# Patient Record
Sex: Female | Born: 1935 | Race: White | Hispanic: No | Marital: Married | Smoking: Former smoker
Health system: Southern US, Community
[De-identification: ages and names within clinical notes are randomized; demographics above are authoritative.]

## PROBLEM LIST (undated history)

## (undated) DIAGNOSIS — R079 Chest pain, unspecified: Secondary | ICD-10-CM

## (undated) DIAGNOSIS — K9419 Other complications of enterostomy: Secondary | ICD-10-CM

## (undated) DIAGNOSIS — M1990 Unspecified osteoarthritis, unspecified site: Secondary | ICD-10-CM

## (undated) DIAGNOSIS — M069 Rheumatoid arthritis, unspecified: Secondary | ICD-10-CM

## (undated) DIAGNOSIS — E7800 Pure hypercholesterolemia, unspecified: Secondary | ICD-10-CM

## (undated) DIAGNOSIS — I2510 Atherosclerotic heart disease of native coronary artery without angina pectoris: Secondary | ICD-10-CM

## (undated) DIAGNOSIS — M519 Unspecified thoracic, thoracolumbar and lumbosacral intervertebral disc disorder: Secondary | ICD-10-CM

## (undated) DIAGNOSIS — J189 Pneumonia, unspecified organism: Secondary | ICD-10-CM

## (undated) DIAGNOSIS — F32A Depression, unspecified: Secondary | ICD-10-CM

## (undated) DIAGNOSIS — IMO0001 Reserved for inherently not codable concepts without codable children: Secondary | ICD-10-CM

## (undated) DIAGNOSIS — K219 Gastro-esophageal reflux disease without esophagitis: Secondary | ICD-10-CM

## (undated) DIAGNOSIS — I10 Essential (primary) hypertension: Secondary | ICD-10-CM

## (undated) DIAGNOSIS — N390 Urinary tract infection, site not specified: Secondary | ICD-10-CM

## (undated) DIAGNOSIS — F329 Major depressive disorder, single episode, unspecified: Secondary | ICD-10-CM

## (undated) DIAGNOSIS — Z5189 Encounter for other specified aftercare: Secondary | ICD-10-CM

## (undated) DIAGNOSIS — T8092XA Unspecified transfusion reaction, initial encounter: Secondary | ICD-10-CM

## (undated) DIAGNOSIS — R0602 Shortness of breath: Secondary | ICD-10-CM

## (undated) DIAGNOSIS — D649 Anemia, unspecified: Secondary | ICD-10-CM

## (undated) DIAGNOSIS — I739 Peripheral vascular disease, unspecified: Secondary | ICD-10-CM

## (undated) DIAGNOSIS — Z8719 Personal history of other diseases of the digestive system: Secondary | ICD-10-CM

## (undated) DIAGNOSIS — S82402A Unspecified fracture of shaft of left fibula, initial encounter for closed fracture: Secondary | ICD-10-CM

## (undated) DIAGNOSIS — T4145XA Adverse effect of unspecified anesthetic, initial encounter: Secondary | ICD-10-CM

## (undated) DIAGNOSIS — E1340 Other specified diabetes mellitus with diabetic neuropathy, unspecified: Secondary | ICD-10-CM

## (undated) DIAGNOSIS — F419 Anxiety disorder, unspecified: Secondary | ICD-10-CM

## (undated) HISTORY — DX: Chest pain, unspecified: R07.9

## (undated) HISTORY — PX: COLONOSCOPY: SHX174

## (undated) HISTORY — PX: CARDIAC CATHETERIZATION: SHX172

## (undated) HISTORY — PX: DILATION AND CURETTAGE OF UTERUS: SHX78

## (undated) HISTORY — PX: ABDOMINAL HYSTERECTOMY: SHX81

## (undated) HISTORY — PX: CATARACT EXTRACTION W/ INTRAOCULAR LENS  IMPLANT, BILATERAL: SHX1307

## (undated) HISTORY — PX: TONSILLECTOMY: SUR1361

## (undated) HISTORY — PX: BACK SURGERY: SHX140

## (undated) NOTE — *Deleted (*Deleted)
DEATH SUMMARY   Patient Details  Name: Patricia Villegas MRN: [id number] DOB: 02-Feb-1935  Admission/Discharge Information   Admit Date:  08/09/2020  Date of Death: Date of Death: 26-Aug-2020  Time of Death: Time of Death: 1600  Length of Stay: February 22, 2023  Referring Physician: Gaspar Garbe, MD   Reason(s) for Hospitalization  {Trauma:[id number]::"***"}  Diagnoses  Preliminary cause of death:  Secondary Diagnoses (including complications and co-morbidities):  Principal Problem:   Rhabdomyolysis Active Problems:   Essential hypertension   Mixed hyperlipidemia   Peripheral vascular disease (HCC)   CAD (coronary artery disease) of artery bypass graft   Rheumatoid arthritis (HCC)   Chronic diastolic CHF (congestive heart failure) (HCC)   Type 2 diabetes mellitus with diabetic polyneuropathy, with long-term current use of insulin (HCC)   Myositis   Constipation   LFT elevation   Malnutrition of moderate degree   Diffuse pain   Severe muscle deconditioning   Goals of care, counseling/discussion   End of life care   DNR (do not resuscitate)   Palliative care by specialist   Dyspnea   Brief Hospital Course (including significant findings, care, treatment, and services provided and events leading to death)  Patricia Villegas is a 86 y.o. year old female who ***    Pertinent Labs and Studies  Significant Diagnostic Studies CT ABDOMEN PELVIS W CONTRAST  Result Date: 07/16/2020 CLINICAL DATA:  Anemia EXAM: CT ABDOMEN AND PELVIS WITH CONTRAST TECHNIQUE: Multidetector CT imaging of the abdomen and pelvis was performed using the standard protocol following bolus administration of intravenous contrast. CONTRAST:  OMNIPAQUE IOHEXOL 300 MG/ML  SOLN COMPARISON:  07/16/2020, 07/12/2020 FINDINGS: Lower chest: There are small bilateral pleural effusions. New right lower lobe airspace disease which could reflect aspiration or pneumonia. Minimal dependent lower lobe atelectasis. Dense calcification  of the mitral valve unchanged. Hepatobiliary: Calcified gallstones are again identified without cholecystitis. Liver is unremarkable. Pancreas: Unremarkable. No pancreatic ductal dilatation or surrounding inflammatory changes. Spleen: Normal in size without focal abnormality. Adrenals/Urinary Tract: Stable right renal cyst. Otherwise the kidneys enhance normally. The adrenals and bladder are unremarkable. Stomach/Bowel: Dense oral contrast is seen throughout the colon. No bowel obstruction or ileus. No wall thickening or inflammatory change. Vascular/Lymphatic: Aortic atherosclerosis. No enlarged abdominal or pelvic lymph nodes. Reproductive: Status post hysterectomy. No adnexal masses. Other: No free fluid or free gas.  No abdominal wall hernia. Musculoskeletal: Chronic compression deformities are seen at T11 and L1. Stable postsurgical changes at the L4/L5 level. Bilateral hip arthroplasties are stable. There are no acute bony abnormalities. Reconstructed images demonstrate no additional findings. IMPRESSION: 1. Right lower lobe airspace disease which could reflect infection or aspiration. 2. Stable small bilateral pleural effusions. 3. Cholelithiasis without cholecystitis. 4.  Aortic Atherosclerosis (ICD10-I70.0). Electronically Signed   By: Sharlet Salina M.D.   On: 07/16/2020 23:42   CT ABDOMEN PELVIS W CONTRAST  Result Date: 07/12/2020 CLINICAL DATA:  Abdominal distention EXAM: CT ABDOMEN AND PELVIS WITH CONTRAST TECHNIQUE: Multidetector CT imaging of the abdomen and pelvis was performed using the standard protocol following bolus administration of intravenous contrast. CONTRAST:  OMNIPAQUE IOHEXOL 300 MG/ML  SOLN COMPARISON:  None. FINDINGS: Lower chest: Trace bilateral pleural effusions. Dependent atelectasis. Densely calcified mitral valve. Hepatobiliary: Small gallstones layering within the gallbladder. No focal hepatic abnormality. Pancreas: No focal abnormality or ductal dilatation. Spleen: No  focal abnormality.  Normal size. Adrenals/Urinary Tract: Small cyst off the upper pole of the right kidney. No hydronephrosis. No renal or ureteral  stones. Urinary bladder and adrenal glands unremarkable. Stomach/Bowel: Few scattered left colonic diverticula. No active diverticulitis. Stomach and small bowel decompressed. No bowel obstruction. Vascular/Lymphatic: Aortic atherosclerosis. No evidence of aneurysm or adenopathy. Reproductive: Prior hysterectomy.  No adnexal masses. Other: No free fluid or free air. Musculoskeletal: Bilateral hip replacements. Postoperative changes in the lower lumbar spine. Mild compression fracture through the inferior endplate of L1 and superior endplate of T11. IMPRESSION: Trace bilateral pleural effusions.  Bibasilar atelectasis. Cholelithiasis. Aortic atherosclerosis. No acute findings in the abdomen or pelvis. Electronically Signed   By: Charlett Nose M.D.   On: 07/12/2020 02:14   VAS [place] IVC/ILIAC (VENOUS ONLY)  Result Date: 07/03/2020 IVC/ILIAC STUDY Limitations: Air/bowel gas.  Comparison Study: 06/09/2020- negative right lower extremity venous duplex Performing Technologist: Gertie Fey RDMS, RVT, RDCS  Examination Guidelines: A complete evaluation includes B-mode imaging, spectral Doppler, color Doppler, and power Doppler as needed of all accessible portions of each vessel. Bilateral testing is considered an integral part of a complete examination. Limited examinations for reoccurring indications may be performed as noted.  +----------------+---------+-----------+---------+-----------+--------+       CIV       RT-PatentRT-ThrombusLT-PatentLT-ThrombusComments +----------------+---------+-----------+---------+-----------+--------+ Common Iliac Mid patent              patent                      +----------------+---------+-----------+---------+-----------+--------+  +-----------------------+---------+-----------+---------+-----------+--------+            EIV          RT-PatentRT-ThrombusLT-PatentLT-ThrombusComments +-----------------------+---------+-----------+---------+-----------+--------+ External Iliac Vein Mid patent              patent                      +-----------------------+---------+-----------+---------+-----------+--------+   Summary: IVC/Iliac: No evidence of thrombus in common and external iliac veins. Unable to visualize IVC.  *See table(s) above for measurements and observations.  Electronically signed by Fabienne Bruns MD on 07/03/2020 at 6:20:18 PM.   Final    DG CHEST PORT 1 VIEW  Result Date: 07/19/2020 CLINICAL DATA:  Shortness of breath. EXAM: PORTABLE CHEST 1 VIEW COMPARISON:  November 9 21 FINDINGS: Similar cardiomediastinal silhouette. Postsurgical change with median sternotomy. Aortic atherosclerosis. Similar positioning of the right PICC. Enteric tube courses below the diaphragm and outside the field of view. Similar right basilar airspace opacities. No acute osseous abnormality. IMPRESSION: Similar right basilar airspace opacities, compatible with pneumonia and/or aspiration. Electronically Signed   By: Feliberto Harts MD   On: 07/19/2020 10:11   DG CHEST PORT 1 VIEW  Result Date: 07/18/2020 CLINICAL DATA:  Shortness of breath EXAM: PORTABLE CHEST 1 VIEW COMPARISON:  07/15/2020 FINDINGS: Cardiac shadow is stable. Postsurgical changes are noted. Aortic calcifications are seen. Feeding catheter extends at least into the distal stomach. Significant increased airspace opacity is noted in the right base consistent with acute pneumonia. No sizable effusion is seen. No bony abnormality is noted. IMPRESSION: Findings consistent with acute right basilar pneumonia. Electronically Signed   By: Alcide Clever M.D.   On: 07/18/2020 13:57   DG CHEST PORT 1 VIEW  Result Date: 07/15/2020 CLINICAL DATA:  Short of breath. EXAM: PORTABLE CHEST 1 VIEW COMPARISON:  07/13/2020 FINDINGS: Previous median sternotomy and CABG  procedure. Aortic atherosclerotic calcifications. Mitral annular calcifications. Bilateral pleural effusions are noted, right greater than left. Pulmonary vascular congestion and trace edema. No airspace densities. IMPRESSION: 1. Mild congestive heart failure. 2.  Aortic Atherosclerosis (ICD10-I70.0).  Electronically Signed   By: Signa Kell M.D.   On: 07/15/2020 11:19   DG CHEST PORT 1 VIEW  Result Date: 07/13/2020 CLINICAL DATA:  Shortness of breath.  Weakness. EXAM: PORTABLE CHEST 1 VIEW COMPARISON:  07/01/2020 FINDINGS: Midline trachea. Mild cardiomegaly. Atherosclerosis in the transverse aorta. Mitral annular calcifications. Median sternotomy for CABG. No pleural fluid. Biapical pleuroparenchymal scarring. No congestive failure. Mild scarring or atelectasis at the lateral left lung base. IMPRESSION: Cardiomegaly, without congestive failure. Aortic Atherosclerosis (ICD10-I70.0). Electronically Signed   By: Jeronimo Greaves M.D.   On: 07/13/2020 10:41   DG Abd Portable 1V  Result Date: 07/18/2020 CLINICAL DATA:  Constipation, check feeding catheter placement EXAM: PORTABLE ABDOMEN - 1 VIEW COMPARISON:  07/17/2019 FINDINGS: Feeding catheter is noted within the distal stomach/proximal duodenum. Contrast material is noted throughout the colon similar to that seen on the prior exam. Postsurgical changes are noted. No significant findings of constipation are seen. No free air is noted. IMPRESSION: Feeding catheter as described. No other focal abnormality is seen. Electronically Signed   By: Alcide Clever M.D.   On: 07/18/2020 13:58   DG Abd Portable 1V  Result Date: 07/16/2020 CLINICAL DATA:  Constipation EXAM: PORTABLE ABDOMEN - 1 VIEW COMPARISON:  CT dated July 12, 2020 FINDINGS: Oral contrast is noted in the colon. The bowel gas pattern is nonobstructive. There are degenerative changes of the spine. The patient is status post prior total hip arthroplasty. IMPRESSION: Oral contrast is noted in the colon.  Nonobstructive bowel gas pattern. Electronically Signed   By: Katherine Mantle M.D.   On: 07/16/2020 18:57   DG Swallowing Func-Speech Pathology  Result Date: 07/13/2020 Objective Swallowing Evaluation: Type of Study: MBS-Modified Barium Swallow Study  Patient Details Name: ELLAR HAKALA MRN: [id number] Date of Birth: 05-06-1935 Today's Date: 07/13/2020 Time: SLP Start Time (ACUTE ONLY): 1232 -SLP Stop Time (ACUTE ONLY): 1253 SLP Time Calculation (min) (ACUTE ONLY): 21 min Past Medical History: Past Medical History: Diagnosis Date . Anemia  . Anemia 06/14/2020 . Anxiety  . Arthritis  . Blood transfusion  . Chest pain   a. Lexiscan Myoview 12/12:  EF 75%, no ischemia or scar;  b.  Echo 12/12:  EF 60-65%, no sig valvular abnormalities  . Closed left fibular fracture   Casted  . Complication of anesthesia 1998  Unable to arouse fully, respiratory depression, pt. reports that the family was told that she would need a trach., but then she stabilized & didn't need it  . Coronary artery disease   sees Dr Shirlee Latch every 6 months . Depression  . Diabetes mellitus   Greater than 20 years type 2  . GERD (gastroesophageal reflux disease)  . H/O hiatal hernia  . Hypercholesteremia  . Hypertension   Greater than 20 years . Lumbar disc disease   S/p surgery x4;  Sciatica  . Neuropathy due to secondary diabetes (HCC)   Hx: of . Parastomal hernia of ileal conduit 2009 . Pneumonia   s/p CABG . [name] (peripheral vascular disease) (HCC)   Stable claudication. Arteriogram 07/2009 no significant aortoiliac disease, has bilateral SFA occlusions with distal reconstitution  . Rheumatoid arthritis (HCC)   Dr Orlin Hilding @ [facility] . Transfusion reaction 1960  Convulsions (? seizure) . UTI (lower urinary tract infection) 01/03/15 Past Surgical History: Past Surgical History: Procedure Laterality Date . ABDOMINAL HYSTERECTOMY   . BACK SURGERY    4 back operations most recently 1995, 1998 . BIOPSY  06/17/2020  Procedure: BIOPSY;  Surgeon: Benancio Deeds, MD;  Location: [facility] ENDOSCOPY;  Service: Gastroenterology;; . CARDIAC CATHETERIZATION    Hx: of 05/14/13 . CATARACT EXTRACTION W/ INTRAOCULAR LENS  IMPLANT, BILATERAL    Hx: of . COLONOSCOPY    Hx; of . COLONOSCOPY WITH PROPOFOL N/A 06/17/2020  Procedure: COLONOSCOPY WITH PROPOFOL;  Surgeon: Benancio Deeds, MD;  Location: [facility] ENDOSCOPY;  Service: Gastroenterology;  Laterality: N/A; . CORONARY ARTERY BYPASS GRAFT N/A 05/24/2013  Procedure: CORONARY ARTERY BYPASS GRAFTING (CABG);  Surgeon: Loreli Slot, MD;  Location: [facility] OR;  Service: Open Heart Surgery;  Laterality: N/A; . DILATION AND CURETTAGE OF UTERUS   . ESOPHAGOGASTRODUODENOSCOPY (EGD) WITH PROPOFOL N/A 06/17/2020  Procedure: ESOPHAGOGASTRODUODENOSCOPY (EGD) WITH PROPOFOL;  Surgeon: Benancio Deeds, MD;  Location: [facility] ENDOSCOPY;  Service: Gastroenterology;  Laterality: N/A; . HEMOSTASIS CLIP PLACEMENT  06/17/2020  Procedure: HEMOSTASIS CLIP PLACEMENT;  Surgeon: Benancio Deeds, MD;  Location: [facility] ENDOSCOPY;  Service: Gastroenterology;; . HERNIA REPAIR  2009 . LEFT HEART CATHETERIZATION WITH CORONARY ANGIOGRAM N/A 05/14/2013  Procedure: LEFT HEART CATHETERIZATION WITH CORONARY ANGIOGRAM;  Surgeon: Laurey Morale, MD;  Location: [facility] CATH LAB;  Service: Cardiovascular;  Laterality: N/A; . POLYPECTOMY  06/17/2020  Procedure: POLYPECTOMY;  Surgeon: Benancio Deeds, MD;  Location: [facility] ENDOSCOPY;  Service: Gastroenterology;; . TONSILLECTOMY   . TOTAL HIP ARTHROPLASTY Left 01/10/2015  Procedure: TOTAL HIP ARTHROPLASTY ANTERIOR APPROACH;  Surgeon: Marcene Corning, MD;  Location: [facility] OR;  Service: Orthopedics;  Laterality: Left; . TOTAL HIP ARTHROPLASTY Right 05/30/2020  Procedure: RIGHT TOTAL HIP ARTHROPLASTY ANTERIOR APPROACH;  Surgeon: Marcene Corning, MD;  Location: [facility] ORS;  Service: Orthopedics;  Laterality: Right; HPI: Pt is an 74 y.o. female with medical history significant of RA, [name], DM, HTN, [name], and CAD s/p CABG who  presented with leg pain.  She had physical therapy sessions and became very weak came back to the hospital was found to have a bleeding ulcer and a DVT. Per neurology's note, pt has had an insidious slow progressive onset of weakness for 3-4 weeks concerning for myopathy. SLP was consulted since dysphagia may be related to myopathy.  No data recorded Assessment / Plan / Recommendation CHL IP CLINICAL IMPRESSIONS 07/13/2020 Clinical Impression Pt's positioning was suboptimal due to her inability to hold her head in an upright position d/t reported weakness. Without physical assistance from SLP and radiology tech, pt maintained a chin tuck posture. Pt exhibited difficulty with bolus propulsion, increased pharyngeal residue, and absent epiglottic inversion in this position, all increasing aspiration risk. Pt presented with oropharyngeal dysphagia characterized by reduced lingual retraction, reduced anterior laryngeal movement, reduced pharyngeal constriction, and aspiration (PAS 7) of nectar thick liquids secondary to spillover of pyriform sinus residue and delayed swallow initiation. Laryngeal sensation was adequate but coughing was ineffective. She demonstrated moderate vallecular residue, mild posterior pharyngeal wall residue, and mild-moderate pyriform sinus residue. A barium tablet was attempted, but pt refused stating that she needs her pills to be crushed since she is fearful of "choking" on them. Pt stated that she needs her meats to be cut "smaller than a fingernail" for her to be able to masticate them. Pt's diet will be downgraded to dysphagia 2 solids and thin liquids. 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 With support to keep her head in an upright position, her swallow function was improved and risk reduced. The results of the study  and the potential use a soft cervical collar  to help support pt's neck, and therefore facilitate use of an upright head position, were discussed with Dr. Allena Katz and he indicated that he will order it. SLP will follow pt. SLP Visit Diagnosis Dysphagia, unspecified (R13.10) Attention and concentration deficit following -- Frontal lobe and executive function deficit following -- Impact on safety and function Mild aspiration risk   CHL IP TREATMENT RECOMMENDATION 07/13/2020 Treatment Recommendations Therapy as outlined in treatment plan below   Prognosis 07/13/2020 Prognosis for Safe Diet Advancement Good Barriers to Reach Goals Severity of deficits Barriers/Prognosis Comment -- CHL IP DIET RECOMMENDATION 07/13/2020 SLP Diet Recommendations Dysphagia 2 (Fine chop) solids;Thin liquid Liquid Administration via Cup;Straw Medication Administration Crushed with puree Compensations Slow rate;Small sips/bites Postural Changes Seated upright at 90 degrees   CHL IP OTHER RECOMMENDATIONS 07/13/2020 Recommended Consults -- Oral Care Recommendations Oral care BID;Staff/trained caregiver to provide oral care Other Recommendations --   CHL IP FOLLOW UP RECOMMENDATIONS 07/13/2020 Follow up Recommendations (No Data)   CHL IP FREQUENCY AND DURATION 07/13/2020 Speech Therapy Frequency (ACUTE ONLY) min 2x/week Treatment Duration 2 weeks      CHL IP ORAL PHASE 07/13/2020 Oral Phase Impaired Oral - Pudding Teaspoon -- Oral - Pudding Cup -- Oral - Honey Teaspoon -- Oral - Honey Cup Reduced posterior propulsion Oral - Nectar Teaspoon -- Oral - Nectar Cup Reduced posterior propulsion Oral - Nectar Straw Reduced posterior propulsion Oral - Thin Teaspoon -- Oral - Thin Cup -- Oral - Thin Straw Reduced posterior propulsion Oral - Puree Reduced posterior propulsion Oral - Mech Soft Reduced posterior propulsion Oral - Regular Reduced posterior propulsion Oral - Multi-Consistency -- Oral - Pill -- Oral Phase - Comment --  CHL IP PHARYNGEAL PHASE 07/13/2020 Pharyngeal Phase Impaired Pharyngeal- Pudding  Teaspoon -- Pharyngeal -- Pharyngeal- Pudding Cup -- Pharyngeal -- Pharyngeal- Honey Teaspoon -- Pharyngeal -- Pharyngeal- Honey Cup Pharyngeal residue - valleculae;Pharyngeal residue - pyriform;Pharyngeal residue - posterior pharnyx;Reduced anterior laryngeal mobility;Reduced epiglottic inversion Pharyngeal -- Pharyngeal- Nectar Teaspoon -- Pharyngeal -- Pharyngeal- Nectar Cup -- Pharyngeal -- Pharyngeal- Nectar Straw Pharyngeal residue - valleculae;Pharyngeal residue - pyriform;Pharyngeal residue - posterior pharnyx;Reduced anterior laryngeal mobility;Reduced epiglottic inversion;Moderate aspiration;Penetration/Aspiration during swallow;Penetration/Apiration after swallow Pharyngeal Material enters airway, passes BELOW cords and not ejected out despite cough attempt by patient Pharyngeal- Thin Teaspoon -- Pharyngeal -- Pharyngeal- Thin Cup -- Pharyngeal -- Pharyngeal- Thin Straw Pharyngeal residue - valleculae;Pharyngeal residue - pyriform;Pharyngeal residue - posterior pharnyx;Reduced anterior laryngeal mobility;Reduced epiglottic inversion Pharyngeal -- Pharyngeal- Puree Pharyngeal residue - valleculae;Pharyngeal residue - pyriform;Pharyngeal residue - posterior pharnyx;Reduced anterior laryngeal mobility;Reduced epiglottic inversion Pharyngeal -- Pharyngeal- Mechanical Soft Pharyngeal residue - valleculae;Pharyngeal residue - pyriform;Pharyngeal residue - posterior pharnyx;Reduced anterior laryngeal mobility;Reduced epiglottic inversion Pharyngeal -- Pharyngeal- Regular Pharyngeal residue - valleculae;Pharyngeal residue - pyriform;Pharyngeal residue - posterior pharnyx;Reduced anterior laryngeal mobility;Reduced epiglottic inversion Pharyngeal -- Pharyngeal- Multi-consistency -- Pharyngeal -- Pharyngeal- Pill -- Pharyngeal -- Pharyngeal Comment --  Shanika I. Vear Clock, MS, CCC-SLP Acute Rehabilitation Services Office number [phone] Pager [phone] No flowsheet data found. Scheryl Marten 07/13/2020,  3:11 PM              US LIVER DOPPLER  Result Date: 07/13/2020 CLINICAL DATA:  Elevated LFTs EXAM: DUPLEX ULTRASOUND OF LIVER TECHNIQUE: Color and duplex Doppler ultrasound was performed to evaluate the hepatic in-flow and out-flow vessels. COMPARISON:  07/12/2020 FINDINGS: Liver: Normal parenchymal echogenicity. Normal hepatic contour without nodularity. No focal lesion, mass or intrahepatic biliary ductal dilatation. Main Portal  Vein size: 1.0 cm Portal Vein Velocities Main Prox:  54 cm/sec Main Mid: 51 cm/sec Main Dist:  45 cm/sec Right: 34 cm/sec Left: 22 cm/sec Hepatic Vein Velocities Right:  22 cm/sec Middle:  32 cm/sec Left:  40 cm/sec IVC: Present and patent with normal respiratory phasicity. Hepatic Artery Velocity:  57 cm/sec Splenic Vein Velocity:  19 cm/sec Spleen: 2.6 cm x 6.8 cm x 7.4 cm with a total volume of 70 cm^3 (411 cm^3 is upper limit normal) Portal Vein Occlusion/Thrombus: No Splenic Vein Occlusion/Thrombus: No Ascites: None Varices: None Patent portal, hepatic and splenic veins with directional flow. Negative portal vein occlusion or thrombus. No ascites. Small left effusion noted. IMPRESSION: Normal hepatic venous Doppler. Electronically Signed   By: Judie Petit.  Shick M.D.   On: 07/13/2020 08:24   VAS [place] LOWER EXTREMITY VENOUS (DVT)  Result Date: 07/12/2020  Lower Venous DVT Study Indications: Follow up dvt.  Comparison Study: 07/04/20 previous Performing Technologist: Blanch Media RVS  Examination Guidelines: A complete evaluation includes B-mode imaging, spectral Doppler, color Doppler, and power Doppler as needed of all accessible portions of each vessel. Bilateral testing is considered an integral part of a complete examination. Limited examinations for reoccurring indications may be performed as noted. The reflux portion of the exam is performed with the patient in reverse Trendelenburg.  +---------+---------------+---------+-----------+----------+--------------+ RIGHT     CompressibilityPhasicitySpontaneityPropertiesThrombus Aging +---------+---------------+---------+-----------+----------+--------------+ CFV      Full           Yes      Yes                                 +---------+---------------+---------+-----------+----------+--------------+ SFJ      Full                                                        +---------+---------------+---------+-----------+----------+--------------+ FV Prox  Full                                                        +---------+---------------+---------+-----------+----------+--------------+ FV Mid   Full                                                        +---------+---------------+---------+-----------+----------+--------------+ FV DistalFull                                                        +---------+---------------+---------+-----------+----------+--------------+ PFV      Full                                                        +---------+---------------+---------+-----------+----------+--------------+ POP  Full           Yes      Yes                                 +---------+---------------+---------+-----------+----------+--------------+ PTV      Full                                                        +---------+---------------+---------+-----------+----------+--------------+ PERO     Full                                                        +---------+---------------+---------+-----------+----------+--------------+   +---------+---------------+---------+-----------+----------+-----------------+ LEFT     CompressibilityPhasicitySpontaneityPropertiesThrombus Aging    +---------+---------------+---------+-----------+----------+-----------------+ CFV      Full           Yes      Yes                                    +---------+---------------+---------+-----------+----------+-----------------+ SFJ      Full                                                            +---------+---------------+---------+-----------+----------+-----------------+ FV Prox  Full                                                           +---------+---------------+---------+-----------+----------+-----------------+ FV Mid   Full                                                           +---------+---------------+---------+-----------+----------+-----------------+ FV DistalFull                                                           +---------+---------------+---------+-----------+----------+-----------------+ PFV      Full                                                           +---------+---------------+---------+-----------+----------+-----------------+ POP      Full           Yes      Yes                                    +---------+---------------+---------+-----------+----------+-----------------+  PTV      None                                         Age Indeterminate +---------+---------------+---------+-----------+----------+-----------------+ PERO     None                                         Age Indeterminate +---------+---------------+---------+-----------+----------+-----------------+ Gastroc  None                                         Age Indeterminate +---------+---------------+---------+-----------+----------+-----------------+     Summary: RIGHT: - There is no evidence of deep vein thrombosis in the lower extremity.  - No cystic structure found in the popliteal fossa.  LEFT: - Findings consistent with age indeterminate deep vein thrombosis involving the left posterior tibial veins, left peroneal veins, and left gastrocnemius veins. - No cystic structure found in the popliteal fossa.  *See table(s) above for measurements and observations. Electronically signed by Gretta Began MD on 07/12/2020 at 3:05:07 PM.    Final    VAS [place] LOWER EXTREMITY VENOUS (DVT)  Result Date: 07/04/2020  Lower  Venous DVTStudy Indications: Pain, and Swelling.  Limitations: Poor ultrasound/tissue interface. Comparison Study: 06/09/2020- negative lower extremity venosu duplex Performing Technologist: Gertie Fey MHA, RDMS, RVT, RDCS  Examination Guidelines: A complete evaluation includes B-mode imaging, spectral Doppler, color Doppler, and power Doppler as needed of all accessible portions of each vessel. Bilateral testing is considered an integral part of a complete examination. Limited examinations for reoccurring indications may be performed as noted. The reflux portion of the exam is performed with the patient in reverse Trendelenburg.  +---------+---------------+---------+-----------+----------+--------------+ RIGHT    CompressibilityPhasicitySpontaneityPropertiesThrombus Aging +---------+---------------+---------+-----------+----------+--------------+ CFV      Full           Yes      Yes                                 +---------+---------------+---------+-----------+----------+--------------+ SFJ      Full                                                        +---------+---------------+---------+-----------+----------+--------------+ FV Prox  Full                                                        +---------+---------------+---------+-----------+----------+--------------+ FV Mid   Full                                                        +---------+---------------+---------+-----------+----------+--------------+ FV DistalFull                                                        +---------+---------------+---------+-----------+----------+--------------+  PFV      Full                                                        +---------+---------------+---------+-----------+----------+--------------+ POP      Full           Yes      Yes                                 +---------+---------------+---------+-----------+----------+--------------+ PTV       Full                                                        +---------+---------------+---------+-----------+----------+--------------+   Right Technical Findings: Not visualized segments include peroneal veins.  +---------+---------------+---------+-----------+---------------+--------------+ LEFT     CompressibilityPhasicitySpontaneityProperties     Thrombus Aging +---------+---------------+---------+-----------+---------------+--------------+ CFV      Full           Yes      Yes                                      +---------+---------------+---------+-----------+---------------+--------------+ SFJ      Full                                                             +---------+---------------+---------+-----------+---------------+--------------+ FV Prox  Full                                                             +---------+---------------+---------+-----------+---------------+--------------+ FV Mid   Full                                                             +---------+---------------+---------+-----------+---------------+--------------+ FV Distal                        Yes        Patent by color                                                           Doppler                       +---------+---------------+---------+-----------+---------------+--------------+ PFV      Full                                                             +---------+---------------+---------+-----------+---------------+--------------+  POP      Full           Yes      Yes                                      +---------+---------------+---------+-----------+---------------+--------------+ PTV      None                    No                        Acute          +---------+---------------+---------+-----------+---------------+--------------+ PERO     None                    No                        Acute           +---------+---------------+---------+-----------+---------------+--------------+ Gastroc  None                    No                        Acute          +---------+---------------+---------+-----------+---------------+--------------+     Summary: RIGHT: - There is no evidence of deep vein thrombosis in the lower extremity. However, portions of this examination were limited- see technologist comments above.  - No cystic structure found in the popliteal fossa.  LEFT: - Findings consistent with acute deep vein thrombosis involving the left posterior tibial veins, left peroneal veins, and left gastrocnemius veins. - No cystic structure found in the popliteal fossa.  *See table(s) above for measurements and observations. Electronically signed by Waverly Ferrari MD on 07/04/2020 at 5:41:20 PM.    Final    [place] EKG SITE RITE  Result Date: 07/15/2020 If Site Rite image not attached, placement could not be confirmed due to current cardiac rhythm.   Microbiology No results found for this or any previous visit (from the past 240 hour(s)).  Lab Basic Metabolic Panel: No results for input(s): NA, K, CL, CO2, GLUCOSE, BUN, CREATININE, CALCIUM, MG, PHOS in the last 168 hours. Liver Function Tests: No results for input(s): AST, ALT, ALKPHOS, BILITOT, PROT, ALBUMIN in the last 168 hours. No results for input(s): LIPASE, AMYLASE in the last 168 hours. No results for input(s): AMMONIA in the last 168 hours. CBC: No results for input(s): WBC, NEUTROABS, HGB, HCT, MCV, PLT in the last 168 hours. Cardiac Enzymes: No results for input(s): CKTOTAL, CKMB, CKMBINDEX, TROPONINI in the last 168 hours. Sepsis Labs: No results for input(s): PROCALCITON, WBC, LATICACIDVEN in the last 168 hours.  Procedures/Operations  ***   Jehanzeb Memon 08/01/2020, 3:51 PM

---

## 1958-09-09 DIAGNOSIS — T8092XA Unspecified transfusion reaction, initial encounter: Secondary | ICD-10-CM

## 1958-09-09 HISTORY — DX: Unspecified transfusion reaction, initial encounter: T80.92XA

## 1996-09-09 DIAGNOSIS — T8859XA Other complications of anesthesia, initial encounter: Secondary | ICD-10-CM

## 1996-09-09 HISTORY — DX: Other complications of anesthesia, initial encounter: T88.59XA

## 1998-03-08 ENCOUNTER — Encounter: Admission: RE | Admit: 1998-03-08 | Discharge: 1998-06-06 | Payer: Self-pay | Admitting: Neurological Surgery

## 2007-05-07 ENCOUNTER — Encounter: Admission: RE | Admit: 2007-05-07 | Discharge: 2007-05-07 | Payer: Self-pay | Admitting: Internal Medicine

## 2007-05-08 ENCOUNTER — Encounter: Admission: RE | Admit: 2007-05-08 | Discharge: 2007-05-08 | Payer: Self-pay | Admitting: Internal Medicine

## 2007-09-10 DIAGNOSIS — K435 Parastomal hernia without obstruction or  gangrene: Secondary | ICD-10-CM

## 2007-09-10 HISTORY — DX: Parastomal hernia without obstruction or gangrene: K43.5

## 2007-09-10 HISTORY — PX: HERNIA REPAIR: SHX51

## 2008-05-31 ENCOUNTER — Ambulatory Visit: Payer: Self-pay | Admitting: Vascular Surgery

## 2008-10-27 ENCOUNTER — Emergency Department (HOSPITAL_COMMUNITY): Admission: EM | Admit: 2008-10-27 | Discharge: 2008-10-27 | Payer: Self-pay | Admitting: Emergency Medicine

## 2009-01-10 ENCOUNTER — Ambulatory Visit (HOSPITAL_COMMUNITY): Admission: RE | Admit: 2009-01-10 | Discharge: 2009-01-10 | Payer: Self-pay | Admitting: Surgery

## 2009-06-29 ENCOUNTER — Ambulatory Visit: Payer: Self-pay | Admitting: Vascular Surgery

## 2009-07-17 ENCOUNTER — Ambulatory Visit: Payer: Self-pay | Admitting: Vascular Surgery

## 2009-07-17 ENCOUNTER — Ambulatory Visit (HOSPITAL_COMMUNITY): Admission: RE | Admit: 2009-07-17 | Discharge: 2009-07-18 | Payer: Self-pay | Admitting: Vascular Surgery

## 2009-08-10 ENCOUNTER — Ambulatory Visit: Payer: Self-pay | Admitting: Vascular Surgery

## 2009-12-04 ENCOUNTER — Emergency Department (HOSPITAL_COMMUNITY): Admission: EM | Admit: 2009-12-04 | Discharge: 2009-12-04 | Payer: Self-pay | Admitting: Family Medicine

## 2010-03-01 ENCOUNTER — Ambulatory Visit: Payer: Self-pay | Admitting: Vascular Surgery

## 2010-09-06 ENCOUNTER — Ambulatory Visit: Payer: Self-pay | Admitting: Vascular Surgery

## 2010-09-30 ENCOUNTER — Encounter: Payer: Self-pay | Admitting: Internal Medicine

## 2010-12-12 LAB — GLUCOSE, CAPILLARY
Glucose-Capillary: 120 mg/dL — ABNORMAL HIGH (ref 70–99)
Glucose-Capillary: 134 mg/dL — ABNORMAL HIGH (ref 70–99)
Glucose-Capillary: 277 mg/dL — ABNORMAL HIGH (ref 70–99)

## 2010-12-12 LAB — POCT I-STAT, CHEM 8
Calcium, Ion: 1.22 mmol/L (ref 1.12–1.32)
Creatinine, Ser: 0.8 mg/dL (ref 0.4–1.2)
HCT: 38 % (ref 36.0–46.0)
Hemoglobin: 12.9 g/dL (ref 12.0–15.0)

## 2010-12-19 LAB — COMPREHENSIVE METABOLIC PANEL
Alkaline Phosphatase: 52 U/L (ref 39–117)
BUN: 12 mg/dL (ref 6–23)
Glucose, Bld: 103 mg/dL — ABNORMAL HIGH (ref 70–99)
Potassium: 4.2 mEq/L (ref 3.5–5.1)

## 2010-12-19 LAB — DIFFERENTIAL
Basophils Absolute: 0 10*3/uL (ref 0.0–0.1)
Eosinophils Relative: 1 % (ref 0–5)
Lymphs Abs: 2.1 10*3/uL (ref 0.7–4.0)
Neutro Abs: 4 10*3/uL (ref 1.7–7.7)
Neutrophils Relative %: 60 % (ref 43–77)

## 2010-12-19 LAB — CBC
HCT: 36.7 % (ref 36.0–46.0)
MCV: 93.2 fL (ref 78.0–100.0)
Platelets: 211 10*3/uL (ref 150–400)
RBC: 3.93 MIL/uL (ref 3.87–5.11)
RDW: 12.9 % (ref 11.5–15.5)

## 2010-12-19 LAB — PROTIME-INR: Prothrombin Time: 13 seconds (ref 11.6–15.2)

## 2010-12-25 LAB — URINALYSIS, ROUTINE W REFLEX MICROSCOPIC
Bilirubin Urine: NEGATIVE
Glucose, UA: NEGATIVE mg/dL
Ketones, ur: NEGATIVE mg/dL
Nitrite: NEGATIVE
Specific Gravity, Urine: 1.025 (ref 1.005–1.030)
Urobilinogen, UA: 1 mg/dL (ref 0.0–1.0)
pH: 7 (ref 5.0–8.0)

## 2010-12-25 LAB — COMPREHENSIVE METABOLIC PANEL
AST: 23 U/L (ref 0–37)
Albumin: 4.2 g/dL (ref 3.5–5.2)
Alkaline Phosphatase: 53 U/L (ref 39–117)
BUN: 15 mg/dL (ref 6–23)
CO2: 26 mEq/L (ref 19–32)
Calcium: 9.2 mg/dL (ref 8.4–10.5)
Creatinine, Ser: 0.85 mg/dL (ref 0.4–1.2)
GFR calc Af Amer: >60 mL/min (ref >60)
Total Bilirubin: 0.7 mg/dL (ref 0.3–1.2)

## 2010-12-25 LAB — CBC
HCT: 38.2 % (ref 36.0–46.0)
Hemoglobin: 13.3 g/dL (ref 12.0–15.0)
MCHC: 34.9 g/dL (ref 30.0–36.0)
RBC: 4.07 MIL/uL (ref 3.87–5.11)
WBC: 9.2 10*3/uL (ref 4.0–10.5)

## 2010-12-25 LAB — URINE MICROSCOPIC-ADD ON

## 2010-12-25 LAB — DIFFERENTIAL: Basophils Relative: 0 % (ref 0–1)

## 2011-01-22 NOTE — Assessment & Plan Note (Signed)
OFFICE VISIT   Patricia Villegas, Patricia Villegas  DOB:  1935-07-28                                       08/10/2009  ZOXWR#:[id number]   I saw the patient in the office today for followup after her recent  arteriogram.  This is a pleasant 76 year old woman who I had most  recently seen in October of 2010 with pain in both legs.  She had  evidence of bilateral infrainguinal arterial occlusive disease.  I felt  that her symptoms were related both to her peripheral vascular disease  and also to her lumbar disk disease.  However, she wished to pursue  arteriography as she felt that her symptoms had progressed.  She  underwent an arteriogram on 07/17/2009 which showed no significant  aortoiliac occlusive disease.  She had bilateral superficial femoral  artery occlusions with reconstitution of the below knee popliteal artery  and two vessel runoff bilaterally.  She came in today to discuss these  results.  She continues to have pain in both legs.  She gets calf  claudication bilaterally which is relieved with rest.  However, she also  experiences a significant burning pain in both legs oftentimes simply  with sitting.  She has a history of significant lumbar disk disease and  has had 4 previous back operations, most recently in 1995 and 1998.   On examination blood pressure is 199/73, heart rate is 76.  She has  palpable femoral pulses.  I cannot palpate popliteal or pedal pulses.  Both feet are warm and adequately perfused.  She has no hematoma in the  right groin where she had her cath.   I explained to her that we do not see any options amenable to  angioplasty on arteriogram.  Revascularization would require fem-pop  bypass grafting.  However, again I explained to her that I did not think  that all her symptoms could be attributed to her peripheral vascular  disease and a significant part of her pain may be neurogenic pain  related to her lumbar disk disease.  I would not  favor fem-pop bypass  grafting at this point unless she developed rest pain or nonhealing  ulcer and she also is not in favor of surgery.   I will plan on seeing her back in 6 months.  She knows to call sooner if  she has problems.  I have encouraged her to stay as active as possible.  We also discussed the potential use of cilostazol and she will consider  this when she returns.   Di Kindle. Edilia Bo, M.D.  Electronically Signed   CSD/MEDQ  D:  08/10/2009  T:  08/11/2009  Job:  [id number]   cc:   Gaspar Garbe, M.D.

## 2011-01-22 NOTE — Consult Note (Signed)
VASCULAR SURGERY CONSULTATION   Patricia Villegas, Patricia Villegas  DOB:  1935/06/19                                       05/31/2008  GNFAO#:[id number]   I saw the patient in the office today in consultation for bilateral  lower extremity pain.  She was referred by Dr. Wylene Simmer.  This is a  pleasant 76 year old woman who has had paresthesias in her legs and feet  for over 2 years.  She has had four previous back operations and has  attributed her symptoms to nerve damage.  Her symptoms have gradually  worsened over the last 6 months.  This prompted an arterial Doppler  study done at [facility] and this showed ABIs of 30% bilaterally.  She was referred for a vascular consultation by Dr. Wylene Simmer.   She does admit to bilateral calf claudication which is slightly more  significant on the left side.  This occurs at a fairly short distance of  less than a block.  At the same time she notes, however, that her legs  hurt all the time.  She also describes symptoms consistent with rest  pain in both lower extremities.  She has had no history of nonhealing  ulcers.   PAST MEDICAL HISTORY:  Significant for adult onset diabetes.  She does  not require insulin.  In addition, she has hypertension and  hypercholesterolemia.  She denies any history of previous myocardial  infarction, history of congestive heart failure or history of COPD.   FAMILY HISTORY:  Her mother had diabetes as did her brother.  She is  unaware of any history of premature cardiovascular disease.  There is no  history of aneurysmal disease in her family that she is aware of.   SOCIAL HISTORY:  She is married.  She has two children.  She quit  tobacco in 1998.   REVIEW OF SYSTEMS AND MEDICATIONS:  Are documented on the medical  history form in her chart.   PHYSICAL EXAMINATION:  General:  This is a pleasant 76 year old woman  who appears her stated age.  Vital signs:  Blood pressure 182/76, heart  rate is 69.  I do  not detect any carotid bruits.  Neck:  Supple.  No  cervical lymphadenopathy.  Lungs:  Are clear bilaterally to  auscultation.  Cardiac:  She has a regular rate and rhythm.  Abdomen:  Soft and nontender.  She has normal pitched bowel sounds.  I cannot  palpate an aneurysm.  She has normal femoral pulses.  I cannot palpate  popliteal or pedal pulses on either side.  She has monophasic Doppler  signals in both feet.  She has no ischemic ulcers.  She has no  significant lower extremity swelling.  Neurological:  Exam shows good  strength in upper extremities and lower extremities bilaterally.   Doppler study done at [facility] shows ABIs of 30% bilaterally.   I have explained to the patient that I think her symptoms are at least  partly related to her peripheral vascular disease.  Based on her Doppler  study and physical examination she has evidence of infrainguinal  arterial occlusive disease bilaterally.  She likely has combined SFA and  tibial occlusive disease.  At the same time I think some of her symptoms  which occur with simply standing could be related to her previous back  problems.  We have discussed the option of proceeding with arteriography  to see what options she might have for revascularization.  Most likely  this would require an infrainguinal bypass and would not be something  amenable to angioplasty as she appears to have fairly diffuse  infrainguinal arterial occlusive disease.  However, the patient feels  that her symptoms at this point are tolerable and does not wish to  proceed with arteriography.  I do not think this is unreasonable.  Fortunately, she is not a smoker.  We have discussed the importance of  getting on a structured walking program to help maintain collateral  flow.  At this point I did not stress the use of cilostazol as she is on  quite a few medications as is and she just recently started Plavix.  I  will see her back at any time if her symptoms  progress and she would  like to proceed with arteriography or if any new vascular issues arise.   Di Kindle. Edilia Bo, M.D.  Electronically Signed  CSD/MEDQ  D:  05/31/2008  T:  06/01/2008  Job:  [id number]   cc:   Gaspar Garbe, M.D.

## 2011-01-22 NOTE — Assessment & Plan Note (Signed)
OFFICE VISIT   LASHENA, SIGNER  DOB:  05/30/1935                                       03/01/2010  ZOXWR#:[id number]   I saw the patient in the office today for continued follow-up of her  peripheral vascular disease.  I had seen her in October 2010 with  bilateral infrainguinal arterial occlusive disease.  She underwent  arteriogram done in November 2010 which showed no significant aortoiliac  occlusive disease.  She had bilateral superficial femoral artery  occlusions with reconstitution of the popliteal arteries bilaterally and  two vessel runoff bilaterally.  Her symptoms were quite tolerable at  that time and she did not wish to pursue revascularization so I have  been following her with stable claudication.  Of note since I saw her  last in December 2010 she did fracture her left fibula and has been in a  cast for 2 months and then was in a boot for a month.  She is continuing  to have some swelling in her left leg related to her fracture.  Because  of this her activities have been much more limited.  Prior to this she  had been ambulating daily and her symptoms had been gradually improving.  She denies any history of rest pain.  She has had no history of  nonhealing ulcers.  Her pain for the most part now is related to her  fracture and is fairly constant and relieved somewhat with elevation and  it worsens with activity and dependency.   PAST MEDICAL HISTORY:  Significant for diabetes which has been stable on  her current medications.  She also has hypertension and  hypercholesterolemia which are stable on her current medications and are  followed by Dr. Wylene Simmer.   SOCIAL HISTORY:  She is married.  She has two children.  She quit  tobacco in 1998.   PHYSICAL EXAMINATION:  This is a pleasant 76 year old woman who appears  her stated age.  Blood pressure is 150/69, heart rate is 59, saturation  98%.  Lungs are clear bilaterally to auscultation  without rales, rhonchi  or wheezing.  Cardiovascular examination:  I do not detect any carotid  bruits.  She has a regular rate and rhythm.  She has palpable femoral  pulses.  I cannot palpate popliteal or pedal pulses on either side.  She  has a moderate left ankle swelling.  Abdomen:  Soft and nontender with  normal pitched bowel sounds.  No masses appreciated.  Neurologic  examination:  She has no focal weakness or paresthesias.   I did independently interpret her arterial duplex Doppler study today  which shows an ABI of 93% on the right and 77% on the left.  She has a  biphasic dorsalis pedis signal on the right.  She has a monophasic  posterior tibial signal on the right.  She has monophasic posterior  tibial and dorsalis pedis signal on the left.   I think her peripheral vascular disease remains stable.  Hopefully when  she is recovered from her fracture she can get back on her structured  walking program.  Fortunately she is not a smoker.  We would only  consider revascularization if she developed disabling claudication, rest  pain, or nonhealing ulcer.  I plan on seeing her back in 6 months.  I  have ordered follow-up ABIs  at that time.  She knows to call sooner if  she has problems.  We have also discussed the importance of intermittent  leg elevation for her leg swelling related to her fracture.     Di Kindle. Edilia Bo, M.D.  Electronically Signed   CSD/MEDQ  D:  03/01/2010  T:  03/02/2010  Job:  [id number]   cc:   Gaspar Garbe, M.D.

## 2011-01-22 NOTE — Op Note (Signed)
NAMEBRIEANNE, MIGNONE                ACCOUNT NO.:  [id number]   MEDICAL RECORD NO.:  [id number]          PATIENT TYPE:  AMB   LOCATION:  SDS                          FACILITY:  [facility]   PHYSICIAN:  Thomas A. Cornett, M.D.DATE OF BIRTH:  April 18, 1935   DATE OF PROCEDURE:  DATE OF DISCHARGE:  01/10/2009                               OPERATIVE REPORT   PREOPERATIVE DIAGNOSIS:  Right inguinal hernia.   POSTOPERATIVE DIAGNOSIS:  Right inguinal hernia.   PROCEDURE:  Repair of right inguinal hernia with mesh.   SURGEON:  Maisie Fus A. Cornett, MD   ANESTHESIA:  General endotracheal anesthesia with 0.25% Sensorcaine  local.   ESTIMATED BLOOD LOSS:  10 mL.   SPECIMENS:  None.   INDICATIONS FOR PROCEDURE:  The patient is a 76 year old female who has  developed bulge in her right groin, which is painful.  On examination,  she had a reducible inguinal hernia that was repaired due to pain.  She  presents today for that.   DESCRIPTION OF PROCEDURE:  The patient was brought to the operating  room.  After induction of general anesthesia, she is placed supine on  table.  Right inguinal region was prepped and draped in a sterile  fashion.  Sensorcaine 0.25% with epinephrine was infiltrated in the  right inguinal crease.  An oblique incision was made.  Dissection was  carried down through Scarpa fascia until the aponeurosis of the external  oblique was identified.  I infiltrated this with 0.25% Sensorcaine as  well.  The fibers were opened in the direction of the fibers to expose  the inguinal canal.  I used my fingers to dissect around indirect hernia  that involved a round ligament.  Once the entire hernia was dissected  out, I divided the round ligament and reduced the hernia sac back into  the preperitoneal space.  I used a small piece of Ultrapro mesh.  I cut  a small plug of Ultrapro mesh and secured this to the hernia defects  circumferentially.  I then placed an onlay piece of Ultrapro mesh  to  cover the floor of the inguinal canal and secured it to the shelving  edge of inguinal ligament and the pubic tubercle with 0 Novofil suture.  We then secured it to the conjoint tendon medially into the internal  oblique medially using the same suture.  The ilioinguinal nerve was  divided, but iliohypogastric nerve was preserved.  This was pushed away  from the mesh to preserve it.  Hemostasis was excellent.  We then closed  the aponeurosis of the external oblique with 2-0 Vicryl.  A 3-0 Vicryl  was used to  reapproximate Scarpa fascia and 4-0 Monocryl was used to close the skin  in a subcuticular fashion.  All final counts of sponge, instruments, and  needles were correct.  The patient was awoken, taken to the recovery  room in satisfactory condition.      Thomas A. Cornett, M.D.  Electronically Signed     TAC/MEDQ  D:  01/10/2009  T:  01/10/2009  Job:  [id number]   cc:  Gaspar Garbe, M.D.

## 2011-01-22 NOTE — Assessment & Plan Note (Signed)
OFFICE VISIT   Patricia Villegas, GAMBINO  DOB:  [birth year]                                       06/29/2009  UEAVW#:[id number]   I saw the patient in the office today with the chief complaint of  increasing pain in both legs.  This is a pleasant 76 year old woman who  I had originally seen in consultation in September of 2009 with evidence  of bilateral infrainguinal arterial occlusive disease.  At that point I  felt that her symptoms were related both to her peripheral vascular  disease but also to her lumbar disk disease.  We had discussed  potentially proceeding with arteriography, however, she elected to  continue with conservative treatment.   Since I saw her last a year ago she states that her symptoms have  gradually progressed significantly over the last year.  She experiences  significant pain in both legs from the knee down associated with  ambulation and relieved with rest.  She has no thigh or hip  claudication.  Her symptoms are aggravated by walking and there are  really no alleviating factors.  She has some associated burning pain in  her feet and also notes that her feet are quite cold.  She also has some  chronic back pain.  She has had four previous operations on her back.  She denies any history of rest pain or nonhealing ulcers.   PAST MEDICAL HISTORY:  Is significant for adult onset diabetes which is  not insulin dependent, hypertension and hypercholesterolemia.  She  denies any history of previous myocardial infarction, history of  congestive heart failure or history of COPD.   FAMILY HISTORY:  She is unaware of any history of premature  cardiovascular disease or history of aneurysmal disease.   SOCIAL HISTORY:  She is married.  She has two children.  She is retired.  She quit tobacco 15 years ago.   REVIEW OF SYSTEMS:  CARDIAC:  She has had no chest pain, chest pressure,  palpitations or arrhythmias.  GI:  She has had some chronic  constipation.  VASCULAR:  She has had claudication in both legs.  She has had no  history of stroke, TIAs, expressive or receptive aphasia or amaurosis  fugax.  She has had no history of DVT or phlebitis.  MUSCULOSKELETAL:  She does have a history of arthritis.  General, pulmonary, GU, neuro, psychiatric, ENT, hematologic and  endocrine review of systems are unremarkable.   MEDICATIONS:  Documented on the medical history form in her chart.   ALLERGIES:  She has no known drug allergies.   PHYSICAL EXAMINATION:  General:  This is a pleasant 76 year old woman  who appears her stated age.  Vital signs:  Her blood pressure is 173/83,  heart rate is 64, saturation 99%.  HEENT:  Pupils are equal, round and  reactive to light and accommodation.  Extraocular motions are intact.  Conjunctivae are normal.  Neck:  Is supple.  There is no cervical  lymphadenopathy.  She has no significant JVD.  I do not detect any  carotid bruits.  Lungs:  Are clear bilaterally to auscultation without  rales, rhonchi or wheezing.  Cardiovascular:  Reveals a regular rate and  rhythm without murmur or gallop appreciated.  She has no significant  lower extremity peripheral edema.  She has palpable radial pulses  bilaterally and  palpable femoral pulses bilaterally.  I cannot palpate  popliteal, dorsalis pedis or posterior tibial pulses on either side.  Musculoskeletal:  She has no major deformities or cyanosis.  Neuro:  She  has no focal weakness or paresthesias.  Skin:  She has no ischemic  ulcers or rashes.   I have reviewed her arterial Doppler study which was done at [facility] on 05/25/2009.  This shows an ankle brachial index of 70% on the  right and 60% on the left.  She has also had a stress test done by  [facility] on 11/14/2008 which showed a normal stress nuclear  study without evidence of ischemia.  I have also reviewed her laboratory  evaluation which shows a creatinine of 0.9.  In  addition, her hemoglobin  A1c was 6.7 on 05/25/2009.   IMPRESSION:  This patient presents with significant progression of her  symptoms of bilateral lower extremity pain which I think is attributable  to her bilateral infrainguinal arterial occlusive disease and also to  some extent to her lumbar disk disease.  Certainly the burning pain  could be neurogenic.  However, given the progression of her symptoms I  have recommended we proceed with arteriography to evaluate for possible  revascularization.  I think she again has most likely infrainguinal  arterial occlusive disease and it is unlikely that she will have disease  amenable to angioplasty; however, I have explained that if we did find  disease amenable to angioplasty this could potentially be addressed at  the same time.  We have discussed the indications for arteriography and  the potential complications including but not limited to bleeding,  arterial injury and renal insufficiency.  All of her questions were  answered.  She is agreeable to proceed.  We will make further  recommendations pending the results of her arteriogram.   Di Kindle. Edilia Bo, M.D.  Electronically Signed   CSD/MEDQ  D:  06/29/2009  T:  06/30/2009  Job:  [id number]   cc:   Gaspar Garbe, M.D.

## 2011-08-23 ENCOUNTER — Encounter: Payer: Self-pay | Admitting: Emergency Medicine

## 2011-08-23 ENCOUNTER — Emergency Department (HOSPITAL_COMMUNITY): Payer: Medicare Other

## 2011-08-23 ENCOUNTER — Other Ambulatory Visit: Payer: Self-pay

## 2011-08-23 ENCOUNTER — Inpatient Hospital Stay (HOSPITAL_COMMUNITY)
Admission: EM | Admit: 2011-08-23 | Discharge: 2011-08-25 | DRG: 195 | Disposition: A | Discharge status: Home / Self Care | Payer: Medicare Other | Attending: Cardiology | Admitting: Cardiology

## 2011-08-23 DIAGNOSIS — Z7982 Long term (current) use of aspirin: Secondary | ICD-10-CM

## 2011-08-23 DIAGNOSIS — R0789 Other chest pain: Secondary | ICD-10-CM | POA: Diagnosis present

## 2011-08-23 DIAGNOSIS — I200 Unstable angina: Secondary | ICD-10-CM | POA: Diagnosis present

## 2011-08-23 DIAGNOSIS — J09X2 Influenza due to identified novel influenza A virus with other respiratory manifestations: Principal | ICD-10-CM | POA: Diagnosis present

## 2011-08-23 DIAGNOSIS — E782 Mixed hyperlipidemia: Secondary | ICD-10-CM | POA: Diagnosis present

## 2011-08-23 DIAGNOSIS — E119 Type 2 diabetes mellitus without complications: Secondary | ICD-10-CM | POA: Diagnosis present

## 2011-08-23 DIAGNOSIS — I10 Essential (primary) hypertension: Secondary | ICD-10-CM | POA: Diagnosis present

## 2011-08-23 DIAGNOSIS — R079 Chest pain, unspecified: Secondary | ICD-10-CM

## 2011-08-23 DIAGNOSIS — E785 Hyperlipidemia, unspecified: Secondary | ICD-10-CM | POA: Diagnosis present

## 2011-08-23 DIAGNOSIS — I739 Peripheral vascular disease, unspecified: Secondary | ICD-10-CM | POA: Diagnosis present

## 2011-08-23 HISTORY — DX: Encounter for other specified aftercare: Z51.89

## 2011-08-23 HISTORY — DX: Unspecified fracture of shaft of left fibula, initial encounter for closed fracture: S82.402A

## 2011-08-23 HISTORY — DX: Pure hypercholesterolemia, unspecified: E78.00

## 2011-08-23 HISTORY — DX: Unspecified osteoarthritis, unspecified site: M19.90

## 2011-08-23 HISTORY — DX: Personal history of other diseases of the digestive system: Z87.19

## 2011-08-23 HISTORY — DX: Depression, unspecified: F32.A

## 2011-08-23 HISTORY — DX: Essential (primary) hypertension: I10

## 2011-08-23 HISTORY — DX: Unspecified thoracic, thoracolumbar and lumbosacral intervertebral disc disorder: M51.9

## 2011-08-23 HISTORY — DX: Major depressive disorder, single episode, unspecified: F32.9

## 2011-08-23 HISTORY — DX: Other complications of enterostomy: K94.19

## 2011-08-23 HISTORY — DX: Shortness of breath: R06.02

## 2011-08-23 HISTORY — DX: Anxiety disorder, unspecified: F41.9

## 2011-08-23 HISTORY — DX: Anemia, unspecified: D64.9

## 2011-08-23 HISTORY — DX: Unspecified transfusion reaction, initial encounter: T80.92XA

## 2011-08-23 HISTORY — DX: Reserved for inherently not codable concepts without codable children: IMO0001

## 2011-08-23 HISTORY — DX: Peripheral vascular disease, unspecified: I73.9

## 2011-08-23 HISTORY — DX: Adverse effect of unspecified anesthetic, initial encounter: T41.45XA

## 2011-08-23 HISTORY — DX: Pneumonia, unspecified organism: J18.9

## 2011-08-23 LAB — POCT I-STAT TROPONIN I: Troponin i, poc: 0.12 ng/mL (ref 0.00–0.08)

## 2011-08-23 LAB — CBC
HCT: 36 % (ref 36.0–46.0)
Hemoglobin: 12.2 g/dL (ref 12.0–15.0)
MCH: 31.4 pg (ref 26.0–34.0)
MCHC: 33.9 g/dL (ref 30.0–36.0)
MCV: 92.8 fL (ref 78.0–100.0)
Platelets: 161 10*3/uL (ref 150–400)
RBC: 3.88 MIL/uL (ref 3.87–5.11)
RDW: 13.1 % (ref 11.5–15.5)
WBC: 6.7 10*3/uL (ref 4.0–10.5)

## 2011-08-23 LAB — BASIC METABOLIC PANEL
BUN: 15 mg/dL (ref 6–23)
CO2: 25 mEq/L (ref 19–32)
Calcium: 8.8 mg/dL (ref 8.4–10.5)
Chloride: 102 mEq/L (ref 96–112)
Creatinine, Ser: 0.91 mg/dL (ref 0.50–1.10)
GFR calc Af Amer: 70 mL/min — ABNORMAL LOW (ref >90)
GFR calc non Af Amer: 61 mL/min — ABNORMAL LOW (ref >90)
Glucose, Bld: 141 mg/dL — ABNORMAL HIGH (ref 70–99)
Potassium: 4 mEq/L (ref 3.5–5.1)
Sodium: 138 mEq/L (ref 135–145)

## 2011-08-23 MED ORDER — ASPIRIN 81 MG PO CHEW
324.0000 mg | CHEWABLE_TABLET | Freq: Once | ORAL | Status: AC
  Administered 2011-08-23: 324 mg via ORAL
  Filled 2011-08-23: qty 4

## 2011-08-23 MED ORDER — SODIUM CHLORIDE 0.9 % IV BOLUS (SEPSIS)
500.0000 mL | Freq: Once | INTRAVENOUS | Status: AC
  Administered 2011-08-23: 500 mL via INTRAVENOUS

## 2011-08-23 MED ORDER — ACETAMINOPHEN 325 MG PO TABS
650.0000 mg | ORAL_TABLET | Freq: Once | ORAL | Status: AC
  Administered 2011-08-23: 650 mg via ORAL
  Filled 2011-08-23: qty 2

## 2011-08-23 MED ORDER — IOHEXOL 350 MG/ML SOLN
100.0000 mL | Freq: Once | INTRAVENOUS | Status: AC | PRN
  Administered 2011-08-23: 100 mL via INTRAVENOUS

## 2011-08-23 NOTE — ED Provider Notes (Signed)
History    76yF with CP. Gradual onset last night. Radiates to mid back and L shoulder. Cough which preceded symptoms by a couple days. Productive. No unusual swelling or leg pain. Fever. Nausea. No vomiting. No sick contacts. Hx of htn and lhd.  CSN: [id number] Arrival date & time: 08/23/2011  8:14 PM   First MD Initiated Contact with Patient 08/23/11 2109      Chief Complaint  Patient presents with  . Chest Pain    (Consider location/radiation/quality/duration/timing/severity/associated sxs/prior treatment) HPI  Past Medical History  Diagnosis Date  . Diabetes mellitus   . Hypertension   . Pneumonia   . Hypercholesteremia   . DVT (deep vein thrombosis) in pregnancy     Past Surgical History  Procedure Date  . Hernia repair   . Back surgery     No family history on file.  History  Substance Use Topics  . Smoking status: Former Games developer  . Smokeless tobacco: Not on file  . Alcohol Use: No    OB History    Grav Para Term Preterm Abortions TAB SAB Ect Mult Living                  Review of Systems   Review of symptoms negative unless otherwise noted in HPI.   Allergies  Review of patient's allergies indicates no known allergies.  Home Medications   Current Outpatient Rx  Name Route Sig Dispense Refill  . BENAZEPRIL-HYDROCHLOROTHIAZIDE 20-12.5 MG PO TABS Oral Take 1 tablet by mouth daily.      Marland Kitchen CLONIDINE HCL 0.2 MG PO TABS Oral Take 0.2 mg by mouth 2 (two) times daily.      . FUROSEMIDE 20 MG PO TABS Oral Take 20 mg by mouth 2 (two) times daily.      Marland Kitchen GLIMEPIRIDE 2 MG PO TABS Oral Take 2 mg by mouth daily before breakfast.      . SIMVASTATIN 80 MG PO TABS Oral Take 80 mg by mouth at bedtime.        BP 154/60  Pulse 77  Temp(Src) 100.5 F (38.1 C) (Oral)  Resp 13  SpO2 96%  Physical Exam  Nursing note and vitals reviewed. Constitutional: She is oriented to person, place, and time. She appears well-developed and well-nourished. No distress.    HENT:  Head: Normocephalic and atraumatic.  Right Ear: External ear normal.  Left Ear: External ear normal.  Eyes: Conjunctivae are normal. Pupils are equal, round, and reactive to light. Right eye exhibits no discharge. Left eye exhibits no discharge.  Neck: Normal range of motion. Neck supple.  Cardiovascular: Normal rate, regular rhythm and normal heart sounds.  Exam reveals no gallop and no friction rub.   No murmur heard. Pulmonary/Chest: Effort normal and breath sounds normal. No respiratory distress. She has no wheezes. She has no rales.  Abdominal: Soft. She exhibits no distension. There is no tenderness.  Musculoskeletal: She exhibits no edema and no tenderness.  Lymphadenopathy:    She has no cervical adenopathy.  Neurological: She is alert and oriented to person, place, and time.  Skin: Skin is warm and dry.  Psychiatric: She has a normal mood and affect. Her behavior is normal. Thought content normal.    ED Course  Procedures (including critical care time)  Labs Reviewed  BASIC METABOLIC PANEL - Abnormal; Notable for the following:    Glucose, Bld 141 (*)    GFR calc non Af Amer 61 (*)    GFR calc Af  Amer 70 (*)    All other components within normal limits  POCT I-STAT TROPONIN I - Abnormal; Notable for the following:    Troponin i, poc 0.12 (*)    All other components within normal limits  URINALYSIS, ROUTINE W REFLEX MICROSCOPIC - Abnormal; Notable for the following:    APPearance HAZY (*)    Hgb urine dipstick SMALL (*)    All other components within normal limits  INFLUENZA PANEL BY PCR - Abnormal; Notable for the following:    Influenza A By PCR POSITIVE (*)    All other components within normal limits  CBC - Abnormal; Notable for the following:    Hemoglobin 11.8 (*)    HCT 35.9 (*)    Platelets 148 (*)    All other components within normal limits  BASIC METABOLIC PANEL - Abnormal; Notable for the following:    Potassium 3.4 (*)    Glucose, Bld 100 (*)     GFR calc non Af Amer 68 (*)    GFR calc Af Amer 79 (*)    All other components within normal limits  CBC - Abnormal; Notable for the following:    RBC 3.86 (*)    Hemoglobin 11.8 (*)    HCT 35.7 (*)    Platelets 148 (*)    All other components within normal limits  GLUCOSE, CAPILLARY - Abnormal; Notable for the following:    Glucose-Capillary 103 (*)    All other components within normal limits  GLUCOSE, CAPILLARY - Abnormal; Notable for the following:    Glucose-Capillary 137 (*)    All other components within normal limits  CBC - Abnormal; Notable for the following:    HCT 35.6 (*)    Platelets 148 (*)    All other components within normal limits  BASIC METABOLIC PANEL - Abnormal; Notable for the following:    Glucose, Bld 127 (*)    GFR calc non Af Amer 61 (*)    GFR calc Af Amer 71 (*)    All other components within normal limits  GLUCOSE, CAPILLARY - Abnormal; Notable for the following:    Glucose-Capillary 220 (*)    All other components within normal limits  GLUCOSE, CAPILLARY - Abnormal; Notable for the following:    Glucose-Capillary 165 (*)    All other components within normal limits  GLUCOSE, CAPILLARY - Abnormal; Notable for the following:    Glucose-Capillary 135 (*)    All other components within normal limits  GLUCOSE, CAPILLARY - Abnormal; Notable for the following:    Glucose-Capillary 150 (*)    All other components within normal limits  CBC  URINE MICROSCOPIC-ADD ON  CARDIAC PANEL(CRET KIN+CKTOT+MB+TROPI)  DIFFERENTIAL  HEPARIN LEVEL (UNFRACTIONATED)  CARDIAC PANEL(CRET KIN+CKTOT+MB+TROPI)  CARDIAC PANEL(CRET KIN+CKTOT+MB+TROPI)  HEPARIN LEVEL (UNFRACTIONATED)  LAB REPORT - SCANNED   EKG:  Rhythm: normal sinus Rate: 83  Axis:  Left Intervals: LVH with strain ST segments: strain pattern.    No results found.  9:43 PM Elevated trop noted. Pt with some pain in mid back. Will defer from asa until see cxr.  10:11 PM  Doesn't describe  pleuritic pain but given cough, small pleural effusion and fever will CT to eval for PE.  10:18 PM Discussed with Hochrein, cardiology. Will see in consultation.  1. Unstable angina   2. fever    MDM  76yf with CP and fever. Possible viral illness but has concerning story, risk factors and abnormal troponin. Admit for further tx and eval.;  Raeford Razor, MD 08/31/11 [id number]

## 2011-08-23 NOTE — ED Notes (Signed)
Patient states yesterday started having chest pressure substernal radiating to the left shoulder also states pain in low back. Reports fever of 103 but drank a lot of ice water and ended up feeling nausea. Reports a productive cough with white/yellow sputum.

## 2011-08-23 NOTE — ED Notes (Signed)
Critical i-STAT cTnl was shown to Dr. Juleen China.

## 2011-08-23 NOTE — ED Notes (Signed)
PT. REPORTS LEFT CHEST  PAIN RADIATING TO LEFT SHOULDER AND MID BACK , CHILLS ,  SOB  - PRODUCTIVE COUGH FOR SEVERAL DAYS .

## 2011-08-23 NOTE — H&P (Signed)
CARDIOLOGY CONSULT NOTE  Patient ID: Patricia Villegas MRN: [id number] DOB/AGE: 03-03-1935 76 y.o.  Admit date: 08/23/2011 Primary Physician [facility] Primary Cardiologist None Chief Complaint  Chest Pain  HPI:  The patient presents with chest pain.  She has no prior coronary disease history though she has a history of extensive peripheral vascular disease medically managed. She describes lower extremity arteriogram by Dr. Edilia Bo.  She does report having some stress testing years ago but no diagnosis of coronary disease.  She presented to the emergency room with chest discomfort. This has been for the last couple of days. She's never had discomfort like this. He's been somewhat heavy and intermittent chest into her shoulder blade. It has been fairly constant over the day today. She has had some discomfort with deep breathing. She had a fever to 103 at home. She's had a cough productive of some mild sputum. She's had some mild shortness of breath but not any new PND or orthopnea. She's had no palpitations, presyncope or syncope. In the emergency room she was not found to have any acute EKG changes. However, her troponin is mildly elevated troponin.  She does say she's had a slightly decreased exercise tolerance over the past 3 months or so. However, she remains active doing yard work and has not been bringing on any chest pressure, neck or arm discomfort. She's not had any palpitations, presyncope or syncope. Of note she has had long-standing diabetes and dyslipidemia as well as hypertension.  Past Medical History  Diagnosis Date  . Diabetes mellitus     Greater than 20 years  . Hypertension     Greater than 20 years  . Pneumonia   . Hypercholesteremia   . [name] (peripheral vascular disease)     Past Surgical History  Procedure Date  . Hernia repair   . Back surgery     No Known Allergies  Prior to Admission medications   Medication Sig      benazepril-hydrochlorthiazide (LOTENSIN  HCT) 20-12.5 MG per tablet Take 1 tablet by mouth daily.        cloNIDine (CATAPRES) 0.2 MG tablet Take 0.2 mg by mouth once daily      furosemide (LASIX) 20 MG tablet Take 20 mg by mouth once daily      glimepiride (AMARYL) 2 MG tablet Take 2 mg by mouth daily before breakfast.        simvastatin (ZOCOR) 80 MG tablet Take 80 mg by mouth at bedtime.          (Not in a hospital admission) Family History  Problem Relation Age of Onset  . Coronary artery disease Mother 80  . Diabetes Mother   . Diabetes Brother     History   Social History  . Marital Status: Married    Spouse Name: N/A    Number of Children: 2  . Years of Education: N/A   Occupational History  . Retired    Social History Main Topics  . Smoking status: Former Smoker -- 0.5 packs/day for 10 years    Types: Cigarettes  . Smokeless tobacco: Former Neurosurgeon    Quit date: 09/10/1995  . Alcohol Use: No  . Drug Use: No  . Sexually Active: Not on file   Other Topics Concern  . Not on file   Social History Narrative  . No narrative on file     ROS:  As stated in the HPI and negative for all other systems.  Physical Exam: Blood pressure  154/60, pulse 77, temperature 100.5 F (38.1 C), temperature source Oral, resp. rate 13, SpO2 96.00%.  GENERAL:  Well appearing HEENT:  Pupils equal round and reactive, fundi not visualized, oral mucosa unremarkable, poor dentition NECK:  No jugular venous distention, waveform within normal limits, carotid upstroke brisk and symmetric, soft left bruit, no thyromegaly LYMPHATICS:  No cervical, inguinal adenopathy LUNGS:  Clear to auscultation bilaterally BACK:  No CVA tenderness CHEST:  Unremarkable HEART:  PMI not displaced or sustained,S1 and S2 within normal limits, no S3, no S4, no clicks, no rubs, no murmurs ABD:  Flat, positive bowel sounds normal in frequency in pitch, no bruits, no rebound, no guarding, no midline pulsatile mass, no hepatomegaly, no splenomegaly EXT:  2  plus pulses upper and DP, decreased right PT, no edema, no cyanosis no clubbing SKIN:  No rashes no nodules NEURO:  Cranial nerves II through XII grossly intact, motor grossly intact throughout PSYCH:  Cognitively intact, oriented to person place and time  Labs: Lab Results  Component Value Date   BUN 15 08/23/2011   Lab Results  Component Value Date   CREATININE 0.91 08/23/2011   Lab Results  Component Value Date   NA 138 08/23/2011   K 4.0 08/23/2011   CL 102 08/23/2011   CO2 25 08/23/2011   No results found for this basename: CKTOTAL,  CKMB,  CKMBINDEX,  TROPONINI   Lab Results  Component Value Date   WBC 6.7 08/23/2011   HGB 12.2 08/23/2011   HCT 36.0 08/23/2011   MCV 92.8 08/23/2011   PLT 161 08/23/2011   No results found for this basename: CHOL,  HDL,  LDLCALC,  LDLDIRECT,  TRIG,  CHOLHDL   Lab Results  Component Value Date   ALT 18 01/05/2009   AST 21 01/05/2009   ALKPHOS 52 01/05/2009   BILITOT 0.5 01/05/2009    Radiology:  Small left pleural effusion. Negative for heart failure or pneumonia.  EKG: Sinus rhythm, left axis deviation, left anterior fascicular block, lethargy hypertrophy, probable old anteroseptal infarct, mild QT prolongation.  ASSESSMENT AND PLAN:   1)  Chest Pain:  Although the pain is atypical she certainly has significant cardiovascular risk factors and a slightly elevated troponin. The pretest probability of obstructive coronary disease is high. Cardiac catheterization will be indicated. She's not yet posted for this. I will admit her with heparin and aspirin. I will use nitroglycerin paste. She'll be on a low dose of beta blocker. Enzymes will be cycled and an EKG repeated in the morning.  I will check an echocardiogram.  2)  Diabetes:  She'll have a hemoglobin A1c. Continue her oral hypoglycemics.  3)  Hypertension: I will for now use the beta blocker and NTG.  Of note she's only been taking her clonidine once daily at home. He won't start  this for now.  4)  Hyperlipidemia:  I will check a lipid profile and continue Zocor for now.  SignedRollene Rotunda 08/23/2011, 11:07 PM

## 2011-08-23 NOTE — ED Notes (Signed)
Patient transported to X-ray 

## 2011-08-24 ENCOUNTER — Encounter (HOSPITAL_COMMUNITY): Payer: Self-pay | Admitting: *Deleted

## 2011-08-24 LAB — URINALYSIS, ROUTINE W REFLEX MICROSCOPIC
Bilirubin Urine: NEGATIVE
Glucose, UA: NEGATIVE mg/dL
Ketones, ur: NEGATIVE mg/dL
Nitrite: NEGATIVE
Specific Gravity, Urine: 1.02 (ref 1.005–1.030)
pH: 6.5 (ref 5.0–8.0)

## 2011-08-24 LAB — CBC
MCH: 30.5 pg (ref 26.0–34.0)
MCH: 30.6 pg (ref 26.0–34.0)
MCHC: 32.9 g/dL (ref 30.0–36.0)
MCV: 92.8 fL (ref 78.0–100.0)
MCV: 92.5 fL (ref 78.0–100.0)
Platelets: 148 10*3/uL — ABNORMAL LOW (ref 150–400)
Platelets: 148 10*3/uL — ABNORMAL LOW (ref 150–400)
RBC: 3.87 MIL/uL (ref 3.87–5.11)
RBC: 3.86 MIL/uL — ABNORMAL LOW (ref 3.87–5.11)
RDW: 13.2 % (ref 11.5–15.5)
RDW: 13.2 % (ref 11.5–15.5)
WBC: 4.7 10*3/uL (ref 4.0–10.5)

## 2011-08-24 LAB — BASIC METABOLIC PANEL
Calcium: 8.9 mg/dL (ref 8.4–10.5)
GFR calc Af Amer: 79 mL/min — ABNORMAL LOW (ref >90)
GFR calc non Af Amer: 68 mL/min — ABNORMAL LOW (ref >90)
Glucose, Bld: 100 mg/dL — ABNORMAL HIGH (ref 70–99)
Potassium: 3.4 mEq/L — ABNORMAL LOW (ref 3.5–5.1)
Sodium: 143 mEq/L (ref 135–145)

## 2011-08-24 LAB — GLUCOSE, CAPILLARY
Glucose-Capillary: 103 mg/dL — ABNORMAL HIGH (ref 70–99)
Glucose-Capillary: 220 mg/dL — ABNORMAL HIGH (ref 70–99)
Glucose-Capillary: 165 mg/dL — ABNORMAL HIGH (ref 70–99)

## 2011-08-24 LAB — DIFFERENTIAL
Basophils Relative: 0 % (ref 0–1)
Eosinophils Absolute: 0 10*3/uL (ref 0.0–0.7)
Eosinophils Relative: 0 % (ref 0–5)
Lymphs Abs: 1.5 10*3/uL (ref 0.7–4.0)
Neutrophils Relative %: 62 % (ref 43–77)

## 2011-08-24 LAB — CARDIAC PANEL(CRET KIN+CKTOT+MB+TROPI)
CK, MB: 2.9 ng/mL (ref 0.3–4.0)
CK, MB: 2.8 ng/mL (ref 0.3–4.0)
CK, MB: 3 ng/mL (ref 0.3–4.0)
Relative Index: 1.8 (ref 0.0–2.5)
Total CK: 154 U/L (ref 7–177)
Total CK: 163 U/L (ref 7–177)
Troponin I: <0.3 ng/mL (ref <0.30)

## 2011-08-24 LAB — URINE MICROSCOPIC-ADD ON

## 2011-08-24 LAB — INFLUENZA PANEL BY PCR (TYPE A & B)
H1N1 flu by pcr: NOT DETECTED
Influenza B By PCR: NEGATIVE

## 2011-08-24 MED ORDER — OSELTAMIVIR PHOSPHATE 75 MG PO CAPS
75.0000 mg | ORAL_CAPSULE | Freq: Two times a day (BID) | ORAL | Status: DC
  Administered 2011-08-24 – 2011-08-25 (×3): 75 mg via ORAL
  Filled 2011-08-24 (×4): qty 1

## 2011-08-24 MED ORDER — ASPIRIN 325 MG PO TABS
325.0000 mg | ORAL_TABLET | Freq: Every day | ORAL | Status: DC
  Administered 2011-08-24 – 2011-08-25 (×2): 325 mg via ORAL
  Filled 2011-08-24 (×2): qty 1

## 2011-08-24 MED ORDER — HEPARIN BOLUS VIA INFUSION
4000.0000 [IU] | Freq: Once | INTRAVENOUS | Status: AC
  Administered 2011-08-24: 4000 [IU] via INTRAVENOUS
  Filled 2011-08-24: qty 4000

## 2011-08-24 MED ORDER — NITROGLYCERIN 2 % TD OINT
1.0000 [in_us] | TOPICAL_OINTMENT | Freq: Four times a day (QID) | TRANSDERMAL | Status: DC
  Administered 2011-08-24 (×2): 1 [in_us] via TOPICAL
  Filled 2011-08-24 (×2): qty 30

## 2011-08-24 MED ORDER — CARVEDILOL 6.25 MG PO TABS
6.2500 mg | ORAL_TABLET | Freq: Two times a day (BID) | ORAL | Status: DC
  Administered 2011-08-24 – 2011-08-25 (×2): 6.25 mg via ORAL
  Filled 2011-08-24 (×4): qty 1

## 2011-08-24 MED ORDER — NITROGLYCERIN 2 % TD OINT
TOPICAL_OINTMENT | TRANSDERMAL | Status: AC
  Administered 2011-08-24: 1 [in_us] via TOPICAL
  Filled 2011-08-24: qty 1

## 2011-08-24 MED ORDER — HEPARIN SOD (PORCINE) IN D5W 100 UNIT/ML IV SOLN
900.0000 [IU]/h | INTRAVENOUS | Status: DC
  Administered 2011-08-24 (×2): 900 [IU]/h via INTRAVENOUS
  Filled 2011-08-24 (×3): qty 250

## 2011-08-24 MED ORDER — ROSUVASTATIN CALCIUM 20 MG PO TABS
20.0000 mg | ORAL_TABLET | Freq: Every day | ORAL | Status: DC
  Administered 2011-08-24: 20 mg via ORAL
  Filled 2011-08-24 (×2): qty 1

## 2011-08-24 MED ORDER — FUROSEMIDE 20 MG PO TABS
20.0000 mg | ORAL_TABLET | Freq: Two times a day (BID) | ORAL | Status: DC
  Administered 2011-08-24 – 2011-08-25 (×3): 20 mg via ORAL
  Filled 2011-08-24 (×5): qty 1

## 2011-08-24 MED ORDER — OSELTAMIVIR PHOSPHATE 75 MG PO CAPS
75.0000 mg | ORAL_CAPSULE | Freq: Every day | ORAL | Status: DC
  Filled 2011-08-24: qty 1

## 2011-08-24 MED ORDER — GLIMEPIRIDE 2 MG PO TABS
2.0000 mg | ORAL_TABLET | Freq: Every day | ORAL | Status: DC
  Administered 2011-08-24 – 2011-08-25 (×2): 2 mg via ORAL
  Filled 2011-08-24 (×3): qty 1

## 2011-08-24 MED ORDER — BENAZEPRIL HCL 20 MG PO TABS
20.0000 mg | ORAL_TABLET | Freq: Every day | ORAL | Status: DC
  Administered 2011-08-24 – 2011-08-25 (×2): 20 mg via ORAL
  Filled 2011-08-24 (×2): qty 1

## 2011-08-24 MED ORDER — HYDROCHLOROTHIAZIDE 12.5 MG PO CAPS
12.5000 mg | ORAL_CAPSULE | Freq: Every day | ORAL | Status: DC
  Administered 2011-08-24 – 2011-08-25 (×2): 12.5 mg via ORAL
  Filled 2011-08-24 (×2): qty 1

## 2011-08-24 NOTE — Progress Notes (Signed)
ANTICOAGULATION CONSULT NOTE - Initial Consult  Pharmacy Consult for heparin Indication: chest pain/ACS  No Known Allergies  Patient Measurements: Height: 5\' 6"  (167.6 cm) Weight: 160 lb 15 oz (73 kg) IBW/kg (Calculated) : 59.3   Vital Signs: Temp: 100.5 F (38.1 C) (12/14 2113) Temp src: Oral (12/14 2113) BP: 110/67 mmHg (12/14 2300) Pulse Rate: 76  (12/14 2300)  Labs:  [facility] 08/23/11 2043  HGB 12.2  HCT 36.0  PLT 161  APTT --  LABPROT --  INR --  HEPARINUNFRC --  CREATININE 0.91  CKTOTAL --  CKMB --  TROPONINI --   Estimated Creatinine Clearance: 55.5 ml/min (by C-G formula based on Cr of 0.91).  Medical History: Past Medical History  Diagnosis Date  . Diabetes mellitus     Greater than 20 years  . Hypertension     Greater than 20 years  . Pneumonia   . Hypercholesteremia   . [name] (peripheral vascular disease)     Stable claudication. Arteriogram 07/2009 no significant aortoiliac disease, has bilateral SFA occlusions with distal reconstitution   . Closed left fibular fracture     Casted   . Lumbar disc disease     S/p surgery x4     Medications:  Prescriptions prior to admission  Medication Sig Dispense Refill  . benazepril-hydrochlorthiazide (LOTENSIN HCT) 20-12.5 MG per tablet Take 1 tablet by mouth daily.        . cloNIDine (CATAPRES) 0.2 MG tablet Take 0.2 mg by mouth 2 (two) times daily.        . furosemide (LASIX) 20 MG tablet Take 20 mg by mouth 2 (two) times daily.        Marland Kitchen glimepiride (AMARYL) 2 MG tablet Take 2 mg by mouth daily before breakfast.        . simvastatin (ZOCOR) 80 MG tablet Take 80 mg by mouth at bedtime.          Assessment: 18 yof presented to the ED with atypical CP to start IV heparin. Will need cardiac cath. Baseline CBC ok.   Goal of Therapy:  Heparin level 0.3-0.7 units/ml   Plan:  Heparin bolus 4000units IV x 1 Heparin gtt 900units/hr Check 8 hour heparin level Daily heparin level and CBC  Cordera Stineman, Drake Leach 08/24/2011,1:31 AM

## 2011-08-24 NOTE — Progress Notes (Signed)
ANTICOAGULATION CONSULT NOTE - Initial Consult  Pharmacy Consult for heparin Indication: chest pain/ACS  No Known Allergies  Patient Measurements: Height: 5\' 6"  (167.6 cm) Weight: 160 lb 15 oz (73 kg) IBW/kg (Calculated) : 59.3   Vital Signs: Temp: 98.1 F (36.7 C) (12/15 0616) BP: 169/70 mmHg (12/15 0616) Pulse Rate: 61  (12/15 0616)  Labs:  Basename 08/24/11 0953 08/24/11 0600 08/24/11 0500 08/23/11 2043  HGB 11.8* 11.8* -- --  HCT 35.7* 35.9* -- 36.0  PLT 148* 148* -- 161  APTT -- -- -- --  LABPROT -- -- -- --  INR -- -- -- --  HEPARINUNFRC 0.50 -- -- --  CREATININE -- 0.83 -- 0.91  CKTOTAL -- -- 154 --  CKMB -- -- 2.9 --  TROPONINI -- -- <0.30 --   Estimated Creatinine Clearance: 60.8 ml/min (by C-G formula based on Cr of 0.83).  Medical History: Past Medical History  Diagnosis Date  . Diabetes mellitus     Greater than 20 years  . Hypertension     Greater than 20 years  . Pneumonia   . Hypercholesteremia   . [name] (peripheral vascular disease)     Stable claudication. Arteriogram 07/2009 no significant aortoiliac disease, has bilateral SFA occlusions with distal reconstitution   . Closed left fibular fracture     Casted   . Lumbar disc disease     S/p surgery x4   . Complication of anesthesia 1998    Unable to arouse fully, respiratory depression  . Angina   . Shortness of breath   . Anemia   . Blood transfusion   . Transfusion reaction 1960    Convulsions (? seizure)  . H/O hiatal hernia   . Parastomal hernia of ileal conduit 2009  . Neuromuscular disorder     Bilateral sciatica  . Arthritis   . Anxiety   . Depression     Medications:  Prescriptions prior to admission  Medication Sig Dispense Refill  . benazepril-hydrochlorthiazide (LOTENSIN HCT) 20-12.5 MG per tablet Take 1 tablet by mouth daily.        . cloNIDine (CATAPRES) 0.2 MG tablet Take 0.2 mg by mouth 2 (two) times daily.       . furosemide (LASIX) 20 MG tablet Take 20 mg by mouth  2 (two) times daily.        Marland Kitchen glimepiride (AMARYL) 2 MG tablet Take 2 mg by mouth daily before breakfast.        . simvastatin (ZOCOR) 80 MG tablet Take 80 mg by mouth at bedtime.          Assessment: 37 yof presented to the ED with atypical CP. HL at goal. Will continue current rate  Goal of Therapy:  Heparin level 0.3-0.7 units/ml   Plan:  - Continue Heparin gtt 900units/hr - F/u Daily heparin level and CBC  Kiylah Loyer 08/24/2011,11:11 AM

## 2011-08-24 NOTE — Progress Notes (Signed)
Patient ID: Patricia Villegas, female   DOB: 06-07-1935, 76 y.o.   MRN: [id number] [facility]  SUBJECTIVE: She continues to have on and off chest heaviness.  2nd set cardiac enzymes was negative.  She is positive for influenza A.  She has had temperature up to 102 in the hospital.      . acetaminophen  650 mg Oral Once  . aspirin  324 mg Oral Once  . aspirin  325 mg Oral Daily  . benazepril  20 mg Oral Daily  . carvedilol  6.25 mg Oral BID WC  . furosemide  20 mg Oral BID  . glimepiride  2 mg Oral QAC breakfast  . heparin  4,000 Units Intravenous Once  . hydrochlorothiazide  12.5 mg Oral Daily  . nitroGLYCERIN  1 inch Topical Q6H  . oseltamivir  75 mg Oral Daily  . rosuvastatin  20 mg Oral q1800  . sodium chloride  500 mL Intravenous Once      Filed Vitals:   08/23/11 2300 08/24/11 0100 08/24/11 0120 08/24/11 0616  BP: 110/67  169/76 169/70  Pulse: 76  63 61  Temp:   98.4 F (36.9 C) 98.1 F (36.7 C)  TempSrc:      Resp: 23  18 16   Height:  5\' 6"  (1.676 m)    Weight:  73 kg (160 lb 15 oz)    SpO2: 99%  97% 96%   No intake or output data in the 24 hours ending 08/24/11 1013  LABS: Basic Metabolic Panel:  Basename 08/24/11 0600 08/23/11 2043  NA 143 138  K 3.4* 4.0  CL 107 102  CO2 27 25  GLUCOSE 100* 141*  BUN 12 15  CREATININE 0.83 0.91  CALCIUM 8.9 8.8  MG -- --  PHOS -- --   Liver Function Tests: No results found for this basename: AST:2,ALT:2,ALKPHOS:2,BILITOT:2,PROT:2,ALBUMIN:2 in the last 72 hours No results found for this basename: LIPASE:2,AMYLASE:2 in the last 72 hours CBC:  Basename 08/24/11 0953 08/24/11 0600  WBC 4.7 5.2  NEUTROABS -- 3.2  HGB 11.8* 11.8*  HCT 35.7* 35.9*  MCV 92.5 92.8  PLT 148* 148*   Cardiac Enzymes:  Basename 08/24/11 0500  CKTOTAL 154  CKMB 2.9  CKMBINDEX --  TROPONINI <0.30   BNP: No components found with this basename: POCBNP:3 D-Dimer: No results found for this basename: DDIMER:2 in the last 72  hours Hemoglobin A1C: No results found for this basename: HGBA1C in the last 72 hours Fasting Lipid Panel: No results found for this basename: CHOL,HDL,LDLCALC,TRIG,CHOLHDL,LDLDIRECT in the last 72 hours Thyroid Function Tests: No results found for this basename: TSH,T4TOTAL,FREET3,T3FREE,THYROIDAB in the last 72 hours Anemia Panel: No results found for this basename: VITAMINB12,FOLATE,FERRITIN,TIBC,IRON,RETICCTPCT in the last 72 hours  RADIOLOGY: Dg Chest 2 View  08/23/2011  *RADIOLOGY REPORT*  Clinical Data: Chest pain and short of breath  CHEST - 2 VIEW  Comparison: 01/05/2009  Findings: Heart size and vascularity are normal.  Negative for pneumonia.  Negative for mass. Small left pleural effusion.  Mild scarring in the apices.  IMPRESSION: Small left pleural effusion.  Negative for heart failure or pneumonia.  Original Report Authenticated By: Camelia Phenes, M.D.   Ct Angio Chest W/cm &/or Wo Cm  08/23/2011  *RADIOLOGY REPORT*  Clinical Data:  Chest pain, radiates to left shoulder.  Fever.  CT ANGIOGRAPHY CHEST WITH CONTRAST  Technique:  Multidetector CT imaging of the chest was performed using the standard protocol during bolus administration of intravenous contrast.  Multiplanar CT image reconstructions including MIPs were obtained to evaluate the vascular anatomy.  Contrast: OMNIPAQUE IOHEXOL 350 MG/ML IV SOLN  Comparison:  08/23/2011 radiograph  Findings:  Patent main, lobar, and segmental branches.  The subsegmental branches are suboptimally evaluated due to respiratory motion.  Ectasia of the ascending aorta.  Scattered atherosclerotic calcification.  No dissection flap. Coronary artery calcification. Aortic valve calcification.  Mitral annular calcification.  Heart size upper normal limits with trace pericardial fluid.  Dependent bibasilar opacities likely represent atelectasis or scarring.  Mild biapical scarring.  Respiratory motion degrades detailed parenchymal evaluation. 9 mm  subpleural opacity within the right upper lung on image 31 and right middle lobe on image 47 have a configuration favoring scarring.  No pneumothorax.  No intrathoracic lymphadenopathy.  Limited images through the upper abdomen show no acute abnormality. Small hiatal hernia.  Multilevel degenerative changes of the imaged spine. No acute or aggressive appearing osseous lesion.  Review of the MIP images confirms the above findings.  IMPRESSION: No main, lobar, or segmental branch filling defect to suggest pulmonary embolism.  The subsegmental branches are suboptimally evaluated due to respiratory motion.  Ascending aorta ectasia without dissection flap.  Heart size upper normal limits with trace pericardial fluid.  Coronary artery, aortic valve, and mitral annular calcifications.  Original Report Authenticated By: Waneta Martins, M.D.    PHYSICAL EXAM General: NAD Neck: No JVD, no thyromegaly or thyroid nodule.  Lungs: Slight crackles right base.  CV: Nondisplaced PMI.  Heart regular S1/S2, no S3/S4, no murmur.  No peripheral edema.  No carotid bruit.  Normal pedal pulses.  Abdomen: Soft, nontender, no hepatosplenomegaly, no distention.  Neurologic: Alert and oriented x 3.  Psych: Normal affect. Extremities: No clubbing or cyanosis.   ASSESSMENT AND PLAN: 76 yo with history of PAD, DM, and HTN has felt "bad" since Tuesday.  She has had a fever at home and has had temperature to 102 here. She has had on and off central chest heaviness for several days now.  Initial point of care TnI was 0.12, 2nd set of markers normal.  1. CAD: Pre-test probability is high given her risk factors.  However, she has confirmed influenza A.  It is possible that her symptoms are due to the flu.  Initial poc troponin was slightly elevated but ? Lab error as repeat enzymes were normal.  - Continue ASA, heparin. - Continue to cycle cardiac enzymes today - if remain negative, would consider myoview tomorrow in setting of  her current medical illness rather than cath. Will order myoview, will cancel if enzymes come back positive.  2. ID: Influenza A confirmed by nasal swab.  Will start oseltamivir.  3. HTN: Restart home meds but will use Coreg rather than clonidine.  [name] 08/24/2011 10:20 AM

## 2011-08-25 ENCOUNTER — Inpatient Hospital Stay (HOSPITAL_COMMUNITY): Payer: Medicare Other

## 2011-08-25 DIAGNOSIS — I517 Cardiomegaly: Secondary | ICD-10-CM

## 2011-08-25 DIAGNOSIS — R079 Chest pain, unspecified: Secondary | ICD-10-CM

## 2011-08-25 LAB — CBC
Platelets: 148 10*3/uL — ABNORMAL LOW (ref 150–400)
RBC: 3.88 MIL/uL (ref 3.87–5.11)
RDW: 13.2 % (ref 11.5–15.5)
WBC: 5.4 10*3/uL (ref 4.0–10.5)

## 2011-08-25 LAB — GLUCOSE, CAPILLARY: Glucose-Capillary: 135 mg/dL — ABNORMAL HIGH (ref 70–99)

## 2011-08-25 LAB — BASIC METABOLIC PANEL
Chloride: 100 mEq/L (ref 96–112)
GFR calc Af Amer: 71 mL/min — ABNORMAL LOW (ref >90)
GFR calc non Af Amer: 61 mL/min — ABNORMAL LOW (ref >90)
Potassium: 4.2 mEq/L (ref 3.5–5.1)
Sodium: 137 mEq/L (ref 135–145)

## 2011-08-25 MED ORDER — REGADENOSON 0.4 MG/5ML IV SOLN
0.4000 mg | Freq: Once | INTRAVENOUS | Status: AC
  Administered 2011-08-25: 0.4 mg via INTRAVENOUS

## 2011-08-25 MED ORDER — CARVEDILOL 6.25 MG PO TABS: 12.5000 mg | ORAL_TABLET | Freq: Two times a day (BID) | ORAL | Status: DC

## 2011-08-25 MED ORDER — TECHNETIUM TC 99M TETROFOSMIN IV KIT
10.0000 | PACK | Freq: Once | INTRAVENOUS | Status: AC | PRN
  Administered 2011-08-25: 10 via INTRAVENOUS

## 2011-08-25 MED ORDER — TECHNETIUM TC 99M TETROFOSMIN IV KIT
30.0000 | PACK | Freq: Once | INTRAVENOUS | Status: AC | PRN
  Administered 2011-08-25: 30 via INTRAVENOUS

## 2011-08-25 MED ORDER — OSELTAMIVIR PHOSPHATE 75 MG PO CAPS: 75.0000 mg | ORAL_CAPSULE | Freq: Two times a day (BID) | ORAL | Status: AC

## 2011-08-25 NOTE — Progress Notes (Signed)
ANTICOAGULATION CONSULT NOTE - Initial Consult  Pharmacy Consult for heparin Indication: chest pain/ACS  No Known Allergies  Patient Measurements: Height: 5\' 6"  (167.6 cm) Weight: 160 lb 15 oz (73 kg) IBW/kg (Calculated) : 59.3   Vital Signs: Temp: 99.2 F (37.3 C) (12/16 0600) BP: 115/67 mmHg (12/16 0600) Pulse Rate: 75  (12/16 0600)  Labs:  Basename 08/25/11 0647 08/24/11 1613 08/24/11 1030 08/24/11 0953 08/24/11 0600 08/24/11 0500 08/23/11 2043  HGB 12.0 -- -- 11.8* -- -- --  HCT 35.6* -- -- 35.7* 35.9* -- --  PLT 148* -- -- 148* 148* -- --  APTT -- -- -- -- -- -- --  LABPROT -- -- -- -- -- -- --  INR -- -- -- -- -- -- --  HEPARINUNFRC 0.66 -- -- 0.50 -- -- --  CREATININE -- -- -- -- 0.83 -- 0.91  CKTOTAL -- 163 153 -- -- 154 --  CKMB -- 3.0 2.8 -- -- 2.9 --  TROPONINI -- <0.30 <0.30 -- -- <0.30 --   Estimated Creatinine Clearance: 60.8 ml/min (by C-G formula based on Cr of 0.83).  Medical History: Past Medical History  Diagnosis Date  . Diabetes mellitus     Greater than 20 years  . Hypertension     Greater than 20 years  . Pneumonia   . Hypercholesteremia   . [name] (peripheral vascular disease)     Stable claudication. Arteriogram 07/2009 no significant aortoiliac disease, has bilateral SFA occlusions with distal reconstitution   . Closed left fibular fracture     Casted   . Lumbar disc disease     S/p surgery x4   . Complication of anesthesia 1998    Unable to arouse fully, respiratory depression  . Angina   . Shortness of breath   . Anemia   . Blood transfusion   . Transfusion reaction 1960    Convulsions (? seizure)  . H/O hiatal hernia   . Parastomal hernia of ileal conduit 2009  . Neuromuscular disorder     Bilateral sciatica  . Arthritis   . Anxiety   . Depression     Medications:  Prescriptions prior to admission  Medication Sig Dispense Refill  . benazepril-hydrochlorthiazide (LOTENSIN HCT) 20-12.5 MG per tablet Take 1 tablet by mouth  daily.        . cloNIDine (CATAPRES) 0.2 MG tablet Take 0.2 mg by mouth 2 (two) times daily.       . furosemide (LASIX) 20 MG tablet Take 20 mg by mouth 2 (two) times daily.        Marland Kitchen glimepiride (AMARYL) 2 MG tablet Take 2 mg by mouth daily before breakfast.        . simvastatin (ZOCOR) 80 MG tablet Take 80 mg by mouth at bedtime.          Assessment: 65 yof presented to the ED with atypical CP. HL at goal: 0.66. Will continue current rate. CBC stable. Cardiac panel negative  Goal of Therapy:  Heparin level 0.3-0.7 units/ml   Plan:  - Continue Heparin gtt 900units/hr - F/u Daily heparin level and CBC - f/u plan for anticoagulation  Janace Litten, PharmD [phone] 08/25/2011,8:08 AM

## 2011-08-25 NOTE — Progress Notes (Signed)
Patient ID: Patricia Villegas, female   DOB: Oct 19, 1934, 76 y.o.   MRN: [id number] [facility]  SUBJECTIVE: Feeling better.  All sets of cardiac enzymes negative except for point of care.  Fever curve coming down.  Stress test today negative for ischemia.      Marland Kitchen aspirin  325 mg Oral Daily  . benazepril  20 mg Oral Daily  . carvedilol  6.25 mg Oral BID WC  . furosemide  20 mg Oral BID  . glimepiride  2 mg Oral QAC breakfast  . hydrochlorothiazide  12.5 mg Oral Daily  . nitroGLYCERIN  1 inch Topical Q6H  . oseltamivir  75 mg Oral BID  . regadenoson  0.4 mg Intravenous Once  . rosuvastatin  20 mg Oral q1800      Filed Vitals:   08/25/11 0900 08/25/11 0959 08/25/11 1000 08/25/11 1002  BP: 142/67 162/47 132/58 145/55  Pulse: 68 75 78 74  Temp:      TempSrc:      Resp:      Height:      Weight:      SpO2:        Intake/Output Summary (Last 24 hours) at 08/25/11 1224 Last data filed at 08/25/11 0600  Gross per 24 hour  Intake    699 ml  Output      0 ml  Net    699 ml    LABS: Basic Metabolic Panel:  Basename 08/25/11 0647 08/24/11 0600  NA 137 143  K 4.2 3.4*  CL 100 107  CO2 27 27  GLUCOSE 127* 100*  BUN 14 12  CREATININE 0.90 0.83  CALCIUM 9.2 8.9  MG -- --  PHOS -- --   Liver Function Tests: No results found for this basename: AST:2,ALT:2,ALKPHOS:2,BILITOT:2,PROT:2,ALBUMIN:2 in the last 72 hours No results found for this basename: LIPASE:2,AMYLASE:2 in the last 72 hours CBC:  Basename 08/25/11 0647 08/24/11 0953 08/24/11 0600  WBC 5.4 4.7 --  NEUTROABS -- -- 3.2  HGB 12.0 11.8* --  HCT 35.6* 35.7* --  MCV 91.8 92.5 --  PLT 148* 148* --   Cardiac Enzymes:  Basename 08/24/11 1613 08/24/11 1030 08/24/11 0500  CKTOTAL 163 153 154  CKMB 3.0 2.8 2.9  CKMBINDEX -- -- --  TROPONINI <0.30 <0.30 <0.30   BNP: No components found with this basename: POCBNP:3 D-Dimer: No results found for this basename: DDIMER:2 in the last 72 hours Hemoglobin  A1C: No results found for this basename: HGBA1C in the last 72 hours Fasting Lipid Panel: No results found for this basename: CHOL,HDL,LDLCALC,TRIG,CHOLHDL,LDLDIRECT in the last 72 hours Thyroid Function Tests: No results found for this basename: TSH,T4TOTAL,FREET3,T3FREE,THYROIDAB in the last 72 hours Anemia Panel: No results found for this basename: VITAMINB12,FOLATE,FERRITIN,TIBC,IRON,RETICCTPCT in the last 72 hours  RADIOLOGY: Dg Chest 2 View  08/23/2011  *RADIOLOGY REPORT*  Clinical Data: Chest pain and short of breath  CHEST - 2 VIEW  Comparison: 01/05/2009  Findings: Heart size and vascularity are normal.  Negative for pneumonia.  Negative for mass. Small left pleural effusion.  Mild scarring in the apices.  IMPRESSION: Small left pleural effusion.  Negative for heart failure or pneumonia.  Original Report Authenticated By: Camelia Phenes, M.D.   Ct Angio Chest W/cm &/or Wo Cm  08/23/2011  *RADIOLOGY REPORT*  Clinical Data:  Chest pain, radiates to left shoulder.  Fever.  CT ANGIOGRAPHY CHEST WITH CONTRAST  Technique:  Multidetector CT imaging of the chest was performed using the standard protocol  during bolus administration of intravenous contrast.  Multiplanar CT image reconstructions including MIPs were obtained to evaluate the vascular anatomy.  Contrast: OMNIPAQUE IOHEXOL 350 MG/ML IV SOLN  Comparison:  08/23/2011 radiograph  Findings:  Patent main, lobar, and segmental branches.  The subsegmental branches are suboptimally evaluated due to respiratory motion.  Ectasia of the ascending aorta.  Scattered atherosclerotic calcification.  No dissection flap. Coronary artery calcification. Aortic valve calcification.  Mitral annular calcification.  Heart size upper normal limits with trace pericardial fluid.  Dependent bibasilar opacities likely represent atelectasis or scarring.  Mild biapical scarring.  Respiratory motion degrades detailed parenchymal evaluation. 9 mm subpleural opacity  within the right upper lung on image 31 and right middle lobe on image 47 have a configuration favoring scarring.  No pneumothorax.  No intrathoracic lymphadenopathy.  Limited images through the upper abdomen show no acute abnormality. Small hiatal hernia.  Multilevel degenerative changes of the imaged spine. No acute or aggressive appearing osseous lesion.  Review of the MIP images confirms the above findings.  IMPRESSION: No main, lobar, or segmental branch filling defect to suggest pulmonary embolism.  The subsegmental branches are suboptimally evaluated due to respiratory motion.  Ascending aorta ectasia without dissection flap.  Heart size upper normal limits with trace pericardial fluid.  Coronary artery, aortic valve, and mitral annular calcifications.  Original Report Authenticated By: Waneta Martins, M.D.    PHYSICAL EXAM General: NAD Neck: No JVD, no thyromegaly or thyroid nodule.  Lungs: Slight crackles right base.  CV: Nondisplaced PMI.  Heart regular S1/S2, no S3/S4, no murmur.  No peripheral edema.  No carotid bruit.  Normal pedal pulses.  Abdomen: Soft, nontender, no hepatosplenomegaly, no distention.  Neurologic: Alert and oriented x 3.  Psych: Normal affect. Extremities: No clubbing or cyanosis.   ASSESSMENT AND PLAN: 76 yo with history of PAD, DM, and HTN has felt "bad" since Tuesday.  She has had a fever at home and has had temperature to 102 here. She had on and off central chest heaviness for several days prior to admission.  Initial point of care TnI was 0.12, but all three cardiac enzymes following were negative.   1. CAD: She has multiple risk factors for CAD but I think that the symptoms that brought her to the hospital were due to influenza.  Suspect point of care troponin was spurious as all other CEs negative.  Myoview today showed no ischemia.   2. ID: Influenza A confirmed by nasal swab.  Continue oseltamivir. 3. HTN: Increase Coreg to 12.5 mg bid.  4.  Disposition: Home today with followup with cardiology in 2 weeks or so (can see me).  Continue home meds except stop clonidine and use Coreg 12.5 mg bid instead.  She should complete a 5 day course of oseltamivir for flu.    [name] 08/25/2011 12:24 PM

## 2011-08-25 NOTE — Progress Notes (Signed)
Echocardiogram 2D Echocardiogram has been performed.  Katheren Puller 08/25/2011, 12:47 PM

## 2011-08-25 NOTE — Discharge Summary (Signed)
Physician Discharge Summary  Patient ID: DARIANN HUCKABA MRN: [id number] DOB/AGE: 74-Jun-1936 74 y.o.  Admit date: 08/23/2011 Discharge date: 08/25/2011  Primary Discharge Diagnosis: Influenza A Secondary Discharge Diagnosis 1. Noncardiac Chest Pain 2. Hypertension 3. Diabetes 4. PAD 5. Dyslipidemia  Significant Diagnostic Studies:Nuclear stress test 08/25/2011 IMPRESSION: 1. No evidence for pharmacologically induced myocardial ischemia. 2. Normal left ventricular systolic function. 3. Left ventricular ejection fraction equals 75%.  Consults: None  Hospital Course: Mrs. Scotti is a 76 year old patient of Dr. Guerry Bruin and now Dr. [name] nearly established here in the hospital. The patient has a history of extensive peripheral vascular disease which is medically managed by Dr. Edilia Bo. She presented to the emergency room on 12/22/2010 with intermittent chest pain radiating to the left shoulder blade. It was found to be constant over the 2 days prior to admission. She did have some mild shortness of breath but no new PND or orthopnea. She was subsequently admitted to rule out myocardial infarction and cardiac etiology of her chest pain. Cardiac enzymes were found to be negative and she was ruled out for MI. However nasal swab was positive for influenza A. She was started on Tamiflu 500 mg daily. She did have a low-grade fever of 100.5. during hospitalization that was treated with Tylenol. On day of discharge she was seen and evaluated by Dr. [name] apparently stable. She is given prescription for Tamiflu as an outpatient. Clonidine was discontinued and Coreg was increased from 6.25 mg twice a day to 12.5 mg twice a day. She does have multiple cardiovascular risk factors to include hypertension diabetes hypercholesterolemia. He will follow her in the office for further evaluation and testing secondary to this. Lipids were not completed during this hospitalization and recommended  this be done as an outpatient and most recently reported.   Discharge Exam: Blood pressure 145/55, pulse 74, temperature 99.2 F (37.3 C), temperature source Oral, resp. rate 16, height 5\' 6"  (1.676 m), weight 160 lb 15 oz (73 kg), SpO2 93.00%. Labs:   Lab Results  Component Value Date   WBC 5.4 08/25/2011   HGB 12.0 08/25/2011   HCT 35.6* 08/25/2011   MCV 91.8 08/25/2011   PLT 148* 08/25/2011     Lab 08/25/11 0647  NA 137  K 4.2  CL 100  CO2 27  BUN 14  CREATININE 0.90  CALCIUM 9.2  PROT --  BILITOT --  ALKPHOS --  ALT --  AST --  GLUCOSE 127*   Lab Results  Component Value Date   CKTOTAL 163 08/24/2011   CKMB 3.0 08/24/2011   TROPONINI <0.30 08/24/2011        Radiology: Dg Chest 2 View  08/23/2011  *RADIOLOGY REPORT*  Clinical Data: Chest pain and short of breath  CHEST - 2 VIEW  Comparison: 01/05/2009  Findings: Heart size and vascularity are normal.  Negative for pneumonia.  Negative for mass. Small left pleural effusion.  Mild scarring in the apices.  IMPRESSION: Small left pleural effusion.  Negative for heart failure or pneumonia.  Original Report Authenticated By: Camelia Phenes, M.D.   Ct Angio Chest W/cm &/or Wo Cm  08/23/2011  *RADIOLOGY REPORT*  Clinical Data:  Chest pain, radiates to left shoulder.  Fever.  CT ANGIOGRAPHY CHEST WITH CONTRAST    IMPRESSION: No main, lobar, or segmental branch filling defect to suggest pulmonary embolism.  The subsegmental branches are suboptimally evaluated due to respiratory motion.  Ascending aorta ectasia without dissection flap.  Heart size  upper normal limits with trace pericardial fluid.  Coronary artery, aortic valve, and mitral annular calcifications.  Original Report Authenticated By: Waneta Martins, M.D.   Nm Myocar Multi W/spect W/wall Motion / Ef  08/25/2011  *RADIOLOGY REPORT* MYOCARDIAL IMAGING WITH SPECT (REST AND PHARMACOLOGIC-STRESS)  Findings:  Between the rest and stress images there is no abnormal  areas of myocardial reversibility.  Apical thinning is noted on both the rest and stress images.  GATED LEFT VENTRICULAR WALL MOTION STUDY  Findings:  Review of the gated images demonstrates normal left ventricular wall motion and thickening.  LEFT VENTRICULAR EJECTION FRACTION  Findings:  QGS ejection fraction measures 75% , with an end- diastolic volume of 63 ml and an end-systolic volume of 16 ml.  IMPRESSION:  1.  No evidence for pharmacologically induced myocardial ischemia. 2.  Normal left ventricular systolic function. 3.  Left ventricular ejection fraction equals 75%.  Original Report Authenticated By: Rosealee Albee, M.D.      FOLLOW UP PLANS AND APPOINTMENTS Discharge Orders    Future Orders Please Complete By Expires   Diet - low sodium heart healthy      Increase activity slowly        Current Discharge Medication List    START taking these medications   Details  carvedilol (COREG) 6.25 MG tablet Take 2 tablets (12.5 mg total) by mouth 2 (two) times daily with a meal. Qty: 60 tablet, Refills: 10    oseltamivir (TAMIFLU) 75 MG capsule Take 1 capsule (75 mg total) by mouth 2 (two) times daily. Qty: 10 capsule, Refills: 0      CONTINUE these medications which have NOT CHANGED   Details  benazepril-hydrochlorthiazide (LOTENSIN HCT) 20-12.5 MG per tablet Take 1 tablet by mouth daily.      furosemide (LASIX) 20 MG tablet Take 20 mg by mouth 2 (two) times daily.      glimepiride (AMARYL) 2 MG tablet Take 2 mg by mouth daily before breakfast.      simvastatin (ZOCOR) 80 MG tablet Take 80 mg by mouth at bedtime.        STOP taking these medications     cloNIDine (CATAPRES) 0.2 MG tablet        Follow-up Information    Follow up with [name], MD. (Our office will call you for appointment in 2 weeks.)    Contact information:   [address] [place] [address] [phone]            Time spent with  patient to include physician time:35 minutes Signed: Joni Reining 08/25/2011, 12:46 PM Co-Sign MD

## 2011-08-26 MED FILL — Regadenoson IV Inj 0.4 MG/5ML (0.08 MG/ML): INTRAVENOUS | Qty: 5 | Status: AC

## 2011-09-18 ENCOUNTER — Encounter: Payer: Self-pay | Admitting: Physician Assistant

## 2011-09-18 ENCOUNTER — Ambulatory Visit (INDEPENDENT_AMBULATORY_CARE_PROVIDER_SITE_OTHER): Payer: Medicare Other | Admitting: Physician Assistant

## 2011-09-18 DIAGNOSIS — R079 Chest pain, unspecified: Secondary | ICD-10-CM | POA: Insufficient documentation

## 2011-09-18 DIAGNOSIS — I200 Unstable angina: Secondary | ICD-10-CM

## 2011-09-18 DIAGNOSIS — I739 Peripheral vascular disease, unspecified: Secondary | ICD-10-CM | POA: Insufficient documentation

## 2011-09-18 DIAGNOSIS — I10 Essential (primary) hypertension: Secondary | ICD-10-CM

## 2011-09-18 MED ORDER — CARVEDILOL 25 MG PO TABS: ORAL_TABLET | ORAL | Status: DC

## 2011-09-18 NOTE — Progress Notes (Signed)
HPI:  This is a 77 year old white female patient who was recently admitted to the hospital with intermittent chest pain and was found to be positive for influenza. He does have multiple cardiac risk factors. Cardiac enzymes were negative. She underwent CT angiogram chest that showed no pulmonary embolus and calcifications of the coronary aortic valve and mitral annular calcifications. Stress Myoview showed no evidence of ischemia with normal LV function ejection fraction 75%. 2-D echo showed grade 1 diastolic dysfunction.  The patient's clonidine was discontinued and Coreg was started. She has a long history of hypertension that's been difficult to control. Her best blood pressures at home are 148/78. They often run 200 systolic 100 diastolic. She had been on clonidine for 15 years.  She continues to have some chest heaviness if she lays down. She has had 2 pillow orthopnea since she quit smoking 15 years ago. She denies any chest pain with exertion.she also has sharp shooting pains under her left breast on occasion. She says she's done and normal amount of coughing from the flu and feels like it's all related. She does have claudication symptoms and bilateral lower extremities and is not able to walk more than half a mile.   No Known Allergies  Current Outpatient Prescriptions on File Prior to Visit  Medication Sig Dispense Refill  . benazepril-hydrochlorthiazide (LOTENSIN HCT) 20-12.5 MG per tablet Take 1 tablet by mouth daily.        . carvedilol (COREG) 6.25 MG tablet Take 2 tablets (12.5 mg total) by mouth 2 (two) times daily with a meal.  60 tablet  10  . furosemide (LASIX) 20 MG tablet Take 20 mg by mouth 2 (two) times daily.        Marland Kitchen glimepiride (AMARYL) 2 MG tablet Take 2 mg by mouth daily before breakfast.        . simvastatin (ZOCOR) 80 MG tablet Take 80 mg by mouth at bedtime.          Past Medical History  Diagnosis Date  . Diabetes mellitus     Greater than 20 years  .  Hypertension     Greater than 20 years  . Pneumonia   . Hypercholesteremia   . [name] (peripheral vascular disease)     Stable claudication. Arteriogram 07/2009 no significant aortoiliac disease, has bilateral SFA occlusions with distal reconstitution   . Closed left fibular fracture     Casted   . Lumbar disc disease     S/p surgery x4   . Complication of anesthesia 1998    Unable to arouse fully, respiratory depression  . Angina   . Shortness of breath   . Anemia   . Blood transfusion   . Transfusion reaction 1960    Convulsions (? seizure)  . H/O hiatal hernia   . Parastomal hernia of ileal conduit 2009  . Neuromuscular disorder     Bilateral sciatica  . Arthritis   . Anxiety   . Depression     Past Surgical History  Procedure Date  . Hernia repair 2009  . Back surgery     4 back operations most recently 1995, 1998    Family History  Problem Relation Age of Onset  . Coronary artery disease Mother 78  . Diabetes Mother   . Stroke Mother   . Heart attack Mother   . Cancer Brother   . Cancer Sister   . Diabetes Brother     History   Social History  . Marital Status:  Married    Spouse Name: N/A    Number of Children: 2  . Years of Education: N/A   Occupational History  . Retired    Social History Main Topics  . Smoking status: Former Smoker -- 0.5 packs/day for 10 years    Types: Cigarettes    Quit date: 09/09/1996  . Smokeless tobacco: Not on file  . Alcohol Use: No  . Drug Use: No  . Sexually Active: Not Currently   Other Topics Concern  . Not on file   Social History Narrative   Married and has two children     ROS: See HPI Eyes: Negative Ears:Negative for hearing loss, tinnitus Cardiovascular: Negative for , palpitations,irregular heartbeat, dyspnea, dyspnea on exertion, near-syncope, orthopnea, paroxysmal nocturnal dyspnia and syncope,edema,  cyanosis,. patient has tingling and her feet when she walks and it's worse when she lays  down. Respiratory:   Negative for hemoptysis, sleep disturbances due to breathing, sputum production and wheezing.   Endocrine: Negative for cold intolerance and heat intolerance.  Hematologic/Lymphatic: Negative for adenopathy and bleeding problem. Does not bruise/bleed easily.  Musculoskeletal: Negative.   Gastrointestinal: Negative for nausea, vomiting, reflux, abdominal pain, diarrhea, constipation.   Neurological: Negative.  Allergic/Immunologic: Negative for environmental allergies.   PHYSICAL EXAM: Well-nournished, in no acute distress. Neck: No JVD, HJR, Bruit, or thyroid enlargement Lungs: Decreased breath sounds throughout,No tachypnea, clear without wheezing, rales, or rhonchi Cardiovascular: RRR, PMI not displaced, heart sounds normal, no murmurs, gallops, bruit, thrill, or heave. Abdomen: BS normal. Soft without organomegaly, masses, lesions or tenderness. Extremities: without cyanosis, clubbing or edema. Good distal pulses bilateral SKin: Warm, no lesions or rashes  Musculoskeletal: No deformities Neuro: no focal signs  BP 184/80  Pulse 54  Ht 5' 6.5" (1.689 m)  Wt 161 lb (73.029 kg)  BMI 25.60 kg/m2  IEP:PIRJJ bradycardia with incomplete right bundle branch block poor R wave progression

## 2011-09-18 NOTE — Patient Instructions (Signed)
Your physician has recommended you make the following change in your medication: Please take Coreg 25 mg, take one tablet in the morning and 1/2 tablet in the evening.    Please monitor your blood pressures and call us with these values in one week.     Your physician recommends that you schedule a follow-up appointment in: 2 months with Dr. Shirlee Latch

## 2011-09-18 NOTE — Assessment & Plan Note (Signed)
Patient was admitted with chest pain and was positive for influenza A. Stress Myoview was negative for ischemia and showed normal LV function. CT Scan was negative for PE. She does have multiple cardiac risk factors for coronary artery disease. These need to be under tight control.

## 2011-09-18 NOTE — Assessment & Plan Note (Signed)
Patient's blood pressure is elevated today. I will increase her Coreg to 25 mg in morning 12-1/2 in the evening. She is bradycardic at 54 or I. Would increase it further. The patient has been on clonidine for 15 years and this was stopped in the hospital. I suspect her bradycardia will limit is using this with the Coreg. She takes her blood pressure twice daily and will call us with her readings.

## 2011-12-03 ENCOUNTER — Ambulatory Visit (INDEPENDENT_AMBULATORY_CARE_PROVIDER_SITE_OTHER): Payer: Medicare Other | Admitting: Cardiology

## 2011-12-03 ENCOUNTER — Encounter: Payer: Self-pay | Admitting: Cardiology

## 2011-12-03 DIAGNOSIS — I739 Peripheral vascular disease, unspecified: Secondary | ICD-10-CM

## 2011-12-03 DIAGNOSIS — R079 Chest pain, unspecified: Secondary | ICD-10-CM

## 2011-12-03 DIAGNOSIS — I10 Essential (primary) hypertension: Secondary | ICD-10-CM

## 2011-12-03 DIAGNOSIS — E785 Hyperlipidemia, unspecified: Secondary | ICD-10-CM

## 2011-12-03 NOTE — Assessment & Plan Note (Signed)
Good control on current regimen.  Continue.   

## 2011-12-03 NOTE — Patient Instructions (Signed)
Your physician wants you to follow-up in:  12 months.  You will receive a reminder letter in the mail two months in advance. If you don't receive a letter, please call our office to schedule the follow-up appointment.   

## 2011-12-03 NOTE — Progress Notes (Signed)
PCP: Dr. Wylene Simmer  77 yo with history of HTN, DM, hyperlipidemia, and PAD presents for cardiology followup.  I initially saw her in 12/12.  She was admitted to the hospital at that time with generalized ill feeling, vague chest pain, and fever.  Initial point of care troponin was elevated but three subsequent sets of cardiac enzymes were normal.  She tested positive for Influenza A.  Myoview done that admission showed no evidence for ischemia or infarction.    Since that admission, she has had no further chest pain.  She is not very active due to leg pain.  She gets bilateral leg and foot pain with most activity.  This has been attributed to a combination of PAD, sciatica, and diabetic peripheral neuropathy.  She is seen regularly by Dr. Edilia Bo for peripheral vascular assessment.  BP is now under much better control.    Labs (12/12): K 4.2, creatinine 0.9  PMH: 1. HTN 2. PAD: Followed by Dr. Edilia Bo.  Arteriogram in 11/10 showed bilateral SFA occlusions with distal reconstitution.  3. Diabetes mellitus 4. Hyperlipidemia 5. L-spine surgery x 4.  Sciatica.  6. Hiatal hernia.  7. Chest pain: Lexiscan myoview (12/12) with EF 75%, no ischemia or infarction.  Echo (12/12): EF 60-65%, no significant valvular abnormalities.   FH: Brother with CABG.  Mother with MI at 86.   SH: Married, 2 children.  Quit smoking in 1998.  Lives in [place].   Current Outpatient Prescriptions  Medication Sig Dispense Refill  . aspirin 81 MG tablet Take 81 mg by mouth daily.      . benazepril-hydrochlorthiazide (LOTENSIN HCT) 20-12.5 MG per tablet Take 1 tablet by mouth daily.        . carvedilol (COREG) 25 MG tablet Take one tablet every morning and 1/2 tablet every evening.  60 tablet  3  . furosemide (LASIX) 20 MG tablet Take 20 mg by mouth 2 (two) times daily.        Marland Kitchen glimepiride (AMARYL) 2 MG tablet Take 2 mg by mouth daily before breakfast.        . simvastatin (ZOCOR) 80 MG tablet Take 80  mg by mouth at bedtime.          BP 120/68  Pulse 60  Ht 5\' 6"  (1.676 m)  Wt 165 lb (74.844 kg)  BMI 26.63 kg/m2 General: NAD Neck: No JVD, no thyromegaly or thyroid nodule.  Lungs: Clear to auscultation bilaterally with normal respiratory effort. CV: Nondisplaced PMI.  Heart regular S1/S2, no S3/S4, 1/6 SEM RUSB.  No peripheral edema.  No carotid bruit.  Feet warm but unable to palpate pedal pulses.   Abdomen: Soft, nontender, no hepatosplenomegaly, no distention.  Neurologic: Alert and oriented x 3.  Psych: Normal affect. Extremities: No clubbing or cyanosis.

## 2011-12-03 NOTE — Assessment & Plan Note (Signed)
No recent chest pain.  She is at high risk for significant coronary disease given her multiple risk factors including PAD, DM, HTN, and hyperlipidemia.  However, myoview in 12/12 showed no ischemia or infarction.  Continue ASA 81, statin, ACEI, Coreg.

## 2011-12-03 NOTE — Assessment & Plan Note (Signed)
Goal LDL < 70.  She is on simvastatin 80 mg daily but has tolerated it for > 2 years with no problems so no definite need to change.  She will get labs at Dr. Deneen Harts office next month; I asked her to send a copy here.

## 2011-12-03 NOTE — Assessment & Plan Note (Signed)
Bilateral SFA occlusions followed by Dr. Edilia Bo.  Leg pain likely a combination of claudication, sciatica, and diabetic peripheral neuropathy.

## 2012-03-03 ENCOUNTER — Other Ambulatory Visit: Payer: Self-pay | Admitting: Physician Assistant

## 2012-11-05 ENCOUNTER — Other Ambulatory Visit: Payer: Self-pay | Admitting: Cardiology

## 2013-05-01 ENCOUNTER — Other Ambulatory Visit: Payer: Self-pay | Admitting: Cardiology

## 2013-05-06 ENCOUNTER — Encounter: Payer: Self-pay | Admitting: Physician Assistant

## 2013-05-07 ENCOUNTER — Encounter (HOSPITAL_COMMUNITY): Payer: Self-pay

## 2013-05-07 ENCOUNTER — Encounter (HOSPITAL_COMMUNITY): Payer: Self-pay | Admitting: Pharmacy Technician

## 2013-05-07 ENCOUNTER — Ambulatory Visit (INDEPENDENT_AMBULATORY_CARE_PROVIDER_SITE_OTHER): Payer: Medicare Other | Admitting: Physician Assistant

## 2013-05-07 ENCOUNTER — Encounter: Payer: Self-pay | Admitting: Physician Assistant

## 2013-05-07 VITALS — BP 170/80 | HR 60 | Ht 66.0 in | Wt 166.0 lb

## 2013-05-07 DIAGNOSIS — I10 Essential (primary) hypertension: Secondary | ICD-10-CM

## 2013-05-07 DIAGNOSIS — I208 Other forms of angina pectoris: Secondary | ICD-10-CM

## 2013-05-07 DIAGNOSIS — I739 Peripheral vascular disease, unspecified: Secondary | ICD-10-CM

## 2013-05-07 DIAGNOSIS — E785 Hyperlipidemia, unspecified: Secondary | ICD-10-CM

## 2013-05-07 DIAGNOSIS — I209 Angina pectoris, unspecified: Secondary | ICD-10-CM

## 2013-05-07 SURGERY — JV LEFT HEART CATHETERIZATION WITH CORONARY ANGIOGRAM
Anesthesia: LOCAL

## 2013-05-07 MED ORDER — ISOSORBIDE MONONITRATE ER 30 MG PO TB24: 30.0000 mg | ORAL_TABLET | Freq: Every day | ORAL | Status: DC

## 2013-05-07 MED ORDER — NITROGLYCERIN 0.4 MG SL SUBL: 0.4000 mg | SUBLINGUAL_TABLET | SUBLINGUAL | Status: DC | PRN

## 2013-05-07 NOTE — Patient Instructions (Signed)
AN RX FOR IMDUR 30MG  HAS BEEN SENT TO YOUR PHARMACY. TAKE 1 TABLET DAILY  AN RX FOR NITROSTAT HAS BEEN SENT TO YOUR PHARMACY AND INSTRUCTIONS GIVEN.  LABS TODAY: BMET, CBC, PT/INR  Your physician has requested that you have a cardiac catheterization. Cardiac catheterization is used to diagnose and/or treat various heart conditions. Doctors may recommend this procedure for a number of different reasons. The most common reason is to evaluate chest pain. Chest pain can be a symptom of coronary artery disease (CAD), and cardiac catheterization can show whether plaque is narrowing or blocking your heart's arteries. This procedure is also used to evaluate the valves, as well as measure the blood flow and oxygen levels in different parts of your heart. For further information please visit [email or web address] Please follow instruction sheet, as given.

## 2013-05-07 NOTE — Progress Notes (Signed)
[address] [place], [place]  [address] Phone: [phone] Fax:  [phone]  Date:  05/07/2013   ID:  Patricia Villegas, DOB 28-Mar-1935, MRN [id number]  PCP:  Gaspar Garbe, MD  Cardiologist:  Dr. [name]    History of Present Illness: Patricia Villegas is a 78 y.o. female who returns for evaluation of chest pain.   She has a history of HTN, DM2, HL and PAD. She was seen in 08/2011 in the hospital with vague chest discomfort. She tested positive for influenza A. Myoview was done and demonstrated no evidence of ischemia. Last seen by Dr. Shirlee Latch in 11/2011.  Over the last 2-3 weeks, she notes onset of chest pressure with exertion.  She notes it with going up hills or steps (CCS II-III).  She also notes it sometimes after meals.  She initially thought it was indigestion.  Her symptoms are getting worse.  She notes radiation into her jaw and teeth, associated dyspnea and diaphoresis.  No syncope.  No orthopnea, PND.  She has mild stable pedal edema.    Labs (12/12):   K 4.2, Cr 0.9, Hgb 12   Wt Readings from Last 3 Encounters:  12/03/11 165 lb (74.844 kg)  09/18/11 161 lb (73.029 kg)  08/24/11 160 lb 15 oz (73 kg)     Past Medical History  Diagnosis Date  . Diabetes mellitus     Greater than 20 years  . Hypertension     Greater than 20 years  . Hypercholesteremia   . [name] (peripheral vascular disease)     Stable claudication. Arteriogram 07/2009 no significant aortoiliac disease, has bilateral SFA occlusions with distal reconstitution   . Closed left fibular fracture     Casted   . Lumbar disc disease     S/p surgery x4;  Sciatica   . Complication of anesthesia 1998    Unable to arouse fully, respiratory depression  . Anemia   . Blood transfusion   . Transfusion reaction 1960    Convulsions (? seizure)  . H/O hiatal hernia   . Parastomal hernia of ileal conduit 2009  . Arthritis   . Anxiety   . Depression   . Chest pain     a. Lexiscan Myoview  12/12:  EF 75%, no ischemia or scar;  b.  Echo 12/12:  EF 60-65%, no sig valvular abnormalities     Current Outpatient Prescriptions  Medication Sig Dispense Refill  . aspirin 81 MG tablet Take 81 mg by mouth daily.      . benazepril-hydrochlorthiazide (LOTENSIN HCT) 20-12.5 MG per tablet Take 1 tablet by mouth daily.        . carvedilol (COREG) 25 MG tablet TAKE 1 TABLET BY MOUTH EVERY MORNING AND 1/2 TABLET EVERY EVENING. (NEED OFFICE VISIT FOR FURTHER REFILLS)  45 tablet  0  . furosemide (LASIX) 20 MG tablet Take 20 mg by mouth 2 (two) times daily.        Marland Kitchen glimepiride (AMARYL) 2 MG tablet Take 2 mg by mouth daily before breakfast.        . simvastatin (ZOCOR) 80 MG tablet Take 80 mg by mouth at bedtime.         No current facility-administered medications for this visit.    Allergies:   No Known Allergies  Social History:  The patient  reports that she quit smoking about 16 years ago. Her smoking use included Cigarettes. She has a 5 pack-year smoking history. She does  not have any smokeless tobacco history on file. She reports that she does not drink alcohol or use illicit drugs.   Family History:  The patient's family history includes Cancer in her brother and sister; Coronary artery disease (age of onset: 83) in her mother; Diabetes in her brother and mother; Heart attack in her mother; Stroke in her mother.   ROS:  Please see the history of present illness.      All other systems reviewed and negative.   PHYSICAL EXAM: VS:  BP 170/80  Pulse 60  Ht 5\' 6"  (1.676 m)  Wt 166 lb (75.297 kg)  BMI 26.81 kg/m2 Well nourished, well developed, in no acute distress HEENT: normal Neck: no JVD Cardiac:  normal S1, S2; RRR; 1-2/6 systolic murmur along LLSB Lungs:  clear to auscultation bilaterally, no wheezing, rhonchi or rales Abd: soft, nontender, no hepatomegaly Ext: no edema Skin: warm and dry Neuro:  CNs 2-12 intact, no focal abnormalities noted  EKG:  NSR, HR 64, LAD, ~ 1mm or  less ST depression in 1, aVL     ASSESSMENT AND PLAN:  1. Exertional Angina:  Her symptoms are concerning for ischemia.  She describes CCS Class III symptoms.  She has multiple risk factors for CAD.  I have recommended cardiac catheterization to her.  I discussed with Dr. Lewayne Bunting (DOD) as well who agreed.  Risks and benefits of cardiac catheterization have been discussed with the patient.  These include bleeding, infection, kidney damage, stroke, heart attack, death.  The patient understands these risks and is willing to proceed.  We will schedule her with Dr. [name] next week.  Start Imdur 30 mg QD.  Will give Rx for SL NTG.  Continue ASA, beta blocker, statin.  Would not start Plavix as she likely has multivessel disease.  She knows to go to the ED if she has worsening symptoms.   2. Hypertension:  Add Imdur as noted.  Continue other meds as listed. 3. Hyperlipidemia:  Continue statin.  She has been on Simvastatin 80 for a long time.  4. PAD:  Stable.  Continue ASA, statin. 5. Rheumatoid Arthritis:  Continue follow up with Rheumatology.  6. Disposition:  Plan cardiac cath next week.   Signed, Tereso Newcomer, PA-C  05/07/2013 8:59 AM

## 2013-05-07 NOTE — H&P (Signed)
History and Physical  Date:  05/07/2013   ID:  Patricia Villegas, DOB October 20, 1934, MRN [id number]  PCP:  Gaspar Garbe, MD  Cardiologist:  Dr. [name]    History of Present Illness: Patricia Villegas is a 78 y.o. female who returns for evaluation of chest pain.   She has a history of HTN, DM2, HL and PAD. She was seen in 08/2011 in the hospital with vague chest discomfort. She tested positive for influenza A. Myoview was done and demonstrated no evidence of ischemia. Last seen by Dr. Shirlee Latch in 11/2011.  Over the last 2-3 weeks, she notes onset of chest pressure with exertion.  She notes it with going up hills or steps (CCS II-III).  She also notes it sometimes after meals.  She initially thought it was indigestion.  Her symptoms are getting worse.  She notes radiation into her jaw and teeth, associated dyspnea and diaphoresis.  No syncope.  No orthopnea, PND.  She has mild stable pedal edema.    Labs (12/12):   K 4.2, Cr 0.9, Hgb 12   Wt Readings from Last 3 Encounters:  12/03/11 165 lb (74.844 kg)  09/18/11 161 lb (73.029 kg)  08/24/11 160 lb 15 oz (73 kg)     Past Medical History  Diagnosis Date  . Diabetes mellitus     Greater than 20 years  . Hypertension     Greater than 20 years  . Hypercholesteremia   . [name] (peripheral vascular disease)     Stable claudication. Arteriogram 07/2009 no significant aortoiliac disease, has bilateral SFA occlusions with distal reconstitution   . Closed left fibular fracture     Casted   . Lumbar disc disease     S/p surgery x4;  Sciatica   . Complication of anesthesia 1998    Unable to arouse fully, respiratory depression  . Anemia   . Blood transfusion   . Transfusion reaction 1960    Convulsions (? seizure)  . H/O hiatal hernia   . Parastomal hernia of ileal conduit 2009  . Arthritis   . Anxiety   . Depression   . Chest pain     a. Lexiscan Myoview 12/12:  EF 75%, no ischemia or scar;  b.  Echo 12/12:  EF 60-65%, no sig valvular  abnormalities     Current Outpatient Prescriptions  Medication Sig Dispense Refill  . aspirin 81 MG tablet Take 81 mg by mouth daily.      . benazepril-hydrochlorthiazide (LOTENSIN HCT) 20-12.5 MG per tablet Take 1 tablet by mouth daily.        . carvedilol (COREG) 25 MG tablet TAKE 1 TABLET BY MOUTH EVERY MORNING AND 1/2 TABLET EVERY EVENING. (NEED OFFICE VISIT FOR FURTHER REFILLS)  45 tablet  0  . furosemide (LASIX) 20 MG tablet Take 20 mg by mouth 2 (two) times daily.        Marland Kitchen glimepiride (AMARYL) 2 MG tablet Take 2 mg by mouth daily before breakfast.        . simvastatin (ZOCOR) 80 MG tablet Take 80 mg by mouth at bedtime.         No current facility-administered medications for this visit.    Allergies:   No Known Allergies  Social History:  The patient  reports that she quit smoking about 16 years ago. Her smoking use included Cigarettes. She has a 5 pack-year smoking history. She does not have any smokeless tobacco history on file. She reports that she does not  drink alcohol or use illicit drugs.   Family History:  The patient's family history includes Cancer in her brother and sister; Coronary artery disease (age of onset: 80) in her mother; Diabetes in her brother and mother; Heart attack in her mother; Stroke in her mother.   ROS:  Please see the history of present illness.      All other systems reviewed and negative.   PHYSICAL EXAM: VS:  BP 170/80  Pulse 60  Ht 5\' 6"  (1.676 m)  Wt 166 lb (75.297 kg)  BMI 26.81 kg/m2 Well nourished, well developed, in no acute distress HEENT: normal Neck: no JVD Cardiac:  normal S1, S2; RRR; 1-2/6 systolic murmur along LLSB Lungs:  clear to auscultation bilaterally, no wheezing, rhonchi or rales Abd: soft, nontender, no hepatomegaly Ext: no edema Skin: warm and dry Neuro:  CNs 2-12 intact, no focal abnormalities noted  EKG:  NSR, HR 64, LAD, ~ 1mm or less ST depression in 1, aVL     ASSESSMENT AND PLAN:  1. Exertional Angina:   Her symptoms are concerning for ischemia.  She describes CCS Class III symptoms.  She has multiple risk factors for CAD.  I have recommended cardiac catheterization to her.  I discussed with Dr. Lewayne Bunting (DOD) as well who agreed.  Risks and benefits of cardiac catheterization have been discussed with the patient.  These include bleeding, infection, kidney damage, stroke, heart attack, death.  The patient understands these risks and is willing to proceed.  We will schedule her with Dr. [name] next week.  Start Imdur 30 mg QD.  Will give Rx for SL NTG.  Continue ASA, beta blocker, statin.  Would not start Plavix as she likely has multivessel disease.  She knows to go to the ED if she has worsening symptoms.   2. Hypertension:  Add Imdur as noted.  Continue other meds as listed. 3. Hyperlipidemia:  Continue statin.  She has been on Simvastatin 80 for a long time.  4. PAD:  Stable.  Continue ASA, statin. 5. Rheumatoid Arthritis:  Continue follow up with Rheumatology.  6. Disposition:  Plan cardiac cath next week.   Signed, Tereso Newcomer, PA-C  05/07/2013 8:59 AM

## 2013-05-09 NOTE — Progress Notes (Signed)
Patricia Villegas, please make sure this patient gets set up for me for cath next week one day I am in hospital.  Can be in [facility] lab, radial access.

## 2013-05-11 ENCOUNTER — Telehealth: Payer: Self-pay | Admitting: *Deleted

## 2013-05-11 ENCOUNTER — Ambulatory Visit (HOSPITAL_COMMUNITY): Admit: 2013-05-11 | Payer: Self-pay | Admitting: Cardiology

## 2013-05-11 NOTE — Telephone Encounter (Signed)
pt notified about her lab results ok for cath 05/14/13, pt verbalized understanding

## 2013-05-14 ENCOUNTER — Ambulatory Visit (HOSPITAL_COMMUNITY)
Admission: RE | Admit: 2013-05-14 | Discharge: 2013-05-14 | Disposition: A | Discharge status: Home / Self Care | Payer: Medicare Other | Source: Ambulatory Visit | Attending: Cardiology | Admitting: Cardiology

## 2013-05-14 ENCOUNTER — Encounter (HOSPITAL_COMMUNITY)
Admission: RE | Disposition: A | Discharge status: Home / Self Care | Payer: Self-pay | Source: Ambulatory Visit | Attending: Cardiology

## 2013-05-14 DIAGNOSIS — Z823 Family history of stroke: Secondary | ICD-10-CM | POA: Insufficient documentation

## 2013-05-14 DIAGNOSIS — I208 Other forms of angina pectoris: Secondary | ICD-10-CM

## 2013-05-14 DIAGNOSIS — E119 Type 2 diabetes mellitus without complications: Secondary | ICD-10-CM | POA: Insufficient documentation

## 2013-05-14 DIAGNOSIS — F329 Major depressive disorder, single episode, unspecified: Secondary | ICD-10-CM | POA: Insufficient documentation

## 2013-05-14 DIAGNOSIS — E785 Hyperlipidemia, unspecified: Secondary | ICD-10-CM | POA: Insufficient documentation

## 2013-05-14 DIAGNOSIS — D649 Anemia, unspecified: Secondary | ICD-10-CM | POA: Insufficient documentation

## 2013-05-14 DIAGNOSIS — I2510 Atherosclerotic heart disease of native coronary artery without angina pectoris: Secondary | ICD-10-CM

## 2013-05-14 DIAGNOSIS — Z8249 Family history of ischemic heart disease and other diseases of the circulatory system: Secondary | ICD-10-CM | POA: Insufficient documentation

## 2013-05-14 DIAGNOSIS — I10 Essential (primary) hypertension: Secondary | ICD-10-CM | POA: Insufficient documentation

## 2013-05-14 DIAGNOSIS — I739 Peripheral vascular disease, unspecified: Secondary | ICD-10-CM | POA: Insufficient documentation

## 2013-05-14 DIAGNOSIS — Z7982 Long term (current) use of aspirin: Secondary | ICD-10-CM | POA: Insufficient documentation

## 2013-05-14 DIAGNOSIS — Z87891 Personal history of nicotine dependence: Secondary | ICD-10-CM | POA: Insufficient documentation

## 2013-05-14 DIAGNOSIS — F3289 Other specified depressive episodes: Secondary | ICD-10-CM | POA: Insufficient documentation

## 2013-05-14 DIAGNOSIS — F411 Generalized anxiety disorder: Secondary | ICD-10-CM | POA: Insufficient documentation

## 2013-05-14 DIAGNOSIS — Z79899 Other long term (current) drug therapy: Secondary | ICD-10-CM | POA: Insufficient documentation

## 2013-05-14 DIAGNOSIS — M129 Arthropathy, unspecified: Secondary | ICD-10-CM | POA: Insufficient documentation

## 2013-05-14 DIAGNOSIS — R011 Cardiac murmur, unspecified: Secondary | ICD-10-CM | POA: Insufficient documentation

## 2013-05-14 DIAGNOSIS — M5430 Sciatica, unspecified side: Secondary | ICD-10-CM | POA: Insufficient documentation

## 2013-05-14 DIAGNOSIS — I209 Angina pectoris, unspecified: Secondary | ICD-10-CM | POA: Insufficient documentation

## 2013-05-14 HISTORY — PX: LEFT HEART CATHETERIZATION WITH CORONARY ANGIOGRAM: SHX5451

## 2013-05-14 LAB — GLUCOSE, CAPILLARY: Glucose-Capillary: 69 mg/dL — ABNORMAL LOW (ref 70–99)

## 2013-05-14 SURGERY — LEFT HEART CATHETERIZATION WITH CORONARY ANGIOGRAM
Anesthesia: LOCAL

## 2013-05-14 MED ORDER — NITROGLYCERIN 0.2 MG/ML ON CALL CATH LAB
INTRAVENOUS | Status: AC
  Filled 2013-05-14: qty 1

## 2013-05-14 MED ORDER — ONDANSETRON HCL 4 MG/2ML IJ SOLN: 4.0000 mg | Freq: Four times a day (QID) | INTRAMUSCULAR | Status: DC | PRN

## 2013-05-14 MED ORDER — HEPARIN SODIUM (PORCINE) 1000 UNIT/ML IJ SOLN
INTRAMUSCULAR | Status: AC
  Filled 2013-05-14: qty 1

## 2013-05-14 MED ORDER — SODIUM CHLORIDE 0.9 % IJ SOLN: 3.0000 mL | Freq: Two times a day (BID) | INTRAMUSCULAR | Status: DC

## 2013-05-14 MED ORDER — SODIUM CHLORIDE 0.9 % IJ SOLN: 3.0000 mL | INTRAMUSCULAR | Status: DC | PRN

## 2013-05-14 MED ORDER — HEPARIN (PORCINE) IN NACL 2-0.9 UNIT/ML-% IJ SOLN
INTRAMUSCULAR | Status: AC
  Filled 2013-05-14: qty 1000

## 2013-05-14 MED ORDER — FENTANYL CITRATE 0.05 MG/ML IJ SOLN
INTRAMUSCULAR | Status: AC
  Filled 2013-05-14: qty 2

## 2013-05-14 MED ORDER — MIDAZOLAM HCL 2 MG/2ML IJ SOLN
INTRAMUSCULAR | Status: AC
  Filled 2013-05-14: qty 2

## 2013-05-14 MED ORDER — ACETAMINOPHEN 325 MG PO TABS: 650.0000 mg | ORAL_TABLET | ORAL | Status: DC | PRN

## 2013-05-14 MED ORDER — LIDOCAINE HCL (PF) 1 % IJ SOLN
INTRAMUSCULAR | Status: AC
  Filled 2013-05-14: qty 30

## 2013-05-14 MED ORDER — ASPIRIN 81 MG PO CHEW
324.0000 mg | CHEWABLE_TABLET | ORAL | Status: AC
  Administered 2013-05-14: 324 mg via ORAL
  Filled 2013-05-14: qty 4

## 2013-05-14 MED ORDER — SODIUM CHLORIDE 0.9 % IV SOLN
INTRAVENOUS | Status: DC
  Administered 2013-05-14: 07:00:00 via INTRAVENOUS

## 2013-05-14 MED ORDER — SODIUM CHLORIDE 0.9 % IV SOLN: 250.0000 mL | INTRAVENOUS | Status: DC | PRN

## 2013-05-14 NOTE — CV Procedure (Addendum)
   Cardiac Catheterization Procedure Note  Name: Patricia Villegas MRN: [id number] DOB: [birth year]  Procedure: Left Heart Cath, Selective Coronary Angiography, LV angiography  Indication: Angina, CCS 3.  Has been on Coreg and Imdur.  History of diabetes and rheumatoid arthritis.    Procedural Details: The right wrist was prepped, draped, and anesthetized with 1% lidocaine. Using the modified Seldinger technique, a 5 French sheath was introduced into the right radial artery. 3 mg of verapamil was administered through the sheath, weight-based unfractionated heparin was administered intravenously. Standard Judkins catheters were used for selective coronary angiography and left ventriculography. Catheter exchanges were performed over an exchange length guidewire. There were no immediate procedural complications. A TR band was used for radial hemostasis at the completion of the procedure.  The patient was transferred to the post catheterization recovery area for further monitoring.  Procedural Findings: Hemodynamics: AO 131/52 LV 141/28  Coronary angiography: Coronary dominance: Left  Left mainstem: Distal left main tapering, 30% stenosis.   Left anterior descending (LAD): 80% ostial stenosis, 30% proximal stenosis, 30% distal stenosis.   Left circumflex (LCx): 95% ostial LCx stenosis.  The LCx is a large, dominant vessel.   Right coronary artery (RCA): Small, non-dominant, 95% long proximal to mid vessel stenosis.   Left ventriculography: Hand LV-gram done with elevated LVEDP.  EF 55-60%.   Final Conclusions:  Severe 3 vessel disease with preserved EF.  There is a very severe ostial LCx stenosis, the LCx is a large, dominant vessel.  Discussed with Dr. Clifton James, not a good candidate for PCI.  She has CCS 3 anginal symptoms on Coreg and Imdur.  I will arrange for CVTS evaluation for CABG.  She has not had chest pain at rest or with minor exertion since starting Imdur so will let her go home  today.  Will get echo to assess valves.  She will need close followup, will try to arrange CVTS evaluation today or Monday.  Family understands to call EMS with any prolonged chest pain.   Patricia Villegas 05/14/2013 9:01 AM

## 2013-05-14 NOTE — Progress Notes (Signed)
Echo lab called regarding 2D echo ordered post procedure. Per echo lab- 2D echo cannot be done without insurance preauthorization. Dr. Shirlee Latch notified. 2D echo scheduled as outpt for Mon 9/8.

## 2013-05-14 NOTE — Interval H&P Note (Signed)
History and Physical Interval Note:  05/14/2013 7:46 AM  Patricia Villegas  has presented today for surgery, with the diagnosis of Chest pain  The various methods of treatment have been discussed with the patient and family. After consideration of risks, benefits and other options for treatment, the patient has consented to  Procedure(s): LEFT HEART CATHETERIZATION WITH CORONARY ANGIOGRAM (N/A) as a surgical intervention .  The patient's history has been reviewed, patient examined, no change in status, stable for surgery.  I have reviewed the patient's chart and labs.  Questions were answered to the patient's satisfaction.     Arrietty Dercole [employer]

## 2013-05-17 ENCOUNTER — Ambulatory Visit (HOSPITAL_COMMUNITY): Payer: Medicare Other | Attending: Cardiology | Admitting: Radiology

## 2013-05-17 ENCOUNTER — Other Ambulatory Visit (HOSPITAL_COMMUNITY): Payer: Self-pay | Admitting: Cardiology

## 2013-05-17 DIAGNOSIS — I10 Essential (primary) hypertension: Secondary | ICD-10-CM | POA: Insufficient documentation

## 2013-05-17 DIAGNOSIS — E119 Type 2 diabetes mellitus without complications: Secondary | ICD-10-CM | POA: Insufficient documentation

## 2013-05-17 DIAGNOSIS — E785 Hyperlipidemia, unspecified: Secondary | ICD-10-CM | POA: Insufficient documentation

## 2013-05-17 DIAGNOSIS — I079 Rheumatic tricuspid valve disease, unspecified: Secondary | ICD-10-CM | POA: Insufficient documentation

## 2013-05-17 DIAGNOSIS — R079 Chest pain, unspecified: Secondary | ICD-10-CM | POA: Insufficient documentation

## 2013-05-17 DIAGNOSIS — I25810 Atherosclerosis of coronary artery bypass graft(s) without angina pectoris: Secondary | ICD-10-CM

## 2013-05-17 DIAGNOSIS — R011 Cardiac murmur, unspecified: Secondary | ICD-10-CM | POA: Insufficient documentation

## 2013-05-17 DIAGNOSIS — I2510 Atherosclerotic heart disease of native coronary artery without angina pectoris: Secondary | ICD-10-CM | POA: Insufficient documentation

## 2013-05-17 DIAGNOSIS — I080 Rheumatic disorders of both mitral and aortic valves: Secondary | ICD-10-CM | POA: Insufficient documentation

## 2013-05-17 NOTE — Progress Notes (Signed)
Echocardiogram performed.  

## 2013-05-18 ENCOUNTER — Encounter: Payer: Self-pay | Admitting: Thoracic Surgery (Cardiothoracic Vascular Surgery)

## 2013-05-18 ENCOUNTER — Other Ambulatory Visit: Payer: Self-pay | Admitting: *Deleted

## 2013-05-18 ENCOUNTER — Encounter (HOSPITAL_COMMUNITY): Payer: Self-pay | Admitting: Pharmacy Technician

## 2013-05-18 ENCOUNTER — Institutional Professional Consult (permissible substitution) (INDEPENDENT_AMBULATORY_CARE_PROVIDER_SITE_OTHER): Payer: Medicare Other | Admitting: Thoracic Surgery (Cardiothoracic Vascular Surgery)

## 2013-05-18 VITALS — BP 178/80 | HR 59 | Resp 16 | Ht 66.0 in | Wt 166.0 lb

## 2013-05-18 DIAGNOSIS — I2510 Atherosclerotic heart disease of native coronary artery without angina pectoris: Secondary | ICD-10-CM

## 2013-05-18 DIAGNOSIS — Z01810 Encounter for preprocedural cardiovascular examination: Secondary | ICD-10-CM

## 2013-05-18 DIAGNOSIS — I25810 Atherosclerosis of coronary artery bypass graft(s) without angina pectoris: Secondary | ICD-10-CM | POA: Insufficient documentation

## 2013-05-18 DIAGNOSIS — I749 Embolism and thrombosis of unspecified artery: Secondary | ICD-10-CM

## 2013-05-18 DIAGNOSIS — M069 Rheumatoid arthritis, unspecified: Secondary | ICD-10-CM

## 2013-05-18 DIAGNOSIS — I70202 Unspecified atherosclerosis of native arteries of extremities, left leg: Secondary | ICD-10-CM

## 2013-05-18 DIAGNOSIS — I209 Angina pectoris, unspecified: Secondary | ICD-10-CM

## 2013-05-18 DIAGNOSIS — E139 Other specified diabetes mellitus without complications: Secondary | ICD-10-CM

## 2013-05-18 DIAGNOSIS — E119 Type 2 diabetes mellitus without complications: Secondary | ICD-10-CM

## 2013-05-18 NOTE — Progress Notes (Signed)
PCP is Villegas,Patricia W, MD Referring Provider is Villegas, Patricia S, MD  Chief Complaint  Patient presents with  . Coronary Artery Disease    eval for CABG...CATHED 05/14/13  . Chest Pain    HPI: 78-year-old woman with multiple cardiac risk factors presents with a chief complaint of chest pain with exertion and after eating.  [name] is a 78-year-old woman with multiple cardiac risk factors including type 2 diabetes, hypertension, hyperlipidemia, and peripheral arterial disease. She has been feeling poorly for some time now. She complains of a burning sensation in her chest which radiates to her neck jaw and teeth. This occurs with exertion and also after eating. She initially thought this might be indigestion. She also gets shortness of breath with exertion although is not always correlate exactly with the chest discomfort. She will get short of breath when she walks up an incline or when walking up stairs. She has been having dizzy spells she first stands up. She has not had any syncope.  She saw Dr. [name] and he recommended cardiac catheterization, which was done on September 5. It revealed severe three-vessel coronary disease with a left dominant circulation. She had preserved left ventricular function. She also had heavy mitral annular calcification. Echocardiogram was done on 9/8 which showed preserved left ventricular function and minimal mitral regurgitation.  She has not had any rest or nocturnal symptoms.  She does have claudication. She is followed for known bilateral SFA occlusions by Dr. Christopher Dixon. She also has rheumatoid arthritis involving her shoulders and neck. She is on methotrexate and prednisone for that. She's also been told she needs a left hip replacement.  She is very anxious about having bypass grafting because her mother had CABG in [place] in 1985 and ended up "losing both her legs".  Past Medical History  Diagnosis Date  . Diabetes mellitus     Greater  than 20 years  . Hypertension     Greater than 20 years  . Hypercholesteremia   . [name] (peripheral vascular disease)     Stable claudication. Arteriogram 07/2009 no significant aortoiliac disease, has bilateral SFA occlusions with distal reconstitution   . Closed left fibular fracture     Casted   . Lumbar disc disease     S/p surgery x4;  Sciatica   . Complication of anesthesia 1998    Unable to arouse fully, respiratory depression  . Anemia   . Blood transfusion   . Transfusion reaction 1960    Convulsions (? seizure)  . H/O hiatal hernia   . Parastomal hernia of ileal conduit 2009  . Arthritis   . Anxiety   . Depression   . Chest pain     a. Lexiscan Myoview 12/12:  EF 75%, no ischemia or scar;  b.  Echo 12/12:  EF 60-65%, no sig valvular abnormalities     Past Surgical History  Procedure Laterality Date  . Hernia repair  2009  . Back surgery      4 back operations most recently 1995, 1998    Family History  Problem Relation Age of Onset  . Coronary artery disease Mother 55  . Diabetes Mother   . Stroke Mother   . Heart attack Mother   . Cancer Brother   . Heart disease Brother   . Stroke Brother   . Kidney disease Brother   . Cancer Sister   . Diabetes Brother     Social History History  Substance Use Topics  . Smoking   status: Former Smoker -- 0.50 packs/day for 10 years    Types: Cigarettes    Quit date: 09/09/1996  . Smokeless tobacco: Former User    Types: Chew  . Alcohol Use: No    Current Outpatient Prescriptions  Medication Sig Dispense Refill  . aspirin 81 MG tablet Take 81 mg by mouth daily.      . benazepril (LOTENSIN) 20 MG tablet Take 20 mg by mouth daily.       . carvedilol (COREG) 25 MG tablet Take 12.5-25 mg by mouth 2 (two) times daily with a meal. Takes 25 mg in am and 12.5 mg in pm.      . folic acid (FOLVITE) 1 MG tablet Take 1 mg by mouth daily.      . furosemide (LASIX) 20 MG tablet Take 20 mg by mouth 2 (two) times daily.       .  glimepiride (AMARYL) 4 MG tablet Take 4 mg by mouth 2 (two) times daily.      . isosorbide mononitrate (IMDUR) 30 MG 24 hr tablet Take 1 tablet (30 mg total) by mouth daily.  90 tablet  3  . JANUVIA 100 MG tablet Take 100 mg by mouth daily.       . methotrexate (RHEUMATREX) 2.5 MG tablet Take 10 mg by mouth once a week.       . nitroGLYCERIN (NITROSTAT) 0.4 MG SL tablet Place 1 tablet (0.4 mg total) under the tongue every 5 (five) minutes as needed for chest pain. Up to 3 tablet. If pain does not subside call 911  25 tablet  2  . predniSONE (DELTASONE) 5 MG tablet Take 7.5 mg by mouth daily. DOSE PACK      . simvastatin (ZOCOR) 80 MG tablet Take 80 mg by mouth at bedtime.         No current facility-administered medications for this visit.    No Known Allergies  Review of Systems  Constitutional: Positive for activity change and fatigue.  Respiratory: Positive for chest tightness and shortness of breath. Negative for wheezing.   Cardiovascular: Positive for chest pain and leg swelling.       Claudication bilaterally  Musculoskeletal: Positive for back pain, joint swelling and arthralgias.  Neurological: Positive for dizziness and numbness.  Hematological: Bruises/bleeds easily.  Psychiatric/Behavioral: The patient is nervous/anxious.   All other systems reviewed and are negative.    BP 178/80  Pulse 59  Resp 16  Ht 5' 6" (1.676 m)  Wt 166 lb (75.297 kg)  BMI 26.81 kg/m2  SpO2 97% Physical Exam  Vitals reviewed. Constitutional: She is oriented to person, place, and time. She appears well-developed and well-nourished. No distress.  HENT:  Head: Normocephalic and atraumatic.  Eyes: EOM are normal. Pupils are equal, round, and reactive to light.  Neck: Neck supple. No thyromegaly present.  No carotid bruits  Cardiovascular: Normal rate, regular rhythm and normal heart sounds.  Exam reveals no gallop and no friction rub.   No murmur heard. No palpable dorsalis pedis or posterior  tibial bilaterally  Pulmonary/Chest: Effort normal and breath sounds normal. She has no wheezes. She has no rales.  Abdominal: Soft. There is no tenderness.  Musculoskeletal: She exhibits edema (1+).  Lymphadenopathy:    She has no cervical adenopathy.  Neurological: She is alert and oriented to person, place, and time. No cranial nerve deficit.  No focal motor deficit  Skin: Skin is warm and dry.     Diagnostic Tests: Cardiac catheterization   Procedural Findings:  Hemodynamics:  AO 131/52  LV 141/28  Coronary angiography:  Coronary dominance: Left  Left mainstem: Distal left main tapering, 30% stenosis.  Left anterior descending (LAD): 80% ostial stenosis, 30% proximal stenosis, 30% distal stenosis.  Left circumflex (LCx): 95% ostial LCx stenosis. The LCx is a large, dominant vessel.  Right coronary artery (RCA): Small, non-dominant, 95% long proximal to mid vessel stenosis.  Left ventriculography: Hand LV-gram done with elevated LVEDP. EF 55-60%.  Final Conclusions: Severe 3 vessel disease with preserved EF. There is a very severe ostial LCx stenosis, the LCx is a large, dominant vessel. Discussed with Dr. McAlhany, not a good candidate for PCI. She has CCS 3 anginal symptoms on Coreg and Imdur. I will arrange for CVTS evaluation for CABG. She has not had chest pain at rest or with minor exertion since starting Imdur so will let her go home today. Will get echo to assess valves. She will need close followup, will try to arrange CVTS evaluation today or Monday. Family understands to call EMS with any prolonged chest pain.  Patricia Villegas  05/14/2013  9:01 AM  Echocardiogram Study Conclusions  - Left ventricle: The cavity size was normal. Wall thickness was increased in a pattern of moderate LVH. There was mild concentric hypertrophy. Systolic function was normal. The estimated ejection fraction was in the range of 60% to 65%. Wall motion was normal; there were no regional  wall motion abnormalities. Doppler parameters are consistent with abnormal left ventricular relaxation (grade 1 diastolic dysfunction). Doppler parameters are consistent with high ventricular filling pressure. - Aortic valve: Trivial regurgitation. - Mitral valve: Calcified annulus. Normal thickness leaflets . Mild regurgitation. - Left atrium: The atrium was mildly dilated. - Pulmonary arteries: Systolic pressure was mildly increased. PA peak pressure: 43mm Hg (S).   Impression: 78-year-old woman with multiple cardiac risk factors who presents with class III angina. She had severe three-vessel coronary disease with preserved left ventricular function. Her lesions are not amenable to percutaneous intervention. Coronary bypass grafting is indicated for survival benefit and relief of symptoms.  I discussed with the patient and her husband the general nature of the procedure, the need for general anesthesia, and the incisions to be used. We discussed the expected hospital stay, overall recovery and short and long term outcomes. We discussed the indications, risks, benefits, and alternatives. They understand the risks include, but are not limited to death, stroke, MI, DVT/PE, bleeding, possible need for transfusion, infections, cardiac arrhythmias, and other organ system dysfunction including respiratory, renal, or GI complications. I told her that it would be extremely unusual to lose a leg after a saphenous vein harvest, but it is not completely impossible. She accepts the risks and agrees to proceed.  Plan: Coronary bypass grafting on Monday, September 15. She will be admitted on the day of surgery.  Will need stress dose steroids due to long-term prednisone  She needs carotid duplex to r/o ECCOD 

## 2013-05-19 ENCOUNTER — Encounter (HOSPITAL_COMMUNITY)
Admission: RE | Admit: 2013-05-19 | Discharge: 2013-05-19 | Disposition: A | Discharge status: Home / Self Care | Payer: Medicare Other | Source: Ambulatory Visit | Attending: Thoracic Surgery (Cardiothoracic Vascular Surgery) | Admitting: Thoracic Surgery (Cardiothoracic Vascular Surgery)

## 2013-05-19 ENCOUNTER — Ambulatory Visit (HOSPITAL_COMMUNITY)
Admission: RE | Admit: 2013-05-19 | Discharge: 2013-05-19 | Disposition: A | Discharge status: Home / Self Care | Payer: Medicare Other | Source: Ambulatory Visit | Attending: Thoracic Surgery (Cardiothoracic Vascular Surgery) | Admitting: Thoracic Surgery (Cardiothoracic Vascular Surgery)

## 2013-05-19 ENCOUNTER — Encounter (HOSPITAL_COMMUNITY): Payer: Self-pay

## 2013-05-19 VITALS — BP 109/66 | HR 70 | Temp 98.3°F | Resp 18 | Ht 66.0 in | Wt 171.1 lb

## 2013-05-19 DIAGNOSIS — I2510 Atherosclerotic heart disease of native coronary artery without angina pectoris: Secondary | ICD-10-CM

## 2013-05-19 DIAGNOSIS — E119 Type 2 diabetes mellitus without complications: Secondary | ICD-10-CM | POA: Insufficient documentation

## 2013-05-19 DIAGNOSIS — I700 Atherosclerosis of aorta: Secondary | ICD-10-CM | POA: Insufficient documentation

## 2013-05-19 DIAGNOSIS — Z01812 Encounter for preprocedural laboratory examination: Secondary | ICD-10-CM | POA: Insufficient documentation

## 2013-05-19 DIAGNOSIS — Z01818 Encounter for other preprocedural examination: Secondary | ICD-10-CM | POA: Insufficient documentation

## 2013-05-19 DIAGNOSIS — I739 Peripheral vascular disease, unspecified: Secondary | ICD-10-CM | POA: Insufficient documentation

## 2013-05-19 DIAGNOSIS — M899 Disorder of bone, unspecified: Secondary | ICD-10-CM | POA: Insufficient documentation

## 2013-05-19 DIAGNOSIS — Z01810 Encounter for preprocedural cardiovascular examination: Secondary | ICD-10-CM | POA: Insufficient documentation

## 2013-05-19 HISTORY — DX: Other specified diabetes mellitus with diabetic neuropathy, unspecified: E13.40

## 2013-05-19 LAB — CBC
HCT: 33.3 % — ABNORMAL LOW (ref 36.0–46.0)
Hemoglobin: 11.2 g/dL — ABNORMAL LOW (ref 12.0–15.0)
MCH: 31.9 pg (ref 26.0–34.0)
MCHC: 33.6 g/dL (ref 30.0–36.0)
MCV: 94.9 fL (ref 78.0–100.0)
RBC: 3.51 MIL/uL — ABNORMAL LOW (ref 3.87–5.11)

## 2013-05-19 LAB — PULMONARY FUNCTION TEST

## 2013-05-19 LAB — URINALYSIS, ROUTINE W REFLEX MICROSCOPIC
Bilirubin Urine: NEGATIVE
Nitrite: NEGATIVE
Specific Gravity, Urine: 1.02 (ref 1.005–1.030)
Urobilinogen, UA: 0.2 mg/dL (ref 0.0–1.0)
pH: 6 (ref 5.0–8.0)

## 2013-05-19 LAB — HEMOGLOBIN A1C: Hgb A1c MFr Bld: 7.8 % — ABNORMAL HIGH (ref <5.7)

## 2013-05-19 LAB — BLOOD GAS, ARTERIAL
Acid-Base Excess: 0.5 mmol/L (ref 0.0–2.0)
Bicarbonate: 24.4 mEq/L — ABNORMAL HIGH (ref 20.0–24.0)
O2 Saturation: 96.6 %
Patient temperature: 98.6
TCO2: 25.5 mmol/L (ref 0–100)

## 2013-05-19 LAB — URINE MICROSCOPIC-ADD ON

## 2013-05-19 LAB — PROTIME-INR
INR: 0.91 (ref 0.00–1.49)
Prothrombin Time: 12.1 seconds (ref 11.6–15.2)

## 2013-05-19 LAB — COMPREHENSIVE METABOLIC PANEL
BUN: 17 mg/dL (ref 6–23)
CO2: 20 mEq/L (ref 19–32)
Calcium: 9.3 mg/dL (ref 8.4–10.5)
Creatinine, Ser: 0.84 mg/dL (ref 0.50–1.10)
GFR calc Af Amer: 76 mL/min — ABNORMAL LOW (ref >90)
GFR calc non Af Amer: 66 mL/min — ABNORMAL LOW (ref >90)
Glucose, Bld: 337 mg/dL — ABNORMAL HIGH (ref 70–99)
Total Protein: 6.7 g/dL (ref 6.0–8.3)

## 2013-05-19 LAB — SURGICAL PCR SCREEN
MRSA, PCR: NEGATIVE
Staphylococcus aureus: NEGATIVE

## 2013-05-19 MED ORDER — ALBUTEROL SULFATE (5 MG/ML) 0.5% IN NEBU
2.5000 mg | INHALATION_SOLUTION | Freq: Once | RESPIRATORY_TRACT | Status: AC
  Administered 2013-05-19: 2.5 mg via RESPIRATORY_TRACT

## 2013-05-19 NOTE — Progress Notes (Signed)
Pt C/O SOB with exertion and denies chest pain at present but states that she had experienced some discomfort periodically over the last three weeks.Pt states that she was under the care of Dr. Sherlie Ban (cardiology) at  [facility].

## 2013-05-19 NOTE — Progress Notes (Signed)
VASCULAR LAB PRELIMINARY  PRELIMINARY  PRELIMINARY  PRELIMINARY  Pre-op Cardiac Surgery  Carotid Findings: Bilateral:  1-39% ICA stenosis.  Vertebral artery flow is antegrade.     Upper Extremity Right Left  Brachial Pressures 131Triphasic 129 Triphasic  Radial Waveforms Triphasic Triphasic  Ulnar Waveforms Triphasic Triphasic  Palmar Arch (Allen's Test) Normal Normal   Findings:  Doppler waveforms remained normal bilaterally with both radial and ulnar compressions    Lower  Extremity Right Left  Dorsalis Pedis 133 Monophasic 107 Dampened monophasic  Posterior Tibial 108 Monophasic 123 Triphasic  Ankle/Brachial Indices 1.02 0.94    Findings:  Resting ABIs indicate normal arterial flow bilaterally. Patient has known [name] and abnormal Doppler waveforms may suggest a false elevation in pressures with possible calcification   Sem Mccaughey, RVS 05/19/2013, 3:13 PM

## 2013-05-19 NOTE — Pre-Procedure Instructions (Signed)
Patricia Villegas  05/19/2013   Your procedure is scheduled on: Monday, May 24, 2013  Report to [facility] at 5:30 AM.  Call this number if you have problems the morning of surgery: [phone]   Remember:   Do not eat food or drink liquids after midnight.   Take these medicines the morning of surgery with A SIP OF WATER: carvedilol (COREG) 25 MG tablet, isosorbide mononitrate (IMDUR) 30 MG 24 hr tablet, predniSONE (DELTASONE) 5 MG tablet if needed: nitroGLYCERIN (NITROSTAT) 0.4 MG SL tablet For chest pain  Do not take any NSAIDs ie: Ibuprofen, Advil, Naproxen or any medication containing Aspirin.  Do not wear jewelry, make-up or nail polish.  Do not wear lotions, powders, or perfumes. You may NOT wear deodorant.  Do not shave 48 hours prior to surgery.  Do not bring valuables to the hospital.  [facility] is not responsible for any belongings or valuables.  Contacts, dentures or bridgework may not be worn into surgery.  Leave suitcase in the car. After surgery it may be brought to your room.  For patients admitted to the hospital, checkout time is 11:00 AM the day of discharge.   Patients discharged the day of surgery will not be allowed to drive home.  Name and phone number of your driver:   Special Instructions: Shower using CHG 2 nights before surgery and the night before surgery.  If you shower the day of surgery use CHG.  Use special wash - you have one bottle of CHG for all showers.  You should use approximately 1/3 of the bottle for each shower.   Please read over the following fact sheets that you were given: Pain Booklet, Coughing and Deep Breathing, Blood Transfusion Information, Open Heart Packet, MRSA Information and Surgical Site Infection Prevention Bring incentive spirometer and incentive spirometer instruction sheet along with the cardiac teaching booklet on the day of surgery

## 2013-05-21 ENCOUNTER — Encounter (HOSPITAL_COMMUNITY): Payer: Medicare Other

## 2013-05-23 MED ORDER — DOPAMINE-DEXTROSE 3.2-5 MG/ML-% IV SOLN
2.0000 ug/kg/min | INTRAVENOUS | Status: DC
  Filled 2013-05-23: qty 250

## 2013-05-23 MED ORDER — SODIUM CHLORIDE 0.9 % IV SOLN
INTRAVENOUS | Status: AC
  Administered 2013-05-24: 1 [IU]/h via INTRAVENOUS
  Filled 2013-05-23: qty 1

## 2013-05-23 MED ORDER — PLASMA-LYTE 148 IV SOLN
INTRAVENOUS | Status: AC
  Administered 2013-05-24: 08:00:00
  Filled 2013-05-23: qty 2.5

## 2013-05-23 MED ORDER — EPINEPHRINE HCL 1 MG/ML IJ SOLN
0.5000 ug/min | INTRAVENOUS | Status: DC
  Filled 2013-05-23: qty 4

## 2013-05-23 MED ORDER — DEXTROSE 5 % IV SOLN
30.0000 ug/min | INTRAVENOUS | Status: AC
  Administered 2013-05-24: 50 ug/min via INTRAVENOUS
  Filled 2013-05-23: qty 2

## 2013-05-23 MED ORDER — CEFUROXIME SODIUM 750 MG IJ SOLR
750.0000 mg | INTRAMUSCULAR | Status: DC
  Filled 2013-05-23: qty 750

## 2013-05-23 MED ORDER — DEXTROSE 5 % IV SOLN
1.5000 g | INTRAVENOUS | Status: AC
  Administered 2013-05-24: .75 g via INTRAVENOUS
  Administered 2013-05-24: 1.5 g via INTRAVENOUS
  Filled 2013-05-23: qty 1.5

## 2013-05-23 MED ORDER — NITROGLYCERIN IN D5W 200-5 MCG/ML-% IV SOLN
2.0000 ug/min | INTRAVENOUS | Status: AC
  Administered 2013-05-24: 16.7 ug/min via INTRAVENOUS
  Filled 2013-05-23: qty 250

## 2013-05-23 MED ORDER — SODIUM CHLORIDE 0.9 % IV SOLN
INTRAVENOUS | Status: DC
  Filled 2013-05-23: qty 30

## 2013-05-23 MED ORDER — MAGNESIUM SULFATE 50 % IJ SOLN
40.0000 meq | INTRAMUSCULAR | Status: DC
  Filled 2013-05-23: qty 10

## 2013-05-23 MED ORDER — DEXMEDETOMIDINE HCL IN NACL 400 MCG/100ML IV SOLN
0.1000 ug/kg/h | INTRAVENOUS | Status: AC
  Administered 2013-05-24: .2 ug/h via INTRAVENOUS
  Filled 2013-05-23: qty 100

## 2013-05-23 MED ORDER — SODIUM CHLORIDE 0.9 % IV SOLN
INTRAVENOUS | Status: DC
  Filled 2013-05-23: qty 40

## 2013-05-23 MED ORDER — VANCOMYCIN HCL 10 G IV SOLR
1250.0000 mg | INTRAVENOUS | Status: AC
  Administered 2013-05-24: 1250 mg via INTRAVENOUS
  Filled 2013-05-23: qty 1250

## 2013-05-23 MED ORDER — POTASSIUM CHLORIDE 2 MEQ/ML IV SOLN
80.0000 meq | INTRAVENOUS | Status: DC
  Filled 2013-05-23: qty 40

## 2013-05-24 ENCOUNTER — Encounter (HOSPITAL_COMMUNITY)
Admission: RE | Disposition: A | Discharge status: Home / Self Care | Payer: Medicare Other | Source: Ambulatory Visit | Attending: Thoracic Surgery (Cardiothoracic Vascular Surgery)

## 2013-05-24 ENCOUNTER — Inpatient Hospital Stay (HOSPITAL_COMMUNITY)
Admission: RE | Admit: 2013-05-24 | Discharge: 2013-05-29 | DRG: 236 | Disposition: A | Discharge status: Home / Self Care | Payer: Medicare Other | Source: Ambulatory Visit | Attending: Thoracic Surgery (Cardiothoracic Vascular Surgery) | Admitting: Thoracic Surgery (Cardiothoracic Vascular Surgery)

## 2013-05-24 ENCOUNTER — Encounter (HOSPITAL_COMMUNITY): Payer: Self-pay | Admitting: Vascular Surgery

## 2013-05-24 ENCOUNTER — Encounter (HOSPITAL_COMMUNITY): Payer: Self-pay

## 2013-05-24 ENCOUNTER — Inpatient Hospital Stay (HOSPITAL_COMMUNITY): Payer: Medicare Other | Admitting: Anesthesiology

## 2013-05-24 ENCOUNTER — Inpatient Hospital Stay (HOSPITAL_COMMUNITY): Payer: Medicare Other

## 2013-05-24 DIAGNOSIS — E119 Type 2 diabetes mellitus without complications: Secondary | ICD-10-CM | POA: Diagnosis present

## 2013-05-24 DIAGNOSIS — E8779 Other fluid overload: Secondary | ICD-10-CM | POA: Diagnosis not present

## 2013-05-24 DIAGNOSIS — Z87891 Personal history of nicotine dependence: Secondary | ICD-10-CM

## 2013-05-24 DIAGNOSIS — I25810 Atherosclerosis of coronary artery bypass graft(s) without angina pectoris: Secondary | ICD-10-CM

## 2013-05-24 DIAGNOSIS — I10 Essential (primary) hypertension: Secondary | ICD-10-CM | POA: Diagnosis present

## 2013-05-24 DIAGNOSIS — J9819 Other pulmonary collapse: Secondary | ICD-10-CM | POA: Diagnosis not present

## 2013-05-24 DIAGNOSIS — I739 Peripheral vascular disease, unspecified: Secondary | ICD-10-CM | POA: Diagnosis present

## 2013-05-24 DIAGNOSIS — I2510 Atherosclerotic heart disease of native coronary artery without angina pectoris: Principal | ICD-10-CM | POA: Diagnosis present

## 2013-05-24 DIAGNOSIS — E785 Hyperlipidemia, unspecified: Secondary | ICD-10-CM | POA: Diagnosis present

## 2013-05-24 DIAGNOSIS — E7800 Pure hypercholesterolemia, unspecified: Secondary | ICD-10-CM | POA: Diagnosis present

## 2013-05-24 DIAGNOSIS — Z79899 Other long term (current) drug therapy: Secondary | ICD-10-CM

## 2013-05-24 DIAGNOSIS — M069 Rheumatoid arthritis, unspecified: Secondary | ICD-10-CM | POA: Diagnosis present

## 2013-05-24 DIAGNOSIS — D62 Acute posthemorrhagic anemia: Secondary | ICD-10-CM | POA: Diagnosis not present

## 2013-05-24 DIAGNOSIS — I059 Rheumatic mitral valve disease, unspecified: Secondary | ICD-10-CM | POA: Diagnosis present

## 2013-05-24 DIAGNOSIS — D696 Thrombocytopenia, unspecified: Secondary | ICD-10-CM | POA: Diagnosis not present

## 2013-05-24 HISTORY — PX: CORONARY ARTERY BYPASS GRAFT: SHX141

## 2013-05-24 LAB — CARBOXYHEMOGLOBIN
Carboxyhemoglobin: 1.2 % (ref 0.5–1.5)
Methemoglobin: 0.6 % (ref 0.0–1.5)
O2 Saturation: 50.1 %
Total hemoglobin: 11.1 g/dL — ABNORMAL LOW (ref 12.0–16.0)

## 2013-05-24 LAB — POCT I-STAT 4, (NA,K, GLUC, HGB,HCT)
Glucose, Bld: 117 mg/dL — ABNORMAL HIGH (ref 70–99)
Glucose, Bld: 94 mg/dL (ref 70–99)
Glucose, Bld: 116 mg/dL — ABNORMAL HIGH (ref 70–99)
Glucose, Bld: 139 mg/dL — ABNORMAL HIGH (ref 70–99)
Glucose, Bld: 123 mg/dL — ABNORMAL HIGH (ref 70–99)
HCT: 29 % — ABNORMAL LOW (ref 36.0–46.0)
HCT: 23 % — ABNORMAL LOW (ref 36.0–46.0)
HCT: 21 % — ABNORMAL LOW (ref 36.0–46.0)
Hemoglobin: 7.8 g/dL — ABNORMAL LOW (ref 12.0–15.0)
Hemoglobin: 7.1 g/dL — ABNORMAL LOW (ref 12.0–15.0)
Potassium: 2.9 mEq/L — ABNORMAL LOW (ref 3.5–5.1)
Potassium: 3 mEq/L — ABNORMAL LOW (ref 3.5–5.1)
Potassium: 3.8 mEq/L (ref 3.5–5.1)
Potassium: 3.4 mEq/L — ABNORMAL LOW (ref 3.5–5.1)
Sodium: 138 mEq/L (ref 135–145)
Sodium: 143 mEq/L (ref 135–145)
Sodium: 141 mEq/L (ref 135–145)
Sodium: 142 mEq/L (ref 135–145)
Sodium: 141 mEq/L (ref 135–145)

## 2013-05-24 LAB — POCT I-STAT, CHEM 8
BUN: 13 mg/dL (ref 6–23)
Creatinine, Ser: 0.9 mg/dL (ref 0.50–1.10)
Hemoglobin: 8.2 g/dL — ABNORMAL LOW (ref 12.0–15.0)
Potassium: 4.5 mEq/L (ref 3.5–5.1)
Sodium: 141 mEq/L (ref 135–145)

## 2013-05-24 LAB — CBC
HCT: 26.6 % — ABNORMAL LOW (ref 36.0–46.0)
HCT: 26.6 % — ABNORMAL LOW (ref 36.0–46.0)
Hemoglobin: 9 g/dL — ABNORMAL LOW (ref 12.0–15.0)
Hemoglobin: 9 g/dL — ABNORMAL LOW (ref 12.0–15.0)
MCHC: 33.8 g/dL (ref 30.0–36.0)
MCV: 92.4 fL (ref 78.0–100.0)
WBC: 12.3 10*3/uL — ABNORMAL HIGH (ref 4.0–10.5)
WBC: 13.1 10*3/uL — ABNORMAL HIGH (ref 4.0–10.5)

## 2013-05-24 LAB — POCT I-STAT 3, ART BLOOD GAS (G3+)
Acid-base deficit: 2 mmol/L (ref 0.0–2.0)
Bicarbonate: 25.1 mEq/L — ABNORMAL HIGH (ref 20.0–24.0)
Bicarbonate: 22.9 mEq/L (ref 20.0–24.0)
O2 Saturation: 96 %
TCO2: 26 mmol/L (ref 0–100)
TCO2: 24 mmol/L (ref 0–100)
pCO2 arterial: 43.5 mmHg (ref 35.0–45.0)
pCO2 arterial: 43.8 mmHg (ref 35.0–45.0)
pCO2 arterial: 44.1 mmHg (ref 35.0–45.0)
pH, Arterial: 7.33 — ABNORMAL LOW (ref 7.350–7.450)
pO2, Arterial: 88 mmHg (ref 80.0–100.0)
pO2, Arterial: 95 mmHg (ref 80.0–100.0)

## 2013-05-24 LAB — GLUCOSE, CAPILLARY
Glucose-Capillary: 110 mg/dL — ABNORMAL HIGH (ref 70–99)
Glucose-Capillary: 144 mg/dL — ABNORMAL HIGH (ref 70–99)
Glucose-Capillary: 108 mg/dL — ABNORMAL HIGH (ref 70–99)

## 2013-05-24 LAB — CREATININE, SERUM: GFR calc Af Amer: 75 mL/min — ABNORMAL LOW (ref >90)

## 2013-05-24 LAB — PREPARE RBC (CROSSMATCH)

## 2013-05-24 LAB — PROTIME-INR
INR: 1.41 (ref 0.00–1.49)
Prothrombin Time: 16.9 seconds — ABNORMAL HIGH (ref 11.6–15.2)

## 2013-05-24 LAB — APTT: aPTT: 30 seconds (ref 24–37)

## 2013-05-24 LAB — HEMOGLOBIN AND HEMATOCRIT, BLOOD: HCT: 23.8 % — ABNORMAL LOW (ref 36.0–46.0)

## 2013-05-24 SURGERY — CORONARY ARTERY BYPASS GRAFTING (CABG)
Anesthesia: General | Site: Chest | Wound class: Clean

## 2013-05-24 MED ORDER — PANTOPRAZOLE SODIUM 40 MG PO TBEC
40.0000 mg | DELAYED_RELEASE_TABLET | Freq: Every day | ORAL | Status: DC
  Administered 2013-05-26 – 2013-05-29 (×3): 40 mg via ORAL
  Filled 2013-05-24 (×3): qty 1

## 2013-05-24 MED ORDER — NITROGLYCERIN 0.2 MG/ML ON CALL CATH LAB: INTRAVENOUS | Status: DC | PRN

## 2013-05-24 MED ORDER — PROPOFOL 10 MG/ML IV BOLUS
INTRAVENOUS | Status: DC | PRN
  Administered 2013-05-24: 40 mg via INTRAVENOUS

## 2013-05-24 MED ORDER — ACETAMINOPHEN 650 MG RE SUPP
650.0000 mg | Freq: Once | RECTAL | Status: AC
  Administered 2013-05-24: 650 mg via RECTAL

## 2013-05-24 MED ORDER — HEPARIN SODIUM (PORCINE) 1000 UNIT/ML IJ SOLN
INTRAMUSCULAR | Status: DC | PRN
  Administered 2013-05-24: 2000 [IU] via INTRAVENOUS
  Administered 2013-05-24: 5000 [IU] via INTRAVENOUS
  Administered 2013-05-24: 24000 [IU] via INTRAVENOUS

## 2013-05-24 MED ORDER — LACTATED RINGERS IV SOLN
INTRAVENOUS | Status: DC | PRN
  Administered 2013-05-24: 07:00:00 via INTRAVENOUS

## 2013-05-24 MED ORDER — MIDAZOLAM HCL 2 MG/2ML IJ SOLN: 2.0000 mg | INTRAMUSCULAR | Status: DC | PRN

## 2013-05-24 MED ORDER — ROCURONIUM BROMIDE 100 MG/10ML IV SOLN
INTRAVENOUS | Status: DC | PRN
  Administered 2013-05-24: 70 mg via INTRAVENOUS
  Administered 2013-05-24: 30 mg via INTRAVENOUS

## 2013-05-24 MED ORDER — HYDROCORTISONE SOD SUCCINATE 100 MG IJ SOLR
100.0000 mg | Freq: Three times a day (TID) | INTRAMUSCULAR | Status: AC
  Administered 2013-05-24 – 2013-05-25 (×3): 100 mg via INTRAVENOUS
  Filled 2013-05-24 (×6): qty 2

## 2013-05-24 MED ORDER — VECURONIUM BROMIDE 10 MG IV SOLR
INTRAVENOUS | Status: DC | PRN
  Administered 2013-05-24: 5 mg via INTRAVENOUS

## 2013-05-24 MED ORDER — ONDANSETRON HCL 4 MG/2ML IJ SOLN: 4.0000 mg | Freq: Four times a day (QID) | INTRAMUSCULAR | Status: DC | PRN

## 2013-05-24 MED ORDER — SODIUM CHLORIDE 0.9 % IJ SOLN
3.0000 mL | Freq: Two times a day (BID) | INTRAMUSCULAR | Status: DC
  Administered 2013-05-25 – 2013-05-26 (×3): 3 mL via INTRAVENOUS

## 2013-05-24 MED ORDER — AMINOCAPROIC ACID 250 MG/ML IV SOLN
INTRAVENOUS | Status: DC | PRN
  Administered 2013-05-24 (×4): 1 g via INTRAVENOUS
  Administered 2013-05-24: 5 g via INTRAVENOUS
  Administered 2013-05-24: 1 g via INTRAVENOUS

## 2013-05-24 MED ORDER — HYDROCORTISONE SOD SUCCINATE 100 MG IJ SOLR
50.0000 mg | Freq: Three times a day (TID) | INTRAMUSCULAR | Status: AC
  Administered 2013-05-25 – 2013-05-26 (×3): 50 mg via INTRAVENOUS
  Filled 2013-05-24 (×3): qty 1

## 2013-05-24 MED ORDER — MORPHINE SULFATE 2 MG/ML IJ SOLN: 1.0000 mg | INTRAMUSCULAR | Status: AC | PRN

## 2013-05-24 MED ORDER — ATORVASTATIN CALCIUM 40 MG PO TABS
40.0000 mg | ORAL_TABLET | Freq: Every day | ORAL | Status: DC
  Administered 2013-05-25 – 2013-05-28 (×4): 40 mg via ORAL
  Filled 2013-05-24 (×5): qty 1

## 2013-05-24 MED ORDER — DOCUSATE SODIUM 100 MG PO CAPS
200.0000 mg | ORAL_CAPSULE | Freq: Every day | ORAL | Status: DC
  Administered 2013-05-25 – 2013-05-29 (×5): 200 mg via ORAL
  Filled 2013-05-24 (×5): qty 2

## 2013-05-24 MED ORDER — LACTATED RINGERS IV SOLN: 500.0000 mL | Freq: Once | INTRAVENOUS | Status: AC | PRN

## 2013-05-24 MED ORDER — LACTATED RINGERS IV SOLN
INTRAVENOUS | Status: DC
  Administered 2013-05-25: 02:00:00 via INTRAVENOUS

## 2013-05-24 MED ORDER — ALBUMIN HUMAN 5 % IV SOLN
250.0000 mL | INTRAVENOUS | Status: AC | PRN
  Administered 2013-05-24 (×3): 250 mL via INTRAVENOUS
  Filled 2013-05-24: qty 250

## 2013-05-24 MED ORDER — SODIUM CHLORIDE 0.9 % IV SOLN
INTRAVENOUS | Status: DC
  Administered 2013-05-24: 1.4 [IU]/h via INTRAVENOUS
  Filled 2013-05-24 (×2): qty 1

## 2013-05-24 MED ORDER — SODIUM CHLORIDE 0.45 % IV SOLN
INTRAVENOUS | Status: DC
  Administered 2013-05-25: 21:00:00 via INTRAVENOUS

## 2013-05-24 MED ORDER — ASPIRIN 81 MG PO CHEW: 324.0000 mg | CHEWABLE_TABLET | Freq: Every day | ORAL | Status: DC

## 2013-05-24 MED ORDER — ARTIFICIAL TEARS OP OINT
TOPICAL_OINTMENT | OPHTHALMIC | Status: DC | PRN
  Administered 2013-05-24: 1 via OPHTHALMIC

## 2013-05-24 MED ORDER — PHENYLEPHRINE HCL 10 MG/ML IJ SOLN
0.0000 ug/min | INTRAVENOUS | Status: DC
  Administered 2013-05-24: 20 ug/min via INTRAVENOUS
  Filled 2013-05-24 (×2): qty 2

## 2013-05-24 MED ORDER — CALCIUM CHLORIDE 10 % IV SOLN
1.0000 g | Freq: Once | INTRAVENOUS | Status: AC
  Administered 2013-05-24: 1 g via INTRAVENOUS

## 2013-05-24 MED ORDER — DEXMEDETOMIDINE HCL IN NACL 200 MCG/50ML IV SOLN
0.1000 ug/kg/h | INTRAVENOUS | Status: DC
  Administered 2013-05-24: 0.5 ug/kg/h via INTRAVENOUS
  Filled 2013-05-24: qty 50

## 2013-05-24 MED ORDER — HYDROCORTISONE SOD SUCCINATE 100 MG IJ SOLR
50.0000 mg | Freq: Two times a day (BID) | INTRAMUSCULAR | Status: DC
  Filled 2013-05-24 (×2): qty 1

## 2013-05-24 MED ORDER — PROTAMINE SULFATE 10 MG/ML IV SOLN
INTRAVENOUS | Status: DC | PRN
  Administered 2013-05-24 (×2): 50 mg via INTRAVENOUS
  Administered 2013-05-24: 10 mg via INTRAVENOUS
  Administered 2013-05-24: 100 mg via INTRAVENOUS
  Administered 2013-05-24: 90 mg via INTRAVENOUS

## 2013-05-24 MED ORDER — METOPROLOL TARTRATE 12.5 MG HALF TABLET: 12.5000 mg | ORAL_TABLET | Freq: Once | ORAL | Status: DC

## 2013-05-24 MED ORDER — BISACODYL 10 MG RE SUPP: 10.0000 mg | Freq: Every day | RECTAL | Status: DC

## 2013-05-24 MED ORDER — 0.9 % SODIUM CHLORIDE (POUR BTL) OPTIME
TOPICAL | Status: DC | PRN
  Administered 2013-05-24: 6000 mL

## 2013-05-24 MED ORDER — SODIUM CHLORIDE 0.9 % IV SOLN: 250.0000 mL | INTRAVENOUS | Status: DC

## 2013-05-24 MED ORDER — SODIUM CHLORIDE 0.9 % IV SOLN
INTRAVENOUS | Status: DC | PRN
  Administered 2013-05-24 (×3): via INTRAVENOUS

## 2013-05-24 MED ORDER — BISACODYL 5 MG PO TBEC
10.0000 mg | DELAYED_RELEASE_TABLET | Freq: Every day | ORAL | Status: DC
  Administered 2013-05-25 – 2013-05-29 (×2): 10 mg via ORAL
  Filled 2013-05-24 (×2): qty 2

## 2013-05-24 MED ORDER — ASPIRIN EC 325 MG PO TBEC
325.0000 mg | DELAYED_RELEASE_TABLET | Freq: Every day | ORAL | Status: DC
  Administered 2013-05-25 – 2013-05-29 (×5): 325 mg via ORAL
  Filled 2013-05-24 (×5): qty 1

## 2013-05-24 MED ORDER — MICROFIBRILLAR COLL HEMOSTAT EX PADS
MEDICATED_PAD | CUTANEOUS | Status: DC | PRN
  Administered 2013-05-24: 1 via TOPICAL

## 2013-05-24 MED ORDER — NITROGLYCERIN IN D5W 200-5 MCG/ML-% IV SOLN: 0.0000 ug/min | INTRAVENOUS | Status: DC

## 2013-05-24 MED ORDER — MIDAZOLAM HCL 5 MG/5ML IJ SOLN
INTRAMUSCULAR | Status: DC | PRN
  Administered 2013-05-24: 3 mg via INTRAVENOUS
  Administered 2013-05-24 (×3): 2 mg via INTRAVENOUS
  Administered 2013-05-24: 1 mg via INTRAVENOUS

## 2013-05-24 MED ORDER — SODIUM CHLORIDE 0.9 % IV SOLN: INTRAVENOUS | Status: DC

## 2013-05-24 MED ORDER — MAGNESIUM SULFATE 40 MG/ML IJ SOLN
4.0000 g | Freq: Once | INTRAMUSCULAR | Status: AC
  Administered 2013-05-24: 4 g via INTRAVENOUS
  Filled 2013-05-24: qty 100

## 2013-05-24 MED ORDER — LIDOCAINE HCL (CARDIAC) 20 MG/ML IV SOLN
INTRAVENOUS | Status: DC | PRN
  Administered 2013-05-24: 60 mg via INTRAVENOUS

## 2013-05-24 MED ORDER — METOPROLOL TARTRATE 25 MG/10 ML ORAL SUSPENSION
12.5000 mg | Freq: Two times a day (BID) | ORAL | Status: DC
  Filled 2013-05-24 (×5): qty 5

## 2013-05-24 MED ORDER — FENTANYL CITRATE 0.05 MG/ML IJ SOLN
INTRAMUSCULAR | Status: DC | PRN
  Administered 2013-05-24: 250 ug via INTRAVENOUS
  Administered 2013-05-24: 550 ug via INTRAVENOUS
  Administered 2013-05-24: 50 ug via INTRAVENOUS
  Administered 2013-05-24: 250 ug via INTRAVENOUS
  Administered 2013-05-24: 150 ug via INTRAVENOUS

## 2013-05-24 MED ORDER — ACETAMINOPHEN 500 MG PO TABS
1000.0000 mg | ORAL_TABLET | Freq: Four times a day (QID) | ORAL | Status: DC
  Administered 2013-05-25 – 2013-05-28 (×9): 1000 mg via ORAL
  Filled 2013-05-24 (×20): qty 2

## 2013-05-24 MED ORDER — SODIUM CHLORIDE 0.9 % IJ SOLN: 3.0000 mL | INTRAMUSCULAR | Status: DC | PRN

## 2013-05-24 MED ORDER — OXYCODONE HCL 5 MG PO TABS: 5.0000 mg | ORAL_TABLET | ORAL | Status: DC | PRN

## 2013-05-24 MED ORDER — SODIUM CHLORIDE 0.9 % IJ SOLN
OROMUCOSAL | Status: DC | PRN
  Administered 2013-05-24 (×3): via TOPICAL

## 2013-05-24 MED ORDER — ACETAMINOPHEN 160 MG/5ML PO SOLN
1000.0000 mg | Freq: Four times a day (QID) | ORAL | Status: DC
  Administered 2013-05-24: 1000 mg
  Filled 2013-05-24: qty 40.6

## 2013-05-24 MED ORDER — MORPHINE SULFATE 2 MG/ML IJ SOLN
2.0000 mg | INTRAMUSCULAR | Status: DC | PRN
  Administered 2013-05-24 – 2013-05-25 (×5): 2 mg via INTRAVENOUS
  Filled 2013-05-24 (×5): qty 1

## 2013-05-24 MED ORDER — METOPROLOL TARTRATE 1 MG/ML IV SOLN: 2.5000 mg | INTRAVENOUS | Status: DC | PRN

## 2013-05-24 MED ORDER — INSULIN REGULAR BOLUS VIA INFUSION
0.0000 [IU] | Freq: Three times a day (TID) | INTRAVENOUS | Status: DC
  Administered 2013-05-25: 4 [IU] via INTRAVENOUS
  Administered 2013-05-25: 2 [IU] via INTRAVENOUS
  Filled 2013-05-24: qty 10

## 2013-05-24 MED ORDER — POTASSIUM CHLORIDE 10 MEQ/50ML IV SOLN
10.0000 meq | INTRAVENOUS | Status: AC
  Administered 2013-05-24 (×3): 10 meq via INTRAVENOUS

## 2013-05-24 MED ORDER — POTASSIUM CHLORIDE 10 MEQ/50ML IV SOLN
10.0000 meq | Freq: Once | INTRAVENOUS | Status: AC
  Administered 2013-05-24: 10 meq via INTRAVENOUS

## 2013-05-24 MED ORDER — FAMOTIDINE IN NACL 20-0.9 MG/50ML-% IV SOLN
20.0000 mg | Freq: Two times a day (BID) | INTRAVENOUS | Status: AC
  Administered 2013-05-24: 20 mg via INTRAVENOUS

## 2013-05-24 MED ORDER — CHLORHEXIDINE GLUCONATE 4 % EX LIQD: 30.0000 mL | CUTANEOUS | Status: DC

## 2013-05-24 MED ORDER — ACETAMINOPHEN 160 MG/5ML PO SOLN: 650.0000 mg | Freq: Once | ORAL | Status: AC

## 2013-05-24 MED ORDER — DEXTROSE 5 % IV SOLN
1.5000 g | Freq: Two times a day (BID) | INTRAVENOUS | Status: AC
  Administered 2013-05-24 – 2013-05-26 (×4): 1.5 g via INTRAVENOUS
  Filled 2013-05-24 (×4): qty 1.5

## 2013-05-24 MED ORDER — METOPROLOL TARTRATE 12.5 MG HALF TABLET
12.5000 mg | ORAL_TABLET | Freq: Two times a day (BID) | ORAL | Status: DC
  Administered 2013-05-25 (×2): 12.5 mg via ORAL
  Filled 2013-05-24 (×5): qty 1

## 2013-05-24 MED ORDER — VANCOMYCIN HCL IN DEXTROSE 1-5 GM/200ML-% IV SOLN
1000.0000 mg | Freq: Once | INTRAVENOUS | Status: AC
  Administered 2013-05-24: 1000 mg via INTRAVENOUS
  Filled 2013-05-24: qty 200

## 2013-05-24 MED ORDER — PREDNISONE 5 MG PO TABS: 5.0000 mg | ORAL_TABLET | Freq: Every day | ORAL | Status: DC

## 2013-05-24 SURGICAL SUPPLY — 89 items
ATTRACTOMAT 16X20 MAGNETIC DRP (DRAPES) ×2 IMPLANT
BAG DECANTER FOR FLEXI CONT (MISCELLANEOUS) ×2 IMPLANT
BANDAGE ELASTIC 4 VELCRO ST LF (GAUZE/BANDAGES/DRESSINGS) ×4 IMPLANT
BANDAGE ELASTIC 6 VELCRO ST LF (GAUZE/BANDAGES/DRESSINGS) ×4 IMPLANT
BANDAGE GAUZE 4  KLING STR (GAUZE/BANDAGES/DRESSINGS) ×4 IMPLANT
BANDAGE GAUZE ELAST BULKY 4 IN (GAUZE/BANDAGES/DRESSINGS) ×2 IMPLANT
BASKET HEART (ORDER IN 25'S) (MISCELLANEOUS) ×1
BASKET HEART (ORDER IN 25S) (MISCELLANEOUS) ×1 IMPLANT
BLADE STERNUM SYSTEM 6 (BLADE) ×2 IMPLANT
CANISTER SUCTION 2500CC (MISCELLANEOUS) ×2 IMPLANT
CANNULA EZ GLIDE AORTIC 21FR (CANNULA) ×4 IMPLANT
CANNULA VENOUS LOW PROF 34X46 (CANNULA) IMPLANT
CATH CPB KIT HENDRICKSON (MISCELLANEOUS) ×2 IMPLANT
CATH MALECOT BARD  28FR (CATHETERS) ×2 IMPLANT
CATH ROBINSON RED A/P 18FR (CATHETERS) ×2 IMPLANT
CATH THORACIC 36FR (CATHETERS) ×2 IMPLANT
CATH THORACIC 36FR RT ANG (CATHETERS) ×2 IMPLANT
CLIP RETRACTION 3.0MM CORONARY (MISCELLANEOUS) ×2 IMPLANT
CLIP TI MEDIUM 24 (CLIP) IMPLANT
CLIP TI WIDE RED SMALL 24 (CLIP) ×4 IMPLANT
COVER SURGICAL LIGHT HANDLE (MISCELLANEOUS) ×2 IMPLANT
CRADLE DONUT ADULT HEAD (MISCELLANEOUS) ×2 IMPLANT
DRAPE CARDIOVASCULAR INCISE (DRAPES) ×1
DRAPE SLUSH/WARMER DISC (DRAPES) ×2 IMPLANT
DRAPE SRG 135X102X78XABS (DRAPES) ×1 IMPLANT
DRSG COVADERM 4X14 (GAUZE/BANDAGES/DRESSINGS) ×2 IMPLANT
ELECT REM PT RETURN 9FT ADLT (ELECTROSURGICAL) ×4
ELECTRODE REM PT RTRN 9FT ADLT (ELECTROSURGICAL) ×2 IMPLANT
GLOVE BIO SURGEON STRL SZ 6 (GLOVE) ×4 IMPLANT
GLOVE BIO SURGEON STRL SZ 6.5 (GLOVE) ×4 IMPLANT
GLOVE BIOGEL M 6.5 STRL (GLOVE) ×4 IMPLANT
GLOVE EUDERMIC 7 POWDERFREE (GLOVE) ×8 IMPLANT
GOWN PREVENTION PLUS XLARGE (GOWN DISPOSABLE) ×4 IMPLANT
GOWN STRL NON-REIN LRG LVL3 (GOWN DISPOSABLE) ×8 IMPLANT
HEMOSTAT POWDER SURGIFOAM 1G (HEMOSTASIS) ×6 IMPLANT
HEMOSTAT SURGICEL 2X14 (HEMOSTASIS) ×2 IMPLANT
INSERT FOGARTY XLG (MISCELLANEOUS) IMPLANT
KIT BASIN OR (CUSTOM PROCEDURE TRAY) ×2 IMPLANT
KIT ROOM TURNOVER OR (KITS) ×2 IMPLANT
KIT SUCTION CATH 14FR (SUCTIONS) ×4 IMPLANT
KIT VASOVIEW W/TROCAR VH 2000 (KITS) ×2 IMPLANT
MARKER GRAFT CORONARY BYPASS (MISCELLANEOUS) ×6 IMPLANT
NS IRRIG 1000ML POUR BTL (IV SOLUTION) ×10 IMPLANT
PACK OPEN HEART (CUSTOM PROCEDURE TRAY) ×2 IMPLANT
PAD ARMBOARD 7.5X6 YLW CONV (MISCELLANEOUS) ×4 IMPLANT
PAD ELECT DEFIB RADIOL ZOLL (MISCELLANEOUS) ×2 IMPLANT
PATCH TACHOSII LRG 9.5X4.8 (VASCULAR PRODUCTS) ×2 IMPLANT
PENCIL BUTTON HOLSTER BLD 10FT (ELECTRODE) ×2 IMPLANT
PUNCH AORTIC ROTATE  4.5MM 8IN (MISCELLANEOUS) ×2 IMPLANT
PUNCH AORTIC ROTATE 4.0MM (MISCELLANEOUS) IMPLANT
PUNCH AORTIC ROTATE 4.5MM 8IN (MISCELLANEOUS) IMPLANT
PUNCH AORTIC ROTATE 5MM 8IN (MISCELLANEOUS) IMPLANT
SET CARDIOPLEGIA MPS 5001102 (MISCELLANEOUS) ×2 IMPLANT
SPONGE GAUZE 4X4 12PLY (GAUZE/BANDAGES/DRESSINGS) ×6 IMPLANT
SUT BONE WAX W31G (SUTURE) ×2 IMPLANT
SUT MNCRL AB 4-0 PS2 18 (SUTURE) IMPLANT
SUT PROLENE 3 0 SH DA (SUTURE) ×2 IMPLANT
SUT PROLENE 4 0 RB 1 (SUTURE)
SUT PROLENE 4 0 SH DA (SUTURE) IMPLANT
SUT PROLENE 4-0 RB1 .5 CRCL 36 (SUTURE) IMPLANT
SUT PROLENE 6 0 C 1 30 (SUTURE) ×4 IMPLANT
SUT PROLENE 6 0 CC (SUTURE) ×4 IMPLANT
SUT PROLENE 7 0 BV1 MDA (SUTURE) ×4 IMPLANT
SUT PROLENE 8 0 BV175 6 (SUTURE) IMPLANT
SUT SILK  1 MH (SUTURE) ×1
SUT SILK 1 MH (SUTURE) ×1 IMPLANT
SUT STEEL 6MS V (SUTURE) ×2 IMPLANT
SUT STEEL STERNAL CCS#1 18IN (SUTURE) IMPLANT
SUT STEEL SZ 6 DBL 3X14 BALL (SUTURE) ×2 IMPLANT
SUT VIC AB 1 CTX 36 (SUTURE) ×2
SUT VIC AB 1 CTX36XBRD ANBCTR (SUTURE) ×2 IMPLANT
SUT VIC AB 2-0 CT1 27 (SUTURE) ×1
SUT VIC AB 2-0 CT1 TAPERPNT 27 (SUTURE) ×1 IMPLANT
SUT VIC AB 2-0 CTX 27 (SUTURE) IMPLANT
SUT VIC AB 3-0 SH 27 (SUTURE)
SUT VIC AB 3-0 SH 27X BRD (SUTURE) IMPLANT
SUT VIC AB 3-0 X1 27 (SUTURE) ×2 IMPLANT
SUT VICRYL 4-0 PS2 18IN ABS (SUTURE) IMPLANT
SUTURE E-PAK OPEN HEART (SUTURE) ×2 IMPLANT
SYSTEM SAHARA CHEST DRAIN ATS (WOUND CARE) ×2 IMPLANT
TAPE CLOTH SURG 4X10 WHT LF (GAUZE/BANDAGES/DRESSINGS) ×2 IMPLANT
TOWEL OR 17X24 6PK STRL BLUE (TOWEL DISPOSABLE) ×4 IMPLANT
TOWEL OR 17X26 10 PK STRL BLUE (TOWEL DISPOSABLE) ×4 IMPLANT
TRAY FOLEY IC TEMP SENS 14FR (CATHETERS) ×2 IMPLANT
TUBE FEEDING 8FR 16IN STR KANG (MISCELLANEOUS) ×2 IMPLANT
TUBE SUCT INTRACARD DLP 20F (MISCELLANEOUS) ×2 IMPLANT
TUBING INSUFFLATION 10FT LAP (TUBING) ×2 IMPLANT
UNDERPAD 30X30 INCONTINENT (UNDERPADS AND DIAPERS) ×2 IMPLANT
WATER STERILE IRR 1000ML POUR (IV SOLUTION) ×4 IMPLANT

## 2013-05-24 NOTE — Anesthesia Procedure Notes (Signed)
Procedure Name: Intubation Date/Time: 05/24/2013 7:45 AM Performed by: Tyrone Nine Pre-anesthesia Checklist: Patient identified, Timeout performed, Emergency Drugs available, Suction available and Patient being monitored Patient Re-evaluated:Patient Re-evaluated prior to inductionOxygen Delivery Method: Circle system utilized Preoxygenation: Pre-oxygenation with 100% oxygen Intubation Type: IV induction Ventilation: Mask ventilation without difficulty Laryngoscope Size: Mac and 3 Grade View: Grade I Tube type: Oral Tube size: 8.0 mm Number of attempts: 1

## 2013-05-24 NOTE — Procedures (Signed)
Extubation Procedure Note  Patient Details:   Name: Patricia Villegas DOB: 04-Nov-1934 MRN: [id number]   Airway Documentation:     Evaluation  O2 sats: stable throughout Complications: No apparent complications Patient did tolerate procedure well. Bilateral Breath Sounds: Clear;Diminished   Yes Nif -2- [id number] Positive cuff leak  Newt Lukes 05/24/2013, 7:11 PM

## 2013-05-24 NOTE — Progress Notes (Signed)
Verified no vein harvest from Right leg. And ok for SCD to be applied over compression drsg. To Right lower extremity. Done and will continue to monitor closely. Patricia Villegas

## 2013-05-24 NOTE — Interval H&P Note (Signed)
For CABG. Aware of indications, risks, benefits and alternatives. All questions answered

## 2013-05-24 NOTE — Transfer of Care (Signed)
Immediate Anesthesia Transfer of Care Note  Patient: Patricia Villegas  Procedure(s) Performed: Procedure(s): CORONARY ARTERY BYPASS GRAFTING (CABG) (N/A)  Patient Location: PACU and SICU  Anesthesia Type:General  Level of Consciousness: unresponsive  Airway & Oxygen Therapy: Patient remains intubated per anesthesia plan and Patient placed on Ventilator (see vital sign flow sheet for setting)  Post-op Assessment: Report given to PACU RN and Post -op Vital signs reviewed and stable  Post vital signs: Reviewed and stable  Complications: No apparent anesthesia complications

## 2013-05-24 NOTE — Preoperative (Signed)
Beta Blockers   Coreg 25 mgs taken 04:30 05-24-13

## 2013-05-24 NOTE — Progress Notes (Signed)
Spoke with Dr. Dorris Fetch via telephone. Updated on pt. Status. Made aware that after third run of albumin and calcium chloride pt. CI remains approx. 1.4 via monitor. All other invasive hemodynamics stable at this time. Dr. Dorris Fetch to bedside. Swann repositioned, wedge pressures done by MD. States to disregard CI at this time as long as other invasives stable. Precedex weaning continued. Gildardo Pounds

## 2013-05-24 NOTE — Anesthesia Postprocedure Evaluation (Signed)
Anesthesia Post Note  Patient: Patricia Villegas  Procedure(s) Performed: Procedure(s) (LRB): CORONARY ARTERY BYPASS GRAFTING (CABG) (N/A)  Anesthesia type: General  Patient location: ICU  Post pain: Pain level controlled  Post assessment: Post-op Vital signs reviewed  Last Vitals:  Filed Vitals:   05/24/13 1400  BP: 100/65  Pulse: 90  Temp: 35.6 C  Resp:     Post vital signs: stable  Level of consciousness: Patient remains intubated per anesthesia plan  Complications: No apparent anesthesia complications

## 2013-05-24 NOTE — Brief Op Note (Addendum)
      [address]       [place] [address]             [phone]     05/24/2013  11:47 AM  PATIENT:  Patricia Villegas  78 y.o. female  PRE-OPERATIVE DIAGNOSIS:  cad  POST-OPERATIVE DIAGNOSIS:  cad  PROCEDURE:  Procedure(s): CORONARY ARTERY BYPASS GRAFTING (CABG)X4 LIMA-LAD; SEQ SVG-OM1-PL; SVG-AM EVH LEFT LEG  SURGEON:  Surgeon(s): Loreli Slot, MD  PHYSICIAN ASSISTANT: WAYNE GOLD PA-C  ANESTHESIA:   general  PATIENT CONDITION:  ICU - intubated and hemodynamically stable.  PRE-OPERATIVE WEIGHT: 77kg  COMPLICATIONS: NO KNOWN  XC= 104 min CPB= 141 min  OM 1 intramyocardial

## 2013-05-24 NOTE — Progress Notes (Signed)
Spoke with Dr. Donata Clay via telephone. Aware that CI- 1.25 and aware of CPAP AGB results. Orders received to continue with extubation and draw one hour ABG and Co Ox. Will continue to monitor closely. Patricia Villegas

## 2013-05-24 NOTE — H&P (View-Only) (Signed)
PCP is Gaspar Garbe, MD Referring Provider is Laurey Morale, MD  Chief Complaint  Patient presents with  . Coronary Artery Disease    eval for CABG...CATHED 05/14/13  . Chest Pain    HPI: 78 year old woman with multiple cardiac risk factors presents with a chief complaint of chest pain with exertion and after eating.  Patricia Villegas is a 78 year old woman with multiple cardiac risk factors including type 2 diabetes, hypertension, hyperlipidemia, and peripheral arterial disease. She has been feeling poorly for some time now. She complains of a burning sensation in her chest which radiates to her neck jaw and teeth. This occurs with exertion and also after eating. She initially thought this might be indigestion. She also gets shortness of breath with exertion although is not always correlate exactly with the chest discomfort. She will get short of breath when she walks up an incline or when walking up stairs. She has been having dizzy spells she first stands up. She has not had any syncope.  She saw Dr. Shirlee Latch and he recommended cardiac catheterization, which was done on September 5. It revealed severe three-vessel coronary disease with a left dominant circulation. She had preserved left ventricular function. She also had heavy mitral annular calcification. Echocardiogram was done on 9/8 which showed preserved left ventricular function and minimal mitral regurgitation.  She has not had any rest or nocturnal symptoms.  She does have claudication. She is followed for known bilateral SFA occlusions by Dr. Jaynie Collins. She also has rheumatoid arthritis involving her shoulders and neck. She is on methotrexate and prednisone for that. She's also been told she needs a left hip replacement.  She is very anxious about having bypass grafting because her mother had CABG in [place] in 1985 and ended up "losing both her legs".  Past Medical History  Diagnosis Date  . Diabetes mellitus     Greater  than 20 years  . Hypertension     Greater than 20 years  . Hypercholesteremia   . [name] (peripheral vascular disease)     Stable claudication. Arteriogram 07/2009 no significant aortoiliac disease, has bilateral SFA occlusions with distal reconstitution   . Closed left fibular fracture     Casted   . Lumbar disc disease     S/p surgery x4;  Sciatica   . Complication of anesthesia 1998    Unable to arouse fully, respiratory depression  . Anemia   . Blood transfusion   . Transfusion reaction 1960    Convulsions (? seizure)  . H/O hiatal hernia   . Parastomal hernia of ileal conduit 2009  . Arthritis   . Anxiety   . Depression   . Chest pain     a. Lexiscan Myoview 12/12:  EF 75%, no ischemia or scar;  b.  Echo 12/12:  EF 60-65%, no sig valvular abnormalities     Past Surgical History  Procedure Laterality Date  . Hernia repair  2009  . Back surgery      4 back operations most recently 1995, 1998    Family History  Problem Relation Age of Onset  . Coronary artery disease Mother 46  . Diabetes Mother   . Stroke Mother   . Heart attack Mother   . Cancer Brother   . Heart disease Brother   . Stroke Brother   . Kidney disease Brother   . Cancer Sister   . Diabetes Brother     Social History History  Substance Use Topics  . Smoking  status: Former Smoker -- 0.50 packs/day for 10 years    Types: Cigarettes    Quit date: 09/09/1996  . Smokeless tobacco: Former Neurosurgeon    Types: Chew  . Alcohol Use: No    Current Outpatient Prescriptions  Medication Sig Dispense Refill  . aspirin 81 MG tablet Take 81 mg by mouth daily.      . benazepril (LOTENSIN) 20 MG tablet Take 20 mg by mouth daily.       . carvedilol (COREG) 25 MG tablet Take 12.5-25 mg by mouth 2 (two) times daily with a meal. Takes 25 mg in am and 12.5 mg in pm.      . folic acid (FOLVITE) 1 MG tablet Take 1 mg by mouth daily.      . furosemide (LASIX) 20 MG tablet Take 20 mg by mouth 2 (two) times daily.       Marland Kitchen  glimepiride (AMARYL) 4 MG tablet Take 4 mg by mouth 2 (two) times daily.      . isosorbide mononitrate (IMDUR) 30 MG 24 hr tablet Take 1 tablet (30 mg total) by mouth daily.  90 tablet  3  . JANUVIA 100 MG tablet Take 100 mg by mouth daily.       . methotrexate (RHEUMATREX) 2.5 MG tablet Take 10 mg by mouth once a week.       . nitroGLYCERIN (NITROSTAT) 0.4 MG SL tablet Place 1 tablet (0.4 mg total) under the tongue every 5 (five) minutes as needed for chest pain. Up to 3 tablet. If pain does not subside call 911  25 tablet  2  . predniSONE (DELTASONE) 5 MG tablet Take 7.5 mg by mouth daily. DOSE PACK      . simvastatin (ZOCOR) 80 MG tablet Take 80 mg by mouth at bedtime.         No current facility-administered medications for this visit.    No Known Allergies  Review of Systems  Constitutional: Positive for activity change and fatigue.  Respiratory: Positive for chest tightness and shortness of breath. Negative for wheezing.   Cardiovascular: Positive for chest pain and leg swelling.       Claudication bilaterally  Musculoskeletal: Positive for back pain, joint swelling and arthralgias.  Neurological: Positive for dizziness and numbness.  Hematological: Bruises/bleeds easily.  Psychiatric/Behavioral: The patient is nervous/anxious.   All other systems reviewed and are negative.    BP 178/80  Pulse 59  Resp 16  Ht 5\' 6"  (1.676 m)  Wt 166 lb (75.297 kg)  BMI 26.81 kg/m2  SpO2 97% Physical Exam  Vitals reviewed. Constitutional: She is oriented to person, place, and time. She appears well-developed and well-nourished. No distress.  HENT:  Head: Normocephalic and atraumatic.  Eyes: EOM are normal. Pupils are equal, round, and reactive to light.  Neck: Neck supple. No thyromegaly present.  No carotid bruits  Cardiovascular: Normal rate, regular rhythm and normal heart sounds.  Exam reveals no gallop and no friction rub.   No murmur heard. No palpable dorsalis pedis or posterior  tibial bilaterally  Pulmonary/Chest: Effort normal and breath sounds normal. She has no wheezes. She has no rales.  Abdominal: Soft. There is no tenderness.  Musculoskeletal: She exhibits edema (1+).  Lymphadenopathy:    She has no cervical adenopathy.  Neurological: She is alert and oriented to person, place, and time. No cranial nerve deficit.  No focal motor deficit  Skin: Skin is warm and dry.     Diagnostic Tests: Cardiac catheterization  Procedural Findings:  Hemodynamics:  AO 131/52  LV 141/28  Coronary angiography:  Coronary dominance: Left  Left mainstem: Distal left main tapering, 30% stenosis.  Left anterior descending (LAD): 80% ostial stenosis, 30% proximal stenosis, 30% distal stenosis.  Left circumflex (LCx): 95% ostial LCx stenosis. The LCx is a large, dominant vessel.  Right coronary artery (RCA): Small, non-dominant, 95% long proximal to mid vessel stenosis.  Left ventriculography: Hand LV-gram done with elevated LVEDP. EF 55-60%.  Final Conclusions: Severe 3 vessel disease with preserved EF. There is a very severe ostial LCx stenosis, the LCx is a large, dominant vessel. Discussed with Dr. Clifton James, not a good candidate for PCI. She has CCS 3 anginal symptoms on Coreg and Imdur. I will arrange for CVTS evaluation for CABG. She has not had chest pain at rest or with minor exertion since starting Imdur so will let her go home today. Will get echo to assess valves. She will need close followup, will try to arrange CVTS evaluation today or Monday. Family understands to call EMS with any prolonged chest pain.  Patricia Villegas  05/14/2013  9:01 AM  Echocardiogram Study Conclusions  - Left ventricle: The cavity size was normal. Wall thickness was increased in a pattern of moderate LVH. There was mild concentric hypertrophy. Systolic function was normal. The estimated ejection fraction was in the range of 60% to 65%. Wall motion was normal; there were no regional  wall motion abnormalities. Doppler parameters are consistent with abnormal left ventricular relaxation (grade 1 diastolic dysfunction). Doppler parameters are consistent with high ventricular filling pressure. - Aortic valve: Trivial regurgitation. - Mitral valve: Calcified annulus. Normal thickness leaflets . Mild regurgitation. - Left atrium: The atrium was mildly dilated. - Pulmonary arteries: Systolic pressure was mildly increased. PA peak pressure: 43mm Hg (S).   Impression: 78 year old woman with multiple cardiac risk factors who presents with class III angina. She had severe three-vessel coronary disease with preserved left ventricular function. Her lesions are not amenable to percutaneous intervention. Coronary bypass grafting is indicated for survival benefit and relief of symptoms.  I discussed with the patient and her husband the general nature of the procedure, the need for general anesthesia, and the incisions to be used. We discussed the expected hospital stay, overall recovery and short and long term outcomes. We discussed the indications, risks, benefits, and alternatives. They understand the risks include, but are not limited to death, stroke, MI, DVT/PE, bleeding, possible need for transfusion, infections, cardiac arrhythmias, and other organ system dysfunction including respiratory, renal, or GI complications. I told her that it would be extremely unusual to lose a leg after a saphenous vein harvest, but it is not completely impossible. She accepts the risks and agrees to proceed.  Plan: Coronary bypass grafting on Monday, September 15. She will be admitted on the day of surgery.  Will need stress dose steroids due to long-term prednisone  She needs carotid duplex to r/o ECCOD

## 2013-05-24 NOTE — Progress Notes (Signed)
MD at bedside. Visualizes CXR and 12 lead EKG. Aware that CI remains 1.3. Protocol continued. Orders for additional run of potassium. Will continue to monitor closely. Patricia Villegas

## 2013-05-24 NOTE — Progress Notes (Signed)
CI - 1.1, MD aware and protocol followed. Will continue to monitor. Patricia Villegas d

## 2013-05-24 NOTE — Progress Notes (Signed)
Dr. Dorris Fetch at bedside and orders for Calcium Chloride, 1amp, added to Albumin- -done. Will continue to monitor closely. Gildardo Pounds

## 2013-05-24 NOTE — Progress Notes (Signed)
Pt. Extubated by RT with nursing at bedside. Pt. Tolerated well. Now on 4LNC with O2 sats. 100%. Lungs clear, diminished in bases. Pt. With shallow respirations. No acute distress or SOB noted. No stridor auscultated. Will continue to monitor, educate, assess closely Patricia Villegas

## 2013-05-24 NOTE — Progress Notes (Signed)
CTSP for low index  BP 106/67  Pulse 90  Temp(Src) 98.8 F (37.1 C) (Core (Comment))  Resp 12  Wt 171 lb (77.565 kg)  BMI 27.61 kg/m2  SpO2 99%  PA 29/18, CI= 1.4   Intake/Output Summary (Last 24 hours) at 05/24/13 1649 Last data filed at 05/24/13 1604  Gross per 24 hour  Intake 5040.95 ml  Output   4310 ml  Net 730.95 ml    The swan was repositioned. There was a lot of resistance. Her index has been extremely low since arrival to SICU despite all other parameters being excellent. I question the accuracy of the index. Will follow other clinical parameters- HR, BP, PAP, UO.

## 2013-05-24 NOTE — Progress Notes (Signed)
CT surgery p.m. Rounds  Status post multivessel CABG Hemodynamic stable but index remains 1.6 Excellent urine output Hemoglobin 8.5 Starting to wake up from anesthesia Minimal chest tube drainage  continue current care

## 2013-05-24 NOTE — Progress Notes (Signed)
Family at bedside. Updated on pt. Status and poc. Agreeable and questions answered. Pt. CI remains low, continuing to follow protocol. Gildardo Pounds

## 2013-05-24 NOTE — Anesthesia Preprocedure Evaluation (Addendum)
Anesthesia Evaluation  Patient identified by MRN, date of birth, ID band Patient awake    Reviewed: Allergy & Precautions, H&P , NPO status , Patient's Chart, lab work & pertinent test results  Airway Mallampati: I TM Distance: >3 FB Neck ROM: Full    Dental   Pulmonary shortness of breath and with exertion, pneumonia -, resolved,  05-19-13 Chest x-ray FINDINGS: Lung volumes are stable and within normal limits. Mild eventration of the right hemidiaphragm. Stable cardiac size and mediastinal contours. Mitral annulus calcification. Visualized tracheal air column is within normal limits. No pneumothorax, pulmonary edema, pleural effusion or confluent pulmonary opacity. Calcified atherosclerosis of the aorta. Osteopenia.   IMPRESSION: No acute cardiopulmonary abnormality.           Cardiovascular hypertension, Pt. on medications and Pt. on home beta blockers + angina with exertion + CAD and + Peripheral Vascular Disease  05-17-13 2 D ECHO ------------------------------------------------------------ Study Conclusions  - Left ventricle: The cavity size was normal. Wall thickness   was increased in a pattern of moderate LVH. There was mild   concentric hypertrophy. Systolic function was normal. The   estimated ejection fraction was in the range of 60% to   65%. Wall motion was normal; there were no regional wall   motion abnormalities. Doppler parameters are consistent   with abnormal left ventricular relaxation (grade 1   diastolic dysfunction). Doppler parameters are consistent   with high ventricular filling pressure. - Aortic valve: Trivial regurgitation. - Mitral valve: Calcified annulus. Normal thickness leaflets   . Mild regurgitation. - Left atrium: The atrium was mildly dilated. - Pulmonary arteries: Systolic pressure was mildly   increased. PA peak pressure: 43mm Hg  (S).  ------------------------------------------------------------   Neuro/Psych Anxiety Depression    GI/Hepatic hiatal hernia,   Endo/Other  diabetes, Well Controlled, Type 2, Oral Hypoglycemic Agents  Renal/GU      Musculoskeletal  (+) Arthritis -, Osteoarthritis and Rheumatoid disorders,    Abdominal   Peds  Hematology   Anesthesia Other Findings   Reproductive/Obstetrics                         Anesthesia Physical Anesthesia Plan  ASA: III  Anesthesia Plan: General   Post-op Pain Management:    Induction: Intravenous  Airway Management Planned: Oral ETT  Additional Equipment: Arterial line, CVP, PA Cath and Ultrasound Guidance Line Placement  Intra-op Plan:   Post-operative Plan: Post-operative intubation/ventilation  Informed Consent: I have reviewed the patients History and Physical, chart, labs and discussed the procedure including the risks, benefits and alternatives for the proposed anesthesia with the patient or authorized representative who has indicated his/her understanding and acceptance.     Plan Discussed with: CRNA and Surgeon  Anesthesia Plan Comments:         Anesthesia Quick Evaluation

## 2013-05-25 ENCOUNTER — Encounter (HOSPITAL_COMMUNITY): Payer: Self-pay | Admitting: Thoracic Surgery (Cardiothoracic Vascular Surgery)

## 2013-05-25 ENCOUNTER — Inpatient Hospital Stay (HOSPITAL_COMMUNITY): Payer: Medicare Other

## 2013-05-25 LAB — GLUCOSE, CAPILLARY
Glucose-Capillary: 105 mg/dL — ABNORMAL HIGH (ref 70–99)
Glucose-Capillary: 84 mg/dL (ref 70–99)
Glucose-Capillary: 105 mg/dL — ABNORMAL HIGH (ref 70–99)
Glucose-Capillary: 100 mg/dL — ABNORMAL HIGH (ref 70–99)
Glucose-Capillary: 81 mg/dL (ref 70–99)
Glucose-Capillary: 72 mg/dL (ref 70–99)
Glucose-Capillary: 106 mg/dL — ABNORMAL HIGH (ref 70–99)
Glucose-Capillary: 113 mg/dL — ABNORMAL HIGH (ref 70–99)
Glucose-Capillary: 145 mg/dL — ABNORMAL HIGH (ref 70–99)
Glucose-Capillary: 134 mg/dL — ABNORMAL HIGH (ref 70–99)
Glucose-Capillary: 111 mg/dL — ABNORMAL HIGH (ref 70–99)
Glucose-Capillary: 145 mg/dL — ABNORMAL HIGH (ref 70–99)

## 2013-05-25 LAB — MAGNESIUM
Magnesium: 2.5 mg/dL (ref 1.5–2.5)
Magnesium: 2.3 mg/dL (ref 1.5–2.5)

## 2013-05-25 LAB — CBC
MCH: 31.1 pg (ref 26.0–34.0)
MCHC: 33.8 g/dL (ref 30.0–36.0)
MCV: 92.7 fL (ref 78.0–100.0)
Platelets: 129 10*3/uL — ABNORMAL LOW (ref 150–400)
Platelets: 113 10*3/uL — ABNORMAL LOW (ref 150–400)
RBC: 2.99 MIL/uL — ABNORMAL LOW (ref 3.87–5.11)
RBC: 2.88 MIL/uL — ABNORMAL LOW (ref 3.87–5.11)
WBC: 16 10*3/uL — ABNORMAL HIGH (ref 4.0–10.5)

## 2013-05-25 LAB — POCT I-STAT, CHEM 8
BUN: 16 mg/dL (ref 6–23)
Calcium, Ion: 1.18 mmol/L (ref 1.13–1.30)
Hemoglobin: 9.2 g/dL — ABNORMAL LOW (ref 12.0–15.0)
Sodium: 140 mEq/L (ref 135–145)
TCO2: 23 mmol/L (ref 0–100)

## 2013-05-25 LAB — BASIC METABOLIC PANEL
Calcium: 8.4 mg/dL (ref 8.4–10.5)
GFR calc Af Amer: >90 mL/min (ref >90)
GFR calc non Af Amer: 80 mL/min — ABNORMAL LOW (ref >90)
Sodium: 139 mEq/L (ref 135–145)

## 2013-05-25 LAB — CREATININE, SERUM
GFR calc Af Amer: 71 mL/min — ABNORMAL LOW (ref >90)
GFR calc non Af Amer: 61 mL/min — ABNORMAL LOW (ref >90)

## 2013-05-25 MED ORDER — POTASSIUM CHLORIDE CRYS ER 10 MEQ PO TBCR
10.0000 meq | EXTENDED_RELEASE_TABLET | Freq: Two times a day (BID) | ORAL | Status: DC
  Administered 2013-05-25 (×2): 10 meq via ORAL
  Filled 2013-05-25 (×4): qty 1

## 2013-05-25 MED ORDER — TRAMADOL HCL 50 MG PO TABS
50.0000 mg | ORAL_TABLET | Freq: Four times a day (QID) | ORAL | Status: DC | PRN
  Administered 2013-05-25 – 2013-05-26 (×2): 50 mg via ORAL
  Filled 2013-05-25 (×2): qty 1

## 2013-05-25 MED ORDER — INSULIN DETEMIR 100 UNIT/ML ~~LOC~~ SOLN
10.0000 [IU] | Freq: Once | SUBCUTANEOUS | Status: AC
  Administered 2013-05-25: 10 [IU] via SUBCUTANEOUS
  Filled 2013-05-25: qty 0.1

## 2013-05-25 MED ORDER — INSULIN ASPART 100 UNIT/ML ~~LOC~~ SOLN
0.0000 [IU] | Freq: Once | SUBCUTANEOUS | Status: AC
  Administered 2013-05-25: 8 [IU] via SUBCUTANEOUS

## 2013-05-25 MED ORDER — INSULIN DETEMIR 100 UNIT/ML ~~LOC~~ SOLN
10.0000 [IU] | Freq: Every day | SUBCUTANEOUS | Status: DC
  Filled 2013-05-25: qty 0.1

## 2013-05-25 MED ORDER — FUROSEMIDE 20 MG PO TABS: 20.0000 mg | ORAL_TABLET | Freq: Every day | ORAL | Status: DC

## 2013-05-25 MED ORDER — FUROSEMIDE 20 MG PO TABS
20.0000 mg | ORAL_TABLET | Freq: Every day | ORAL | Status: DC
  Filled 2013-05-25: qty 1

## 2013-05-25 MED ORDER — FUROSEMIDE 10 MG/ML IJ SOLN
40.0000 mg | Freq: Once | INTRAMUSCULAR | Status: AC
  Administered 2013-05-25: 40 mg via INTRAVENOUS
  Filled 2013-05-25: qty 4

## 2013-05-25 MED ORDER — INSULIN ASPART 100 UNIT/ML ~~LOC~~ SOLN
0.0000 [IU] | SUBCUTANEOUS | Status: DC
  Administered 2013-05-25: 8 [IU] via SUBCUTANEOUS
  Administered 2013-05-25 – 2013-05-26 (×2): 2 [IU] via SUBCUTANEOUS

## 2013-05-25 MED FILL — Sodium Chloride IV Soln 0.9%: INTRAVENOUS | Qty: 50 | Status: AC

## 2013-05-25 MED FILL — Heparin Sodium (Porcine) Inj 1000 Unit/ML: INTRAMUSCULAR | Qty: 30 | Status: AC

## 2013-05-25 MED FILL — Potassium Chloride Inj 2 mEq/ML: INTRAVENOUS | Qty: 40 | Status: AC

## 2013-05-25 MED FILL — Magnesium Sulfate Inj 50%: INTRAMUSCULAR | Qty: 10 | Status: AC

## 2013-05-25 NOTE — Progress Notes (Addendum)
      [address]       [place] [address]             [phone]      1 Day Post-Op Procedure(s) (LRB): CORONARY ARTERY BYPASS GRAFTING (CABG) (N/A)  Subjective:  Ms. Cregan states she is feeling pretty good this morning.  She is anxious about her chest tubes coming out this morning.  Objective: Vital signs in last 24 hours: Temp:  [95.9 F (35.5 C)-101.3 F (38.5 C)] 98.6 F (37 C) (09/16 0700) Pulse Rate:  [90-91] 90 (09/16 0700) Cardiac Rhythm:  [-] Atrial paced (09/15 2000) Resp:  [10-24] 10 (09/16 0700) BP: (82-118)/(48-70) 103/67 mmHg (09/16 0400) SpO2:  [96 %-100 %] 96 % (09/16 0700) Arterial Line BP: (81-150)/(46-75) 100/49 mmHg (09/16 0700) FiO2 (%):  [40 %-50 %] 40 % (09/15 1755) Weight:  [179 lb 14.3 oz (81.6 kg)] 179 lb 14.3 oz (81.6 kg) (09/16 0500)  Hemodynamic parameters for last 24 hours: PAP: (22-45)/(12-29) 22/12 mmHg CO:  [2.4 L/min-3.4 L/min] 3.2 L/min CI:  [1.1 L/min/m2-1.7 L/min/m2] 1.6 L/min/m2  Intake/Output from previous day: 09/15 0701 - 09/16 0700 In: 6105.6 [I.V.:4458.6; Blood:367; NG/GT:30; IV Piggyback:1250] Out: 5860 [Urine:3890; Blood:1350; Chest Tube:620]  General appearance: alert, cooperative and no distress Heart: regular rate and rhythm Lungs: clear to auscultation bilaterally Abdomen: soft, non-tender; bowel sounds normal; no masses,  no organomegaly Extremities: edema trace Wound: clean and dry, will remove dressing from sternotomy on Wednesday  Lab Results:  Recent Labs  05/24/13 1930 05/24/13 1941 05/25/13 0400  WBC 13.1*  --  14.2*  HGB 9.0* 8.2* 9.3*  HCT 26.6* 24.0* 27.5*  PLT 128*  --  129*   BMET:  Recent Labs  05/24/13 1941 05/25/13 0400  NA 141 139  K 4.5 4.1  CL 110 107  CO2  --  22  GLUCOSE 114* 95  BUN 13 14  CREATININE 0.90 0.75  CALCIUM  --  8.4    PT/INR:  Recent Labs  05/24/13 1300  LABPROT 16.9*  INR 1.41   ABG    Component Value Date/Time   PHART 7.330*  05/24/2013 1938   HCO3 22.9 05/24/2013 1938   TCO2 22 05/24/2013 1941   ACIDBASEDEF 2.0 05/24/2013 1938   O2SAT 50.1 05/24/2013 2012   CBG (last 3)   Recent Labs  05/24/13 2208 05/24/13 2305 05/25/13 0029  GLUCAP 122* 84 141*    Assessment/Plan: S/P Procedure(s) (LRB): CORONARY ARTERY BYPASS GRAFTING (CABG) (N/A)  1. CV- NSR, brady under pacer- off all cardiac drips 2. Pulm- wean oxygen as tolerated, bibasilar atelectasis continue IS 3. Renal- creatinine WNL, lytes okay mildly volume overloaded, will start low dose Lasix 4. Expected Acute Blood Loss Anemia- Hgb 9.3 will follow 5. DM- remains on insulin drip, will start Levemir and discontinue drip today 6. Dispo- patient stable, POD #1 progression orders   LOS: 1 day    BARRETT, ERIN 05/25/2013  Patient seen and examined, agree with above. Looks great. Dc CT, swan, a line. OOB and ambulate

## 2013-05-25 NOTE — Op Note (Signed)
NAMEMYLANI, GENTRY                ACCOUNT NO.:  [id number]  MEDICAL RECORD NO.:  [id number]  LOCATION:  2S06C                        FACILITY:  [facility]  PHYSICIAN:  Salvatore Decent. Dorris Fetch, M.D.DATE OF BIRTH:  07/17/1935  DATE OF PROCEDURE: DATE OF DISCHARGE:                              OPERATIVE REPORT   PREOPERATIVE DIAGNOSIS:  Severe three-vessel coronary disease with progressive angina.  POSTOPERATIVE DIAGNOSIS:  Severe three-vessel coronary disease with progressive angina.  PROCEDURES:  Median sternotomy, extracorporeal circulation, coronary bypass grafting x4 (left internal mammary artery to left anterior descending, saphenous vein graft to acute marginal, sequential saphenous vein graft to obtuse marginal 1 and posterolateral), endoscopic vein harvest left thigh.  SURGEON:  Salvatore Decent. Dorris Fetch, M.D.  ASSISTANT:  Rowe Clack, P.A.-C.  ANESTHESIA:  General.  FINDINGS:  OM1 intramyocardial and difficult to localize, good quality vessel once localized. Posterolateral 1 mm fair quality. LAD good quality.  Right leg explored- saphenous vein bifurcated just above the knee.  No vein was taken from right.  Vein from left thigh- good quality.  CLINICAL NOTE:  Ms. Kohen is a 78 year old woman with multiple cardiac risk factors who presents with progressive angina associated with exertion and eating.  She underwent cardiac catheterization, which revealed severe three-vessel coronary disease with left dominant circulation.  She had severe mitral annular calcification, but an echocardiogram showed only minimal mitral regurgitation.  Left ventricular function was preserved.  She was advised to undergo coronary artery bypass grafting for myocardial revascularization for survival benefit and relief of symptoms.  The indications, risks, benefits, and alternatives were discussed in detail with the patient.  She understood and accepted the risks and agreed to proceed.  OPERATIVE  NOTE:  Ms. Mcgahee was brought to the preoperative holding area on May 24, 2013, where Anesthesia placed a Swan-Ganz catheter and an arterial blood pressure monitoring line.  Intravenous antibiotics were administered.  She was taken to the operating room, anesthetized, and intubated.  A Foley catheter was placed.  The chest, abdomen, and legs were prepped and draped in the usual sterile fashion.  An incision was made in the medial aspect of the right leg at the level of the knee.  The greater saphenous vein was identified and an attempt to harvest the vein endoscopically was initiated; however, just above the knee, the vein bifurcated twice in rapid succession into multiple small vessels.  No vein was harvested from the right leg.  An incision was made in the medial aspect of the left leg.  The saphenous vein was identified just below the knee and harvested from groin to below the knee endoscopically.  It was a good quality vessel.  2000 units of heparin was administered during the vessel harvest.  While harvesting the saphenous vein, a median sternotomy was performed, and the left internal mammary artery was harvested using standard technique.  It was a good quality vessel with excellent flow.  Once the conduits had been harvested, the remainder of the full heparin dose was given.  The pericardium was opened.  The ascending aorta was inspected.  It was enlarged at 3.8 cm, but it did not meet criteria for replacement.  After confirming adequate anticoagulation  with ACT measurement, the aorta was cannulated via concentric 2-0 Ethibond pledgeted pursestring sutures.  A dual-stage venous cannula was placed via pursestring suture in the right atrial appendage.  Cardiopulmonary bypass was instituted.  The patient was cooled to 32 degrees Celsius.  The coronary arteries were inspected and anastomotic sites were chosen.  The first obtuse marginal branch of the left circumflex could not be  identified at this point in time.  It was later identified while under crossclamp.  The conduits were inspected and cut to length.  A foam pad was placed in the pericardium to insulate the heart and protect the left phrenic nerve.  A temperature probe was placed in myocardial septum and a cardioplegia cannula was placed in the ascending aorta.  The aorta was crossclamped.  The left ventricle was emptied via the aortic root vent.  Cardiac arrest then was achieved with combination of 1 L of cold antegrade blood cardioplegia and topical iced saline. There was a rapid diastolic arrest and myocardial septal cooling to 10 degrees Celsius.  The following distal anastomoses were performed.  First, a reversed saphenous vein graft was placed end-to-side to the acute marginal branch of the right coronary.  The right coronary was nondominant and had a 90% stenosis.  The acute marginal was the only significant branch from the right coronary beyond the stenosis.  It did accept a 1.5-mm probe.  It was thin-walled and fair quality.  The vein was anastomosed end-to-side with a running 7-0 Prolene suture. Cardioplegia was administered.  There was good flow and good hemostasis.  Next, the heart was elevated and attempts were made to identify the first obtuse marginal which was the dominant lateral wall branch.  This was very difficult, although it was ultimately isolated and did turn out to be a good quality vessel.  There were multiple areas where the myocardium had been scored in order to localize the artery.  A side-to- side anastomosis was performed of the vein graft to obtuse marginal 1 with a running 7-0 Prolene suture.  Both vessels were of good quality and OM1 was a 1.5 mm diameter vessel.  There was excellent flow through this anastomosis.  The distal end of the vein then was beveled and anastomosed end-to-side to the posterolateral branch.  The catheterization showed multiple posterolateral  branches arising from the dominant left circumflex.  The only one that was large enough to graft that was also visible on the myocardial surface was selected for grafting.  It turned out to be a 1-mm fair quality target due to size. An end-to-side anastomosis was performed with a running 7-0 Prolene suture.  At completion of this anastomosis, cardioplegia was administered down the vein graft and there was good flow through the graft and good hemostasis at both anastomoses, although there was bleeding from the areas where the myocardium had been dissected in searching for the obtuse marginal.  Next, the left internal mammary artery was brought through a window in the pericardium.  The distal end was beveled.  It was anastomosed end-to- side to the LAD.  Both the LAD and mammary were 1.5 mm good quality vessels and an end-to-side anastomosis was performed with a running 8-0 Prolene suture.  At the completion of the mammary to LAD anastomosis, the bulldog clamp was briefly removed.  Immediate and rapid septal rewarming was noted.  The bulldog clamp was replaced.  The mammary pedicle was tacked at the epicardial surface of the heart with 6-0 Prolene sutures.  Additional  cardioplegia was administered.  The cardioplegia cannula then was removed from the ascending aorta and the proximal vein graft anastomoses were performed to 4.5 mm punch aortotomies with running 6-0 Prolene sutures.  At the completion of the final proximal anastomosis, the patient was placed in Trendelenburg position.  Lidocaine was administered.  The aortic root was de-aired and the aortic crossclamp was removed.  Total crossclamp time was 104 minutes.  The patient initially fibrillated, but spontaneously defibrillated and converted to sinus rhythm.  While rewarming was completed, all proximal and distal anastomoses were inspected for hemostasis.  There was still bleeding from the myocardium in the vicinity of obtuse  marginal 1.  DDD pacing was initiated.  When the patient had rewarmed to a core temperature of 37 degrees Celsius, she was weaned from cardiopulmonary bypass on the first attempt.  Total bypass time was 141 minutes.  The initial cardiac index was greater than 2 L/minute/meter squared.  The patient remained hemodynamically stable throughout postbypass period.  A test dose of protamine was administered and was well tolerated.  The atrial and aortic cannulae were removed.  The remainder of the protamine was administered and ACT confirmed reversal of anticoagulation.  Tachoseal a topical hemostatic agent was placed on the myocardium at the site of the OM1 anastomosis.  The chest was copiously irrigated with warm saline. Hemostasis was achieved.  The pericardium was reapproximated with interrupted 3-0 silk sutures.  It came together easily without tension or kinking the underlying grafts.  A 36-French right-angle chest tube was placed into the left chest.  A 36-French straight chest tube was placed into the mediastinum and a 28-French Blake drain was placed into the posterior pericardium.  The sternum was closed with a combination of single and double heavy gauge stainless steel wires.  The pectoralis fascia, subcutaneous tissue, and skin were closed in standard fashion. All sponge, needle, and instrument counts were correct at the end of the procedure.  The patient was taken from the operating room to the surgical intensive care unit in good condition.     Salvatore Decent Dorris Fetch, M.D.     SCH/MEDQ  D:  05/24/2013  T:  05/25/2013  Job:  [id number]

## 2013-05-25 NOTE — Progress Notes (Signed)
Utilization review completed. Marshayla Mitschke, RN, BSN. 

## 2013-05-25 NOTE — Progress Notes (Signed)
POD # 1 CABG\  Sitting up in bed  Feels well  BP 127/96  Pulse 89  Temp(Src) 97.9 F (36.6 C) (Oral)  Resp 21  Ht 5\' 6"  (1.676 m)  Wt 179 lb 14.3 oz (81.6 kg)  BMI 29.05 kg/m2  SpO2 97%   Intake/Output Summary (Last 24 hours) at 05/25/13 1942 Last data filed at 05/25/13 1900  Gross per 24 hour  Intake 1177.86 ml  Output   1995 ml  Net -817.14 ml    Labs OK  Doing well pod #1

## 2013-05-25 NOTE — Progress Notes (Signed)
Dr. Dorris Fetch called regarding FSBS 231, orders received for dose of 10units Levemir now, recheck CBG in 2hours and use SSI, 2 hours after that and then back to q4hrs, if FSBS remains above 200 times 2 checks then restart Glucostabilizer.

## 2013-05-26 ENCOUNTER — Inpatient Hospital Stay (HOSPITAL_COMMUNITY): Payer: Medicare Other

## 2013-05-26 DIAGNOSIS — I2510 Atherosclerotic heart disease of native coronary artery without angina pectoris: Principal | ICD-10-CM

## 2013-05-26 LAB — BASIC METABOLIC PANEL
Calcium: 8.9 mg/dL (ref 8.4–10.5)
GFR calc Af Amer: 75 mL/min — ABNORMAL LOW (ref >90)
GFR calc non Af Amer: 65 mL/min — ABNORMAL LOW (ref >90)
Glucose, Bld: 90 mg/dL (ref 70–99)
Potassium: 4.2 mEq/L (ref 3.5–5.1)
Sodium: 139 mEq/L (ref 135–145)

## 2013-05-26 LAB — CBC
MCH: 30.4 pg (ref 26.0–34.0)
MCHC: 32.7 g/dL (ref 30.0–36.0)
Platelets: 118 10*3/uL — ABNORMAL LOW (ref 150–400)
RBC: 2.99 MIL/uL — ABNORMAL LOW (ref 3.87–5.11)

## 2013-05-26 LAB — GLUCOSE, CAPILLARY
Glucose-Capillary: 76 mg/dL (ref 70–99)
Glucose-Capillary: 163 mg/dL — ABNORMAL HIGH (ref 70–99)
Glucose-Capillary: 101 mg/dL — ABNORMAL HIGH (ref 70–99)

## 2013-05-26 MED ORDER — SODIUM CHLORIDE 0.9 % IV SOLN: 250.0000 mL | INTRAVENOUS | Status: DC | PRN

## 2013-05-26 MED ORDER — INSULIN DETEMIR 100 UNIT/ML ~~LOC~~ SOLN
10.0000 [IU] | Freq: Two times a day (BID) | SUBCUTANEOUS | Status: DC
  Administered 2013-05-26: 10 [IU] via SUBCUTANEOUS
  Filled 2013-05-26 (×4): qty 0.1

## 2013-05-26 MED ORDER — ALUM & MAG HYDROXIDE-SIMETH 200-200-20 MG/5ML PO SUSP: 15.0000 mL | ORAL | Status: DC | PRN

## 2013-05-26 MED ORDER — GUAIFENESIN-DM 100-10 MG/5ML PO SYRP: 15.0000 mL | ORAL_SOLUTION | ORAL | Status: DC | PRN

## 2013-05-26 MED ORDER — GLIMEPIRIDE 4 MG PO TABS
4.0000 mg | ORAL_TABLET | Freq: Two times a day (BID) | ORAL | Status: DC
  Administered 2013-05-26 – 2013-05-29 (×6): 4 mg via ORAL
  Filled 2013-05-26 (×9): qty 1

## 2013-05-26 MED ORDER — MAGNESIUM HYDROXIDE 400 MG/5ML PO SUSP: 30.0000 mL | Freq: Every day | ORAL | Status: DC | PRN

## 2013-05-26 MED ORDER — METHOTREXATE 2.5 MG PO TABS
10.0000 mg | ORAL_TABLET | ORAL | Status: DC
  Administered 2013-05-28: 10 mg via ORAL
  Filled 2013-05-26: qty 4

## 2013-05-26 MED ORDER — SODIUM CHLORIDE 0.9 % IJ SOLN: 3.0000 mL | INTRAMUSCULAR | Status: DC | PRN

## 2013-05-26 MED ORDER — ALPRAZOLAM 0.25 MG PO TABS
0.2500 mg | ORAL_TABLET | Freq: Four times a day (QID) | ORAL | Status: DC | PRN
  Filled 2013-05-26: qty 1

## 2013-05-26 MED ORDER — INSULIN ASPART 100 UNIT/ML ~~LOC~~ SOLN
3.0000 [IU] | Freq: Three times a day (TID) | SUBCUTANEOUS | Status: DC
  Administered 2013-05-26 – 2013-05-28 (×3): 3 [IU] via SUBCUTANEOUS

## 2013-05-26 MED ORDER — PREDNISONE 5 MG PO TABS
7.5000 mg | ORAL_TABLET | Freq: Every day | ORAL | Status: DC
  Administered 2013-05-28: 7.5 mg via ORAL
  Filled 2013-05-26 (×3): qty 1

## 2013-05-26 MED ORDER — FUROSEMIDE 40 MG PO TABS
40.0000 mg | ORAL_TABLET | Freq: Every day | ORAL | Status: DC
  Administered 2013-05-26 – 2013-05-29 (×4): 40 mg via ORAL
  Filled 2013-05-26 (×4): qty 1

## 2013-05-26 MED ORDER — POTASSIUM CHLORIDE CRYS ER 20 MEQ PO TBCR
20.0000 meq | EXTENDED_RELEASE_TABLET | Freq: Two times a day (BID) | ORAL | Status: DC
  Administered 2013-05-26: 20 meq via ORAL
  Filled 2013-05-26 (×3): qty 1

## 2013-05-26 MED ORDER — INSULIN ASPART 100 UNIT/ML ~~LOC~~ SOLN
0.0000 [IU] | Freq: Three times a day (TID) | SUBCUTANEOUS | Status: DC
  Administered 2013-05-26: 3 [IU] via SUBCUTANEOUS

## 2013-05-26 MED ORDER — FOLIC ACID 1 MG PO TABS
1.0000 mg | ORAL_TABLET | Freq: Every day | ORAL | Status: DC
  Administered 2013-05-28 – 2013-05-29 (×2): 1 mg via ORAL
  Filled 2013-05-26 (×2): qty 1

## 2013-05-26 MED ORDER — MOVING RIGHT ALONG BOOK
Freq: Once | Status: AC
  Administered 2013-05-26: 17:00:00
  Filled 2013-05-26: qty 1

## 2013-05-26 MED ORDER — SODIUM CHLORIDE 0.9 % IJ SOLN
3.0000 mL | Freq: Two times a day (BID) | INTRAMUSCULAR | Status: DC
  Administered 2013-05-26 – 2013-05-28 (×4): 3 mL via INTRAVENOUS

## 2013-05-26 MED ORDER — LINAGLIPTIN 5 MG PO TABS
5.0000 mg | ORAL_TABLET | Freq: Every day | ORAL | Status: DC
  Administered 2013-05-26 – 2013-05-29 (×4): 5 mg via ORAL
  Filled 2013-05-26 (×4): qty 1

## 2013-05-26 NOTE — Progress Notes (Signed)
CARDIAC REHAB PHASE I   PRE:  Rate/Rhythm: 83 SR    BP: sitting 124/70    SaO2: 98 RA  MODE:  Ambulation: 240 ft   POST:  Rate/Rhythm: 87 SR    BP: sitting 144/70     SaO2: 97 RA  Pt tolerated well with RW. Steady. Tired toward end with some hip pain. Showed pt how to get out of recliner and toilet. Pt will have husband with her all the time and daughters will be in and out to assist. Interested in [facility] and will send referral to [place]. [phone]   Elissa Lovett [place] CES, ACSM 05/26/2013 2:13 PM

## 2013-05-26 NOTE — Progress Notes (Signed)
Patient ID: Patricia Villegas, female   DOB: [birth year], 78 y.o.   MRN: [id number]    SUBJECTIVE: Doing well, no complaints.  Still a-pacing.    Marland Kitchen acetaminophen  1,000 mg Oral Q6H   Or  . acetaminophen (TYLENOL) oral liquid 160 mg/5 mL  1,000 mg Per Tube Q6H  . aspirin EC  325 mg Oral Daily   Or  . aspirin  324 mg Per Tube Daily  . atorvastatin  40 mg Oral q1800  . bisacodyl  10 mg Oral Daily   Or  . bisacodyl  10 mg Rectal Daily  . docusate sodium  200 mg Oral Daily  . furosemide  40 mg Oral Daily  . glimepiride  4 mg Oral BID WC  . hydrocortisone sod succinate (SOLU-CORTEF) inj  50 mg Intravenous Q12H  . insulin aspart  0-15 Units Subcutaneous TID WC  . insulin aspart  3 Units Subcutaneous TID WC  . insulin detemir  10 Units Subcutaneous BID  . insulin regular  0-10 Units Intravenous TID WC  . linagliptin  5 mg Oral Daily  . [START ON 05/28/2013] methotrexate  10 mg Oral Q Fri  . pantoprazole  40 mg Oral Daily  . potassium chloride  20 mEq Oral BID  . [START ON 05/28/2013] predniSONE  5 mg Oral Q breakfast  . sodium chloride  3 mL Intravenous Q12H      Filed Vitals:   05/26/13 0600 05/26/13 0700 05/26/13 0800 05/26/13 0803  BP: 136/67 121/78 127/67   Pulse: 80 79 79   Temp:    97.9 F (36.6 C)  TempSrc:    Oral  Resp: 15 15 13    Height:      Weight:      SpO2: 94% 95% 93%     Intake/Output Summary (Last 24 hours) at 05/26/13 0851 Last data filed at 05/26/13 0800  Gross per 24 hour  Intake    840 ml  Output   1765 ml  Net   -925 ml    LABS: Basic Metabolic Panel:  Recent Labs  30/86/57 0400 05/25/13 1600 05/25/13 1616 05/26/13 0400  NA 139  --  140 139  K 4.1  --  4.1 4.2  CL 107  --  104 106  CO2 22  --   --  26  GLUCOSE 95  --  157* 90  BUN 14  --  16 18  CREATININE 0.75 0.89 0.90 0.85  CALCIUM 8.4  --   --  8.9  MG 2.5 2.3  --   --    Liver Function Tests: No results found for this basename: AST, ALT, ALKPHOS, BILITOT, PROT, ALBUMIN,  in the  last 72 hours No results found for this basename: LIPASE, AMYLASE,  in the last 72 hours CBC:  Recent Labs  05/25/13 1600 05/25/13 1616 05/26/13 0400  WBC 16.0*  --  15.5*  HGB 9.0* 9.2* 9.1*  HCT 26.7* 27.0* 27.8*  MCV 92.7  --  93.0  PLT 113*  --  118*   Cardiac Enzymes: No results found for this basename: CKTOTAL, CKMB, CKMBINDEX, TROPONINI,  in the last 72 hours BNP: No components found with this basename: POCBNP,  D-Dimer: No results found for this basename: DDIMER,  in the last 72 hours Hemoglobin A1C: No results found for this basename: HGBA1C,  in the last 72 hours Fasting Lipid Panel: No results found for this basename: CHOL, HDL, LDLCALC, TRIG, CHOLHDL, LDLDIRECT,  in the last 72  hours Thyroid Function Tests: No results found for this basename: TSH, T4TOTAL, FREET3, T3FREE, THYROIDAB,  in the last 72 hours Anemia Panel: No results found for this basename: VITAMINB12, FOLATE, FERRITIN, TIBC, IRON, RETICCTPCT,  in the last 72 hours  RADIOLOGY: Dg Chest 2 View  05/19/2013   CLINICAL DATA:  78 year old female preoperative study for cardiac surgery.  EXAM: CHEST  2 VIEW  COMPARISON:  08/23/2011.  FINDINGS: Lung volumes are stable and within normal limits. Mild eventration of the right hemidiaphragm. Stable cardiac size and mediastinal contours. Mitral annulus calcification. Visualized tracheal air column is within normal limits. No pneumothorax, pulmonary edema, pleural effusion or confluent pulmonary opacity. Calcified atherosclerosis of the aorta. Osteopenia.  IMPRESSION: No acute cardiopulmonary abnormality.   Electronically Signed   By: Augusto Gamble M.D.   On: 05/19/2013 17:11   Dg Chest Port 1 View  05/26/2013   *RADIOLOGY REPORT*  Clinical Data: Status post thoracic surgery.  PORTABLE CHEST - 1 VIEW  Comparison: May 25, 2013.  Findings: Left-sided chest tube has been removed.  Minimal left apical pneumothorax is noted.  Status post coronary artery bypass graft.  Right  internal jugular Swan-Ganz catheter has been removed. Stable cardiomegaly.  Right lung is clear.  Left basilar opacity is noted consistent with atelectasis with associated pleural effusion.  IMPRESSION: Minimal left apical pneumothorax is noted following left-sided chest tube removal.  Stable left basilar opacity is noted consistent with subsegmental atelectasis with probable associated pleural effusion.   Original Report Authenticated By: Lupita Raider.,  M.D.   Dg Chest Portable 1 View In Am  05/25/2013   CLINICAL DATA:  Status post CABG.  EXAM: PORTABLE CHEST - 1 VIEW  COMPARISON:  05/24/2013  FINDINGS: Stable heart size and mediastinal contours. No pneumothorax is visualized. Endotracheal tube has been removed. Swan-Ganz catheter tip lies in the proximal right pulmonary artery. Mildly increased left lower lobe atelectasis. Aeration of the right lower lung is improved. No pulmonary edema.  IMPRESSION: No pneumothorax. Mildly increased left lower lobe atelectasis and improved aeration at the right lung base.   Electronically Signed   By: Irish Lack   On: 05/25/2013 08:00   Dg Chest Portable 1 View  05/24/2013   *RADIOLOGY REPORT*  Clinical Data: Post CABG  PORTABLE CHEST - 1 VIEW  Comparison: 05/19/2013  Findings: Endotracheal and nasogastric tubes appropriately positioned.  Chest tubes and mediastinal drains are in place. Trace effusions are noted bilaterally.  Evidence of CABG.  Right IJ Swan-Ganz catheter tip terminates over the right main pulmonary artery.  No pneumothorax.  Minimal patchy bibasilar presumed atelectasis.  IMPRESSION: Trace effusions post CABG with trace bilateral lower lobe atelectasis.   Original Report Authenticated By: Christiana Pellant, M.D.    PHYSICAL EXAM General: NAD Neck: No JVD, no thyromegaly or thyroid nodule.  Lungs: Clear to auscultation bilaterally with normal respiratory effort. CV: Nondisplaced PMI.  Heart regular S1/S2, no S3/S4, no murmur.  1+ ankle edema.   No carotid bruit.  Normal pedal pulses.  Abdomen: Soft, nontender, no hepatosplenomegaly, no distention.  Neurologic: Alert and oriented x 3.  Psych: Normal affect. Extremities: No clubbing or cyanosis.   TELEMETRY: Reviewed telemetry pt in a-paced  ASSESSMENT AND PLAN: 78 yo with history of RA now s/p CABG.  She is doing well post-op.   - Continue ASA, statin. - Gentle Lasix for volume overload.  - Hold metoprolol today and likely can remove pacing wires.  Will start back low dose beta blocker eventually  most likely.   - Will begin low dose ACEI eventually as well.  [name] 05/26/2013 8:54 AM

## 2013-05-26 NOTE — Plan of Care (Signed)
Problem: Phase III Progression Outcomes Goal: Time patient transferred to PCTU/Telemetry POD Outcome: Completed/Met Date Met:  05/26/13 1125 Transferred to 2W14 via wheelchair. Portable monitor on.  No changes.

## 2013-05-26 NOTE — Progress Notes (Signed)
2 Days Post-Op Procedure(s) (LRB): CORONARY ARTERY BYPASS GRAFTING (CABG) (N/A) Subjective: No complaints this AM  Objective: Vital signs in last 24 hours: Temp:  [97.7 F (36.5 C)-98.8 F (37.1 C)] 97.9 F (36.6 C) (09/17 0803) Pulse Rate:  [79-92] 79 (09/17 0800) Cardiac Rhythm:  [-] Atrial paced (09/17 0400) Resp:  [12-24] 13 (09/17 0800) BP: (89-145)/(56-96) 127/67 mmHg (09/17 0800) SpO2:  [92 %-99 %] 93 % (09/17 0800) Arterial Line BP: (108-164)/(43-69) 118/51 mmHg (09/16 1300) Weight:  [180 lb 12.4 oz (82 kg)] 180 lb 12.4 oz (82 kg) (09/17 0500)  Hemodynamic parameters for last 24 hours: PAP: (23-30)/(13-15) 26/15 mmHg  Intake/Output from previous day: 09/16 0701 - 09/17 0700 In: 910 [P.O.:240; I.V.:520; IV Piggyback:150] Out: 1800 [Urine:1560; Chest Tube:240] Intake/Output this shift: Total I/O In: 20 [I.V.:20] Out: 40 [Urine:40]  General appearance: alert and no distress Neurologic: intact Heart: regular rate and rhythm Lungs: diminished breath sounds left base Abdomen: normal findings: soft, non-tender  Lab Results:  Recent Labs  05/25/13 1600 05/25/13 1616 05/26/13 0400  WBC 16.0*  --  15.5*  HGB 9.0* 9.2* 9.1*  HCT 26.7* 27.0* 27.8*  PLT 113*  --  118*   BMET:  Recent Labs  05/25/13 0400  05/25/13 1616 05/26/13 0400  NA 139  --  140 139  K 4.1  --  4.1 4.2  CL 107  --  104 106  CO2 22  --   --  26  GLUCOSE 95  --  157* 90  BUN 14  --  16 18  CREATININE 0.75  < > 0.90 0.85  CALCIUM 8.4  --   --  8.9  < > = values in this interval not displayed.  PT/INR:  Recent Labs  05/24/13 1300  LABPROT 16.9*  INR 1.41   ABG    Component Value Date/Time   PHART 7.330* 05/24/2013 1938   HCO3 22.9 05/24/2013 1938   TCO2 23 05/25/2013 1616   ACIDBASEDEF 2.0 05/24/2013 1938   O2SAT 50.1 05/24/2013 2012   CBG (last 3)   Recent Labs  05/25/13 2233 05/26/13 0019 05/26/13 0424  GLUCAP 222* 156* 76    Assessment/Plan: S/P Procedure(s)  (LRB): CORONARY ARTERY BYPASS GRAFTING (CABG) (N/A) Plan for transfer to step-down: see transfer orders  CV_ bradycardic in 50s under pacer- will stop metoprolol, dc pacing once native rate picks up  Will try to restart coreg prior to dc if HR allows  RESP- LLL atelectasis- IS  RENAL- lytes, creatinine OK- diurese  ENDO- CBG up last night- will increase levemir, restart PO meds  Anemia secondary to ABL- mild, follow   LOS: 2 days    Patricia Villegas C 05/26/2013

## 2013-05-27 LAB — GLUCOSE, CAPILLARY
Glucose-Capillary: 63 mg/dL — ABNORMAL LOW (ref 70–99)
Glucose-Capillary: 48 mg/dL — ABNORMAL LOW (ref 70–99)

## 2013-05-27 LAB — TYPE AND SCREEN: Unit division: 0

## 2013-05-27 LAB — BASIC METABOLIC PANEL
BUN: 22 mg/dL (ref 6–23)
CO2: 28 mEq/L (ref 19–32)
Calcium: 8.8 mg/dL (ref 8.4–10.5)
Chloride: 106 mEq/L (ref 96–112)
Creatinine, Ser: 0.95 mg/dL (ref 0.50–1.10)

## 2013-05-27 LAB — CBC
HCT: 27.3 % — ABNORMAL LOW (ref 36.0–46.0)
MCH: 30.9 pg (ref 26.0–34.0)
MCV: 93.8 fL (ref 78.0–100.0)
RDW: 18 % — ABNORMAL HIGH (ref 11.5–15.5)
WBC: 13.3 10*3/uL — ABNORMAL HIGH (ref 4.0–10.5)

## 2013-05-27 MED ORDER — LISINOPRIL 5 MG PO TABS
5.0000 mg | ORAL_TABLET | Freq: Every day | ORAL | Status: DC
  Filled 2013-05-27 (×2): qty 1

## 2013-05-27 MED ORDER — DEXTROSE 50 % IV SOLN
INTRAVENOUS | Status: AC
  Administered 2013-05-27: 50 mL
  Filled 2013-05-27: qty 50

## 2013-05-27 MED ORDER — POTASSIUM CHLORIDE CRYS ER 20 MEQ PO TBCR
40.0000 meq | EXTENDED_RELEASE_TABLET | Freq: Two times a day (BID) | ORAL | Status: DC
  Administered 2013-05-27 – 2013-05-28 (×4): 40 meq via ORAL
  Filled 2013-05-27 (×5): qty 2

## 2013-05-27 MED FILL — Sodium Chloride Irrigation Soln 0.9%: Qty: 3000 | Status: AC

## 2013-05-27 MED FILL — Lidocaine HCl IV Inj 20 MG/ML: INTRAVENOUS | Qty: 5 | Status: AC

## 2013-05-27 MED FILL — Electrolyte-R (PH 7.4) Solution: INTRAVENOUS | Qty: 3000 | Status: AC

## 2013-05-27 MED FILL — Sodium Bicarbonate IV Soln 8.4%: INTRAVENOUS | Qty: 50 | Status: AC

## 2013-05-27 MED FILL — Mannitol IV Soln 20%: INTRAVENOUS | Qty: 500 | Status: AC

## 2013-05-27 MED FILL — Heparin Sodium (Porcine) Inj 1000 Unit/ML: INTRAMUSCULAR | Qty: 30 | Status: AC

## 2013-05-27 NOTE — Care Management Note (Addendum)
    Page 1 of 2   05/28/2013     2:20:35 PM   CARE MANAGEMENT NOTE 05/28/2013  Patient:  Patricia Villegas, Patricia Villegas   Account Number:  [id number]  Date Initiated:  05/27/2013  Documentation initiated by:  Suki Crockett  Subjective/Objective Assessment:   PT S/P CABG X 4 ON 05/25/13.  PTA, PT INDEPENDENT, LIVES WITH SPOUSE.     Action/Plan:   WILL FOLLOW FOR HOME NEEDS AS PT PROGRESSES.  HAS RW AT HOME, IF NEEDED.   Anticipated DC Date:  05/29/2013   Anticipated DC Plan:  HOME W HOME HEALTH SERVICES      DC Planning Services  CM consult      [facility] Choice  HOME HEALTH   Choice offered to / List presented to:  C-1 Patient   DME arranged  BEDSIDE COMMODE      DME agency  [facility]     HH arranged  HH-1 RN  HH-2 PT      [facility] agency  [facility]   Status of service:  Completed, signed off Medicare Important Message given?   (If response is "NO", the following Medicare IM given date fields will be blank) Date Medicare IM given:   Date Additional Medicare IM given:    Discharge Disposition:  HOME W HOME HEALTH SERVICES  Per UR Regulation:  Reviewed for med. necessity/level of care/duration of stay  If discussed at Long Length of Stay Meetings, dates discussed:    Comments:  05/28/13 Gustaf Mccarter,RN,BSN [phone] PT STATES WILL NOT NEED [facility] FOR HOME.  REFERRAL TO [facility], PER PT CHOICE.  START OF CARE 24-48H POST DC DATE. REFERRAL TO [facility] FOR DME NEEDS.  05/27/13 Jere Vanburen,RN,BSN [phone] MET WITH PT TO DISCUSS DC PLANS.  HUSBAND TO PROVIDE 24H CARE AT DC.  REQUESTS [facility] FOR HOME; MAY NEED SHOWER CHAIR IF SHE CANNOT BORROW ONE.  PT WOULD LIKE HH FOLLOW UP IF POSSIBLE; WOULD RECOMMEND [facility] FOR RESTORATIVE CARE AND HHPT.  WILL ARRANGE AS ORDERED.

## 2013-05-27 NOTE — Progress Notes (Addendum)
[address]       [place] [address]             [phone]      3 Days Post-Op  Procedure(s) (LRB): CORONARY ARTERY BYPASS GRAFTING (CABG) (N/A) Subjective:  Had "rough" morning, sugar went to 33 and was symptomatic feeling poorly and diaphoretic  Objective  Telemetry sinus rhythm  Temp:  [97.4 F (36.3 C)-98.1 F (36.7 C)] 97.4 F (36.3 C) (09/18 0600) Pulse Rate:  [63-73] 65 (09/18 0600) Resp:  [18-22] 18 (09/18 0600) BP: (113-143)/(49-80) 131/73 mmHg (09/18 0600) SpO2:  [95 %-98 %] 97 % (09/18 0600) Weight:  [178 lb (80.74 kg)] 178 lb (80.74 kg) (09/18 0600)   Intake/Output Summary (Last 24 hours) at 05/27/13 0809 Last data filed at 05/27/13 0612  Gross per 24 hour  Intake    640 ml  Output   2076 ml  Net  -1436 ml       General appearance: alert, cooperative and no distress Heart: regular rate and rhythm Lungs: sl coarse, end exp wheeze Abdomen: benign Extremities: mild edema Wound: dressing on chest intact, EVH incis healing well  Lab Results:  Recent Labs  05/25/13 0400 05/25/13 1600  05/26/13 0400 05/27/13 0524  NA 139  --   < > 139 142  K 4.1  --   < > 4.2 3.5  CL 107  --   < > 106 106  CO2 22  --   --  26 28  GLUCOSE 95  --   < > 90 52*  BUN 14  --   < > 18 22  CREATININE 0.75 0.89  < > 0.85 0.95  CALCIUM 8.4  --   --  8.9 8.8  MG 2.5 2.3  --   --   --   < > = values in this interval not displayed. No results found for this basename: AST, ALT, ALKPHOS, BILITOT, PROT, ALBUMIN,  in the last 72 hours No results found for this basename: LIPASE, AMYLASE,  in the last 72 hours  Recent Labs  05/26/13 0400 05/27/13 0524  WBC 15.5* 13.3*  HGB 9.1* 9.0*  HCT 27.8* 27.3*  MCV 93.0 93.8  PLT 118* 128*   No results found for this basename: CKTOTAL, CKMB, TROPONINI,  in the last 72 hours No components found with this basename: POCBNP,  No results found for this basename: DDIMER,  in the last 72 hours No results found  for this basename: HGBA1C,  in the last 72 hours No results found for this basename: CHOL, HDL, LDLCALC, TRIG, CHOLHDL,  in the last 72 hours No results found for this basename: TSH, T4TOTAL, FREET3, T3FREE, THYROIDAB,  in the last 72 hours No results found for this basename: VITAMINB12, FOLATE, FERRITIN, TIBC, IRON, RETICCTPCT,  in the last 72 hours  Medications: Scheduled . acetaminophen  1,000 mg Oral Q6H   Or  . acetaminophen (TYLENOL) oral liquid 160 mg/5 mL  1,000 mg Per Tube Q6H  . aspirin EC  325 mg Oral Daily   Or  . aspirin  324 mg Per Tube Daily  . atorvastatin  40 mg Oral q1800  . bisacodyl  10 mg Oral Daily   Or  . bisacodyl  10 mg Rectal Daily  . docusate sodium  200 mg Oral Daily  . [START ON 05/28/2013] folic acid  1 mg Oral Daily  . furosemide  40 mg Oral Daily  . glimepiride  4 mg Oral BID WC  . insulin aspart  0-15 Units Subcutaneous TID WC  . insulin aspart  3 Units Subcutaneous TID WC  . insulin detemir  10 Units Subcutaneous BID  . linagliptin  5 mg Oral Daily  . [START ON 05/28/2013] methotrexate  10 mg Oral Q Fri  . pantoprazole  40 mg Oral Daily  . potassium chloride  20 mEq Oral BID  . [START ON 05/28/2013] predniSONE  7.5 mg Oral Q breakfast  . sodium chloride  3 mL Intravenous Q12H     Radiology/Studies:  Dg Chest Port 1 View  05/26/2013   *RADIOLOGY REPORT*  Clinical Data: Status post thoracic surgery.  PORTABLE CHEST - 1 VIEW  Comparison: May 25, 2013.  Findings: Left-sided chest tube has been removed.  Minimal left apical pneumothorax is noted.  Status post coronary artery bypass graft.  Right internal jugular Swan-Ganz catheter has been removed. Stable cardiomegaly.  Right lung is clear.  Left basilar opacity is noted consistent with atelectasis with associated pleural effusion.  IMPRESSION: Minimal left apical pneumothorax is noted following left-sided chest tube removal.  Stable left basilar opacity is noted consistent with subsegmental  atelectasis with probable associated pleural effusion.   Original Report Authenticated By: Lupita Raider.,  M.D.    INR: Will add last result for INR, ABG once components are confirmed Will add last 4 CBG results once components are confirmed  Assessment/Plan: S/P Procedure(s) (LRB): CORONARY ARTERY BYPASS GRAFTING (CABG) (N/A)  1 hypoglycemic episode, will d/c detimir insulin at this time 2 push pulm toilet 3 mobilize 4 gentle diuresis, replace K+ 5 BP can tol low dose ACE inhib 6 creat stable.  7 H/H stable ABL anemia   LOS: 3 days    GOLD,WAYNE E 9/18/20148:09 AM  Patient seen and examined, agree with above

## 2013-05-27 NOTE — Progress Notes (Signed)
CARDIAC REHAB PHASE I   PRE:  Rate/Rhythm: 78SR  BP:  Supine: 138/76  Sitting:   Standing:    SaO2: 95%RA  MODE:  Ambulation: 350 ft   POST:  Rate/Rhythm: 103ST  BP:  Supine:   Sitting: 166/80  Standing:    SaO2: 93%RA 1125-1200 Pt walked 350 ft on RA with rolling walker and asst x 1 with steady gait. Tolerated well. Some SOB during walk when pt tried to talk and walk. To recliner after walk with call bell. Sats good on RA.    Luetta Nutting, RN BSN  05/27/2013 11:54 AM

## 2013-05-28 LAB — BASIC METABOLIC PANEL
BUN: 17 mg/dL (ref 6–23)
CO2: 30 mEq/L (ref 19–32)
Chloride: 110 mEq/L (ref 96–112)
Creatinine, Ser: 0.83 mg/dL (ref 0.50–1.10)
Glucose, Bld: 138 mg/dL — ABNORMAL HIGH (ref 70–99)

## 2013-05-28 LAB — GLUCOSE, CAPILLARY
Glucose-Capillary: 146 mg/dL — ABNORMAL HIGH (ref 70–99)
Glucose-Capillary: 163 mg/dL — ABNORMAL HIGH (ref 70–99)
Glucose-Capillary: 133 mg/dL — ABNORMAL HIGH (ref 70–99)

## 2013-05-28 MED ORDER — METOPROLOL TARTRATE 12.5 MG HALF TABLET
12.5000 mg | ORAL_TABLET | Freq: Two times a day (BID) | ORAL | Status: DC
  Administered 2013-05-28 – 2013-05-29 (×3): 12.5 mg via ORAL
  Filled 2013-05-28 (×4): qty 1

## 2013-05-28 MED ORDER — LISINOPRIL 10 MG PO TABS
10.0000 mg | ORAL_TABLET | Freq: Every day | ORAL | Status: DC
  Administered 2013-05-28: 10 mg via ORAL
  Filled 2013-05-28 (×2): qty 1

## 2013-05-28 NOTE — Progress Notes (Addendum)
[address]       [place] [address]             [phone]      4 Days Post-Op  Procedure(s) (LRB): CORONARY ARTERY BYPASS GRAFTING (CABG) (N/A) Subjective: conts to do well, feels good about her progress  Objective  Telemetry sinus rhythmwith PAC's  Temp:  [97.9 F (36.6 C)-98.2 F (36.8 C)] 97.9 F (36.6 C) (09/19 0559) Pulse Rate:  [78-90] 78 (09/19 0559) Resp:  [18] 18 (09/19 0559) BP: (144-159)/(53-84) 151/84 mmHg (09/19 0559) SpO2:  [97 %-98 %] 97 % (09/19 0559) Weight:  [177 lb 11.1 oz (80.6 kg)] 177 lb 11.1 oz (80.6 kg) (09/19 0500)   Intake/Output Summary (Last 24 hours) at 05/28/13 9147 Last data filed at 05/28/13 0615  Gross per 24 hour  Intake   1690 ml  Output   1600 ml  Net     90 ml       General appearance: alert, cooperative and no distress Heart: regular rate and rhythm Lungs: dim in left base Abdomen: benign Extremities: no sig edema Wound: incisions healing well  Lab Results:  Recent Labs  05/25/13 1600  05/27/13 0524 05/28/13 0414  NA  --   < > 142 145  K  --   < > 3.5 4.3  CL  --   < > 106 110  CO2  --   < > 28 30  GLUCOSE  --   < > 52* 138*  BUN  --   < > 22 17  CREATININE 0.89  < > 0.95 0.83  CALCIUM  --   < > 8.8 8.3*  MG 2.3  --   --   --   < > = values in this interval not displayed. No results found for this basename: AST, ALT, ALKPHOS, BILITOT, PROT, ALBUMIN,  in the last 72 hours No results found for this basename: LIPASE, AMYLASE,  in the last 72 hours  Recent Labs  05/26/13 0400 05/27/13 0524  WBC 15.5* 13.3*  HGB 9.1* 9.0*  HCT 27.8* 27.3*  MCV 93.0 93.8  PLT 118* 128*   No results found for this basename: CKTOTAL, CKMB, TROPONINI,  in the last 72 hours No components found with this basename: POCBNP,  No results found for this basename: DDIMER,  in the last 72 hours No results found for this basename: HGBA1C,  in the last 72 hours No results found for this basename: CHOL, HDL,  LDLCALC, TRIG, CHOLHDL,  in the last 72 hours No results found for this basename: TSH, T4TOTAL, FREET3, T3FREE, THYROIDAB,  in the last 72 hours No results found for this basename: VITAMINB12, FOLATE, FERRITIN, TIBC, IRON, RETICCTPCT,  in the last 72 hours  Medications: Scheduled . acetaminophen  1,000 mg Oral Q6H   Or  . acetaminophen (TYLENOL) oral liquid 160 mg/5 mL  1,000 mg Per Tube Q6H  . aspirin EC  325 mg Oral Daily   Or  . aspirin  324 mg Per Tube Daily  . atorvastatin  40 mg Oral q1800  . bisacodyl  10 mg Oral Daily   Or  . bisacodyl  10 mg Rectal Daily  . docusate sodium  200 mg Oral Daily  . folic acid  1 mg Oral Daily  . furosemide  40 mg Oral Daily  . glimepiride  4 mg Oral BID WC  . insulin aspart  0-15 Units Subcutaneous TID WC  . insulin  aspart  3 Units Subcutaneous TID WC  . linagliptin  5 mg Oral Daily  . lisinopril  5 mg Oral Daily  . methotrexate  10 mg Oral Q Fri  . pantoprazole  40 mg Oral Daily  . potassium chloride  40 mEq Oral BID  . predniSONE  7.5 mg Oral Q breakfast  . sodium chloride  3 mL Intravenous Q12H     Radiology/Studies:  No results found.  INR: Will add last result for INR, ABG once components are confirmed Will add last 4 CBG results once components are confirmed  Assessment/Plan: S/P Procedure(s) (LRB): CORONARY ARTERY BYPASS GRAFTING (CABG) (N/A)  1 Conts to do well 2 will increase ace inhibitor 3 sugars adequately controlled 4 add low dose beta blocker with PAC's- plan to d/c epw's in am 5 poss home in am   LOS: 4 days    GOLD,WAYNE E 9/19/20148:22 AM  Patient seen and examined, agree with above

## 2013-05-28 NOTE — Progress Notes (Signed)
CARDIAC REHAB PHASE I   PRE:  Rate/Rhythm: 80SR  BP:  Supine:   Sitting: 140/70  Standing:    SaO2: 98%RA  MODE:  Ambulation: 550 ft   POST:  Rate/Rhythm: 97  BP:  Supine:   Sitting: 168/72  Standing:    SaO2: 96-100%RA 1015-1100 Pt walked 550 ft with rolling walker and minimal asst. Gait steady. Tolerated well. Stopped once to walk. Education completed with pt,husband, and son. Understanding voiced. Wrote down post op video information for pt to view later. Referring to [place] Phase 2. Pt has access to rolling walker for home use.    Luetta Nutting, RN BSN  05/28/2013 11:57 AM

## 2013-05-28 NOTE — Discharge Summary (Signed)
[address]       [place] [address]             [phone]       Patricia Villegas 1934/10/24 78 y.o. [id number]  05/24/2013   Loreli Slot, MD  cad   HPI:  The patient is a 78 year old female with multiple cardiac risk factors who presents with a chief complaint of chest pain. She complains of a burning sensation in her chest which radiates to her neck, jaw and teeth. This occurs with exertion and also after eating. She initially the symptoms might be indigestion. She also gets shortness of breath with exertion although this does not always correlate exactly with her chest discomfort. She will get short of breath when she walks up an incline or when she walks up stairs. She additionally has been having dizzy spells when she first and sat. She has not had any syncope. She was seen by Dr Shirlee Latch and underwent cardiac catheterization. She was found to have severe three-vessel coronary artery disease and was referred to Charlett Lango M.D. For surgical opinion. Dr. Dorris Fetch agree with recommendations to proceed with surgical coronary artery revascularization. Past Medical History   Diagnosis  Date   .  Diabetes mellitus      Greater than 20 years   .  Hypertension      Greater than 20 years   .  Hypercholesteremia    .  [name] (peripheral vascular disease)      Stable claudication. Arteriogram 07/2009 no significant aortoiliac disease, has bilateral SFA occlusions with distal reconstitution   .  Closed left fibular fracture      Casted   .  Lumbar disc disease      S/p surgery x4; Sciatica   .  Complication of anesthesia  1998     Unable to arouse fully, respiratory depression   .  Anemia    .  Blood transfusion    .  Transfusion reaction  1960     Convulsions (? seizure)   .  H/O hiatal hernia    .  Parastomal hernia of ileal conduit  2009   .  Arthritis    .  Anxiety    .  Depression    .  Chest pain      a. Lexiscan Myoview 12/12: EF 75%,  no ischemia or scar; b. Echo 12/12: EF 60-65%, no sig valvular abnormalities    Past Surgical History   Procedure  Laterality  Date   .  Hernia repair   2009   .  Back surgery       4 back operations most recently 1995, 1998    Family History   Problem  Relation  Age of Onset   .  Coronary artery disease  Mother  79   .  Diabetes  Mother    .  Stroke  Mother    .  Heart attack  Mother    .  Cancer  Brother    .  Heart disease  Brother    .  Stroke  Brother    .  Kidney disease  Brother    .  Cancer  Sister    .  Diabetes  Brother     Social History  History   Substance Use Topics   .  Smoking status:  Former Smoker -- 0.50 packs/day for 10 years     Types:  Cigarettes  Quit date:  09/09/1996   .  Smokeless tobacco:  Former Neurosurgeon     Types:  Chew   .  Alcohol Use:  No    Current Outpatient Prescriptions   Medication  Sig  Dispense  Refill   .  aspirin 81 MG tablet  Take 81 mg by mouth daily.     .  benazepril (LOTENSIN) 20 MG tablet  Take 20 mg by mouth daily.     .  carvedilol (COREG) 25 MG tablet  Take 12.5-25 mg by mouth 2 (two) times daily with a meal. Takes 25 mg in am and 12.5 mg in pm.     .  folic acid (FOLVITE) 1 MG tablet  Take 1 mg by mouth daily.     .  furosemide (LASIX) 20 MG tablet  Take 20 mg by mouth 2 (two) times daily.     Marland Kitchen  glimepiride (AMARYL) 4 MG tablet  Take 4 mg by mouth 2 (two) times daily.     .  isosorbide mononitrate (IMDUR) 30 MG 24 hr tablet  Take 1 tablet (30 mg total) by mouth daily.  90 tablet  3   .  JANUVIA 100 MG tablet  Take 100 mg by mouth daily.     .  methotrexate (RHEUMATREX) 2.5 MG tablet  Take 10 mg by mouth once a week.     .  nitroGLYCERIN (NITROSTAT) 0.4 MG SL tablet  Place 1 tablet (0.4 mg total) under the tongue every 5 (five) minutes as needed for chest pain. Up to 3 tablet. If pain does not subside call 911  25 tablet  2   .  predniSONE (DELTASONE) 5 MG tablet  Take 7.5 mg by mouth daily. DOSE PACK     .  simvastatin  (ZOCOR) 80 MG tablet  Take 80 mg by mouth at bedtime.      No current facility-administered medications for this visit.    No Known Allergies   She was readmitted and on 05/24/2013 taken to the operating room at which time she underwent the following procedure: OPERATIVE REPORT  PREOPERATIVE DIAGNOSIS: Severe three-vessel coronary disease with  progressive angina.  POSTOPERATIVE DIAGNOSIS: Severe three-vessel coronary disease with  progressive angina.  PROCEDURES: Median sternotomy, extracorporeal circulation, coronary  bypass grafting x4 (left internal mammary artery to left anterior  descending, saphenous vein graft to acute marginal, sequential saphenous  vein graft to obtuse marginal 1 and posterolateral), endoscopic vein  harvest left thigh.  SURGEON: Salvatore Decent. Dorris Fetch, M.D.  ASSISTANT: Rowe Clack, P.A.-C.  ANESTHESIA: General.  FINDINGS: OM1 intramyocardial difficult to localize, good quality  vessel once localized, posterolateral 1 mm fair quality, LAD good  quality. Right leg explored. Saphenous vein bifurcated just above the  knee. No vein was taken. Vein from left thigh good quality.  The patient was taken from the operating room to the  surgical intensive care unit in good condition.  Postoperative hospital course:  The patient has overall progressed nicely. He was weaned from the ventilator without significant difficulty.cardiac indices early postoperatively were very low, but it was determined that the Swan-Ganz measurements were not accurate and she was hemodynamically stable by all other parameters. All routine lines, monitors and drainage devices were discontinued in the standard fashion.she did have an expected acute blood loss anemia and values have stabilized. Most recent hematocrit dated 05/27/2013 is 27.urine output has been good and she is noted to have a moderate volume overload. She has responded well to  diuretics. Most recent creatinine dated 05/28/2013  0.83. She is tolerating gradually increasing activities using standard postoperative cardiac rehabilitation protocols. Oxygen has been weaned and she maintains adequate saturations on room air. Incisions are healing well without evidence of infection. She has had no significant cardiac dysrhythmias. Tentatively she is felt to be stable for discharge in the next 24-48 hours pending ongoing reevaluation of her recovery.       Recent Labs  05/27/13 0524 05/28/13 0414  NA 142 145  K 3.5 4.3  CL 106 110  CO2 28 30  GLUCOSE 52* 138*  BUN 22 17  CALCIUM 8.8 8.3*    Recent Labs  05/27/13 0524  WBC 13.3*  HGB 9.0*  HCT 27.3*  PLT 128*   No results found for this basename: INR,  in the last 72 hours   Discharge Instructions:  The patient is discharged to home with extensive instructions on wound care and progressive ambulation.  They are instructed not to drive or perform any heavy lifting until returning to see the physician in his office.  Discharge Diagnosis:  cad  Secondary Diagnosis: Patient Active Problem List   Diagnosis Date Noted  . CAD, multiple vessel 05/18/2013  . Femoral artery occlusion, bilateral 05/18/2013  . Diabetes 1.5, managed as type 2 05/18/2013  . Rheumatoid arthritis 05/18/2013  . Chest pain 09/18/2011  . Peripheral vascular disease 09/18/2011  . Unstable angina 08/23/2011  . Hypertension 08/23/2011  . Hyperlipidemia 08/23/2011   Past Medical History  Diagnosis Date  . Diabetes mellitus     Greater than 20 years  . Hypertension     Greater than 20 years  . Hypercholesteremia   . [name] (peripheral vascular disease)     Stable claudication. Arteriogram 07/2009 no significant aortoiliac disease, has bilateral SFA occlusions with distal reconstitution   . Closed left fibular fracture     Casted   . Lumbar disc disease     S/p surgery x4;  Sciatica   . Anemia   . Blood transfusion   . Transfusion reaction 1960    Convulsions (? seizure)  . H/O  hiatal hernia   . Parastomal hernia of ileal conduit 2009  . Arthritis   . Anxiety   . Depression   . Chest pain     a. Lexiscan Myoview 12/12:  EF 75%, no ischemia or scar;  b.  Echo 12/12:  EF 60-65%, no sig valvular abnormalities   . Shortness of breath     Hx: of with exertion  . Pneumonia   . Neuropathy due to secondary diabetes     Hx: of  . Complication of anesthesia 1998    Unable to arouse fully, respiratory depression, pt. reports that the family was told that she would need a trach., but then she stabilized & didn't need it    Follow-up Information   Follow up with HENDRICKSON,STEVEN C, MD. (OCT14 2014 at 11 AM. Please obtain a chest x-ray at 10 AM and [facility] imaging. [facility] is located in the same office complex.)    Specialty:  Cardiothoracic Surgery   Contact information:   [address] [place] [address] [phone]       Follow up with [name], MD. (2 weeks-please contact the office to arrange this appointment.)    Specialty:  Cardiology   Contact information:   [address] [place] [address] [phone]       Follow up with Gaspar Garbe,  MD. (Call for an appointment regarding further surveillance of HGA1C 7.8)    Specialty:  Internal Medicine   Contact information:   [address] [facility] [place] [address] [phone]     The patient has been discharged on:   1.Beta Blocker:  Yes [  y ]                              No   [   ]                              If No, reason:  2.Ace Inhibitor/ARB: Yes [ y  ]                                     No  [    ]                                     If No, reason:  3.Statin:   Yes [ y  ]                  No  [   ]                  If No, reason:  4.Ecasa:  Yes  [ y  ]                  No   [   ]                  If No, reason:    Discharge medications:   Medication List    STOP taking these medications        aspirin 81 MG tablet  Replaced by:  aspirin 325 MG EC tablet     carvedilol 25 MG tablet  Commonly known as:  COREG     isosorbide mononitrate 30 MG 24 hr tablet  Commonly known as:  IMDUR     nitroGLYCERIN 0.4 MG SL tablet  Commonly known as:  NITROSTAT      TAKE these medications       aspirin 325 MG EC tablet  Take 1 tablet (325 mg total) by mouth daily.     benazepril 20 MG tablet  Commonly known as:  LOTENSIN  Take 20 mg by mouth daily.     folic acid 1 MG tablet  Commonly known as:  FOLVITE  Take 1 mg by mouth daily.     furosemide 20 MG tablet  Commonly known as:  LASIX  Take 1 tablet (20 mg total) by mouth 2 (two) times daily. For 7 days then stop.     glimepiride 4 MG tablet  Commonly known as:  AMARYL  Take 4 mg by mouth 2 (two) times daily.     JANUVIA 100 MG tablet  Generic drug:  sitaGLIPtin  Take 100 mg by mouth daily.     methotrexate 2.5 MG tablet  Commonly known as:  RHEUMATREX  Take 10 mg by mouth once a week. Friday     metoprolol tartrate 25 MG tablet  Commonly known as:  LOPRESSOR  Take 0.5 tablets (12.5 mg total) by mouth 2 (two) times daily.  potassium chloride 10 MEQ tablet  Commonly known as:  K-DUR,KLOR-CON  Take 1 tablet (10 mEq total) by mouth daily. For 7 days then stop.     predniSONE 5 MG tablet  Commonly known as:  DELTASONE  Take 7.5 mg by mouth daily.     simvastatin 80 MG tablet  Commonly known as:  ZOCOR  Take 80 mg by mouth at bedtime.     traMADol 50 MG tablet  Commonly known as:  ULTRAM  Take 1-2 tablets (50-100 mg total) by mouth every 6 (six) hours as needed for pain (pain).         Disposition: Stable  Patient's condition is Good  Gershon Crane, PA-C 05/29/2013  8:06 AM

## 2013-05-29 LAB — GLUCOSE, CAPILLARY: Glucose-Capillary: 113 mg/dL — ABNORMAL HIGH (ref 70–99)

## 2013-05-29 MED ORDER — LISINOPRIL 20 MG PO TABS
20.0000 mg | ORAL_TABLET | Freq: Every day | ORAL | Status: DC
  Administered 2013-05-29: 20 mg via ORAL
  Filled 2013-05-29: qty 1

## 2013-05-29 MED ORDER — TRAMADOL HCL 50 MG PO TABS: 50.0000 mg | ORAL_TABLET | Freq: Four times a day (QID) | ORAL | Status: DC | PRN

## 2013-05-29 MED ORDER — POTASSIUM CHLORIDE CRYS ER 10 MEQ PO TBCR: 10.0000 meq | EXTENDED_RELEASE_TABLET | Freq: Every day | ORAL | Status: DC

## 2013-05-29 MED ORDER — FUROSEMIDE 20 MG PO TABS: 20.0000 mg | ORAL_TABLET | Freq: Two times a day (BID) | ORAL | Status: DC

## 2013-05-29 MED ORDER — METOPROLOL TARTRATE 25 MG PO TABS: 12.5000 mg | ORAL_TABLET | Freq: Two times a day (BID) | ORAL | Status: DC

## 2013-05-29 MED ORDER — ASPIRIN 325 MG PO TBEC: 325.0000 mg | DELAYED_RELEASE_TABLET | Freq: Every day | ORAL | Status: DC

## 2013-05-29 MED ORDER — POTASSIUM CHLORIDE CRYS ER 20 MEQ PO TBCR
20.0000 meq | EXTENDED_RELEASE_TABLET | Freq: Every day | ORAL | Status: DC
  Administered 2013-05-29: 20 meq via ORAL

## 2013-05-29 NOTE — Progress Notes (Signed)
Removed EPW and CT sutures per MD order per hospital protocol. Pacing wires intact upon removal, slight bleeding at the site. Applied benzoin and steri strips to CT suture site. Patient tolerated well, VSS. Patient reminded to remain in bed for 1 hour. Will continue to monitor closely. Lajuana Matte, RN

## 2013-05-29 NOTE — Progress Notes (Signed)
CARDIAC REHAB PHASE I   PRE:  Rate/Rhythm: 89 SR  BP:  Sitting: 154/73     SaO2: 97%  MODE:  Ambulation: 550 ft   POST:  Rate/Rhythm: 94 SR  BP:  Sitting: 169/79     SaO2: 99% RA  11:35am-12:00pm Patient walked at a slow and steady pace with walker.  Patient conversed all throughout ambulation.  Patient stated that she felt fine but she was a little short of breath.  Patient was very happy and upbeat.  Patient was left sitting on the side of the bed eating her lunch with husband and son in the room.   Theresa Duty, [place] 05/29/2013 11:56 AM

## 2013-05-29 NOTE — Progress Notes (Addendum)
      [address]       [place] [address]             [phone]        5 Days Post-Op Procedure(s) (LRB): CORONARY ARTERY BYPASS GRAFTING (CABG) (N/A)  Subjective: Patient is brushing her teeth. She has no complaints and wants to go home.  Objective: Vital signs in last 24 hours: Temp:  [98 F (36.7 C)-98.1 F (36.7 C)] 98 F (36.7 C) (09/20 0546) Pulse Rate:  [65-91] 65 (09/20 0546) Cardiac Rhythm:  [-] Normal sinus rhythm (09/19 2000) Resp:  [16-18] 18 (09/20 0546) BP: (120-145)/(67-76) 137/76 mmHg (09/20 0546) SpO2:  [95 %-98 %] 96 % (09/20 0546) Weight:  [79.1 kg (174 lb 6.1 oz)] 79.1 kg (174 lb 6.1 oz) (09/20 0546)  Pre op weight  77 kg Current Weight  05/29/13 79.1 kg (174 lb 6.1 oz)      Intake/Output from previous day: 09/19 0701 - 09/20 0700 In: 720 [P.O.:720] Out: 1900 [Urine:1900]   Physical Exam:  Cardiovascular: RRR, no murmurs, gallops, or rubs. Pulmonary: Clear to auscultation on the right;diminished at left base no rales, wheezes, or rhonchi. Abdomen: Soft, non tender, bowel sounds present. Extremities: Mild bilateral lower extremity edema L>R. Minor sero sanguinous ooze proximal EVH site. No erythema or purulence. Wounds: Clean and dry.  No erythema or signs of infection.  Lab Results: CBC: Recent Labs  05/27/13 0524  WBC 13.3*  HGB 9.0*  HCT 27.3*  PLT 128*   BMET:  Recent Labs  05/27/13 0524 05/28/13 0414  NA 142 145  K 3.5 4.3  CL 106 110  CO2 28 30  GLUCOSE 52* 138*  BUN 22 17  CREATININE 0.95 0.83  CALCIUM 8.8 8.3*    PT/INR:  Lab Results  Component Value Date   INR 1.41 05/24/2013   INR 0.91 05/19/2013   INR 1.0 05/07/2013   ABG:  INR: Will add last result for INR, ABG once components are confirmed Will add last 4 CBG results once components are confirmed  Assessment/Plan:  1. CV - SR. On Lopressor 12.5 bid and Lisinopril 10 daily. Will increase Lisinopril to 20 daily for better bp control  (was on benazepril 20 daily prior to surgery). 2.  Pulmonary - Encourage incentive spirometer 3. Volume Overload - Continue with daily Lasix 4.  Acute blood loss anemia - Last H and H 9 and 27.3 5.DM-CBGs  163/133/157 . Pre op HGA1C 7.8. Continue Amaryl, Tradjenta. 6.Mild thrombocytopenia-last platelets 128,000 7.Remove EPW and sutures 8.Regarding sero sanguinous ooze from [facility] site, patient instructed to place dry gauze with tape and change daily. Let open to air when stops oozing. 9. Will discharge 2-3 hours after EPW removed  ZIMMERMAN,DONIELLE MPA-C 05/29/2013,7:37 AM  Patient seen and examined, agree with above Home today

## 2013-06-10 DIAGNOSIS — I2510 Atherosclerotic heart disease of native coronary artery without angina pectoris: Secondary | ICD-10-CM

## 2013-06-18 ENCOUNTER — Other Ambulatory Visit: Payer: Self-pay | Admitting: *Deleted

## 2013-06-18 DIAGNOSIS — I2510 Atherosclerotic heart disease of native coronary artery without angina pectoris: Secondary | ICD-10-CM

## 2013-06-22 ENCOUNTER — Encounter: Payer: Self-pay | Admitting: Thoracic Surgery (Cardiothoracic Vascular Surgery)

## 2013-06-22 ENCOUNTER — Ambulatory Visit
Admission: RE | Admit: 2013-06-22 | Discharge: 2013-06-22 | Disposition: A | Discharge status: Home / Self Care | Payer: Medicare Other | Source: Ambulatory Visit | Attending: Thoracic Surgery (Cardiothoracic Vascular Surgery) | Admitting: Thoracic Surgery (Cardiothoracic Vascular Surgery)

## 2013-06-22 ENCOUNTER — Ambulatory Visit: Payer: Medicare Other | Admitting: Thoracic Surgery (Cardiothoracic Vascular Surgery)

## 2013-06-22 ENCOUNTER — Ambulatory Visit (INDEPENDENT_AMBULATORY_CARE_PROVIDER_SITE_OTHER): Payer: Self-pay | Admitting: Thoracic Surgery (Cardiothoracic Vascular Surgery)

## 2013-06-22 VITALS — BP 137/80 | HR 90 | Resp 20 | Ht 66.0 in | Wt 170.0 lb

## 2013-06-22 DIAGNOSIS — Z951 Presence of aortocoronary bypass graft: Secondary | ICD-10-CM

## 2013-06-22 DIAGNOSIS — I2510 Atherosclerotic heart disease of native coronary artery without angina pectoris: Secondary | ICD-10-CM

## 2013-06-22 MED ORDER — FUROSEMIDE 20 MG PO TABS: 40.0000 mg | ORAL_TABLET | Freq: Two times a day (BID) | ORAL | Status: DC

## 2013-06-22 NOTE — Patient Instructions (Signed)
Increase lasix to 2 tablets(40 mg) twice daily until swelling is gone  Do not lift anything over 10 pounds for another 3 weeks  You may drive taking the precautions we discussed

## 2013-06-22 NOTE — Progress Notes (Signed)
HPI:  Patricia Villegas returns today for a scheduled postoperative followup visit.  She is a 78 year old woman who was having unstable angina. She was found to have three-vessel disease. She underwent coronary bypass grafting x4 on September 15. Her postoperative course she was discharged on September 20.  She says that she has some pain just to the left of the sternum in one spot. She has not been taking any narcotics, nor has she tried any nonsteroidals.  She complains of lack of stamina and energy. She also has some swelling in her feet and ankles.  Past Medical History  Diagnosis Date  . Diabetes mellitus     Greater than 20 years  . Hypertension     Greater than 20 years  . Hypercholesteremia   . [name] (peripheral vascular disease)     Stable claudication. Arteriogram 07/2009 no significant aortoiliac disease, has bilateral SFA occlusions with distal reconstitution   . Closed left fibular fracture     Casted   . Lumbar disc disease     S/p surgery x4;  Sciatica   . Anemia   . Blood transfusion   . Transfusion reaction 1960    Convulsions (? seizure)  . H/O hiatal hernia   . Parastomal hernia of ileal conduit 2009  . Arthritis   . Anxiety   . Depression   . Chest pain     a. Lexiscan Myoview 12/12:  EF 75%, no ischemia or scar;  b.  Echo 12/12:  EF 60-65%, no sig valvular abnormalities   . Shortness of breath     Hx: of with exertion  . Pneumonia   . Neuropathy due to secondary diabetes     Hx: of  . Complication of anesthesia 1998    Unable to arouse fully, respiratory depression, pt. reports that the family was told that she would need a trach., but then she stabilized & didn't need it       Current Outpatient Prescriptions  Medication Sig Dispense Refill  . aspirin EC 325 MG EC tablet Take 1 tablet (325 mg total) by mouth daily.  30 tablet  0  . benazepril (LOTENSIN) 20 MG tablet Take 20 mg by mouth daily.       . furosemide (LASIX) 20 MG tablet Take 2 tablets (40  mg total) by mouth 2 (two) times daily. p.  60 tablet  5  . glimepiride (AMARYL) 4 MG tablet Take 4 mg by mouth 2 (two) times daily.      . metoprolol tartrate (LOPRESSOR) 25 MG tablet Take 0.5 tablets (12.5 mg total) by mouth 2 (two) times daily.  30 tablet  1  . simvastatin (ZOCOR) 80 MG tablet Take 80 mg by mouth at bedtime.         No current facility-administered medications for this visit.    Physical Exam BP 137/80  Pulse 90  Resp 20  Ht 5\' 6"  (1.676 m)  Wt 170 lb (77.111 kg)  BMI 27.45 kg/m2  SpO109 80% 78 year old woman in no acute distress General well-developed and well-nourished Neurologic alert and oriented x3 with no focal deficits Cardiac regular rate and rhythm normal S1 and S2 no rubs Lungs diminished breath sounds left base Sternum stable, incision clean dry and intact Leg incisions healing well 1+ edema bilateral feet and ankles  Diagnostic Tests: Chest x-ray 06/22/2013 EXAM:  CHEST 2 VIEW  COMPARISON: Portable chest x-ray of 05/26/2013  FINDINGS:  There is still a small left pleural effusion present with left  basilar atelectasis. The right lung is clear. Mild cardiomegaly is  stable. Median sternotomy sutures are noted from CABG  IMPRESSION:  Persistent small left pleural effusion with left basilar  atelectasis.  Electronically Signed  By: Dwyane Dee M.D.  On: 06/22/2013 08:57  Impression: 78 year old woman now about a month out from coronary bypass grafting x4. Overall she's doing quite well. She should start to notice a significant improvement in her energy levels and stamina over the next 3-4 weeks.  She does have a slightly elevated left hemidiaphragm. This is not uncommon and usually will return to normal, but often takes up to 6-9 months to do so.  She is having some swelling in her feet and ankles. She is back on her baseline dose of 20 mg of Lasix twice daily. I am going to increase that to 40 mg twice daily. Once the swelling in her feet and  ankles has resolved she will go back to 20 mg twice daily. I did put in a new prescription for her, but she can just use 2 of the tablets at a time that she has now until she needs the new prescription.  I do think it is okay for her to begin driving. Appropriate precautions were discussed. She is not to lift anything over 10 pounds for another 3 weeks, and should continue to observe sternal precautions for that time as well.  Again I think she is doing very well overall   Plan: She will followup with Dr. [name].  I will be happy see her back any time if I can be of any further assistance with her care.

## 2013-06-30 ENCOUNTER — Ambulatory Visit (INDEPENDENT_AMBULATORY_CARE_PROVIDER_SITE_OTHER): Payer: Medicare Other | Admitting: Cardiology

## 2013-06-30 ENCOUNTER — Encounter: Payer: Self-pay | Admitting: Cardiology

## 2013-06-30 VITALS — BP 150/80 | HR 78 | Ht 66.0 in | Wt 166.8 lb

## 2013-06-30 DIAGNOSIS — I5032 Chronic diastolic (congestive) heart failure: Secondary | ICD-10-CM

## 2013-06-30 DIAGNOSIS — I739 Peripheral vascular disease, unspecified: Secondary | ICD-10-CM

## 2013-06-30 DIAGNOSIS — R0609 Other forms of dyspnea: Secondary | ICD-10-CM

## 2013-06-30 DIAGNOSIS — I509 Heart failure, unspecified: Secondary | ICD-10-CM

## 2013-06-30 DIAGNOSIS — E785 Hyperlipidemia, unspecified: Secondary | ICD-10-CM

## 2013-06-30 DIAGNOSIS — I2510 Atherosclerotic heart disease of native coronary artery without angina pectoris: Secondary | ICD-10-CM

## 2013-06-30 MED ORDER — FUROSEMIDE 40 MG PO TABS: ORAL_TABLET | ORAL | Status: DC

## 2013-06-30 NOTE — Progress Notes (Signed)
Patient ID: Patricia Villegas, female   DOB: Mar 16, 1935, 78 y.o.   MRN: [id number] PCP: Dr. Wylene Simmer  78 yo with history of HTN, DM, hyperlipidemia, rheumatoid arthritis, PAD, and CAD s/p CABG presents for cardiology followup.  I initially saw her in 12/12.  She was admitted to the hospital at that time with generalized ill feeling, vague chest pain, and fever.  Initial point of care troponin was elevated but three subsequent sets of cardiac enzymes were normal.  She tested positive for Influenza A.  Myoview done that admission showed no evidence for ischemia or infarction.  She was seen again in 9/14 with exertional chest pain radiating to her neck.  LHC was done in 9/14, showing severe three vessel disease.  EF was preserved on echo.  Patient had CABG in 9/14 with Dr. Dorris Fetch.   She is now home.  She has had significant problems lately with joint and low back pain.  She is not walking a lot because of the arthritis.  She uses a walker.  She notes some dyspnea when walking in the house (mild).  Moderate dyspnea when she walks in the driveway.  No chest pain.    ECG: NSR, LAE, left axis deviation, lateral TWIs  Labs (12/12): K 4.2, creatinine 0.9 Labs (9/14): K 4.3, creatinine 0.83  PMH: 1. HTN 2. PAD: Followed by Dr. Edilia Bo.  Arteriogram in 11/10 showed bilateral SFA occlusions with distal reconstitution.  3. Diabetes mellitus 4. Hyperlipidemia 5. L-spine surgery x 4.  Sciatica.  6. Hiatal hernia.  7. CAD: Lexiscan myoview (12/12) with EF 75%, no ischemia or infarction.  Echo (12/12): EF 60-65%, no significant valvular abnormalities.  LHC (9/14) with 80% ostial LAD, 95% ostial LCx, 95% pRCA (nondominant).  Patient had CABG (9/14) with LIMA-LAD, seq SVG-OM1/PLOM, SVG-AM.  8. Rheumatoid arthritis 9. Diastolic CHF: Echo (9/14) with EF 60-65%, mild MR, PA systolic pressure 43 mmHg.   FH: Brother with CABG.  Mother with MI at 28.   SH: Married, 2 children.  Quit smoking in 1998.  Lives in  [place].   ROS: All systems reviewed and negative except as per HPI.   Current Outpatient Prescriptions  Medication Sig Dispense Refill  . aspirin EC 325 MG EC tablet Take 1 tablet (325 mg total) by mouth daily.  30 tablet  0  . benazepril (LOTENSIN) 20 MG tablet Take 20 mg by mouth daily.       Marland Kitchen glimepiride (AMARYL) 4 MG tablet Take 4 mg by mouth 2 (two) times daily.      . methotrexate (RHEUMATREX) 2.5 MG tablet 2.5 mg every 7 (seven) days.      . metoprolol tartrate (LOPRESSOR) 25 MG tablet Take 0.5 tablets (12.5 mg total) by mouth 2 (two) times daily.  30 tablet  1  . simvastatin (ZOCOR) 80 MG tablet Take 80 mg by mouth at bedtime.        . traMADol (ULTRAM) 50 MG tablet as needed.      . furosemide (LASIX) 40 MG tablet 1 tablet in the AM and 1/2 tablet in the PM  45 tablet  3   No current facility-administered medications for this visit.    BP 150/80  Pulse 78  Ht 5\' 6"  (1.676 m)  Wt 75.66 kg (166 lb 12.8 oz)  BMI 26.94 kg/m2 General: NAD Neck: JVP 8-9 cm, no thyromegaly or thyroid nodule.  Lungs: Slight decreased breath sounds left base.  CV: Nondisplaced PMI.  Heart regular S1/S2, no  S3/S4, 1/6 SEM RUSB.  1+ edema to knees bilaterally.  No carotid bruit.  Feet warm but unable to palpate pedal pulses.   Abdomen: Soft, nontender, no hepatosplenomegaly, no distention.  Neurologic: Alert and oriented x 3.  Psych: Normal affect. Extremities: No clubbing or cyanosis.   Assessment/Plan: 1. CAD: s/p CABG.  Continue ASA, ACEI, metoprolol, simvastatin.  - Start cardiac rehab.  2. Hyperlipidemia: Patient is on Zocor 80 mg daily but has been on this statin dose long-term so will continue it.  I will arrange for a lipid check.  3. Chronic diastolic CHF: Patient is volume overloaded on exam with NYHA class III symptoms.   - Increase Lasix to 60 mg bid.  - BMET/BNP today and repeat in 10 days.   - Followup in 2 wks.  - Wear compression stockings.  4. PAD:  Significant PAD, followed by [facility]. She does not report much in the way of claudication at this time.   [name] 06/30/2013

## 2013-06-30 NOTE — Patient Instructions (Addendum)
Increase lasix (furosemide) to 40mg  two times a day for 3 days, then decrease lasix (furosemide) to 40mg  in the AM and 20mg  in the PM. This will be 1 of your 40mg  tablets two times a day for 3 days, then 1 of your 40mg  tablets in the AM and 1/2 of your 40mg  tablets in the PM. Stay on this dose.  Your physician recommends that you have  lab work today--BMET/BNP.  Your physician recommends that you return for a FASTING lipid profile /BMET/BNP in about 10 days.  You have been referred to Cardiac Rehab at [facility].   Your physician recommends that you schedule a follow-up appointment on Monday November 10,2014 at 2:30PM.

## 2013-07-01 ENCOUNTER — Telehealth (HOSPITAL_COMMUNITY): Payer: Self-pay | Admitting: *Deleted

## 2013-07-01 LAB — BASIC METABOLIC PANEL
BUN: 13 mg/dL (ref 6–23)
CO2: 30 mEq/L (ref 19–32)
Calcium: 9.2 mg/dL (ref 8.4–10.5)
Chloride: 100 mEq/L (ref 96–112)
Creatinine, Ser: 0.8 mg/dL (ref 0.4–1.2)
GFR: 78.49 mL/min (ref >60.00)

## 2013-07-01 LAB — BRAIN NATRIURETIC PEPTIDE: Pro B Natriuretic peptide (BNP): 413 pg/mL — ABNORMAL HIGH (ref 0.0–100.0)

## 2013-07-12 ENCOUNTER — Other Ambulatory Visit: Payer: Medicare Other

## 2013-07-13 ENCOUNTER — Encounter: Payer: Self-pay | Admitting: Cardiology

## 2013-07-19 ENCOUNTER — Encounter: Payer: Self-pay | Admitting: Cardiology

## 2013-07-19 ENCOUNTER — Ambulatory Visit (INDEPENDENT_AMBULATORY_CARE_PROVIDER_SITE_OTHER): Payer: Medicare Other | Admitting: Cardiology

## 2013-07-19 ENCOUNTER — Ambulatory Visit
Admission: RE | Admit: 2013-07-19 | Discharge: 2013-07-19 | Disposition: A | Discharge status: Home / Self Care | Payer: Medicare Other | Source: Ambulatory Visit | Attending: Cardiology | Admitting: Cardiology

## 2013-07-19 VITALS — BP 142/76 | HR 88 | Ht 66.0 in | Wt 164.0 lb

## 2013-07-19 DIAGNOSIS — I739 Peripheral vascular disease, unspecified: Secondary | ICD-10-CM

## 2013-07-19 DIAGNOSIS — I5032 Chronic diastolic (congestive) heart failure: Secondary | ICD-10-CM

## 2013-07-19 DIAGNOSIS — I2510 Atherosclerotic heart disease of native coronary artery without angina pectoris: Secondary | ICD-10-CM

## 2013-07-19 DIAGNOSIS — I509 Heart failure, unspecified: Secondary | ICD-10-CM

## 2013-07-19 DIAGNOSIS — J189 Pneumonia, unspecified organism: Secondary | ICD-10-CM

## 2013-07-19 DIAGNOSIS — E785 Hyperlipidemia, unspecified: Secondary | ICD-10-CM

## 2013-07-19 MED ORDER — POTASSIUM CHLORIDE CRYS ER 20 MEQ PO TBCR: 20.0000 meq | EXTENDED_RELEASE_TABLET | Freq: Every day | ORAL | Status: DC

## 2013-07-19 NOTE — Patient Instructions (Signed)
Increase lasix (furosemide) to 80mg  in the AM and 40mg  in the PM. This will be 2 of your 40mg  tablets in the AM and 1 of your 40mg  tablets in the PM.  Start KCL(potassium) 20 mEq daily.   A chest x-ray takes a picture of the organs and structures inside the chest, including the heart, lungs, and blood vessels. This test can show several things, including, whether the heart is enlarges; whether fluid is building up in the lungs; and whether pacemaker / defibrillator leads are still in place.  Your physician recommends that you return for lab work in: 1 week--BMET/BNP.  Your physician recommends that you schedule a follow-up appointment in: 2 weeks with Dr Shirlee Latch.

## 2013-07-19 NOTE — Progress Notes (Signed)
Patient ID: Patricia Villegas, female   DOB: 1934/11/01, 78 y.o.   MRN: [id number] PCP: Dr. Wylene Simmer  78 yo with history of HTN, DM, hyperlipidemia, rheumatoid arthritis, PAD, and CAD s/p CABG presents for cardiology followup.  I initially saw her in 12/12.  She was admitted to the hospital at that time with generalized ill feeling, vague chest pain, and fever.  Initial point of care troponin was elevated but three subsequent sets of cardiac enzymes were normal.  She tested positive for Influenza A.  Myoview done that admission showed no evidence for ischemia or infarction.  She was seen again in 9/14 with exertional chest pain radiating to her neck.  LHC was done in 9/14, showing severe three vessel disease.  EF was preserved on echo.  Patient had CABG in 9/14 with Dr. Dorris Fetch.   At last appointment, I thought she was volume overloaded and asked her to increase Lasix to 60 mg bid.  It appears that she is taking 80 mg once daily currently.  Weight is down 2 lbs.  She is still short of breath walking in her driveway and does not get out much.  She has 3-pillow orthopnea (present since surgery). No PND.  Occasional chest pain only with deep breathing.  She overall does not feel great today.  She thinks that she has caught a URI from her husband who was sick recently.  She is coughing up yellow sputum.  No fever.    ECG: NSR, LAE, left axis deviation, lateral TWIs  Labs (12/12): K 4.2, creatinine 0.9 Labs (9/14): K 4.3, creatinine 0.83 Labs (10/14): K 4.3, creatinine 0.8, BNP 413  PMH: 1. HTN 2. PAD: Followed by Dr. Edilia Bo.  Arteriogram in 11/10 showed bilateral SFA occlusions with distal reconstitution.  3. Diabetes mellitus 4. Hyperlipidemia 5. L-spine surgery x 4.  Sciatica.  6. Hiatal hernia.  7. CAD: Lexiscan myoview (12/12) with EF 75%, no ischemia or infarction.  Echo (12/12): EF 60-65%, no significant valvular abnormalities.  LHC (9/14) with 80% ostial LAD, 95% ostial LCx, 95% pRCA  (nondominant).  Patient had CABG (9/14) with LIMA-LAD, seq SVG-OM1/PLOM, SVG-AM.  8. Rheumatoid arthritis 9. Diastolic CHF: Echo (9/14) with EF 60-65%, mild MR, PA systolic pressure 43 mmHg.   FH: Brother with CABG.  Mother with MI at 18.   SH: Married, 2 children.  Quit smoking in 1998.  Lives in [place].   ROS: All systems reviewed and negative except as per HPI.   Current Outpatient Prescriptions  Medication Sig Dispense Refill  . aspirin EC 325 MG EC tablet Take 1 tablet (325 mg total) by mouth daily.  30 tablet  0  . benazepril (LOTENSIN) 20 MG tablet Take 20 mg by mouth daily.       . furosemide (LASIX) 40 MG tablet 2 tablets (total 80mg ) in the AM and 1 tablet (total 40mg ) in the PM  90 tablet  3  . glimepiride (AMARYL) 4 MG tablet Take 4 mg by mouth 2 (two) times daily.      . methotrexate (RHEUMATREX) 2.5 MG tablet 2.5 mg every 7 (seven) days.      . metoprolol tartrate (LOPRESSOR) 25 MG tablet Take 0.5 tablets (12.5 mg total) by mouth 2 (two) times daily.  30 tablet  1  . ONE TOUCH ULTRA TEST test strip       . potassium chloride SA (K-DUR,KLOR-CON) 20 MEQ tablet Take 1 tablet (20 mEq total) by mouth daily.  30 tablet  3  . simvastatin (ZOCOR) 80 MG tablet Take 80 mg by mouth at bedtime.        . traMADol (ULTRAM) 50 MG tablet as needed.       No current facility-administered medications for this visit.    BP 142/76  Pulse 88  Ht 5\' 6"  (1.676 m)  Wt 74.39 kg (164 lb)  BMI 26.48 kg/m2 General: NAD Neck: JVP 8-9 cm, no thyromegaly or thyroid nodule.  Lungs: Slight decreased breath sounds left base.  CV: Nondisplaced PMI.  Heart regular S1/S2, +S4, 1/6 SEM RUSB.  1+ edema 1/3 to knees bilaterally.  No carotid bruit.  Feet warm but unable to palpate pedal pulses.   Abdomen: Soft, nontender, no hepatosplenomegaly, no distention.  Neurologic: Alert and oriented x 3.  Psych: Normal affect. Extremities: No clubbing or cyanosis.   Assessment/Plan: 1.  CAD: s/p CABG.  Continue ASA, ACEI, metoprolol, simvastatin.   2. Hyperlipidemia: Patient is on Zocor 80 mg daily but has been on this statin dose long-term so will continue it.  She had lipids recently with PCP, I will try to get a copy of the labs.   3. Chronic diastolic CHF: Patient is still volume overloaded on exam with NYHA class III symptoms.   - Increase Lasix to 80 mg qam, 40 mg qpm along with KCl 20 mEq daily.   - BMET/BNP in 10 days.   - Followup in 2 wks.  - CXR given cough and decreased breath sounds left base (rule out PNA).  4. PAD: Significant PAD, followed by [facility]. She does not report much in the way of claudication at this time.   [name] 07/19/2013

## 2013-07-26 ENCOUNTER — Other Ambulatory Visit (INDEPENDENT_AMBULATORY_CARE_PROVIDER_SITE_OTHER): Payer: Medicare Other

## 2013-07-26 DIAGNOSIS — I5032 Chronic diastolic (congestive) heart failure: Secondary | ICD-10-CM

## 2013-07-26 DIAGNOSIS — I2510 Atherosclerotic heart disease of native coronary artery without angina pectoris: Secondary | ICD-10-CM

## 2013-07-26 DIAGNOSIS — J189 Pneumonia, unspecified organism: Secondary | ICD-10-CM

## 2013-07-26 DIAGNOSIS — R0609 Other forms of dyspnea: Secondary | ICD-10-CM

## 2013-07-26 LAB — LIPID PANEL
Cholesterol: 105 mg/dL (ref 0–200)
LDL Cholesterol: 50 mg/dL (ref 0–99)
VLDL: 21.2 mg/dL (ref 0.0–40.0)

## 2013-07-26 LAB — BASIC METABOLIC PANEL
Chloride: 99 mEq/L (ref 96–112)
GFR: 71.88 mL/min (ref >60.00)
Glucose, Bld: 143 mg/dL — ABNORMAL HIGH (ref 70–99)
Potassium: 3.7 mEq/L (ref 3.5–5.1)
Sodium: 137 mEq/L (ref 135–145)

## 2013-07-26 LAB — BRAIN NATRIURETIC PEPTIDE: Pro B Natriuretic peptide (BNP): 130 pg/mL — ABNORMAL HIGH (ref 0.0–100.0)

## 2013-08-02 ENCOUNTER — Ambulatory Visit (INDEPENDENT_AMBULATORY_CARE_PROVIDER_SITE_OTHER): Payer: Medicare Other | Admitting: Cardiology

## 2013-08-02 ENCOUNTER — Encounter: Payer: Self-pay | Admitting: Cardiology

## 2013-08-02 VITALS — BP 165/86 | HR 96 | Ht 66.0 in | Wt 158.8 lb

## 2013-08-02 DIAGNOSIS — E785 Hyperlipidemia, unspecified: Secondary | ICD-10-CM

## 2013-08-02 DIAGNOSIS — I10 Essential (primary) hypertension: Secondary | ICD-10-CM

## 2013-08-02 DIAGNOSIS — I2510 Atherosclerotic heart disease of native coronary artery without angina pectoris: Secondary | ICD-10-CM

## 2013-08-02 DIAGNOSIS — I509 Heart failure, unspecified: Secondary | ICD-10-CM

## 2013-08-02 DIAGNOSIS — I5032 Chronic diastolic (congestive) heart failure: Secondary | ICD-10-CM

## 2013-08-02 MED ORDER — BENAZEPRIL HCL 20 MG PO TABS: 40.0000 mg | ORAL_TABLET | Freq: Every day | ORAL | Status: DC

## 2013-08-02 NOTE — Progress Notes (Signed)
Patient ID: Patricia Villegas, female   DOB: 12-02-1934, 78 y.o.   MRN: [id number] PCP: Dr. Wylene Villegas  78 yo with history of HTN, DM, hyperlipidemia, rheumatoid arthritis, PAD, and CAD s/p CABG presents for cardiology followup.  I initially saw her in 12/12.  She was admitted to the hospital at that time with generalized ill feeling, vague chest pain, and fever.  Initial point of care troponin was elevated but three subsequent sets of cardiac enzymes were normal.  She tested positive for Influenza A.  Myoview done that admission showed no evidence for ischemia or infarction.  She was seen again in 9/14 with exertional chest pain radiating to her neck.  LHC was done in 9/14, showing severe three vessel disease.  EF was preserved on echo.  Patient had CABG in 9/14 with Dr. Dorris Villegas.  She had trouble with volume overload after discharge.  At last appointment, I increased her Lasix.   Since last appointment, she has lost 6 lbs.  She is breathing better: no dyspnea walking around her house.  She can climb a flight of steps with mild dyspnea.  Her arthritis is flaring, which makes it difficult for her to exercise.  She is back on prednisone.  She is not going to start cardiac rehab until arthritis is better.  No chest pain.  BP is high in the office today.  It runs 140s-150s at home generally.    Labs (12/12): K 4.2, creatinine 0.9 Labs (9/14): K 4.3, creatinine 0.83 Labs (10/14): K 4.3, creatinine 0.8, BNP 413 Labs (11/14): LDL 50, HDL 34, K 3.7, creatinine 0.8, BNP 130  PMH: 1. HTN 2. PAD: Followed by Dr. Edilia Villegas.  Arteriogram in 11/10 showed bilateral SFA occlusions with distal reconstitution.  3. Diabetes mellitus 4. Hyperlipidemia 5. L-spine surgery x 4.  Sciatica.  6. Hiatal hernia.  7. CAD: Lexiscan myoview (12/12) with EF 75%, no ischemia or infarction.  Echo (12/12): EF 60-65%, no significant valvular abnormalities.  LHC (9/14) with 80% ostial LAD, 95% ostial LCx, 95% pRCA (nondominant).  Patient  had CABG (9/14) with LIMA-LAD, seq SVG-OM1/PLOM, SVG-AM.  8. Rheumatoid arthritis 9. Diastolic CHF: Echo (9/14) with EF 60-65%, mild MR, PA systolic pressure 43 mmHg.   FH: Brother with CABG.  Mother with MI at 8.   SH: Married, 2 children.  Quit smoking in 1998.  Lives in [place].   ROS: All systems reviewed and negative except as per HPI.   Current Outpatient Prescriptions  Medication Sig Dispense Refill  . aspirin EC 325 MG EC tablet Take 1 tablet (325 mg total) by mouth daily.  30 tablet  0  . benazepril (LOTENSIN) 20 MG tablet Take 2 tablets (40 mg total) by mouth daily.  60 tablet  6  . furosemide (LASIX) 40 MG tablet 2 tablets (total 80mg ) in the AM and 1 tablet (total 40mg ) in the PM  90 tablet  3  . glimepiride (AMARYL) 4 MG tablet Take 4 mg by mouth 2 (two) times daily.      . methotrexate (RHEUMATREX) 2.5 MG tablet 2.5 mg every 7 (seven) days.      . ONE TOUCH ULTRA TEST test strip       . potassium chloride SA (K-DUR,KLOR-CON) 20 MEQ tablet Take 1 tablet (20 mEq total) by mouth daily.  30 tablet  3  . predniSONE (DELTASONE) 5 MG tablet 5 mg daily.      . simvastatin (ZOCOR) 80 MG tablet Take 80 mg by mouth  at bedtime.        . traMADol (ULTRAM) 50 MG tablet as needed.      . metoprolol tartrate (LOPRESSOR) 25 MG tablet Take 0.5 tablets (12.5 mg total) by mouth 2 (two) times daily.  30 tablet  1   No current facility-administered medications for this visit.    BP 165/86  Pulse 96  Ht 5\' 6"  (1.676 m)  Wt 72.031 kg (158 lb 12.8 oz)  BMI 25.64 kg/m2 General: NAD Neck: JVP 7 cm, no thyromegaly or thyroid nodule.  Lungs: Slight decreased breath sounds left base.  CV: Nondisplaced PMI.  Heart regular S1/S2, +S4, 1/6 SEM RUSB.  No edema.  No carotid bruit.  Feet warm but unable to palpate pedal pulses.   Abdomen: Soft, nontender, no hepatosplenomegaly, no distention.  Neurologic: Alert and oriented x 3.  Psych: Normal affect. Extremities: No clubbing  or cyanosis.   Assessment/Plan: 1. CAD: s/p CABG.  Continue ASA, ACEI, metoprolol, simvastatin.   2. Hyperlipidemia: Patient is on Zocor 80 mg daily but has been on this statin dose long-term so will continue it.  Good lipids in 11/14.   3. Chronic diastolic CHF: Volume status improved on higher Lasix dose.  She does not appear significantly volume overloaded and weight is down.  NYHA class II-III symptoms.  Continue current Laisx.  4. PAD: Significant PAD, followed by [facility]. She does not report much in the way of claudication at this time.  5. HTN: BP is elevated.  Increase benazepril to 40 mg daily with BMET and BP check in 2 wks.   [name] 08/02/2013

## 2013-08-02 NOTE — Patient Instructions (Addendum)
Increase benazepril to 40mg  daily. This will be 2 of your 20mg  tablets daily at the same time.   Your physician recommends that you return for lab work in: 2 weeks--BMET.  Your physician has requested that you regularly monitor and record your blood pressure readings at home. Please use the same machine at the same time of day to check your readings and record them. I will call you in about 2 weeks to get the readings. Patricia Villegas  Your physician recommends that you schedule a follow-up appointment in: 2 months with Dr Shirlee Latch.

## 2013-08-04 ENCOUNTER — Other Ambulatory Visit: Payer: Self-pay | Admitting: Physician Assistant

## 2013-08-06 ENCOUNTER — Other Ambulatory Visit: Payer: Self-pay

## 2013-08-06 MED ORDER — METOPROLOL TARTRATE 25 MG PO TABS: 12.5000 mg | ORAL_TABLET | Freq: Two times a day (BID) | ORAL | Status: DC

## 2013-08-16 ENCOUNTER — Other Ambulatory Visit: Payer: Medicare Other

## 2013-08-16 ENCOUNTER — Other Ambulatory Visit: Payer: Self-pay

## 2013-08-16 MED ORDER — METOPROLOL TARTRATE 25 MG PO TABS: 12.5000 mg | ORAL_TABLET | Freq: Two times a day (BID) | ORAL | Status: DC

## 2013-08-17 ENCOUNTER — Other Ambulatory Visit (INDEPENDENT_AMBULATORY_CARE_PROVIDER_SITE_OTHER): Payer: Medicare Other

## 2013-08-17 DIAGNOSIS — I5032 Chronic diastolic (congestive) heart failure: Secondary | ICD-10-CM

## 2013-08-17 LAB — BASIC METABOLIC PANEL
CO2: 26 mEq/L (ref 19–32)
Chloride: 100 mEq/L (ref 96–112)
Potassium: 3.9 mEq/L (ref 3.5–5.1)
Sodium: 135 mEq/L (ref 135–145)

## 2013-08-19 ENCOUNTER — Telehealth: Payer: Self-pay | Admitting: *Deleted

## 2013-08-19 NOTE — Telephone Encounter (Signed)
HTN: BP is elevated. Increase benazepril to 40 mg daily with BMET and BP check in 2 wks.    08/02/13 BP since 129/74 138/84 127/75 127/75 148/86 114/72 110/77 110/77 128/78 144/85 139/79 131/80 147/74 134/89--I will forward to Dr Shirlee Latch for review.

## 2013-08-25 NOTE — Telephone Encounter (Signed)
BP ok, no changes.  

## 2013-08-25 NOTE — Telephone Encounter (Signed)
These are BP readings since 08/02/13 office visit with Dr Shirlee Latch after benazepril was increased to 40mg  daily.

## 2013-09-14 NOTE — Telephone Encounter (Signed)
Pt.notified

## 2013-10-04 ENCOUNTER — Ambulatory Visit (INDEPENDENT_AMBULATORY_CARE_PROVIDER_SITE_OTHER): Payer: Medicare Other | Admitting: Cardiology

## 2013-10-04 ENCOUNTER — Encounter: Payer: Self-pay | Admitting: Cardiology

## 2013-10-04 VITALS — BP 132/62 | HR 60 | Ht 66.0 in | Wt 158.0 lb

## 2013-10-04 DIAGNOSIS — I2510 Atherosclerotic heart disease of native coronary artery without angina pectoris: Secondary | ICD-10-CM

## 2013-10-04 DIAGNOSIS — I509 Heart failure, unspecified: Secondary | ICD-10-CM

## 2013-10-04 DIAGNOSIS — J189 Pneumonia, unspecified organism: Secondary | ICD-10-CM

## 2013-10-04 DIAGNOSIS — I5032 Chronic diastolic (congestive) heart failure: Secondary | ICD-10-CM

## 2013-10-04 DIAGNOSIS — E785 Hyperlipidemia, unspecified: Secondary | ICD-10-CM

## 2013-10-04 DIAGNOSIS — I10 Essential (primary) hypertension: Secondary | ICD-10-CM

## 2013-10-04 NOTE — Patient Instructions (Signed)
Your physician wants you to follow-up in: 6 months with Dr [name]. (July 2015).  You will receive a reminder letter in the mail two months in advance. If you don't receive a letter, please call our office to schedule the follow-up appointment.  

## 2013-10-05 NOTE — Progress Notes (Signed)
Patient ID: Patricia Villegas, female   DOB: Sep 06, 1935, 79 y.o.   MRN: [id number] PCP: Dr. Osborne Casco  79 yo with history of HTN, DM, hyperlipidemia, rheumatoid arthritis, PAD, and CAD s/p CABG presents for cardiology followup.  I initially saw her in 12/12.  She was admitted to the hospital at that time with generalized ill feeling, vague chest pain, and fever.  Initial point of care troponin was elevated but three subsequent sets of cardiac enzymes were normal.  She tested positive for Influenza A.  Myoview done that admission showed no evidence for ischemia or infarction.  She was seen again in 9/14 with exertional chest pain radiating to her neck.  LHC was done in 9/14, showing severe three vessel disease.  EF was preserved on echo.  Patient had CABG in 9/14 with Dr. Roxan Hockey.  She had trouble with volume overload after discharge and was diuresed with Lasix.    She has been doing well recently.  She has cut back her Lasix to 40 mg just once daily. Weight has remained stable.  No edema. Joint pain is doing better.  She walks her driveway twice a day with no dyspnea.  She does not want to do cardiac rehab.  No chest pain.      Labs (12/12): K 4.2, creatinine 0.9 Labs (9/14): K 4.3, creatinine 0.83 Labs (10/14): K 4.3, creatinine 0.8, BNP 413 Labs (11/14): LDL 50, HDL 34, K 3.7, creatinine 0.8, BNP 130 Labs (12/14): K 3.9, creatinine 0.9  PMH: 1. HTN 2. PAD: Followed by Dr. Scot Dock.  Arteriogram in 11/10 showed bilateral SFA occlusions with distal reconstitution.  3. Diabetes mellitus 4. Hyperlipidemia 5. L-spine surgery x 4.  Sciatica.  6. Hiatal hernia.  7. CAD: Lexiscan myoview (12/12) with EF 75%, no ischemia or infarction.  Echo (12/12): EF 60-65%, no significant valvular abnormalities.  LHC (9/14) with 80% ostial LAD, 95% ostial LCx, 95% pRCA (nondominant).  Patient had CABG (9/14) with LIMA-LAD, seq SVG-OM1/PLOM, SVG-AM.  8. Rheumatoid arthritis 9. Diastolic CHF: Echo (3/08) with EF 60-65%,  mild MR, PA systolic pressure 43 mmHg.   FH: Brother with CABG.  Mother with MI at 23.   SH: Married, 2 children.  Quit smoking in 1998.  Lives in [place].   Current Outpatient Prescriptions  Medication Sig Dispense Refill  . aspirin EC 325 MG EC tablet Take 1 tablet (325 mg total) by mouth daily.  30 tablet  0  . benazepril (LOTENSIN) 20 MG tablet Take 2 tablets (40 mg total) by mouth daily.  60 tablet  6  . folic acid (FOLVITE) 1 MG tablet Take 1 mg by mouth daily.      . furosemide (LASIX) 40 MG tablet 1 tablet (40 mg total) daily.      Marland Kitchen glimepiride (AMARYL) 4 MG tablet Take 4 mg by mouth 2 (two) times daily.      . methotrexate (RHEUMATREX) 2.5 MG tablet every 7 (seven) days. 8 TABLETS EVERY 7 DAYS      . metoprolol tartrate (LOPRESSOR) 25 MG tablet Take 0.5 tablets (12.5 mg total) by mouth 2 (two) times daily.  90 tablet  1  . ONE TOUCH ULTRA TEST test strip       . simvastatin (ZOCOR) 80 MG tablet Take 80 mg by mouth at bedtime.         No current facility-administered medications for this visit.    BP 132/62  Pulse 60  Ht 5\' 6"  (1.676 m)  Wt  71.668 kg (158 lb)  BMI 25.51 kg/m2 General: NAD Neck: JVP 7 cm, no thyromegaly or thyroid nodule.  Lungs: Slight decreased breath sounds left base.  CV: Nondisplaced PMI.  Heart regular S1/S2, +S4, 1/6 SEM RUSB.  No edema.  No carotid bruit.  Feet warm but unable to palpate pedal pulses.   Abdomen: Soft, nontender, no hepatosplenomegaly, no distention.  Neurologic: Alert and oriented x 3.  Psych: Normal affect. Extremities: No clubbing or cyanosis.   Assessment/Plan: 1. CAD: s/p CABG.  Continue ASA, ACEI, metoprolol, simvastatin.   2. Hyperlipidemia: Patient is on Zocor 80 mg daily but has been on this statin dose long-term so will continue it.  Good lipids in 11/14.   3. Chronic diastolic CHF: NYHA class II symptoms.  She looks euvolemic today.  She has cut back her Lasix to 40 mg once daily.  I will have her  continue this dose.  She can stop KCl.  4. PAD: Significant PAD, followed by [facility]. She does not report much in the way of claudication at this time.  5. HTN: BP is reasonably controlled.    Loralie Champagne 10/05/2013

## 2013-11-18 ENCOUNTER — Other Ambulatory Visit: Payer: Self-pay | Admitting: Cardiology

## 2014-02-15 ENCOUNTER — Other Ambulatory Visit: Payer: Self-pay | Admitting: Cardiology

## 2014-03-08 ENCOUNTER — Other Ambulatory Visit: Payer: Self-pay | Admitting: *Deleted

## 2014-03-08 ENCOUNTER — Other Ambulatory Visit: Payer: Self-pay | Admitting: Cardiology

## 2014-04-29 ENCOUNTER — Encounter: Payer: Self-pay | Admitting: Cardiology

## 2014-04-29 ENCOUNTER — Ambulatory Visit (INDEPENDENT_AMBULATORY_CARE_PROVIDER_SITE_OTHER): Payer: Medicare Other | Admitting: Cardiology

## 2014-04-29 VITALS — BP 138/80 | HR 56 | Ht 66.0 in | Wt 166.0 lb

## 2014-04-29 DIAGNOSIS — E785 Hyperlipidemia, unspecified: Secondary | ICD-10-CM

## 2014-04-29 DIAGNOSIS — I10 Essential (primary) hypertension: Secondary | ICD-10-CM

## 2014-04-29 DIAGNOSIS — I509 Heart failure, unspecified: Secondary | ICD-10-CM

## 2014-04-29 DIAGNOSIS — I5032 Chronic diastolic (congestive) heart failure: Secondary | ICD-10-CM

## 2014-04-29 DIAGNOSIS — I739 Peripheral vascular disease, unspecified: Secondary | ICD-10-CM

## 2014-04-29 DIAGNOSIS — I2510 Atherosclerotic heart disease of native coronary artery without angina pectoris: Secondary | ICD-10-CM

## 2014-04-29 MED ORDER — POTASSIUM CHLORIDE ER 10 MEQ PO TBCR: 10.0000 meq | EXTENDED_RELEASE_TABLET | Freq: Every day | ORAL | Status: DC

## 2014-04-29 MED ORDER — FUROSEMIDE 40 MG PO TABS: ORAL_TABLET | ORAL | Status: DC

## 2014-04-29 NOTE — Patient Instructions (Signed)
Increase lasix (furosemide) to 40mg  in the AM and 20mg  about 4PM. This will be 1 of your 40mg  tablets in the AM and 1/2 of your 40mg  tablets about 4PM.  Start KCL (Potassium) 10 mEq daily.   Your physician recommends that you return for a FASTING lipid profile /BMET/BNP in 2 weeks.   Your physician recommends that you schedule a follow-up appointment in: 3 months with Dr Aundra Dubin.

## 2014-04-30 NOTE — Progress Notes (Signed)
Patient ID: Patricia Villegas, female   DOB: [birth year], 79 y.o.   MRN: [id number] PCP: Dr. Osborne Casco  79 yo with history of HTN, DM, hyperlipidemia, rheumatoid arthritis, PAD, and CAD s/p CABG presents for cardiology followup.  I initially saw her in 12/12.  She was admitted to the hospital at that time with generalized ill feeling, vague chest pain, and fever.  Initial point of care troponin was elevated but three subsequent sets of cardiac enzymes were normal.  She tested positive for Influenza A.  Myoview done that admission showed no evidence for ischemia or infarction.  She was seen again in 9/14 with exertional chest pain radiating to her neck.  LHC was done in 9/14, showing severe three vessel disease.  EF was preserved on echo.  Patient had CABG in 9/14 with Dr. Roxan Hockey.  She had trouble with volume overload after discharge and was diuresed with Lasix.    Recently, she has gained weight (up 8 lbs on our scales).  She has been using prednisone for rheumatoid arthritis.  She has not been able to walk much because of the arthritis, especially left hip pain.  No chest pain.  She has dyspnea after walking about 100 yards.  No orthopnea or PND.     Labs (12/12): K 4.2, creatinine 0.9 Labs (9/14): K 4.3, creatinine 0.83 Labs (10/14): K 4.3, creatinine 0.8, BNP 413 Labs (11/14): LDL 50, HDL 34, K 3.7, creatinine 0.8, BNP 130 Labs (12/14): K 3.9, creatinine 0.9 Labs (1/15): K 4.1, creatinine 0.85 Labs (8/15): K 4.3, creatinine 0.89, hgb 11.5  ECG: NSR, old ASMI, right superior axis  PMH: 1. HTN 2. PAD: Followed by Dr. Scot Dock.  Arteriogram in 11/10 showed bilateral SFA occlusions with distal reconstitution.  3. Diabetes mellitus 4. Hyperlipidemia 5. L-spine surgery x 4.  Sciatica.  6. Hiatal hernia.  7. CAD: Lexiscan myoview (12/12) with EF 75%, no ischemia or infarction.  Echo (12/12): EF 60-65%, no significant valvular abnormalities.  LHC (9/14) with 80% ostial LAD, 95% ostial LCx, 95% pRCA  (nondominant).  Patient had CABG (9/14) with LIMA-LAD, seq SVG-OM1/PLOM, SVG-AM.  8. Rheumatoid arthritis 9. Diastolic CHF: Echo (3/32) with EF 60-65%, mild MR, PA systolic pressure 43 mmHg.   FH: Brother with CABG.  Mother with MI at 22.   SH: Married, 2 children.  Quit smoking in 1998.  Lives in [place].   ROS: All systems reviewed and negative except as per HPI.   Current Outpatient Prescriptions  Medication Sig Dispense Refill  . aspirin EC 325 MG EC tablet Take 1 tablet (325 mg total) by mouth daily.  30 tablet  0  . benazepril (LOTENSIN) 20 MG tablet Take 20 mg by mouth daily.      . folic acid (FOLVITE) 1 MG tablet Take 1 mg by mouth daily.      Marland Kitchen glimepiride (AMARYL) 4 MG tablet Take 4 mg by mouth 2 (two) times daily.      . methotrexate (RHEUMATREX) 2.5 MG tablet 10 TABLETS EVERY 7 DAYS      . metoprolol tartrate (LOPRESSOR) 25 MG tablet TAKE 1/2 TABLET BY MOUTH 2 TIMES A DAY.  90 tablet  0  . ONE TOUCH ULTRA TEST test strip       . simvastatin (ZOCOR) 80 MG tablet Take 80 mg by mouth at bedtime.        . furosemide (LASIX) 40 MG tablet 1 tablet (40mg ) in the AM and 1/2 tablet (20mg ) about 4PM.  45 tablet  4  . potassium chloride (KLOR-CON 10) 10 MEQ tablet Take 1 tablet (10 mEq total) by mouth daily.  30 tablet  4  . predniSONE (DELTASONE) 5 MG tablet Take 1 tablet by mouth daily.       No current facility-administered medications for this visit.    BP 138/80  Pulse 56  Ht 5\' 6"  (1.676 m)  Wt 75.297 kg (166 lb)  BMI 26.81 kg/m2 General: NAD Neck: JVP 8 cm with HJR, no thyromegaly or thyroid nodule.  Lungs: Slight decreased breath sounds left base.  CV: Nondisplaced PMI.  Heart regular S1/S2, +S4, 1/6 SEM RUSB.  1+ ankle edema bilaterally.  No carotid bruit.  Feet warm but unable to palpate pedal pulses.   Abdomen: Soft, nontender, no hepatosplenomegaly, no distention.  Neurologic: Alert and oriented x 3.  Psych: Normal affect. Extremities: No  clubbing or cyanosis.   Assessment/Plan: 1. CAD: s/p CABG.  Continue ASA, ACEI, metoprolol, simvastatin.   2. Hyperlipidemia: Patient is on Zocor 80 mg daily but has been on this statin dose long-term so will continue it.  Check lipids.   3. Chronic diastolic CHF: NYHA class II-III symptoms.  She has gained weight and looks volume overloaded on exam today.  She has been on prednisone.  I will increase Lasix to 40 qam, 20 qpm and will add KCl 10 daily.  BMET/BNP in 2 wks.  4. PAD: Significant PAD, followed by [facility]. She does not report much in the way of claudication at this time.  5. HTN: BP is reasonably controlled.    Loralie Champagne 04/30/2014

## 2014-05-13 ENCOUNTER — Other Ambulatory Visit (INDEPENDENT_AMBULATORY_CARE_PROVIDER_SITE_OTHER): Payer: Medicare Other

## 2014-05-13 DIAGNOSIS — I10 Essential (primary) hypertension: Secondary | ICD-10-CM

## 2014-05-13 DIAGNOSIS — I509 Heart failure, unspecified: Secondary | ICD-10-CM

## 2014-05-13 DIAGNOSIS — I5032 Chronic diastolic (congestive) heart failure: Secondary | ICD-10-CM

## 2014-05-13 LAB — BASIC METABOLIC PANEL
BUN: 15 mg/dL (ref 6–23)
CALCIUM: 9 mg/dL (ref 8.4–10.5)
CO2: 28 mEq/L (ref 19–32)
CREATININE: 0.9 mg/dL (ref 0.4–1.2)
Chloride: 105 mEq/L (ref 96–112)
GFR: 61.27 mL/min (ref >60.00)
Glucose, Bld: 90 mg/dL (ref 70–99)
Potassium: 3.5 mEq/L (ref 3.5–5.1)
SODIUM: 140 meq/L (ref 135–145)

## 2014-05-13 LAB — BRAIN NATRIURETIC PEPTIDE: Pro B Natriuretic peptide (BNP): 142 pg/mL — ABNORMAL HIGH (ref 0.0–100.0)

## 2014-05-13 LAB — LIPID PANEL
CHOLESTEROL: 166 mg/dL (ref 0–200)
HDL: 52.5 mg/dL (ref >39.00)
LDL Cholesterol: 86 mg/dL (ref 0–99)
NonHDL: 113.5
Total CHOL/HDL Ratio: 3
Triglycerides: 140 mg/dL (ref 0.0–149.0)
VLDL: 28 mg/dL (ref 0.0–40.0)

## 2014-05-17 ENCOUNTER — Other Ambulatory Visit: Payer: Self-pay | Admitting: Cardiology

## 2014-08-11 ENCOUNTER — Encounter: Payer: Self-pay | Admitting: Cardiology

## 2014-08-11 ENCOUNTER — Ambulatory Visit (INDEPENDENT_AMBULATORY_CARE_PROVIDER_SITE_OTHER): Payer: Medicare Other | Admitting: Cardiology

## 2014-08-11 VITALS — BP 137/78 | HR 66 | Ht 66.0 in | Wt 166.0 lb

## 2014-08-11 DIAGNOSIS — I739 Peripheral vascular disease, unspecified: Secondary | ICD-10-CM

## 2014-08-11 DIAGNOSIS — I5032 Chronic diastolic (congestive) heart failure: Secondary | ICD-10-CM

## 2014-08-11 DIAGNOSIS — I2510 Atherosclerotic heart disease of native coronary artery without angina pectoris: Secondary | ICD-10-CM

## 2014-08-11 NOTE — Patient Instructions (Signed)
Decrease aspirin to 162mg  daily. This will be 2 of 81mg  tablets daily.  Your physician wants you to follow-up in: 6 months with Dr Aundra Dubin. (June 2016). You will receive a reminder letter in the mail two months in advance. If you don't receive a letter, please call our office to schedule the follow-up appointment.

## 2014-08-12 NOTE — Progress Notes (Signed)
Patient ID: Patricia Villegas, female   DOB: [birth year], 79 y.o.   MRN: [id number] PCP: Dr. Osborne Casco  79 yo with history of HTN, DM, hyperlipidemia, rheumatoid arthritis, PAD, and CAD s/p CABG presents for cardiology followup.  I initially saw her in 12/12.  She was admitted to the hospital at that time with generalized ill feeling, vague chest pain, and fever.  Initial point of care troponin was elevated but three subsequent sets of cardiac enzymes were normal.  She tested positive for Influenza A.  Myoview done that admission showed no evidence for ischemia or infarction.  She was seen again in 9/14 with exertional chest pain radiating to her neck.  LHC was done in 9/14, showing severe three vessel disease.  EF was preserved on echo.  Patient had CABG in 9/14 with Dr. Roxan Hockey.  She had trouble with volume overload after discharge and was diuresed with Lasix.    She has been doing well recently.  No exertional chest pain or dyspnea.  Only complaint at this time is left hip pain. She may need THR in the future.  She also bruises easily.     Labs (12/12): K 4.2, creatinine 0.9 Labs (9/14): K 4.3, creatinine 0.83 Labs (10/14): K 4.3, creatinine 0.8, BNP 413 Labs (11/14): LDL 50, HDL 34, K 3.7, creatinine 0.8, BNP 130 Labs (12/14): K 3.9, creatinine 0.9 Labs (1/15): K 4.1, creatinine 0.85 Labs (8/15): K 4.3, creatinine 0.89, hgb 11.5 Labs (9/15): LDL 86 HDL 53, K 3.5, creatinine 0.9, BNP 142  ECG: NSR, old ASMI, LAFB  PMH: 1. HTN 2. PAD: Followed by Dr. Scot Dock.  Arteriogram in 11/10 showed bilateral SFA occlusions with distal reconstitution.  3. Diabetes mellitus 4. Hyperlipidemia 5. L-spine surgery x 4.  Sciatica.  6. Hiatal hernia.  7. CAD: Lexiscan myoview (12/12) with EF 75%, no ischemia or infarction.  Echo (12/12): EF 60-65%, no significant valvular abnormalities.  LHC (9/14) with 80% ostial LAD, 95% ostial LCx, 95% pRCA (nondominant).  Patient had CABG (9/14) with LIMA-LAD, seq  SVG-OM1/PLOM, SVG-AM.  8. Rheumatoid arthritis 9. Diastolic CHF: Echo (4/85) with EF 60-65%, mild MR, PA systolic pressure 43 mmHg.   FH: Brother with CABG.  Mother with MI at 15.   SH: Married, 2 children.  Quit smoking in 1998.  Lives in [place].   ROS: All systems reviewed and negative except as per HPI.   Current Outpatient Prescriptions  Medication Sig Dispense Refill  . Alogliptin Benzoate (NESINA) 25 MG TABS Take 25 mg by mouth daily.    . benazepril (LOTENSIN) 20 MG tablet Take 20 mg by mouth daily.    . folic acid (FOLVITE) 1 MG tablet Take 1 mg by mouth daily.    . furosemide (LASIX) 40 MG tablet 1 tablet (40mg ) in the AM and 1/2 tablet (20mg ) about 4PM. 45 tablet 4  . glimepiride (AMARYL) 4 MG tablet Take 4 mg by mouth 2 (two) times daily.    . methotrexate (RHEUMATREX) 2.5 MG tablet 10 TABLETS EVERY 7 DAYS    . metoprolol tartrate (LOPRESSOR) 25 MG tablet TAKE 1/2 TABLET BY MOUTH 2 TIMES A DAY. 90 tablet 2  . ONE TOUCH ULTRA TEST test strip     . potassium chloride (KLOR-CON 10) 10 MEQ tablet Take 1 tablet (10 mEq total) by mouth daily. 30 tablet 4  . predniSONE (DELTASONE) 5 MG tablet Take 1 tablet by mouth daily. 2 1/2 TAB QD    . simvastatin (ZOCOR) 80 MG  tablet Take 80 mg by mouth at bedtime.      Marland Kitchen aspirin EC 81 MG tablet 2 tablets (162mg  ) daily     No current facility-administered medications for this visit.    BP 137/78 mmHg  Pulse 66  Ht 5\' 6"  (1.676 m)  Wt 166 lb (75.297 kg)  BMI 26.81 kg/m2 General: NAD Neck: JVP 7 cm, no thyromegaly or thyroid nodule.  Lungs: Slight decreased breath sounds left base.  CV: Nondisplaced PMI.  Heart regular S1/S2, +S4, 1/6 SEM RUSB.  Trace ankle edema bilaterally.  No carotid bruit.  Feet warm but unable to palpate pedal pulses.   Abdomen: Soft, nontender, no hepatosplenomegaly, no distention.  Neurologic: Alert and oriented x 3.  Psych: Normal affect. Extremities: No clubbing or cyanosis.    Assessment/Plan: 1. CAD: s/p CABG.  Continue ASA, ACEI, metoprolol, simvastatin.   2. Hyperlipidemia: Patient is on Zocor 80 mg daily but has been on this statin dose long-term so will continue it.  9/15 LDL was not ideal (goal < 70) but will not change medications.     3. Chronic diastolic CHF: NYHA class II symptoms.  She has minimal dyspnea.  Continue current Lasix.  4. PAD: Significant PAD, followed by [facility]. She does not report much in the way of claudication at this time.  5. HTN: BP is reasonably controlled.    Loralie Champagne 08/12/2014

## 2014-08-18 ENCOUNTER — Encounter (HOSPITAL_COMMUNITY): Payer: Self-pay | Admitting: Cardiology

## 2014-11-10 ENCOUNTER — Telehealth: Payer: Self-pay | Admitting: Cardiology

## 2014-11-10 NOTE — Telephone Encounter (Signed)
Received request from Nurse fax box, documents faxed for surgical clearance. To: [facility] Fax number: [phone] Attention: 3.3.16/km

## 2014-11-14 ENCOUNTER — Other Ambulatory Visit: Payer: Self-pay | Admitting: Cardiology

## 2014-11-18 ENCOUNTER — Telehealth: Payer: Self-pay | Admitting: Cardiology

## 2014-11-18 NOTE — Telephone Encounter (Signed)
Received request from Nurse fax box, documents faxed for surgical clearance. To: [facility] Fax number: [phone] Attention:Kathy Refaxed 3.11.16

## 2014-12-22 ENCOUNTER — Other Ambulatory Visit: Payer: Self-pay | Admitting: Orthopaedic Surgery

## 2014-12-29 ENCOUNTER — Encounter (HOSPITAL_COMMUNITY): Payer: Self-pay

## 2014-12-29 ENCOUNTER — Ambulatory Visit (HOSPITAL_COMMUNITY)
Admission: RE | Admit: 2014-12-29 | Discharge: 2014-12-29 | Disposition: A | Discharge status: Home / Self Care | Payer: Medicare Other | Source: Ambulatory Visit | Attending: Orthopaedic Surgery | Admitting: Orthopaedic Surgery

## 2014-12-29 ENCOUNTER — Encounter (HOSPITAL_COMMUNITY)
Admission: RE | Admit: 2014-12-29 | Discharge: 2014-12-29 | Disposition: A | Discharge status: Home / Self Care | Payer: Medicare Other | Source: Ambulatory Visit | Attending: Orthopaedic Surgery | Admitting: Orthopaedic Surgery

## 2014-12-29 DIAGNOSIS — Z01818 Encounter for other preprocedural examination: Secondary | ICD-10-CM | POA: Insufficient documentation

## 2014-12-29 DIAGNOSIS — J449 Chronic obstructive pulmonary disease, unspecified: Secondary | ICD-10-CM | POA: Insufficient documentation

## 2014-12-29 DIAGNOSIS — Z01812 Encounter for preprocedural laboratory examination: Secondary | ICD-10-CM | POA: Diagnosis not present

## 2014-12-29 DIAGNOSIS — Z951 Presence of aortocoronary bypass graft: Secondary | ICD-10-CM | POA: Insufficient documentation

## 2014-12-29 HISTORY — DX: Rheumatoid arthritis, unspecified: M06.9

## 2014-12-29 HISTORY — DX: Atherosclerotic heart disease of native coronary artery without angina pectoris: I25.10

## 2014-12-29 LAB — URINALYSIS, ROUTINE W REFLEX MICROSCOPIC
Bilirubin Urine: NEGATIVE
Glucose, UA: NEGATIVE mg/dL
KETONES UR: NEGATIVE mg/dL
LEUKOCYTES UA: NEGATIVE
NITRITE: NEGATIVE
PH: 7 (ref 5.0–8.0)
Protein, ur: NEGATIVE mg/dL
Specific Gravity, Urine: 1.006 (ref 1.005–1.030)
UROBILINOGEN UA: 0.2 mg/dL (ref 0.0–1.0)

## 2014-12-29 LAB — BASIC METABOLIC PANEL
Anion gap: 9 (ref 5–15)
BUN: 13 mg/dL (ref 6–23)
CO2: 29 mmol/L (ref 19–32)
Calcium: 9.2 mg/dL (ref 8.4–10.5)
Chloride: 103 mmol/L (ref 96–112)
Creatinine, Ser: 0.96 mg/dL (ref 0.50–1.10)
GFR calc Af Amer: 64 mL/min — ABNORMAL LOW (ref >90)
GFR calc non Af Amer: 55 mL/min — ABNORMAL LOW (ref >90)
GLUCOSE: 263 mg/dL — AB (ref 70–99)
POTASSIUM: 3.4 mmol/L — AB (ref 3.5–5.1)
SODIUM: 141 mmol/L (ref 135–145)

## 2014-12-29 LAB — CBC WITH DIFFERENTIAL/PLATELET
Basophils Absolute: 0 10*3/uL (ref 0.0–0.1)
Basophils Relative: 0 % (ref 0–1)
Eosinophils Absolute: 0.1 10*3/uL (ref 0.0–0.7)
Eosinophils Relative: 1 % (ref 0–5)
HEMATOCRIT: 37.2 % (ref 36.0–46.0)
Hemoglobin: 12.1 g/dL (ref 12.0–15.0)
LYMPHS ABS: 0.8 10*3/uL (ref 0.7–4.0)
Lymphocytes Relative: 7 % — ABNORMAL LOW (ref 12–46)
MCH: 31.5 pg (ref 26.0–34.0)
MCHC: 32.5 g/dL (ref 30.0–36.0)
MCV: 96.9 fL (ref 78.0–100.0)
MONOS PCT: 5 % (ref 3–12)
Monocytes Absolute: 0.6 10*3/uL (ref 0.1–1.0)
NEUTROS PCT: 87 % — AB (ref 43–77)
Neutro Abs: 10.3 10*3/uL — ABNORMAL HIGH (ref 1.7–7.7)
Platelets: 210 10*3/uL (ref 150–400)
RBC: 3.84 MIL/uL — AB (ref 3.87–5.11)
RDW: 16 % — ABNORMAL HIGH (ref 11.5–15.5)
WBC: 11.8 10*3/uL — AB (ref 4.0–10.5)

## 2014-12-29 LAB — URINE MICROSCOPIC-ADD ON

## 2014-12-29 LAB — APTT: aPTT: 25 seconds (ref 24–37)

## 2014-12-29 LAB — PROTIME-INR
INR: 1 (ref 0.00–1.49)
PROTHROMBIN TIME: 13.3 s (ref 11.6–15.2)

## 2014-12-29 LAB — SURGICAL PCR SCREEN
MRSA, PCR: NEGATIVE
Staphylococcus aureus: NEGATIVE

## 2014-12-29 NOTE — Progress Notes (Signed)
   12/29/14 1046  OBSTRUCTIVE SLEEP APNEA  Have you ever been diagnosed with sleep apnea through a sleep study? No  Do you snore loudly (loud enough to be heard through closed doors)?  0  Do you often feel tired, fatigued, or sleepy during the daytime? 0  Has anyone observed you stop breathing during your sleep? 0  Do you have, or are you being treated for high blood pressure? 1  BMI more than 35 kg/m2? 0  Age over 78 years old? 1  Neck circumference greater than 40 cm/16 inches? 0  Gender: 0

## 2014-12-29 NOTE — Pre-Procedure Instructions (Signed)
Petrey  12/29/2014   Your procedure is scheduled on:  Tuesday, May 3  Report to [facility] Admitting at 0530 AM.  Call this number if you have problems the morning of surgery: [phone]   Remember:   Do not eat food or drink liquids after midnight.Monday night   Take these medicines the morning of surgery with A SIP OF WATER: Metoprolol, Prednisone   Do not wear jewelry, make-up or nail polish.  Do not wear lotions, powders, or perfumes. You may not wear deodorant.  Do not shave 48 hours prior to surgery.    Do not bring valuables to the hospital.  [facility] is not responsible    for any belongings or valuables.               Contacts, dentures or bridgework may not be worn into surgery.  Leave suitcase in the car. After surgery it may be brought to your room.  For patients admitted to the hospital, discharge time is determined by your   treatment team.    Special Instructions: Follow instructions "Preparing for Surgery" fact sheet   Please read over the following fact sheets that you were given: Pain Booklet, Coughing and Deep Breathing, Total Joint Packet and Surgical Site Infection Prevention

## 2014-12-30 NOTE — Progress Notes (Signed)
Anesthesia Chart Review:  Pt is 80 year old female scheduled for L total hip arthroplasty on 01/10/2015 with Dr. Rhona Raider.   Cardiologist is Dr. Aundra Dubin.   PMH includes: CAD (LHC (9/14) with 80% ostial LAD, 95% ostial LCx, 95% pRCA (nondominant). Patient had CABG (9/14) with LIMA-LAD, seq SVG-OM1/PLOM, SVG-AM), HTN, DM, hypercholesterolemia, [name], anemia, rheumatoid arthritis. Former smoker. BMI 27. S/p CABG 05/24/13.   Medications include: ASA, benazepril, lasix, glimepiride, methotrexate, metoprolol, prednisone.   Preoperative labs reviewed.  Glucose 263. Called and spoke with pt. She reports usual fasting blood sugars 70-110. When she checks her blood sugar in the evenings, she reports "it's usually less than 300". Thinks her last HgbA1c was 8.2 in February, but she isn't positive. I explained if her blood sugar was greater than 200 DOS she would likely not be able to proceed with surgery. I suggested she talk with her PCP about achieving better blood sugar control. Notified Juliann Pulse in Dr. Jerald Kief office of this result.   Chest x-ray reviewed. COPD and post CABG changes. No active cardiopulmonary disease.   EKG 08/11/2014: NSR. LAD. Septal infarct, age undetermined.   Echo 05/17/2013: - Left ventricle: The cavity size was normal. Wall thickness was increased in a pattern of moderate LVH. There was mild concentric hypertrophy. Systolic function was normal. The estimated ejection fraction was in the range of 60% to 65%. Wall motion was normal; there were no regional wall motion abnormalities. Doppler parameters are consistent with abnormal left ventricular relaxation (grade 1 diastolic dysfunction). Doppler parameters are consistent with high ventricular filling pressure. - Aortic valve: Trivial regurgitation. - Mitral valve: Calcified annulus. Normal thickness leaflets. Mild regurgitation. - Left atrium: The atrium was mildly dilated. - Pulmonary arteries: Systolic pressure was mildly increased. PA  peak pressure: 30mm Hg (S).  Pt has cardiac clearance from Dr. Aundra Dubin. He notes pt "needs to continue metoprolol peri-operatively".   If pt's blood sugar is acceptable DOS, I anticipate she can proceed as scheduled.   Willeen Cass, FNP-BC [facility]/Anesthesiology Phone: [phone] 12/30/2014 3:09 PM

## 2015-01-03 DIAGNOSIS — N390 Urinary tract infection, site not specified: Secondary | ICD-10-CM

## 2015-01-03 HISTORY — DX: Urinary tract infection, site not specified: N39.0

## 2015-01-04 NOTE — H&P (Signed)
TOTAL HIP ADMISSION H&P  Patient is admitted for left total hip arthroplasty.  Subjective:  Chief Complaint: left hip pain  HPI: Patricia Villegas, 80 y.o. female, has a history of pain and functional disability in the left hip(s) due to arthritis and patient has failed non-surgical conservative treatments for greater than 12 weeks to include NSAID's and/or analgesics, flexibility and strengthening excercises, use of assistive devices, weight reduction as appropriate and activity modification.  Onset of symptoms was gradual starting 5 years ago with gradually worsening course since that time.The patient noted no past surgery on the left hip(s).  Patient currently rates pain in the left hip at 10 out of 10 with activity. Patient has night pain, worsening of pain with activity and weight bearing, trendelenberg gait, pain that interfers with activities of daily living and crepitus. Patient has evidence of subchondral cysts, subchondral sclerosis, periarticular osteophytes and joint space narrowing by imaging studies. This condition presents safety issues increasing the risk of falls. There is no current active infection.  Patient Active Problem List   Diagnosis Date Noted  . Chronic diastolic CHF (congestive heart failure) 06/30/2013  . CAD, multiple vessel 05/18/2013  . Femoral artery occlusion, bilateral 05/18/2013  . Diabetes 1.5, managed as type 2 05/18/2013  . Rheumatoid arthritis 05/18/2013  . Chest pain 09/18/2011  . Peripheral vascular disease 09/18/2011  . Unstable angina 08/23/2011  . Hypertension 08/23/2011  . Hyperlipidemia 08/23/2011   Past Medical History  Diagnosis Date  . Diabetes mellitus     Greater than 20 years  . Hypertension     Greater than 20 years  . Hypercholesteremia   . [name] (peripheral vascular disease)     Stable claudication. Arteriogram 07/2009 no significant aortoiliac disease, has bilateral SFA occlusions with distal reconstitution   . Closed left fibular  fracture     Casted   . Lumbar disc disease     S/p surgery x4;  Sciatica   . Anemia   . Blood transfusion   . Transfusion reaction 1960    Convulsions (? seizure)  . H/O hiatal hernia   . Parastomal hernia of ileal conduit 2009  . Arthritis   . Anxiety   . Depression   . Chest pain     a. Lexiscan Myoview 12/12:  EF 75%, no ischemia or scar;  b.  Echo 12/12:  EF 60-65%, no sig valvular abnormalities   . Shortness of breath     Hx: of with exertion  . Neuropathy due to secondary diabetes     Hx: of  . Complication of anesthesia 1998    Unable to arouse fully, respiratory depression, pt. reports that the family was told that she would need a trach., but then she stabilized & didn't need it   . Coronary artery disease     sees Dr Aundra Dubin every 6 months  . Pneumonia     s/p CABG  . Rheumatoid arthritis     Dr Berna Bue @ [facility]    Past Surgical History  Procedure Laterality Date  . Hernia repair  2009  . Back surgery      4 back operations most recently 1995, 1998  . Cardiac catheterization      Hx: of 05/14/13  . Tonsillectomy    . Colonoscopy      Hx; of  . Abdominal hysterectomy    . Dilation and curettage of uterus    . Cataract extraction w/ intraocular lens  implant, bilateral  Hx: of  . Coronary artery bypass graft N/A 05/24/2013    Procedure: CORONARY ARTERY BYPASS GRAFTING (CABG);  Surgeon: Melrose Nakayama, MD;  Location: [facility];  Service: Open Heart Surgery;  Laterality: N/A;  . Left heart catheterization with coronary angiogram N/A 05/14/2013    Procedure: LEFT HEART CATHETERIZATION WITH CORONARY ANGIOGRAM;  Surgeon: Larey Dresser, MD;  Location: [facility] CATH LAB;  Service: Cardiovascular;  Laterality: N/A;    No prescriptions prior to admission   No Known Allergies  History  Substance Use Topics  . Smoking status: Former Smoker -- 0.50 packs/day for 10 years    Types: Cigarettes    Quit date: 09/09/1996  . Smokeless tobacco: Former Systems developer     Types: Chew  . Alcohol Use: No    Family History  Problem Relation Age of Onset  . Coronary artery disease Mother 79  . Diabetes Mother   . Stroke Mother   . Heart attack Mother   . Cancer Brother   . Heart disease Brother   . Stroke Brother   . Kidney disease Brother   . Cancer Sister   . Diabetes Brother      Review of Systems  Musculoskeletal: Positive for joint pain.       Left hip  All other systems reviewed and are negative.   Objective:  Physical Exam  Constitutional: She is oriented to person, place, and time. She appears well-developed and well-nourished.  HENT:  Head: Normocephalic and atraumatic.  Eyes: Pupils are equal, round, and reactive to light.  Neck: Normal range of motion.  Cardiovascular: Normal rate and regular rhythm.   Respiratory: Effort normal.  GI: Soft.  Musculoskeletal:  Left hip has very little rotational movement. This is all extremely painful. Straight leg raise is negative. She has about 10 hip flexion contracture. Leg lengths seemed roughly equal. Sensation and motor function are intact in her feet with palpable pulses on both sides. Abdominal exam is benign. There is no palpable lymphadenopathy about her groin on either side.   Neurological: She is alert and oriented to person, place, and time.  Skin: Skin is warm and dry.  Psychiatric: She has a normal mood and affect. Her behavior is normal. Judgment and thought content normal.    Vital signs in last 24 hours:    Labs:   Estimated body mass index is 26.81 kg/(m^2) as calculated from the following:   Height as of 08/11/14: 5\' 6"  (1.676 m).   Weight as of 08/11/14: 75.297 kg (166 lb).   Imaging Review Plain radiographs demonstrate severe degenerative joint disease of the left hip(s). The bone quality appears to be good for age and reported activity level.  Assessment/Plan:  End stage primary arthritis, left hip(s)  The patient history, physical examination, clinical  judgement of the provider and imaging studies are consistent with end stage degenerative joint disease of the left hip(s) and total hip arthroplasty is deemed medically necessary. The treatment options including medical management, injection therapy, arthroscopy and arthroplasty were discussed at length. The risks and benefits of total hip arthroplasty were presented and reviewed. The risks due to aseptic loosening, infection, stiffness, dislocation/subluxation,  thromboembolic complications and other imponderables were discussed.  The patient acknowledged the explanation, agreed to proceed with the plan and consent was signed. Patient is being admitted for inpatient treatment for surgery, pain control, PT, OT, prophylactic antibiotics, VTE prophylaxis, progressive ambulation and ADL's and discharge planning.The patient is planning to be discharged home with home health  services

## 2015-01-09 MED ORDER — LACTATED RINGERS IV SOLN: INTRAVENOUS | Status: DC

## 2015-01-09 MED ORDER — CHLORHEXIDINE GLUCONATE 4 % EX LIQD
60.0000 mL | Freq: Once | CUTANEOUS | Status: DC
  Filled 2015-01-09: qty 60

## 2015-01-09 MED ORDER — CEFAZOLIN SODIUM-DEXTROSE 2-3 GM-% IV SOLR
2.0000 g | INTRAVENOUS | Status: AC
  Administered 2015-01-10: 2 g via INTRAVENOUS
  Filled 2015-01-09: qty 50

## 2015-01-10 ENCOUNTER — Encounter (HOSPITAL_COMMUNITY): Payer: Self-pay | Admitting: *Deleted

## 2015-01-10 ENCOUNTER — Encounter (HOSPITAL_COMMUNITY)
Admission: RE | Disposition: A | Discharge status: Home Health | Payer: Self-pay | Source: Ambulatory Visit | Attending: Orthopaedic Surgery

## 2015-01-10 ENCOUNTER — Inpatient Hospital Stay (HOSPITAL_COMMUNITY): Payer: Medicare Other | Admitting: Emergency Medicine

## 2015-01-10 ENCOUNTER — Inpatient Hospital Stay (HOSPITAL_COMMUNITY)
Admission: RE | Admit: 2015-01-10 | Discharge: 2015-01-12 | DRG: 470 | Disposition: A | Discharge status: Home Health | Payer: Medicare Other | Source: Ambulatory Visit | Attending: Orthopaedic Surgery | Admitting: Orthopaedic Surgery

## 2015-01-10 ENCOUNTER — Inpatient Hospital Stay (HOSPITAL_COMMUNITY): Payer: Medicare Other

## 2015-01-10 ENCOUNTER — Inpatient Hospital Stay (HOSPITAL_COMMUNITY): Payer: Medicare Other | Admitting: Anesthesiology

## 2015-01-10 DIAGNOSIS — M069 Rheumatoid arthritis, unspecified: Secondary | ICD-10-CM | POA: Diagnosis present

## 2015-01-10 DIAGNOSIS — Z833 Family history of diabetes mellitus: Secondary | ICD-10-CM

## 2015-01-10 DIAGNOSIS — I10 Essential (primary) hypertension: Secondary | ICD-10-CM | POA: Diagnosis present

## 2015-01-10 DIAGNOSIS — M1612 Unilateral primary osteoarthritis, left hip: Principal | ICD-10-CM | POA: Diagnosis present

## 2015-01-10 DIAGNOSIS — E780 Pure hypercholesterolemia: Secondary | ICD-10-CM | POA: Diagnosis present

## 2015-01-10 DIAGNOSIS — I5032 Chronic diastolic (congestive) heart failure: Secondary | ICD-10-CM | POA: Diagnosis present

## 2015-01-10 DIAGNOSIS — M25552 Pain in left hip: Secondary | ICD-10-CM | POA: Diagnosis present

## 2015-01-10 DIAGNOSIS — Z79899 Other long term (current) drug therapy: Secondary | ICD-10-CM | POA: Diagnosis not present

## 2015-01-10 DIAGNOSIS — Z87891 Personal history of nicotine dependence: Secondary | ICD-10-CM

## 2015-01-10 DIAGNOSIS — E139 Other specified diabetes mellitus without complications: Secondary | ICD-10-CM | POA: Diagnosis present

## 2015-01-10 DIAGNOSIS — Z7952 Long term (current) use of systemic steroids: Secondary | ICD-10-CM

## 2015-01-10 DIAGNOSIS — I70213 Atherosclerosis of native arteries of extremities with intermittent claudication, bilateral legs: Secondary | ICD-10-CM | POA: Diagnosis present

## 2015-01-10 DIAGNOSIS — Z7982 Long term (current) use of aspirin: Secondary | ICD-10-CM

## 2015-01-10 DIAGNOSIS — Z823 Family history of stroke: Secondary | ICD-10-CM | POA: Diagnosis not present

## 2015-01-10 DIAGNOSIS — Z419 Encounter for procedure for purposes other than remedying health state, unspecified: Secondary | ICD-10-CM

## 2015-01-10 DIAGNOSIS — Z951 Presence of aortocoronary bypass graft: Secondary | ICD-10-CM

## 2015-01-10 DIAGNOSIS — E1140 Type 2 diabetes mellitus with diabetic neuropathy, unspecified: Secondary | ICD-10-CM | POA: Diagnosis present

## 2015-01-10 DIAGNOSIS — I2510 Atherosclerotic heart disease of native coronary artery without angina pectoris: Secondary | ICD-10-CM | POA: Diagnosis present

## 2015-01-10 DIAGNOSIS — Z8249 Family history of ischemic heart disease and other diseases of the circulatory system: Secondary | ICD-10-CM | POA: Diagnosis not present

## 2015-01-10 HISTORY — DX: Urinary tract infection, site not specified: N39.0

## 2015-01-10 HISTORY — PX: TOTAL HIP ARTHROPLASTY: SHX124

## 2015-01-10 LAB — GLUCOSE, CAPILLARY
GLUCOSE-CAPILLARY: 273 mg/dL — AB (ref 70–99)
GLUCOSE-CAPILLARY: 63 mg/dL — AB (ref 70–99)
Glucose-Capillary: 127 mg/dL — ABNORMAL HIGH (ref 70–99)
Glucose-Capillary: 86 mg/dL (ref 70–99)

## 2015-01-10 SURGERY — ARTHROPLASTY, HIP, TOTAL, ANTERIOR APPROACH
Anesthesia: Monitor Anesthesia Care | Site: Hip | Laterality: Left

## 2015-01-10 MED ORDER — BISACODYL 5 MG PO TBEC: 5.0000 mg | DELAYED_RELEASE_TABLET | Freq: Every day | ORAL | Status: DC | PRN

## 2015-01-10 MED ORDER — CEFAZOLIN SODIUM-DEXTROSE 2-3 GM-% IV SOLR
2.0000 g | Freq: Four times a day (QID) | INTRAVENOUS | Status: AC
  Administered 2015-01-10 (×2): 2 g via INTRAVENOUS
  Filled 2015-01-10 (×2): qty 50

## 2015-01-10 MED ORDER — PROPOFOL 10 MG/ML IV BOLUS
INTRAVENOUS | Status: AC
  Filled 2015-01-10: qty 20

## 2015-01-10 MED ORDER — FUROSEMIDE 40 MG PO TABS
40.0000 mg | ORAL_TABLET | Freq: Every day | ORAL | Status: DC
  Administered 2015-01-10 – 2015-01-12 (×3): 40 mg via ORAL
  Filled 2015-01-10 (×3): qty 1

## 2015-01-10 MED ORDER — LIDOCAINE HCL (CARDIAC) 20 MG/ML IV SOLN
INTRAVENOUS | Status: AC
  Filled 2015-01-10: qty 5

## 2015-01-10 MED ORDER — PHENOL 1.4 % MT LIQD: 1.0000 | OROMUCOSAL | Status: DC | PRN

## 2015-01-10 MED ORDER — DEXTROSE 50 % IV SOLN
INTRAVENOUS | Status: AC
  Filled 2015-01-10: qty 50

## 2015-01-10 MED ORDER — PREDNISONE 5 MG PO TABS
5.0000 mg | ORAL_TABLET | Freq: Every day | ORAL | Status: DC
  Administered 2015-01-10 – 2015-01-12 (×3): 5 mg via ORAL
  Filled 2015-01-10 (×3): qty 1

## 2015-01-10 MED ORDER — LINAGLIPTIN 5 MG PO TABS
5.0000 mg | ORAL_TABLET | Freq: Every day | ORAL | Status: DC
  Administered 2015-01-10 – 2015-01-12 (×3): 5 mg via ORAL
  Filled 2015-01-10 (×3): qty 1

## 2015-01-10 MED ORDER — METHOCARBAMOL 500 MG PO TABS: 500.0000 mg | ORAL_TABLET | Freq: Four times a day (QID) | ORAL | Status: DC | PRN

## 2015-01-10 MED ORDER — DOCUSATE SODIUM 100 MG PO CAPS
100.0000 mg | ORAL_CAPSULE | Freq: Two times a day (BID) | ORAL | Status: DC
  Administered 2015-01-10 – 2015-01-12 (×5): 100 mg via ORAL
  Filled 2015-01-10 (×5): qty 1

## 2015-01-10 MED ORDER — PHENYLEPHRINE HCL 10 MG/ML IJ SOLN
INTRAMUSCULAR | Status: AC
  Filled 2015-01-10: qty 1

## 2015-01-10 MED ORDER — DIPHENHYDRAMINE HCL 12.5 MG/5ML PO ELIX: 12.5000 mg | ORAL_SOLUTION | ORAL | Status: DC | PRN

## 2015-01-10 MED ORDER — DEXTROSE 50 % IV SOLN
25.0000 mL | Freq: Once | INTRAVENOUS | Status: AC
  Administered 2015-01-10: 25 mL via INTRAVENOUS

## 2015-01-10 MED ORDER — METOPROLOL TARTRATE 12.5 MG HALF TABLET
12.5000 mg | ORAL_TABLET | Freq: Two times a day (BID) | ORAL | Status: DC
Start: 2015-01-10 — End: 2015-01-12
  Administered 2015-01-10 – 2015-01-11 (×2): 12.5 mg via ORAL
  Filled 2015-01-10 (×3): qty 1

## 2015-01-10 MED ORDER — ACETAMINOPHEN 10 MG/ML IV SOLN
INTRAVENOUS | Status: DC | PRN
  Administered 2015-01-10: 1000 mg via INTRAVENOUS

## 2015-01-10 MED ORDER — INSULIN ASPART 100 UNIT/ML ~~LOC~~ SOLN
0.0000 [IU] | Freq: Three times a day (TID) | SUBCUTANEOUS | Status: DC
  Administered 2015-01-11: 5 [IU] via SUBCUTANEOUS
  Administered 2015-01-11: 2 [IU] via SUBCUTANEOUS

## 2015-01-10 MED ORDER — GLYCOPYRROLATE 0.2 MG/ML IJ SOLN
INTRAMUSCULAR | Status: AC
  Filled 2015-01-10: qty 1

## 2015-01-10 MED ORDER — METHOCARBAMOL 1000 MG/10ML IJ SOLN
500.0000 mg | Freq: Four times a day (QID) | INTRAVENOUS | Status: DC | PRN
  Filled 2015-01-10: qty 5

## 2015-01-10 MED ORDER — LACTATED RINGERS IV SOLN
INTRAVENOUS | Status: DC
  Administered 2015-01-10 (×3): via INTRAVENOUS

## 2015-01-10 MED ORDER — ACETAMINOPHEN 650 MG RE SUPP: 650.0000 mg | Freq: Four times a day (QID) | RECTAL | Status: DC | PRN

## 2015-01-10 MED ORDER — PROPOFOL 10 MG/ML IV BOLUS
INTRAVENOUS | Status: DC | PRN
  Administered 2015-01-10 (×3): 20 mg via INTRAVENOUS

## 2015-01-10 MED ORDER — MIDAZOLAM HCL 2 MG/2ML IJ SOLN
INTRAMUSCULAR | Status: AC
  Filled 2015-01-10: qty 2

## 2015-01-10 MED ORDER — GLIMEPIRIDE 4 MG PO TABS
4.0000 mg | ORAL_TABLET | Freq: Two times a day (BID) | ORAL | Status: DC
  Administered 2015-01-10 – 2015-01-12 (×4): 4 mg via ORAL
  Filled 2015-01-10 (×4): qty 1

## 2015-01-10 MED ORDER — ALUM & MAG HYDROXIDE-SIMETH 200-200-20 MG/5ML PO SUSP
30.0000 mL | ORAL | Status: DC | PRN
Start: 2015-01-10 — End: 2015-01-12

## 2015-01-10 MED ORDER — HYDROMORPHONE HCL 1 MG/ML IJ SOLN
0.5000 mg | INTRAMUSCULAR | Status: DC | PRN
  Administered 2015-01-10 – 2015-01-11 (×4): 1 mg via INTRAVENOUS
  Filled 2015-01-10 (×4): qty 1

## 2015-01-10 MED ORDER — FENTANYL CITRATE (PF) 100 MCG/2ML IJ SOLN
INTRAMUSCULAR | Status: DC | PRN
  Administered 2015-01-10 (×3): 50 ug via INTRAVENOUS

## 2015-01-10 MED ORDER — MENTHOL 3 MG MT LOZG: 1.0000 | LOZENGE | OROMUCOSAL | Status: DC | PRN

## 2015-01-10 MED ORDER — FOLIC ACID 1 MG PO TABS
1.0000 mg | ORAL_TABLET | Freq: Every day | ORAL | Status: DC
  Administered 2015-01-10 – 2015-01-12 (×3): 1 mg via ORAL
  Filled 2015-01-10 (×3): qty 1

## 2015-01-10 MED ORDER — TRANEXAMIC ACID 1000 MG/10ML IV SOLN
2000.0000 mg | Freq: Once | INTRAVENOUS | Status: DC
  Filled 2015-01-10: qty 20

## 2015-01-10 MED ORDER — PROPOFOL INFUSION 10 MG/ML OPTIME
INTRAVENOUS | Status: DC | PRN
  Administered 2015-01-10: 10 ug/kg/min via INTRAVENOUS

## 2015-01-10 MED ORDER — SUCCINYLCHOLINE CHLORIDE 20 MG/ML IJ SOLN
INTRAMUSCULAR | Status: AC
  Filled 2015-01-10: qty 1

## 2015-01-10 MED ORDER — SODIUM CHLORIDE 0.9 % IJ SOLN
INTRAMUSCULAR | Status: AC
  Filled 2015-01-10: qty 10

## 2015-01-10 MED ORDER — EPHEDRINE SULFATE 50 MG/ML IJ SOLN
INTRAMUSCULAR | Status: DC | PRN
  Administered 2015-01-10: 10 mg via INTRAVENOUS

## 2015-01-10 MED ORDER — ONDANSETRON HCL 4 MG/2ML IJ SOLN
INTRAMUSCULAR | Status: DC | PRN
  Administered 2015-01-10: 4 mg via INTRAVENOUS

## 2015-01-10 MED ORDER — METOCLOPRAMIDE HCL 5 MG PO TABS: 5.0000 mg | ORAL_TABLET | Freq: Three times a day (TID) | ORAL | Status: DC | PRN

## 2015-01-10 MED ORDER — ONDANSETRON HCL 4 MG/2ML IJ SOLN: 4.0000 mg | Freq: Four times a day (QID) | INTRAMUSCULAR | Status: DC | PRN

## 2015-01-10 MED ORDER — ONDANSETRON HCL 4 MG PO TABS: 4.0000 mg | ORAL_TABLET | Freq: Four times a day (QID) | ORAL | Status: DC | PRN

## 2015-01-10 MED ORDER — ACETAMINOPHEN 10 MG/ML IV SOLN
INTRAVENOUS | Status: AC
  Filled 2015-01-10: qty 100

## 2015-01-10 MED ORDER — BENAZEPRIL HCL 20 MG PO TABS
20.0000 mg | ORAL_TABLET | Freq: Every day | ORAL | Status: DC
  Administered 2015-01-11: 20 mg via ORAL
  Filled 2015-01-10 (×2): qty 1

## 2015-01-10 MED ORDER — ASPIRIN EC 325 MG PO TBEC
325.0000 mg | DELAYED_RELEASE_TABLET | Freq: Two times a day (BID) | ORAL | Status: DC
  Administered 2015-01-11 – 2015-01-12 (×3): 325 mg via ORAL
  Filled 2015-01-10 (×3): qty 1

## 2015-01-10 MED ORDER — ACETAMINOPHEN 325 MG PO TABS: 650.0000 mg | ORAL_TABLET | Freq: Four times a day (QID) | ORAL | Status: DC | PRN

## 2015-01-10 MED ORDER — ATORVASTATIN CALCIUM 40 MG PO TABS
40.0000 mg | ORAL_TABLET | Freq: Every day | ORAL | Status: DC
  Administered 2015-01-11: 40 mg via ORAL
  Filled 2015-01-10: qty 1

## 2015-01-10 MED ORDER — TRANEXAMIC ACID 1000 MG/10ML IV SOLN
2000.0000 mg | INTRAVENOUS | Status: DC | PRN
Start: 2015-01-10 — End: 2015-01-10
  Administered 2015-01-10: 2000 mg via TOPICAL

## 2015-01-10 MED ORDER — HYDROCODONE-ACETAMINOPHEN 5-325 MG PO TABS
1.0000 | ORAL_TABLET | ORAL | Status: DC | PRN
  Administered 2015-01-10: 2 via ORAL
  Administered 2015-01-10: 1 via ORAL
  Administered 2015-01-11: 2 via ORAL
  Administered 2015-01-12: 1 via ORAL
  Filled 2015-01-10: qty 2
  Filled 2015-01-10 (×2): qty 1
  Filled 2015-01-10: qty 2

## 2015-01-10 MED ORDER — EPHEDRINE SULFATE 50 MG/ML IJ SOLN
INTRAMUSCULAR | Status: AC
  Filled 2015-01-10: qty 1

## 2015-01-10 MED ORDER — 0.9 % SODIUM CHLORIDE (POUR BTL) OPTIME
TOPICAL | Status: DC | PRN
Start: 2015-01-10 — End: 2015-01-10
  Administered 2015-01-10: 1000 mL

## 2015-01-10 MED ORDER — ONDANSETRON HCL 4 MG/2ML IJ SOLN
INTRAMUSCULAR | Status: AC
  Filled 2015-01-10: qty 2

## 2015-01-10 MED ORDER — LACTATED RINGERS IV SOLN
INTRAVENOUS | Status: DC
  Administered 2015-01-10: 16:00:00 via INTRAVENOUS

## 2015-01-10 MED ORDER — METOCLOPRAMIDE HCL 5 MG/ML IJ SOLN: 5.0000 mg | Freq: Three times a day (TID) | INTRAMUSCULAR | Status: DC | PRN

## 2015-01-10 MED ORDER — BUPIVACAINE HCL (PF) 0.5 % IJ SOLN
INTRAMUSCULAR | Status: DC | PRN
  Administered 2015-01-10: 3 mL

## 2015-01-10 MED ORDER — ROCURONIUM BROMIDE 50 MG/5ML IV SOLN
INTRAVENOUS | Status: AC
  Filled 2015-01-10: qty 1

## 2015-01-10 MED ORDER — ALOGLIPTIN BENZOATE 25 MG PO TABS: 25.0000 mg | ORAL_TABLET | Freq: Every day | ORAL | Status: DC

## 2015-01-10 MED ORDER — FENTANYL CITRATE (PF) 250 MCG/5ML IJ SOLN
INTRAMUSCULAR | Status: AC
  Filled 2015-01-10: qty 5

## 2015-01-10 SURGICAL SUPPLY — 51 items
BENZOIN TINCTURE PRP APPL 2/3 (GAUZE/BANDAGES/DRESSINGS) ×3 IMPLANT
BLADE SAW SGTL 18X1.27X75 (BLADE) ×2 IMPLANT
BLADE SAW SGTL 18X1.27X75MM (BLADE) ×1
BLADE SURG ROTATE 9660 (MISCELLANEOUS) IMPLANT
CAPT HIP TOTAL 2 ×3 IMPLANT
CELLS DAT CNTRL 66122 CELL SVR (MISCELLANEOUS) ×1 IMPLANT
CLOSURE STERI-STRIP 1/2X4 (GAUZE/BANDAGES/DRESSINGS) ×2
CLSR STERI-STRIP ANTIMIC 1/2X4 (GAUZE/BANDAGES/DRESSINGS) ×4 IMPLANT
COVER PERINEAL POST (MISCELLANEOUS) ×3 IMPLANT
COVER SURGICAL LIGHT HANDLE (MISCELLANEOUS) ×3 IMPLANT
DRAPE C-ARM 42X72 X-RAY (DRAPES) ×3 IMPLANT
DRAPE IMP U-DRAPE 54X76 (DRAPES) ×3 IMPLANT
DRAPE STERI IOBAN 125X83 (DRAPES) ×3 IMPLANT
DRAPE U-SHAPE 47X51 STRL (DRAPES) ×9 IMPLANT
DRSG AQUACEL AG ADV 3.5X10 (GAUZE/BANDAGES/DRESSINGS) ×3 IMPLANT
DURAPREP 26ML APPLICATOR (WOUND CARE) ×3 IMPLANT
ELECT BLADE 4.0 EZ CLEAN MEGAD (MISCELLANEOUS)
ELECT CAUTERY BLADE 6.4 (BLADE) ×3 IMPLANT
ELECT REM PT RETURN 9FT ADLT (ELECTROSURGICAL) ×3
ELECTRODE BLDE 4.0 EZ CLN MEGD (MISCELLANEOUS) IMPLANT
ELECTRODE REM PT RTRN 9FT ADLT (ELECTROSURGICAL) ×1 IMPLANT
FACESHIELD WRAPAROUND (MASK) ×6 IMPLANT
GLOVE BIO SURGEON STRL SZ8 (GLOVE) ×15 IMPLANT
GLOVE BIOGEL PI IND STRL 8 (GLOVE) ×2 IMPLANT
GLOVE BIOGEL PI INDICATOR 8 (GLOVE) ×4
GOWN STRL REUS W/ TWL LRG LVL3 (GOWN DISPOSABLE) ×1 IMPLANT
GOWN STRL REUS W/ TWL XL LVL3 (GOWN DISPOSABLE) ×2 IMPLANT
GOWN STRL REUS W/TWL LRG LVL3 (GOWN DISPOSABLE) ×2
GOWN STRL REUS W/TWL XL LVL3 (GOWN DISPOSABLE) ×4
KIT BASIN OR (CUSTOM PROCEDURE TRAY) ×3 IMPLANT
KIT ROOM TURNOVER OR (KITS) ×3 IMPLANT
LINER BOOT UNIVERSAL DISP (MISCELLANEOUS) ×3 IMPLANT
MANIFOLD NEPTUNE II (INSTRUMENTS) ×3 IMPLANT
NS IRRIG 1000ML POUR BTL (IV SOLUTION) ×3 IMPLANT
PACK TOTAL JOINT (CUSTOM PROCEDURE TRAY) ×3 IMPLANT
PACK UNIVERSAL I (CUSTOM PROCEDURE TRAY) ×3 IMPLANT
PAD ARMBOARD 7.5X6 YLW CONV (MISCELLANEOUS) ×6 IMPLANT
RTRCTR WOUND ALEXIS 18CM MED (MISCELLANEOUS) ×3
STAPLER VISISTAT 35W (STAPLE) ×3 IMPLANT
SUT ETHIBOND NAB CT1 #1 30IN (SUTURE) ×9 IMPLANT
SUT VIC AB 0 CT1 27 (SUTURE)
SUT VIC AB 0 CT1 27XBRD ANBCTR (SUTURE) IMPLANT
SUT VIC AB 1 CT1 27 (SUTURE) ×2
SUT VIC AB 1 CT1 27XBRD ANBCTR (SUTURE) ×1 IMPLANT
SUT VIC AB 2-0 CT1 27 (SUTURE) ×2
SUT VIC AB 2-0 CT1 TAPERPNT 27 (SUTURE) ×1 IMPLANT
SUT VLOC 180 0 24IN GS25 (SUTURE) ×3 IMPLANT
TOWEL OR 17X24 6PK STRL BLUE (TOWEL DISPOSABLE) ×3 IMPLANT
TOWEL OR 17X26 10 PK STRL BLUE (TOWEL DISPOSABLE) ×6 IMPLANT
TRAY FOLEY CATH 14FR (SET/KITS/TRAYS/PACK) IMPLANT
WATER STERILE IRR 1000ML POUR (IV SOLUTION) ×6 IMPLANT

## 2015-01-10 NOTE — Op Note (Signed)

## 2015-01-10 NOTE — Transfer of Care (Signed)
Immediate Anesthesia Transfer of Care Note  Patient: Patricia Villegas  Procedure(s) Performed: Procedure(s): TOTAL HIP ARTHROPLASTY ANTERIOR APPROACH (Left)  Patient Location: PACU  Anesthesia Type:MAC and Spinal  Level of Consciousness: awake, alert , oriented and sedated  Airway & Oxygen Therapy: Patient Spontanous Breathing and Patient connected to nasal cannula oxygen  Post-op Assessment: Report given to RN and Post -op Vital signs reviewed and stable  Post vital signs: Reviewed and stable  Last Vitals:  Filed Vitals:   01/10/15 1007  BP:   Pulse: 62  Temp: 36.3 C  Resp:     Complications: No apparent anesthesia complications

## 2015-01-10 NOTE — Progress Notes (Signed)
Pt. 's blood sugar is 63. States she is not symptomatic. States her sugar usually runs in the 60's when she first wakes up. Notified Dr. Linna Caprice, stated to get iv in and give 1/2 amp of dextrose.

## 2015-01-10 NOTE — Progress Notes (Signed)
Utilization review completed.  

## 2015-01-10 NOTE — Evaluation (Signed)
Physical Therapy Evaluation Patient Details Name: Patricia Villegas MRN: [id number] DOB: March 03, 1935 Today's Date: 01/10/2015   History of Present Illness  Patient is a 80 y/o female s/p L THA. PMH of HTN, [name], anxiety, depression, peripheral neuropathy, CAD s/p CABG, RA and lumbar disc disease s/p surgery.    Clinical Impression  Patient presents with pain and post surgical deficits LLE s/p above surgery. Education provided on HEP. Pt tolerated short distance ambulation with Min guard for safety. Pt will have 24.7 S at home. Would benefit from skilled PT to improve transfers, gait, balance and mobility so pt can maximize independence and return to PLOF.    Follow Up Recommendations Home health PT;Supervision/Assistance - 24 hour    Equipment Recommendations  Rolling walker with 5" wheels    Recommendations for Other Services       Precautions / Restrictions Precautions Precautions: Fall Precaution Comments: Direct anterior approach. Restrictions Weight Bearing Restrictions: Yes LLE Weight Bearing: Weight bearing as tolerated      Mobility  Bed Mobility Overal bed mobility: Needs Assistance Bed Mobility: Supine to Sit     Supine to sit: Min assist;HOB elevated     General bed mobility comments: Min A to bring LLE to EOB. Cues for technique. Use of rails for support (pt has rails on bed at home).  Transfers Overall transfer level: Needs assistance Equipment used: Rolling walker (2 wheeled) Transfers: Sit to/from Stand Sit to Stand: Min guard         General transfer comment: Min guard for safety. Cues for hand placement and technique.  Ambulation/Gait Ambulation/Gait assistance: Min guard Ambulation Distance (Feet): 16 Feet Assistive device: Rolling walker (2 wheeled) Gait Pattern/deviations: Step-to pattern;Decreased stance time - left;Decreased step length - right;Trunk flexed   Gait velocity interpretation: Below normal speed for age/gender General Gait Details:  Pt with step to gait pattern. Very slow.  Stairs            Wheelchair Mobility    Modified Rankin (Stroke Patients Only)       Balance Overall balance assessment: Needs assistance Sitting-balance support: Feet supported;No upper extremity supported Sitting balance-Leahy Scale: Fair     Standing balance support: During functional activity Standing balance-Leahy Scale: Poor Standing balance comment: Relient on RW for support.      Mild swelling in LLE.                       Pertinent Vitals/Pain Pain Assessment: Faces Faces Pain Scale: Hurts little more Pain Location: left hip with movement Pain Descriptors / Indicators: Sore Pain Intervention(s): Monitored during session;Repositioned    Home Living Family/patient expects to be discharged to:: Private residence Living Arrangements: Spouse/significant other Available Help at Discharge: Family;Available 24 hours/day;Friend(s);Available PRN/intermittently Type of Home: House Home Access: Stairs to enter Entrance Stairs-Rails: Right Entrance Stairs-Number of Steps: 4 Home Layout: Two level;Able to live on main level with bedroom/bathroom;Laundry or work area in [place]: Kasandra Knudsen - single point;Bedside commode;Walker - standard      Prior Function Level of Independence: Independent with assistive device(s)         Comments: Pt using SPC PTA for ~ 1 month.     Hand Dominance        Extremity/Trunk Assessment   Upper Extremity Assessment: Defer to OT evaluation           Lower Extremity Assessment: Generalized weakness;LLE deficits/detail;RLE deficits/detail   LLE Deficits / Details: Limited AROM DF (premorbid), and hip  flexion secondary to painf rom surgery.     Communication   Communication: No difficulties  Cognition Arousal/Alertness: Awake/alert Behavior During Therapy: WFL for tasks assessed/performed Overall Cognitive Status: Within Functional Limits for tasks  assessed                      General Comments General comments (skin integrity, edema, etc.): Daughter present towards end of evaluation.    Exercises Total Joint Exercises Ankle Circles/Pumps: Both;15 reps;Seated Long Arc Quad: Both;10 reps;Seated      Assessment/Plan    PT Assessment Patient needs continued PT services  PT Diagnosis Acute pain;Difficulty walking   PT Problem List Decreased strength;Pain;Decreased range of motion;Impaired sensation;Decreased activity tolerance;Decreased balance;Decreased mobility  PT Treatment Interventions Balance training;Gait training;Patient/family education;Functional mobility training;Therapeutic activities;Therapeutic exercise;Stair training;DME instruction   PT Goals (Current goals can be found in the Care Plan section) Acute Rehab PT Goals Patient Stated Goal: to return to independence and exercise PT Goal Formulation: With patient Time For Goal Achievement: 01/24/15 Potential to Achieve Goals: Good    Frequency BID   Barriers to discharge        Co-evaluation               End of Session Equipment Utilized During Treatment: Gait belt Activity Tolerance: Patient tolerated treatment well Patient left: in chair;with call bell/phone within reach;with family/visitor present Nurse Communication: Mobility status         Time: 9150-5697 PT Time Calculation (min) (ACUTE ONLY): 31 min   Charges:   PT Evaluation $Initial PT Evaluation Tier I: 1 Procedure PT Treatments $Therapeutic Activity: 8-22 mins   PT G CodesCandy Sledge A 01-15-15, 3:35 PM  Wray Kearns, [place], DPT [phone]

## 2015-01-10 NOTE — Interval H&P Note (Signed)
OK for surgery PD 

## 2015-01-10 NOTE — Anesthesia Preprocedure Evaluation (Addendum)
Anesthesia Evaluation  Patient identified by MRN, date of birth, ID band Patient awake    Reviewed: Allergy & Precautions, NPO status , Patient's Chart, lab work & pertinent test results  History of Anesthesia Complications Negative for: history of anesthetic complications  Airway Mallampati: II  TM Distance: >3 FB Neck ROM: Full    Dental   Pulmonary shortness of breath, former smoker,  breath sounds clear to auscultation        Cardiovascular hypertension, + angina + CAD, + Peripheral Vascular Disease and +CHF Rhythm:Regular     Neuro/Psych PSYCHIATRIC DISORDERS Anxiety Depression negative neurological ROS     GI/Hepatic negative GI ROS, Neg liver ROS,   Endo/Other  diabetes, Type 2  Renal/GU negative Renal ROS     Musculoskeletal  (+) Arthritis -,   Abdominal   Peds  Hematology negative hematology ROS (+)   Anesthesia Other Findings   Reproductive/Obstetrics                            Anesthesia Physical Anesthesia Plan  ASA: III  Anesthesia Plan: MAC and Spinal   Post-op Pain Management:    Induction: Intravenous  Airway Management Planned: Natural Airway  Additional Equipment: None  Intra-op Plan:   Post-operative Plan:   Informed Consent: I have reviewed the patients History and Physical, chart, labs and discussed the procedure including the risks, benefits and alternatives for the proposed anesthesia with the patient or authorized representative who has indicated his/her understanding and acceptance.   Dental advisory given  Plan Discussed with: CRNA and Surgeon  Anesthesia Plan Comments:        Anesthesia Quick Evaluation

## 2015-01-10 NOTE — Anesthesia Postprocedure Evaluation (Signed)
  Anesthesia Post-op Note  Patient: Patricia Villegas  Procedure(s) Performed: Procedure(s): TOTAL HIP ARTHROPLASTY ANTERIOR APPROACH (Left)  Patient Location: PACU  Anesthesia Type:Regional and Spinal  Level of Consciousness: awake and alert   Airway and Oxygen Therapy: Patient Spontanous Breathing  Post-op Pain: none  Post-op Assessment: Post-op Vital signs reviewed, Patient's Cardiovascular Status Stable, Respiratory Function Stable, Patent Airway, No signs of Nausea or vomiting and Pain level controlled  Post-op Vital Signs: Reviewed and stable  Last Vitals:  Filed Vitals:   01/10/15 1351  BP:   Pulse:   Temp: 36.6 C  Resp:     Complications: No apparent anesthesia complications

## 2015-01-10 NOTE — Anesthesia Procedure Notes (Signed)
Spinal Patient location during procedure: OR Staffing Anesthesiologist: Jaston Havens, CHRIS Preanesthetic Checklist Completed: patient identified, surgical consent, pre-op evaluation, timeout performed, IV checked, risks and benefits discussed and monitors and equipment checked Spinal Block Patient position: sitting Prep: site prepped and draped and DuraPrep Patient monitoring: heart rate, cardiac monitor, continuous pulse ox and blood pressure Approach: midline Location: L3-4 Injection technique: single-shot Needle Needle type: Pencan  Needle gauge: 24 G Needle length: 10 cm Assessment Sensory level: T8   

## 2015-01-11 ENCOUNTER — Encounter (HOSPITAL_COMMUNITY): Payer: Self-pay | Admitting: Orthopaedic Surgery

## 2015-01-11 LAB — BASIC METABOLIC PANEL
Anion gap: 8 (ref 5–15)
BUN: 13 mg/dL (ref 6–20)
CO2: 29 mmol/L (ref 22–32)
Calcium: 8.7 mg/dL — ABNORMAL LOW (ref 8.9–10.3)
Chloride: 104 mmol/L (ref 101–111)
Creatinine, Ser: 0.81 mg/dL (ref 0.44–1.00)
Glucose, Bld: 147 mg/dL — ABNORMAL HIGH (ref 70–99)
POTASSIUM: 4.2 mmol/L (ref 3.5–5.1)
Sodium: 141 mmol/L (ref 135–145)

## 2015-01-11 LAB — CBC
HCT: 29.1 % — ABNORMAL LOW (ref 36.0–46.0)
HEMOGLOBIN: 9.5 g/dL — AB (ref 12.0–15.0)
MCH: 31.3 pg (ref 26.0–34.0)
MCHC: 32.6 g/dL (ref 30.0–36.0)
MCV: 95.7 fL (ref 78.0–100.0)
Platelets: 204 10*3/uL (ref 150–400)
RBC: 3.04 MIL/uL — AB (ref 3.87–5.11)
RDW: 15.6 % — ABNORMAL HIGH (ref 11.5–15.5)
WBC: 11.6 10*3/uL — ABNORMAL HIGH (ref 4.0–10.5)

## 2015-01-11 LAB — GLUCOSE, CAPILLARY
GLUCOSE-CAPILLARY: 115 mg/dL — AB (ref 70–99)
Glucose-Capillary: 121 mg/dL — ABNORMAL HIGH (ref 70–99)
Glucose-Capillary: 207 mg/dL — ABNORMAL HIGH (ref 70–99)
Glucose-Capillary: 104 mg/dL — ABNORMAL HIGH (ref 70–99)

## 2015-01-11 NOTE — Progress Notes (Signed)
Subjective: 1 Day Post-Op Procedure(s) (LRB): TOTAL HIP ARTHROPLASTY ANTERIOR APPROACH (Left)   Patient sitting up in bed comfortably eating breakfast.  Activity level:  ok Diet tolerance:  ok Voiding:  ok Patient reports pain as mild and moderate.    Objective: Vital signs in last 24 hours: Temp:  [97.1 F (36.2 C)-98.7 F (37.1 C)] 98.2 F (36.8 C) (05/04 0504) Pulse Rate:  [53-76] 72 (05/04 0504) Resp:  [9-28] 20 (05/04 0504) BP: (98-150)/(36-67) 150/49 mmHg (05/04 0504) SpO2:  [95 %-100 %] 100 % (05/04 0504)  Labs:  Recent Labs  01/11/15 0600  HGB 9.5*    Recent Labs  01/11/15 0600  WBC 11.6*  RBC 3.04*  HCT 29.1*  PLT 204   No results for input(s): NA, K, CL, CO2, BUN, CREATININE, GLUCOSE, CALCIUM in the last 72 hours. No results for input(s): LABPT, INR in the last 72 hours.  Physical Exam:  Neurologically intact ABD soft Neurovascular intact Sensation intact distally Intact pulses distally Dorsiflexion/Plantar flexion intact Incision: dressing C/D/I and no drainage No cellulitis present Compartment soft  Assessment/Plan:  1 Day Post-Op Procedure(s) (LRB): TOTAL HIP ARTHROPLASTY ANTERIOR APPROACH (Left) Advance diet Up with therapy D/C IV fluids Plan for discharge tomorrow Discharge home with home health  Continue on ASA 325mg  BID x 4 weeks post op.  Follow up in office 2 weeks.  [place] Jaquith, Larwance Sachs 01/11/2015, 8:02 AM

## 2015-01-11 NOTE — Evaluation (Signed)
Occupational Therapy Evaluation Patient Details Name: Patricia Villegas MRN: [id number] DOB: 14-May-1935 Today's Date: 01/11/2015    History of Present Illness Patient is a 80 y/o female s/p L THA. PMH of HTN, [name], anxiety, depression, peripheral neuropathy, CAD s/p CABG, RA and lumbar disc disease s/p surgery.   Clinical Impression   PTA pt lived at home and was independent with use of SPC. Pt is moving at [place] guard level and requires assist for LB ADLs due to OT problems listed below. Pt will benefit from acute OT to progress to Supervision level to return home with family assist.     Follow Up Recommendations  No OT follow up;Supervision/Assistance - 24 hour    Equipment Recommendations  None recommended by OT    Recommendations for Other Services       Precautions / Restrictions Precautions Precautions: Fall Precaution Comments: Direct anterior approach. Restrictions Weight Bearing Restrictions: Yes LLE Weight Bearing: Weight bearing as tolerated      Mobility Bed Mobility Overal bed mobility: Needs Assistance Bed Mobility: Sit to Supine       Sit to supine: Min guard;HOB elevated   General bed mobility comments: use of bed rail, however pt able to bring LEs onto bed. Pt has rails on bed at home.  Transfers Overall transfer level: Needs assistance Equipment used: Rolling walker (2 wheeled) Transfers: Sit to/from Stand Sit to Stand: Min guard         General transfer comment: Min guard for safety. Cues for hand placement.         ADL Overall ADL's : Needs assistance/impaired Eating/Feeding: Independent;Sitting   Grooming: Min guard;Standing   Upper Body Bathing: Set up;Sitting   Lower Body Bathing: Minimal assistance;Sit to/from stand   Upper Body Dressing : Set up;Sitting   Lower Body Dressing: Minimal assistance;Sit to/from stand Lower Body Dressing Details (indicate cue type and reason): pt plans to wear dressing gowns; will have spouse to assist  with LB ADLs Toilet Transfer: Min guard;Ambulation;RW   Toileting- Water quality scientist and Hygiene: Min guard;Sit to/from stand       Functional mobility during ADLs: Min guard;Rolling walker       Vision Additional Comments: No change from baseline          Pertinent Vitals/Pain Pain Assessment: 0-10 Pain Score: 4  Pain Location: L hip Pain Descriptors / Indicators: Aching Pain Intervention(s): Monitored during session;Repositioned     Hand Dominance Right   Extremity/Trunk Assessment Upper Extremity Assessment Upper Extremity Assessment: Overall WFL for tasks assessed   Lower Extremity Assessment Lower Extremity Assessment: Defer to PT evaluation   Cervical / Trunk Assessment Cervical / Trunk Assessment: Normal   Communication Communication Communication: No difficulties   Cognition Arousal/Alertness: Awake/alert Behavior During Therapy: WFL for tasks assessed/performed Overall Cognitive Status: Within Functional Limits for tasks assessed                                Home Living Family/patient expects to be discharged to:: Private residence Living Arrangements: Spouse/significant other Available Help at Discharge: Family;Available 24 hours/day;Friend(s);Available PRN/intermittently Type of Home: House Home Access: Stairs to enter [employer] of Steps: 4 Entrance Stairs-Rails: Right Home Layout: Two level;Able to live on main level with bedroom/bathroom;Laundry or work area in basement Alternate [employer] of Steps: 1 flight to get to basement. Alternate Level Stairs-Rails: Right Bathroom Shower/Tub: Walk-in shower;Tub/shower unit Shower/tub characteristics: Architectural technologist: Standard  Home Equipment: Kasandra Knudsen - single point;Bedside commode;Walker - standard;Shower seat - built in          Prior Functioning/Environment Level of Independence: Independent with assistive device(s)        Comments: Pt using  SPC PTA for ~ 1 month.    OT Diagnosis: Generalized weakness;Acute pain   OT Problem List: Decreased strength;Decreased range of motion;Decreased activity tolerance;Impaired balance (sitting and/or standing);Pain   OT Treatment/Interventions: Self-care/ADL training;Therapeutic exercise;Energy conservation;DME and/or AE instruction;Therapeutic activities;Patient/family education;Balance training    OT Goals(Current goals can be found in the care plan section) Acute Rehab OT Goals Patient Stated Goal: to return to independence and exercise OT Goal Formulation: With patient Time For Goal Achievement: 01/18/15 Potential to Achieve Goals: Good ADL Goals Pt Will Perform Grooming: with set-up;with supervision;standing Pt Will Perform Lower Body Bathing: with set-up;with supervision;sit to/from stand Pt Will Perform Lower Body Dressing: with set-up;with supervision;sit to/from stand Pt Will Transfer to Toilet: with supervision;ambulating Pt Will Perform Toileting - Clothing Manipulation and hygiene: with supervision;sit to/from stand Pt Will Perform Tub/Shower Transfer: with supervision;ambulating;rolling walker  OT Frequency: Min 2X/week    End of Session Equipment Utilized During Treatment: Rolling walker  Activity Tolerance: Patient tolerated treatment well Patient left: in bed;with call bell/phone within reach   Time: 1145-1203 OT Time Calculation (min): 18 min Charges:  OT General Charges $OT Visit: 1 Procedure OT Evaluation $Initial OT Evaluation Tier I: 1 Procedure G-Codes:    Juluis Rainier 2015-01-26, 2:12 PM  Cyndie Chime, OTR/L Occupational Therapist [phone] (pager)

## 2015-01-11 NOTE — Progress Notes (Signed)
Physical Therapy Treatment Patient Details Name: Patricia Villegas MRN: [id number] DOB: 1935/05/22 Today's Date: 01/11/2015    History of Present Illness Patient is a 80 y/o female s/p L THA. PMH of HTN, [name], anxiety, depression, peripheral neuropathy, CAD s/p CABG, RA and lumbar disc disease s/p surgery.    PT Comments    Pt is making progress toward goals, although progress is slow due to fatigue. Pt would benefit from continued PT to increase functional independence and safety. Focus on progressive ambulation next session. Pt will need to practice stairs prior to D/C home. Continue with POC.  Follow Up Recommendations  Home health PT;Supervision/Assistance - 24 hour     Equipment Recommendations  Rolling walker with 5" wheels    Recommendations for Other Services       Precautions / Restrictions Precautions Precautions: Fall Precaution Comments: Direct anterior approach. Restrictions LLE Weight Bearing: Weight bearing as tolerated    Mobility  Bed Mobility Overal bed mobility: Needs Assistance Bed Mobility: Supine to Sit     Supine to sit: Min assist;HOB elevated     General bed mobility comments: Min A to bring LLE to EOB. Cues for technique.  Transfers Overall transfer level: Needs assistance Equipment used: Rolling walker (2 wheeled) Transfers: Sit to/from Stand Sit to Stand: Min guard         General transfer comment: Min guard for safety. Cues for hand placement.  Ambulation/Gait Ambulation/Gait assistance: Min guard Ambulation Distance (Feet): 50 Feet Assistive device: Rolling walker (2 wheeled) Gait Pattern/deviations: Step-to pattern;Decreased stance time - left;Antalgic   Gait velocity interpretation: Below normal speed for age/gender General Gait Details: Cues for gait sequence. Pt required standing rest break and c/o cramping/prickling sensation in feet/ankles during ambulation.   Stairs            Wheelchair Mobility    Modified Rankin  (Stroke Patients Only)       Balance                                    Cognition Arousal/Alertness: Awake/alert Behavior During Therapy: WFL for tasks assessed/performed Overall Cognitive Status: Within Functional Limits for tasks assessed                      Exercises Total Joint Exercises Ankle Circles/Pumps: AROM;Both;10 reps Quad Sets: AROM;Both;10 reps Heel Slides: AAROM;Left;5 reps Hip ABduction/ADduction: AAROM;Left;5 reps Long Arc Quad: AAROM;Both;10 reps    General Comments        Pertinent Vitals/Pain Pain Assessment: 0-10 Pain Score: 5  Pain Location: L hip and back Pain Descriptors / Indicators: Aching;Sore Pain Intervention(s): Monitored during session;Repositioned;Ice applied    Home Living                      Prior Function            PT Goals (current goals can now be found in the care plan section) Progress towards PT goals: Progressing toward goals    Frequency  BID    PT Plan Current plan remains appropriate    Co-evaluation             End of Session Equipment Utilized During Treatment: Gait belt Activity Tolerance: Patient limited by fatigue Patient left: in chair;with call bell/phone within reach     Time: 7829-5621 PT Time Calculation (min) (ACUTE ONLY): 34 min  Charges:  G CodesRubye Oaks, [place] 01/11/2015, 9:32 AM

## 2015-01-11 NOTE — Progress Notes (Signed)
Physical Therapy Treatment Patient Details Name: KARL KNARR MRN: [id number] DOB: 08-05-1935 Today's Date: 01/11/2015    History of Present Illness Patient is a 80 y/o female s/p L THA. PMH of HTN, [name], anxiety, depression, peripheral neuropathy, CAD s/p CABG, RA and lumbar disc disease s/p surgery.    PT Comments    Pt is making progress toward goals, although progress is slow due to pain. Pt would benefit from continued PT to increase functional independence and safety. Focus on progressive ambulation and stair training next session.  Follow Up Recommendations  Home health PT;Supervision/Assistance - 24 hour     Equipment Recommendations  Rolling walker with 5" wheels    Recommendations for Other Services       Precautions / Restrictions Precautions Precautions: Fall Precaution Comments: Direct anterior approach. Restrictions Weight Bearing Restrictions: Yes LLE Weight Bearing: Weight bearing as tolerated    Mobility  Bed Mobility Overal bed mobility: Needs Assistance Bed Mobility: Sit to Supine       Sit to supine: Min assist;HOB elevated   General bed mobility comments: Min A for LLE. Use of rails to push up.  Transfers Overall transfer level: Needs assistance Equipment used: Rolling walker (2 wheeled) Transfers: Sit to/from Stand Sit to Stand: Min guard         General transfer comment: Min guard for safety. Cues for hand placement.  Ambulation/Gait Ambulation/Gait assistance: Min guard Ambulation Distance (Feet): 70 Feet Assistive device: Rolling walker (2 wheeled) Gait Pattern/deviations: Step-through pattern;Decreased stance time - left;Antalgic   Gait velocity interpretation: Below normal speed for age/gender General Gait Details: Cues for position of RW and to keep RW moving. Able to progress to step-through pattern.   Stairs            Wheelchair Mobility    Modified Rankin (Stroke Patients Only)       Balance                                     Cognition Arousal/Alertness: Awake/alert Behavior During Therapy: WFL for tasks assessed/performed Overall Cognitive Status: Within Functional Limits for tasks assessed                      Exercises Total Joint Exercises Quad Sets: AROM;10 reps;Both Short Arc Quad: AAROM;Left;10 reps Heel Slides: AAROM;Left;10 reps Hip ABduction/ADduction: AAROM;Left;10 reps    General Comments        Pertinent Vitals/Pain Pain Assessment: 0-10 Pain Score: 5  Pain Location: L hip/anterior thigh Pain Descriptors / Indicators: Burning;Sore Pain Intervention(s): Monitored during session;Limited activity within patient's tolerance    Home Living Family/patient expects to be discharged to:: Private residence Living Arrangements: Spouse/significant other Available Help at Discharge: Family;Available 24 hours/day;Friend(s);Available PRN/intermittently Type of Home: House Home Access: Stairs to enter Entrance Stairs-Rails: Right Home Layout: Two level;Able to live on main level with bedroom/bathroom;Laundry or work area in [place]: Kasandra Knudsen - single point;Bedside commode;Walker - standard;Shower seat - built in      Prior Function Level of Independence: Independent with assistive device(s)      Comments: Pt using [facility] PTA for ~ 1 month.   PT Goals (current goals can now be found in the care plan section) Acute Rehab PT Goals Patient Stated Goal: to return to independence and exercise Progress towards PT goals: Progressing toward goals    Frequency  BID    PT Plan Current  plan remains appropriate    Co-evaluation             End of Session Equipment Utilized During Treatment: Gait belt Activity Tolerance: Patient limited by pain Patient left: in bed;with call bell/phone within reach     Time: 2707-8675 PT Time Calculation (min) (ACUTE ONLY): 27 min  Charges:                       G CodesRubye Oaks,  [place] 01/11/2015, 2:43 PM

## 2015-01-12 LAB — CBC
HCT: 27.6 % — ABNORMAL LOW (ref 36.0–46.0)
HEMOGLOBIN: 9 g/dL — AB (ref 12.0–15.0)
MCH: 31.3 pg (ref 26.0–34.0)
MCHC: 32.6 g/dL (ref 30.0–36.0)
MCV: 95.8 fL (ref 78.0–100.0)
Platelets: 170 10*3/uL (ref 150–400)
RBC: 2.88 MIL/uL — ABNORMAL LOW (ref 3.87–5.11)
RDW: 16.2 % — ABNORMAL HIGH (ref 11.5–15.5)
WBC: 10.6 10*3/uL — ABNORMAL HIGH (ref 4.0–10.5)

## 2015-01-12 LAB — GLUCOSE, CAPILLARY
GLUCOSE-CAPILLARY: 44 mg/dL — AB (ref 70–99)
Glucose-Capillary: 77 mg/dL (ref 70–99)
Glucose-Capillary: 161 mg/dL — ABNORMAL HIGH (ref 70–99)

## 2015-01-12 MED ORDER — ASPIRIN 325 MG PO TBEC: 325.0000 mg | DELAYED_RELEASE_TABLET | Freq: Two times a day (BID) | ORAL | Status: DC

## 2015-01-12 MED ORDER — HYDROCODONE-ACETAMINOPHEN 5-325 MG PO TABS: 1.0000 | ORAL_TABLET | ORAL | Status: DC | PRN

## 2015-01-12 MED ORDER — METHOCARBAMOL 500 MG PO TABS: 500.0000 mg | ORAL_TABLET | Freq: Four times a day (QID) | ORAL | Status: DC | PRN

## 2015-01-12 NOTE — Progress Notes (Signed)
Occupational Therapy Treatment Patient Details Name: Patricia Villegas MRN: [id number] DOB: 06/26/1935 Today's Date: 01/12/2015    History of present illness Patient is a 80 y/o female s/p L THA. PMH of HTN, [name], anxiety, depression, peripheral neuropathy, CAD s/p CABG, RA and lumbar disc disease s/p surgery.   OT comments  Completed education regarding compensatory techniques and use of AE/DME for ADL. Pt demostrated understanding. Pt safe to d/c home when medically stable.  Follow Up Recommendations  No OT follow up;Supervision/Assistance - 24 hour    Equipment Recommendations  3 in 1 bedside comode    Recommendations for Other Services      Precautions / Restrictions Precautions Precautions: Fall Precaution Comments: Direct anterior approach. Restrictions LLE Weight Bearing: Weight bearing as tolerated       Mobility Bed Mobility Overal bed mobility: Modified Independent             General bed mobility comments: Pt able to use gown to lift LLE onto bed. Use of rails to push up.  Transfers Overall transfer level: Modified independent Equipment used: Rolling walker (2 wheeled) Transfers: Sit to/from Stand Sit to Stand: Supervision         General transfer comment: Supervision for safety.    Balance             Standing balance-Leahy Scale: Fair                     ADL               Lower Body Bathing: Minimal assistance       Lower Body Dressing: Minimal assistance   Toilet Transfer: Modified Independent   Toileting- Clothing Manipulation and Hygiene: Modified independent       Functional mobility during ADLs: Modified independent General ADL Comments: Educated pt on shower trasnfer technique with use of 3 in 1                                      Cognition   Behavior During Therapy: WFL for tasks assessed/performed Overall Cognitive Status: Within Functional Limits for tasks assessed                                      Exercises Total Joint Exercises Ankle Circles/Pumps: AROM;Both;10 reps Quad Sets: AROM;Both;10 reps Short Arc Quad: AROM;Left;10 reps Heel Slides: AAROM;Left;10 reps   Shoulder Instructions       General Comments  Pt states her husband will be able to assist her with ADL as needed.     Pertinent Vitals/ Pain       Pain Assessment: No/denies pain  Home Living                                          Prior Functioning/Environment              Frequency       Progress Toward Goals  OT Goals(current goals can now be found in the care plan section)  Progress towards OT goals: Goals met/education completed, patient discharged from OT (adequate for D/C)  Acute Rehab OT Goals Patient Stated Goal: to return to independence and exercise OT Goal Formulation: With patient Time   For Goal Achievement: 01/18/15 Potential to Achieve Goals: Good ADL Goals Pt Will Perform Grooming: with set-up;with supervision;standing Pt Will Perform Lower Body Bathing: with set-up;with supervision;sit to/from stand Pt Will Perform Lower Body Dressing: with set-up;with supervision;sit to/from stand Pt Will Transfer to Toilet: with supervision;ambulating Pt Will Perform Toileting - Clothing Manipulation and hygiene: with supervision;sit to/from stand Pt Will Perform Tub/Shower Transfer: with supervision;ambulating;rolling walker  Plan Discharge plan remains appropriate;Equipment recommendations need to be updated    Co-evaluation                 End of Session Equipment Utilized During Treatment: Rolling walker   Activity Tolerance Patient tolerated treatment well   Patient Left in bed;with call bell/phone within reach   Nurse Communication Mobility status        Time: 1610-9604 OT Time Calculation (min): 18 min  Charges: OT General Charges $OT Visit: 1 Procedure OT Treatments $Self Care/Home Management : 8-22  mins  Jacari Iannello,[place] 01/12/2015, 2:21 PM   [facility], OTR/L  [phone] 01/12/2015

## 2015-01-12 NOTE — Discharge Summary (Signed)
Patient ID: Patricia Villegas MRN: [id number] DOB/AGE: 02-07-1935 80 y.o.  Admit date: 01/10/2015 Discharge date: 01/12/2015  Admission Diagnoses:  Principal Problem:   Primary osteoarthritis of left hip Active Problems:   Diabetes 1.5, managed as type 2   Discharge Diagnoses:  Same  Past Medical History  Diagnosis Date  . Diabetes mellitus     Greater than 20 years  . Hypertension     Greater than 20 years  . Hypercholesteremia   . [name] (peripheral vascular disease)     Stable claudication. Arteriogram 07/2009 no significant aortoiliac disease, has bilateral SFA occlusions with distal reconstitution   . Closed left fibular fracture     Casted   . Lumbar disc disease     S/p surgery x4;  Sciatica   . Anemia   . Blood transfusion   . Transfusion reaction 1960    Convulsions (? seizure)  . H/O hiatal hernia   . Parastomal hernia of ileal conduit 2009  . Arthritis   . Anxiety   . Depression   . Chest pain     a. Lexiscan Myoview 12/12:  EF 75%, no ischemia or scar;  b.  Echo 12/12:  EF 60-65%, no sig valvular abnormalities   . Shortness of breath     Hx: of with exertion  . Neuropathy due to secondary diabetes     Hx: of  . Complication of anesthesia 1998    Unable to arouse fully, respiratory depression, pt. reports that the family was told that she would need a trach., but then she stabilized & didn't need it   . Coronary artery disease     sees Dr Aundra Dubin every 6 months  . Pneumonia     s/p CABG  . Rheumatoid arthritis     Dr Berna Bue @ [facility]  . UTI (lower urinary tract infection) 01/03/15    Surgeries: Procedure(s): TOTAL HIP ARTHROPLASTY ANTERIOR APPROACH on 01/10/2015   Consultants:    Discharged Condition: Improved  Hospital Course: Patricia Villegas is an 80 y.o. female who was admitted 01/10/2015 for operative treatment ofPrimary osteoarthritis of left hip. Patient has severe unremitting pain that affects sleep, daily activities, and work/hobbies.  After pre-op clearance the patient was taken to the operating room on 01/10/2015 and underwent  Procedure(s): TOTAL HIP ARTHROPLASTY ANTERIOR APPROACH.    Patient was given perioperative antibiotics: Anti-infectives    Start     Dose/Rate Route Frequency Ordered Stop   01/10/15 1430  ceFAZolin (ANCEF) IVPB 2 g/50 mL premix     2 g 100 mL/hr over 30 Minutes Intravenous Every 6 hours 01/10/15 1428 01/10/15 2223   01/10/15 0600  ceFAZolin (ANCEF) IVPB 2 g/50 mL premix     2 g 100 mL/hr over 30 Minutes Intravenous On call to O.R. 01/09/15 1447 01/10/15 0813       Patient was given sequential compression devices, early ambulation, and chemoprophylaxis to prevent DVT.  Patient benefited maximally from hospital stay and there were no complications.    Recent vital signs: Patient Vitals for the past 24 hrs:  BP Temp Pulse Resp SpO2  01/12/15 0602 (!) 98/50 mmHg 98.2 F (36.8 C) 68 16 96 %  01/11/15 2033 (!) 93/50 mmHg 98.3 F (36.8 C) 61 16 96 %  01/11/15 1400 (!) 97/41 mmHg 98.9 F (37.2 C) 66 16 95 %     Recent laboratory studies:  Recent Labs  01/11/15 0600 01/12/15 0413  WBC 11.6* 10.6*  HGB 9.5* 9.0*  HCT 29.1* 27.6*  PLT 204 170  NA 141  --   K 4.2  --   CL 104  --   CO2 29  --   BUN 13  --   CREATININE 0.81  --   GLUCOSE 147*  --   CALCIUM 8.7*  --      Discharge Medications:     Medication List    TAKE these medications        acetaminophen 325 MG tablet  Commonly known as:  TYLENOL  Take 650 mg by mouth every 6 (six) hours as needed for mild pain or moderate pain.     Alogliptin Benzoate 25 MG Tabs  Take 25 mg by mouth daily.     aspirin 325 MG EC tablet  Take 1 tablet (325 mg total) by mouth 2 (two) times daily after a meal.     benazepril 20 MG tablet  Commonly known as:  LOTENSIN  Take 20 mg by mouth daily.     ciprofloxacin 500 MG tablet  Commonly known as:  CIPRO  Take 500 mg by mouth 2 (two) times daily.     folic acid 1 MG tablet   Commonly known as:  FOLVITE  Take 1 mg by mouth daily.     furosemide 40 MG tablet  Commonly known as:  LASIX  TAKE 1 TABLET BY MOUTH EVERY MORNING AND 1/2 TABLET BY MOUTH AT 4PM     glimepiride 4 MG tablet  Commonly known as:  AMARYL  Take 4 mg by mouth 2 (two) times daily.     HYDROcodone-acetaminophen 5-325 MG per tablet  Commonly known as:  NORCO/VICODIN  Take 1-2 tablets by mouth every 4 (four) hours as needed (breakthrough pain).     methocarbamol 500 MG tablet  Commonly known as:  ROBAXIN  Take 1 tablet (500 mg total) by mouth every 6 (six) hours as needed for muscle spasms.     methotrexate 2.5 MG tablet  Commonly known as:  RHEUMATREX  Take 25 mg by mouth once a week. Takes on Fridays     metoprolol tartrate 25 MG tablet  Commonly known as:  LOPRESSOR  TAKE 1/2 TABLET BY MOUTH 2 TIMES A DAY.     ONE TOUCH ULTRA TEST test strip  Generic drug:  glucose blood     potassium chloride 10 MEQ tablet  Commonly known as:  KLOR-CON 10  Take 1 tablet (10 mEq total) by mouth daily.     predniSONE 5 MG tablet  Commonly known as:  DELTASONE  Take 1 tablet by mouth daily.     simvastatin 80 MG tablet  Commonly known as:  ZOCOR  Take 80 mg by mouth at bedtime.     Vitamin D3 5000 UNITS Tabs  Take 5,000 Units by mouth at bedtime.        Diagnostic Studies: Dg Chest 2 View  12/29/2014   CLINICAL DATA:  Admission study prior to left total hip replacement; history of previous CABG, diabetes, currently asymptomatic.  EXAM: CHEST  2 VIEW  COMPARISON:  PA and lateral chest x-ray of July 19, 2013  FINDINGS: The lungs are mildly hyperinflated. The interstitial markings are coarse but stable. There is no alveolar infiltrate or pleural effusion. The cardiac silhouette is normal in size. There is dense mitral valvular annular calcification. There are 6 intact sternal wires. The thoracic vertebral bodies are preserved in height.  IMPRESSION: COPD and post CABG changes. There is no  active cardiopulmonary  disease.   Electronically Signed   By: David  Martinique M.D.   On: 12/29/2014 13:59   Dg Hip Operative Unilat With Pelvis Left  01/10/2015   CLINICAL DATA:  Left total hip joint replacement, anterior approach.  EXAM: OPERATIVE left HIP (WITH PELVIS IF PERFORMED) 2 VIEWS  TECHNIQUE: Fluoroscopic spot image(s) were submitted for interpretation post-operatively.  FLUOROSCOPY TIME:  Fluoroscopy Time:  0 min, 15 seconds  Number of Acquired Images:  To  COMPARISON:  None.  FINDINGS: A portable AP view centered over the pubic sent pubic symphysis and a lateral view centered over the left hip reveal the patient to of undergone total hip joint prosthesis placement. Radiographic positioning the prosthetic components is good. The interface with the native bone is good.  IMPRESSION: The patient has undergone left total hip arthroplasty without evidence of immediate postprocedure complication.   Electronically Signed   By: David  Martinique M.D.   On: 01/10/2015 10:41    Disposition: 01-Home or Self Care      Discharge Instructions    Call MD / Call 911    Complete by:  As directed   If you experience chest pain or shortness of breath, CALL 911 and be transported to the hospital emergency room.  If you develope a fever above 101 F, pus (white drainage) or increased drainage or redness at the wound, or calf pain, call your surgeon's office.     Constipation Prevention    Complete by:  As directed   Drink plenty of fluids.  Prune juice may be helpful.  You may use a stool softener, such as Colace (over the counter) 100 mg twice a day.  Use MiraLax (over the counter) for constipation as needed.     Diet - low sodium heart healthy    Complete by:  As directed      Discharge instructions    Complete by:  As directed   INSTRUCTIONS AFTER JOINT REPLACEMENT   Remove items at home which could result in a fall. This includes throw rugs or furniture in walking pathways ICE to the affected joint every  three hours while awake for 30 minutes at a time, for at least the first 3-5 days, and then as needed for pain and swelling.  Continue to use ice for pain and swelling. You may notice swelling that will progress down to the foot and ankle.  This is normal after surgery.  Elevate your leg when you are not up walking on it.   Continue to use the breathing machine you got in the hospital (incentive spirometer) which will help keep your temperature down.  It is common for your temperature to cycle up and down following surgery, especially at night when you are not up moving around and exerting yourself.  The breathing machine keeps your lungs expanded and your temperature down.   DIET:  As you were doing prior to hospitalization, we recommend a well-balanced diet.  DRESSING / WOUND CARE / SHOWERING  Keep dressing clean and dry until follow up.   ACTIVITY  Increase activity slowly as tolerated, but follow the weight bearing instructions below.   No driving for 6 weeks or until further direction given by your physician.  You cannot drive while taking narcotics.  No lifting or carrying greater than 10 lbs. until further directed by your surgeon. Avoid periods of inactivity such as sitting longer than an hour when not asleep. This helps prevent blood clots.  You may return to work once  you are authorized by your doctor.     WEIGHT BEARING   Weight bearing as tolerated with assist device (walker, cane, etc) as directed, use it as long as suggested by your surgeon or therapist, typically at least 4-6 weeks.   EXERCISES  Results after joint replacement surgery are often greatly improved when you follow the exercise, range of motion and muscle strengthening exercises prescribed by your doctor. Safety measures are also important to protect the joint from further injury. Any time any of these exercises cause you to have increased pain or swelling, decrease what you are doing until you are comfortable  again and then slowly increase them. If you have problems or questions, call your caregiver or physical therapist for advice.   Rehabilitation is important following a joint replacement. After just a few days of immobilization, the muscles of the leg can become weakened and shrink (atrophy).  These exercises are designed to build up the tone and strength of the thigh and leg muscles and to improve motion. Often times heat used for twenty to thirty minutes before working out will loosen up your tissues and help with improving the range of motion but do not use heat for the first two weeks following surgery (sometimes heat can increase post-operative swelling).   These exercises can be done on a training (exercise) mat, on the floor, on a table or on a bed. Use whatever works the best and is most comfortable for you.    Use music or television while you are exercising so that the exercises are a pleasant break in your day. This will make your life better with the exercises acting as a break in your routine that you can look forward to.   Perform all exercises about fifteen times, three times per day or as directed.  You should exercise both the operative leg and the other leg as well.   Exercises include:   Quad Sets - Tighten up the muscle on the front of the thigh (Quad) and hold for 5-10 seconds.   Straight Leg Raises - With your knee straight (if you were given a brace, keep it on), lift the leg to 60 degrees, hold for 3 seconds, and slowly lower the leg.  Perform this exercise against resistance later as your leg gets stronger.  Leg Slides: Lying on your back, slowly slide your foot toward your buttocks, bending your knee up off the floor (only go as far as is comfortable). Then slowly slide your foot back down until your leg is flat on the floor again.  Angel Wings: Lying on your back spread your legs to the side as far apart as you can without causing discomfort.  Hamstring Strength:  Lying on your  back, push your heel against the floor with your leg straight by tightening up the muscles of your buttocks.  Repeat, but this time bend your knee to a comfortable angle, and push your heel against the floor.  You may put a pillow under the heel to make it more comfortable if necessary.   A rehabilitation program following joint replacement surgery can speed recovery and prevent re-injury in the future due to weakened muscles. Contact your doctor or a physical therapist for more information on knee rehabilitation.    CONSTIPATION  Constipation is defined medically as fewer than three stools per week and severe constipation as less than one stool per week.  Even if you have a regular bowel pattern at home, your normal regimen is  likely to be disrupted due to multiple reasons following surgery.  Combination of anesthesia, postoperative narcotics, change in appetite and fluid intake all can affect your bowels.   YOU MUST use at least one of the following options; they are listed in order of increasing strength to get the job done.  They are all available over the counter, and you may need to use some, POSSIBLY even all of these options:    Drink plenty of fluids (prune juice may be helpful) and high fiber foods Colace 100 mg by mouth twice a day  Senokot for constipation as directed and as needed Dulcolax (bisacodyl), take with full glass of water  Miralax (polyethylene glycol) once or twice a day as needed.  If you have tried all these things and are unable to have a bowel movement in the first 3-4 days after surgery call either your surgeon or your primary doctor.    If you experience loose stools or diarrhea, hold the medications until you stool forms back up.  If your symptoms do not get better within 1 week or if they get worse, check with your doctor.  If you experience "the worst abdominal pain ever" or develop nausea or vomiting, please contact the office immediately for further  recommendations for treatment.   ITCHING:  If you experience itching with your medications, try taking only a single pain pill, or even half a pain pill at a time.  You can also use Benadryl over the counter for itching or also to help with sleep.   TED HOSE STOCKINGS:  Use stockings on both legs until for at least 2 weeks or as directed by physician office. They may be removed at night for sleeping.  MEDICATIONS:  See your medication summary on the "After Visit Summary" that nursing will review with you.  You may have some home medications which will be placed on hold until you complete the course of blood thinner medication.  It is important for you to complete the blood thinner medication as prescribed.  PRECAUTIONS:  If you experience chest pain or shortness of breath - call 911 immediately for transfer to the hospital emergency department.   If you develop a fever greater that 101 F, purulent drainage from wound, increased redness or drainage from wound, foul odor from the wound/dressing, or calf pain - CONTACT YOUR SURGEON.                                                   FOLLOW-UP APPOINTMENTS:  If you do not already have a post-op appointment, please call the office for an appointment to be seen by your surgeon.  Guidelines for how soon to be seen are listed in your "After Visit Summary", but are typically between 1-4 weeks after surgery.  OTHER INSTRUCTIONS:   Knee Replacement:  Do not place pillow under knee, focus on keeping the knee straight while resting. CPM instructions: 0-90 degrees, 2 hours in the morning, 2 hours in the afternoon, and 2 hours in the evening. Place foam block, curve side up under heel at all times except when in CPM or when walking.  DO NOT modify, tear, cut, or change the foam block in any way.  MAKE SURE YOU:  Understand these instructions.  Get help right away if you are not doing well or get worse.  Thank you for letting us be a part of your medical  care team.  It is a privilege we respect greatly.  We hope these instructions will help you stay on track for a fast and full recovery!     Increase activity slowly as tolerated    Complete by:  As directed            Follow-up Information    Follow up with Hessie Dibble, MD. Schedule an appointment as soon as possible for a visit in 2 weeks.   Specialty:  Orthopedic Surgery   Contact information:   [address] [place] [address] [phone]        Signed: Rich Fuchs 01/12/2015, 1:42 PM

## 2015-01-12 NOTE — Progress Notes (Signed)
Hypoglycemic Event  CBG: 44  Treatment: 15 GM carbohydrate snack  Symptoms: None  Follow-up CBG: [id number] CBG Result:77  Possible Reasons for Event: Unknown  Comments/MD notified:no    Patricia Villegas  Remember to initiate Hypoglycemia Order Set & complete

## 2015-01-12 NOTE — Progress Notes (Signed)
Physical Therapy Treatment Patient Details Name: Patricia Villegas MRN: [id number] DOB: [birth year] Today's Date: 01/12/2015    History of Present Illness Patient is a 80 y/o female s/p L THA. PMH of HTN, [name], anxiety, depression, peripheral neuropathy, CAD s/p CABG, RA and lumbar disc disease s/p surgery.    PT Comments    Pt able to progress ambulation and practice stairs this AM. Pt able to go up/down stairs with safe technique. Pt safe to D/C from a mobility standpoint based on progression toward goals set on PT eval. Plans to D/C later today.  Follow Up Recommendations  Home health PT;Supervision/Assistance - 24 hour     Equipment Recommendations  Rolling walker with 5" wheels    Recommendations for Other Services       Precautions / Restrictions Precautions Precautions: Fall Precaution Comments: Direct anterior approach. Restrictions LLE Weight Bearing: Weight bearing as tolerated    Mobility  Bed Mobility Overal bed mobility: Modified Independent Bed Mobility: Sit to Supine           General bed mobility comments: Pt able to use gown to lift LLE onto bed. Use of rails to push up.  Transfers Overall transfer level: Needs assistance Equipment used: Rolling walker (2 wheeled) Transfers: Sit to/from Stand Sit to Stand: Supervision         General transfer comment: Supervision for safety.  Ambulation/Gait Ambulation/Gait assistance: Min guard Ambulation Distance (Feet): 150 Feet Assistive device: Rolling walker (2 wheeled) Gait Pattern/deviations: Step-through pattern;Decreased stance time - left   Gait velocity interpretation: Below normal speed for age/gender General Gait Details: Min guard for safety.   Stairs Stairs: Yes Stairs assistance: Min guard Stair Management: One rail Right;Step to pattern;Sideways Number of Stairs: 5 General stair comments: Pt able to go up/down stairs with safe technique. Min guard for safety.  Wheelchair Mobility     Modified Rankin (Stroke Patients Only)       Balance                                    Cognition Arousal/Alertness: Awake/alert Behavior During Therapy: WFL for tasks assessed/performed Overall Cognitive Status: Within Functional Limits for tasks assessed                      Exercises Total Joint Exercises Ankle Circles/Pumps: AROM;Both;10 reps Quad Sets: AROM;Both;10 reps Short Arc Quad: AROM;Left;10 reps Heel Slides: AAROM;Left;10 reps    General Comments        Pertinent Vitals/Pain Pain Assessment: 0-10 Pain Score: 5  Pain Location: L hip Pain Descriptors / Indicators: Burning;Sore Pain Intervention(s): Monitored during session;Ice applied    Home Living                      Prior Function            PT Goals (current goals can now be found in the care plan section) Progress towards PT goals: Progressing toward goals    Frequency  BID    PT Plan Current plan remains appropriate    Co-evaluation             End of Session Equipment Utilized During Treatment: Gait belt Activity Tolerance: Patient tolerated treatment well Patient left: in bed;with call bell/phone within reach     Time: 8938-1017 PT Time Calculation (min) (ACUTE ONLY): 33 min  Charges:  G CodesRubye Oaks, [place] 01/12/2015, 10:58 AM

## 2015-02-14 ENCOUNTER — Other Ambulatory Visit: Payer: Self-pay | Admitting: Cardiology

## 2015-04-04 ENCOUNTER — Encounter: Payer: Self-pay | Admitting: *Deleted

## 2015-04-04 ENCOUNTER — Ambulatory Visit (INDEPENDENT_AMBULATORY_CARE_PROVIDER_SITE_OTHER): Payer: Medicare Other | Admitting: Cardiology

## 2015-04-04 VITALS — BP 148/64 | HR 56 | Ht 66.0 in | Wt 161.0 lb

## 2015-04-04 DIAGNOSIS — I5032 Chronic diastolic (congestive) heart failure: Secondary | ICD-10-CM | POA: Diagnosis not present

## 2015-04-04 DIAGNOSIS — I2510 Atherosclerotic heart disease of native coronary artery without angina pectoris: Secondary | ICD-10-CM | POA: Diagnosis not present

## 2015-04-04 DIAGNOSIS — I739 Peripheral vascular disease, unspecified: Secondary | ICD-10-CM | POA: Diagnosis not present

## 2015-04-04 MED ORDER — CILOSTAZOL 100 MG PO TABS: 100.0000 mg | ORAL_TABLET | Freq: Two times a day (BID) | ORAL | Status: DC

## 2015-04-04 NOTE — Patient Instructions (Addendum)
Medication Instructions:  Decrease aspirin to 81mg  daily--this will be just 1 tablet daily Start Pletal 100mg  (cilostazol)  two times a day  Labwork: None today  Testing/Procedures: Your physician has requested that you have a lower  extremity arterial duplex. This test is an ultrasound of the arteries in the legs. It looks at arterial blood flow in the legs . Allow one hour for Lower  Arterial scans. There are no restrictions or special instructions   Follow-Up: Your physician wants you to follow-up in: 6 months with Dr Aundra Dubin. (January 2017). You will receive a reminder letter in the mail two months in advance. If you don't receive a letter, please call our office to schedule the follow-up appointment.   Marland Kitchen

## 2015-04-05 ENCOUNTER — Encounter: Payer: Self-pay | Admitting: Cardiology

## 2015-04-05 NOTE — Progress Notes (Signed)
Patient ID: Patricia Villegas, female   DOB: Oct 30, 1934, 80 y.o.   MRN: [id number] PCP: Dr. Osborne Casco  80 yo with history of HTN, DM, hyperlipidemia, rheumatoid arthritis, PAD, and CAD s/p CABG presents for cardiology followup.  I initially saw her in 12/12.  She was admitted to the hospital at that time with generalized ill feeling, vague chest pain, and fever.  Initial point of care troponin was elevated but three subsequent sets of cardiac enzymes were normal.  She tested positive for Influenza A.  Myoview done that admission showed no evidence for ischemia or infarction.  She was seen again in 9/14 with exertional chest pain radiating to her neck.  LHC was done in 9/14, showing severe three vessel disease.  EF was preserved on echo.  Patient had CABG in 9/14 with Dr. Roxan Hockey.  She had trouble with volume overload after discharge and was diuresed with Lasix.    She had left THR in 5/16 which has helped a lot.  She has had no chest pain.  She is not short of breath walking on flat ground, mild dyspnea walking up steps.  She has, however, noted increased claudication for about a year. She has bilateral calf pain walking up inclines or after walking about 15 minutes on flat ground.     Labs (12/12): K 4.2, creatinine 0.9 Labs (9/14): K 4.3, creatinine 0.83 Labs (10/14): K 4.3, creatinine 0.8, BNP 413 Labs (11/14): LDL 50, HDL 34, K 3.7, creatinine 0.8, BNP 130 Labs (12/14): K 3.9, creatinine 0.9 Labs (1/15): K 4.1, creatinine 0.85 Labs (8/15): K 4.3, creatinine 0.89, hgb 11.5 Labs (9/15): LDL 86 HDL 53, K 3.5, creatinine 0.9, BNP 142 Labs (5/16): K 4.2, creatinine 0.81 Labs (6/16): creatinine 0.9, LDL 75, HDL 47  ECG: NSR, old ASMI, LAFB  PMH: 1. HTN 2. PAD: Followed by Dr. Scot Dock.  Arteriogram in 11/10 showed bilateral SFA occlusions with distal reconstitution.  3. Diabetes mellitus 4. Hyperlipidemia 5. L-spine surgery x 4.  Sciatica.  6. Hiatal hernia.  7. CAD: Lexiscan myoview (12/12)  with EF 75%, no ischemia or infarction.  Echo (12/12): EF 60-65%, no significant valvular abnormalities.  LHC (9/14) with 80% ostial LAD, 95% ostial LCx, 95% pRCA (nondominant).  Patient had CABG (9/14) with LIMA-LAD, seq SVG-OM1/PLOM, SVG-AM.  8. Rheumatoid arthritis: On prednisone and MTX 9. Diastolic CHF: Echo (2/24) with EF 60-65%, mild MR, PA systolic pressure 43 mmHg.  10. Left THR 5/16  FH: Brother with CABG.  Mother with MI at 32.   SH: Married, 2 children.  Quit smoking in 1998.  Lives in [place].   ROS: All systems reviewed and negative except as per HPI.   Current Outpatient Prescriptions  Medication Sig Dispense Refill  . acetaminophen (TYLENOL) 325 MG tablet Take 650 mg by mouth every 6 (six) hours as needed for mild pain or moderate pain.    . Alogliptin Benzoate 25 MG TABS Take 25 mg by mouth daily.    . benazepril (LOTENSIN) 20 MG tablet Take 20 mg by mouth daily.    . Cholecalciferol (VITAMIN D3) 5000 UNITS TABS Take 5,000 Units by mouth at bedtime.    . folic acid (FOLVITE) 1 MG tablet Take 1 mg by mouth daily.    . furosemide (LASIX) 40 MG tablet TAKE 1 TABLET BY MOUTH EVERY MORNING AND 1/2 TABLET BY MOUTH AT 4PM (Patient taking differently: TAKE 1 TABLET BY MOUTH DAILY) 45 tablet 3  . glimepiride (AMARYL) 4 MG tablet  Take 4 mg by mouth 2 (two) times daily.    . methotrexate (RHEUMATREX) 2.5 MG tablet Take 25 mg by mouth once a week. Takes on Fridays    . metoprolol tartrate (LOPRESSOR) 25 MG tablet TAKE 1/2 TABLET BY MOUTH 2 TIMES A DAY. 90 tablet 0  . ONE TOUCH ULTRA TEST test strip     . predniSONE (DELTASONE) 5 MG tablet Take 1 tablet by mouth daily.     . simvastatin (ZOCOR) 80 MG tablet Take 80 mg by mouth at bedtime.      Marland Kitchen aspirin EC 81 MG tablet Take 1 tablet (81 mg total) by mouth daily.    . cilostazol (PLETAL) 100 MG tablet Take 1 tablet (100 mg total) by mouth 2 (two) times daily. 180 tablet 1   No current facility-administered  medications for this visit.    BP 148/64 mmHg  Pulse 56  Ht 5\' 6"  (1.676 m)  Wt 161 lb (73.029 kg)  BMI 26.00 kg/m2 General: NAD Neck: JVP 7 cm, no thyromegaly or thyroid nodule.  Lungs: Slight decreased breath sounds left base.  CV: Nondisplaced PMI.  Heart regular S1/S2, +S4, 1/6 SEM RUSB.  Trace ankle edema bilaterally.  No carotid bruit.  Feet warm but unable to palpate pedal pulses.  No pedal ulcerations.  Abdomen: Soft, nontender, no hepatosplenomegaly, no distention.  Neurologic: Alert and oriented x 3.  Psych: Normal affect. Extremities: No clubbing or cyanosis.   Assessment/Plan: 1. CAD: s/p CABG.  Continue ASA, ACEI, metoprolol, simvastatin.   2. Hyperlipidemia: Patient is on Zocor 80 mg daily but has been on this statin dose long-term so will continue it.  6/16 lipids at goal. 3. Chronic diastolic CHF: NYHA class II symptoms.  She has minimal dyspnea.  Continue current Lasix.  4. PAD: Significant PAD known from the past with bilateral SFA occlusions. She has not been seen by Dr Scot Dock in a long time, says she was told there was "nothing he could do about it without surgery."  Claudication has worsened over the last year but no rest pain or ulcerations.  I will start her on cilostazol 100 mg bid and will have her get peripheral arterial dopplers.   5. HTN: SBP 130s-140s when she checks at home.  I will not change her meds today.    Loralie Champagne 04/05/2015

## 2015-04-14 ENCOUNTER — Ambulatory Visit (HOSPITAL_COMMUNITY)
Admission: RE | Admit: 2015-04-14 | Discharge: 2015-04-14 | Disposition: A | Discharge status: Home / Self Care | Payer: Medicare Other | Source: Ambulatory Visit | Attending: Cardiovascular Disease | Admitting: Cardiovascular Disease

## 2015-04-14 ENCOUNTER — Other Ambulatory Visit: Payer: Self-pay | Admitting: Cardiology

## 2015-04-14 DIAGNOSIS — I739 Peripheral vascular disease, unspecified: Secondary | ICD-10-CM | POA: Diagnosis not present

## 2015-04-14 DIAGNOSIS — E119 Type 2 diabetes mellitus without complications: Secondary | ICD-10-CM | POA: Insufficient documentation

## 2015-04-14 DIAGNOSIS — I779 Disorder of arteries and arterioles, unspecified: Secondary | ICD-10-CM | POA: Diagnosis not present

## 2015-04-14 DIAGNOSIS — I10 Essential (primary) hypertension: Secondary | ICD-10-CM | POA: Diagnosis not present

## 2015-04-14 DIAGNOSIS — I2510 Atherosclerotic heart disease of native coronary artery without angina pectoris: Secondary | ICD-10-CM | POA: Insufficient documentation

## 2015-04-14 DIAGNOSIS — E785 Hyperlipidemia, unspecified: Secondary | ICD-10-CM | POA: Insufficient documentation

## 2015-04-18 ENCOUNTER — Telehealth: Payer: Self-pay | Admitting: Cardiology

## 2015-04-18 NOTE — Telephone Encounter (Signed)
New message    Pt returning call regards results Please call to discuss

## 2015-04-18 NOTE — Telephone Encounter (Signed)
Contacted the pt to inform her that per Dr Aundra Dubin :  Notes Recorded by Larey Dresser, MD on 04/17/2015 at 2:58 PM Stable bilateral SFA occlusions. If cilostazol has not helped her claudication, refer to [facility] (Dr Fletcher Anon) for evaluation). Per the pt she states that since she started taking cilostazol, this has helped her claudication of her lower extremities.  Informed the pt that we will hold off on referring her to [facility] (Dr Fletcher Anon) for further eval, due to medication regimen helping with her claudication.  Informed the pt that I will update Dr Aundra Dubin of this.

## 2015-05-16 ENCOUNTER — Other Ambulatory Visit: Payer: Self-pay | Admitting: Cardiology

## 2015-09-02 IMAGING — CR DG CHEST 2V
2 series · 2 of 2 positions shown · non-contrast
Comparison: 08/23/2011.

CLINICAL DATA: 78-year-old female preoperative study for cardiac
surgery.

EXAM:
CHEST  2 VIEW

[w chest pa]
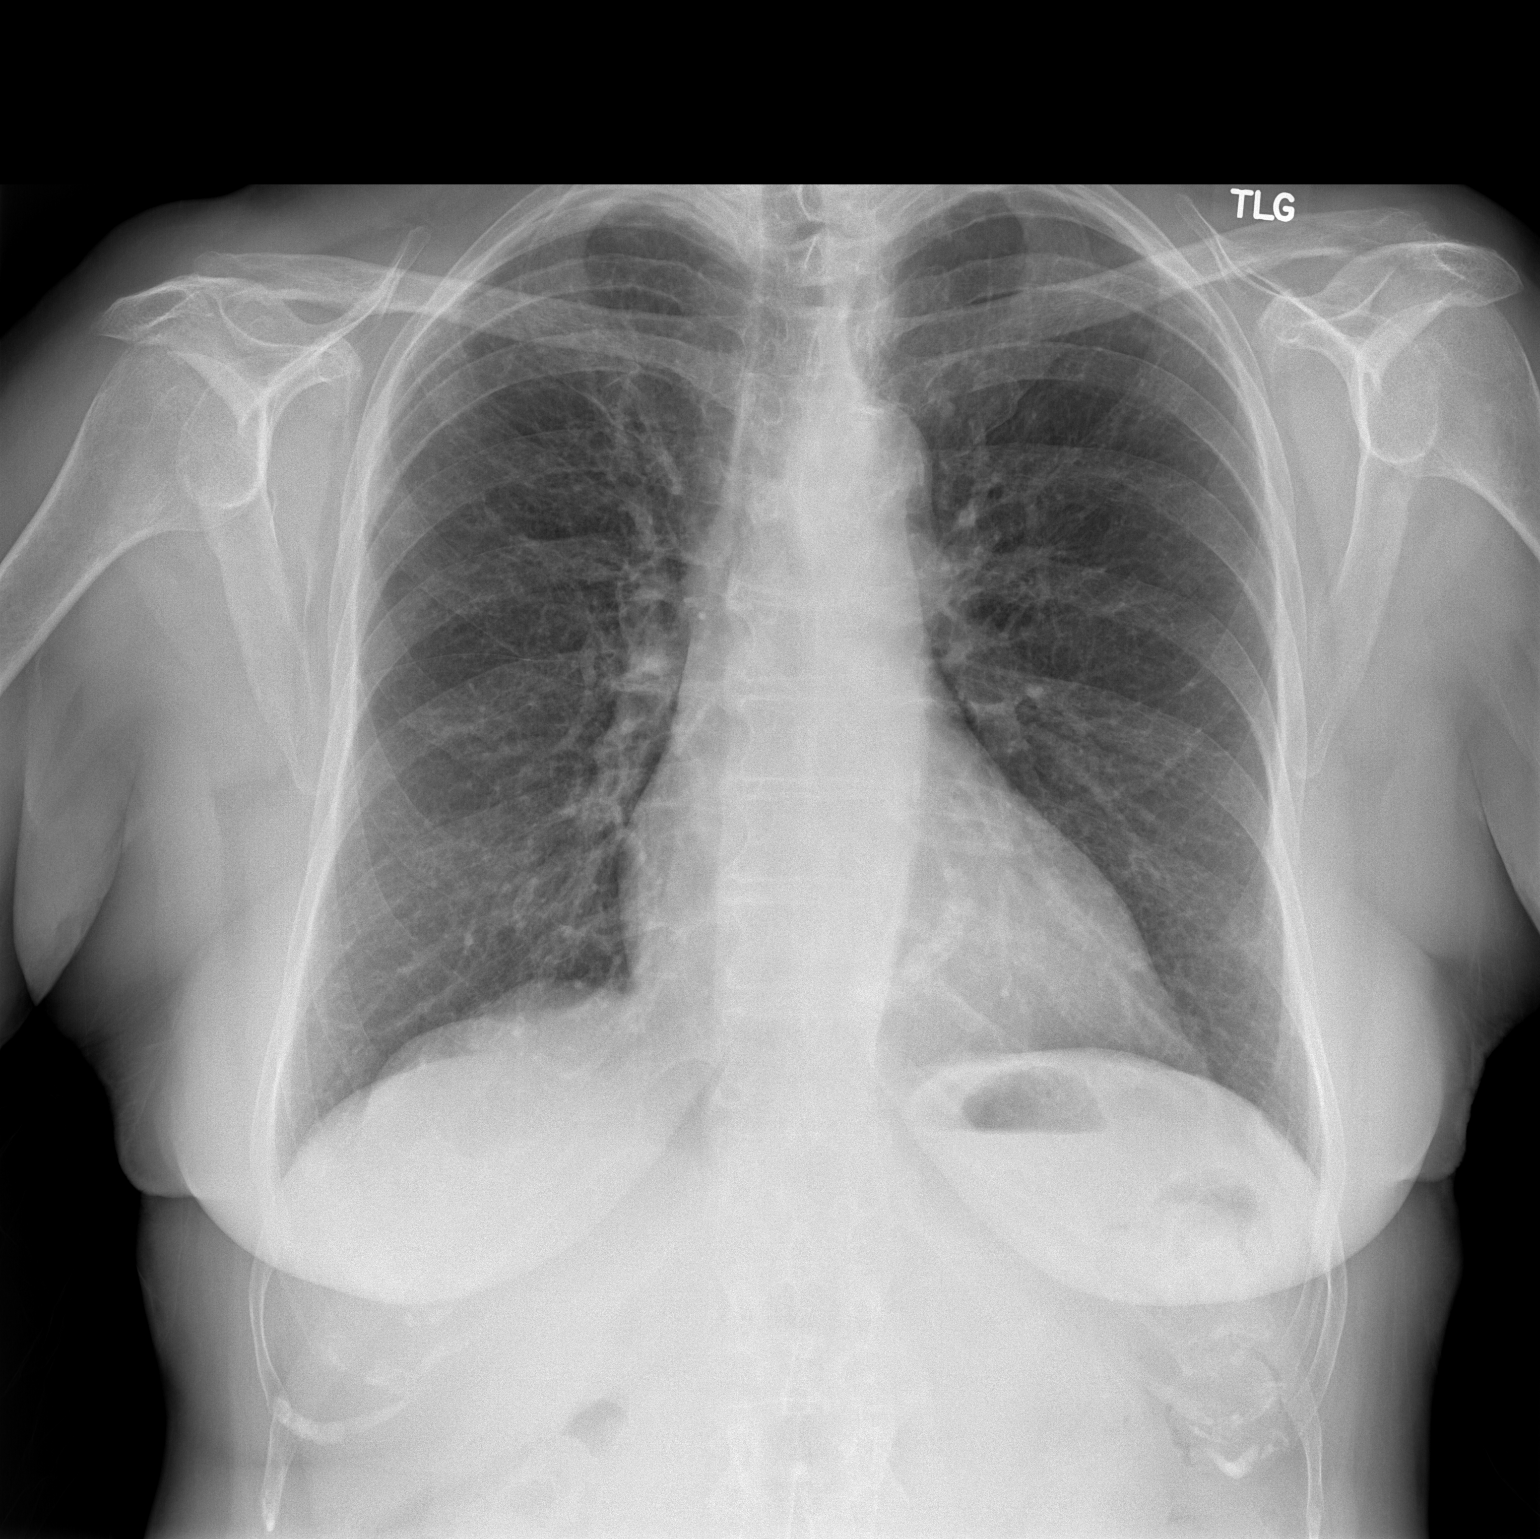

[w chest lat]
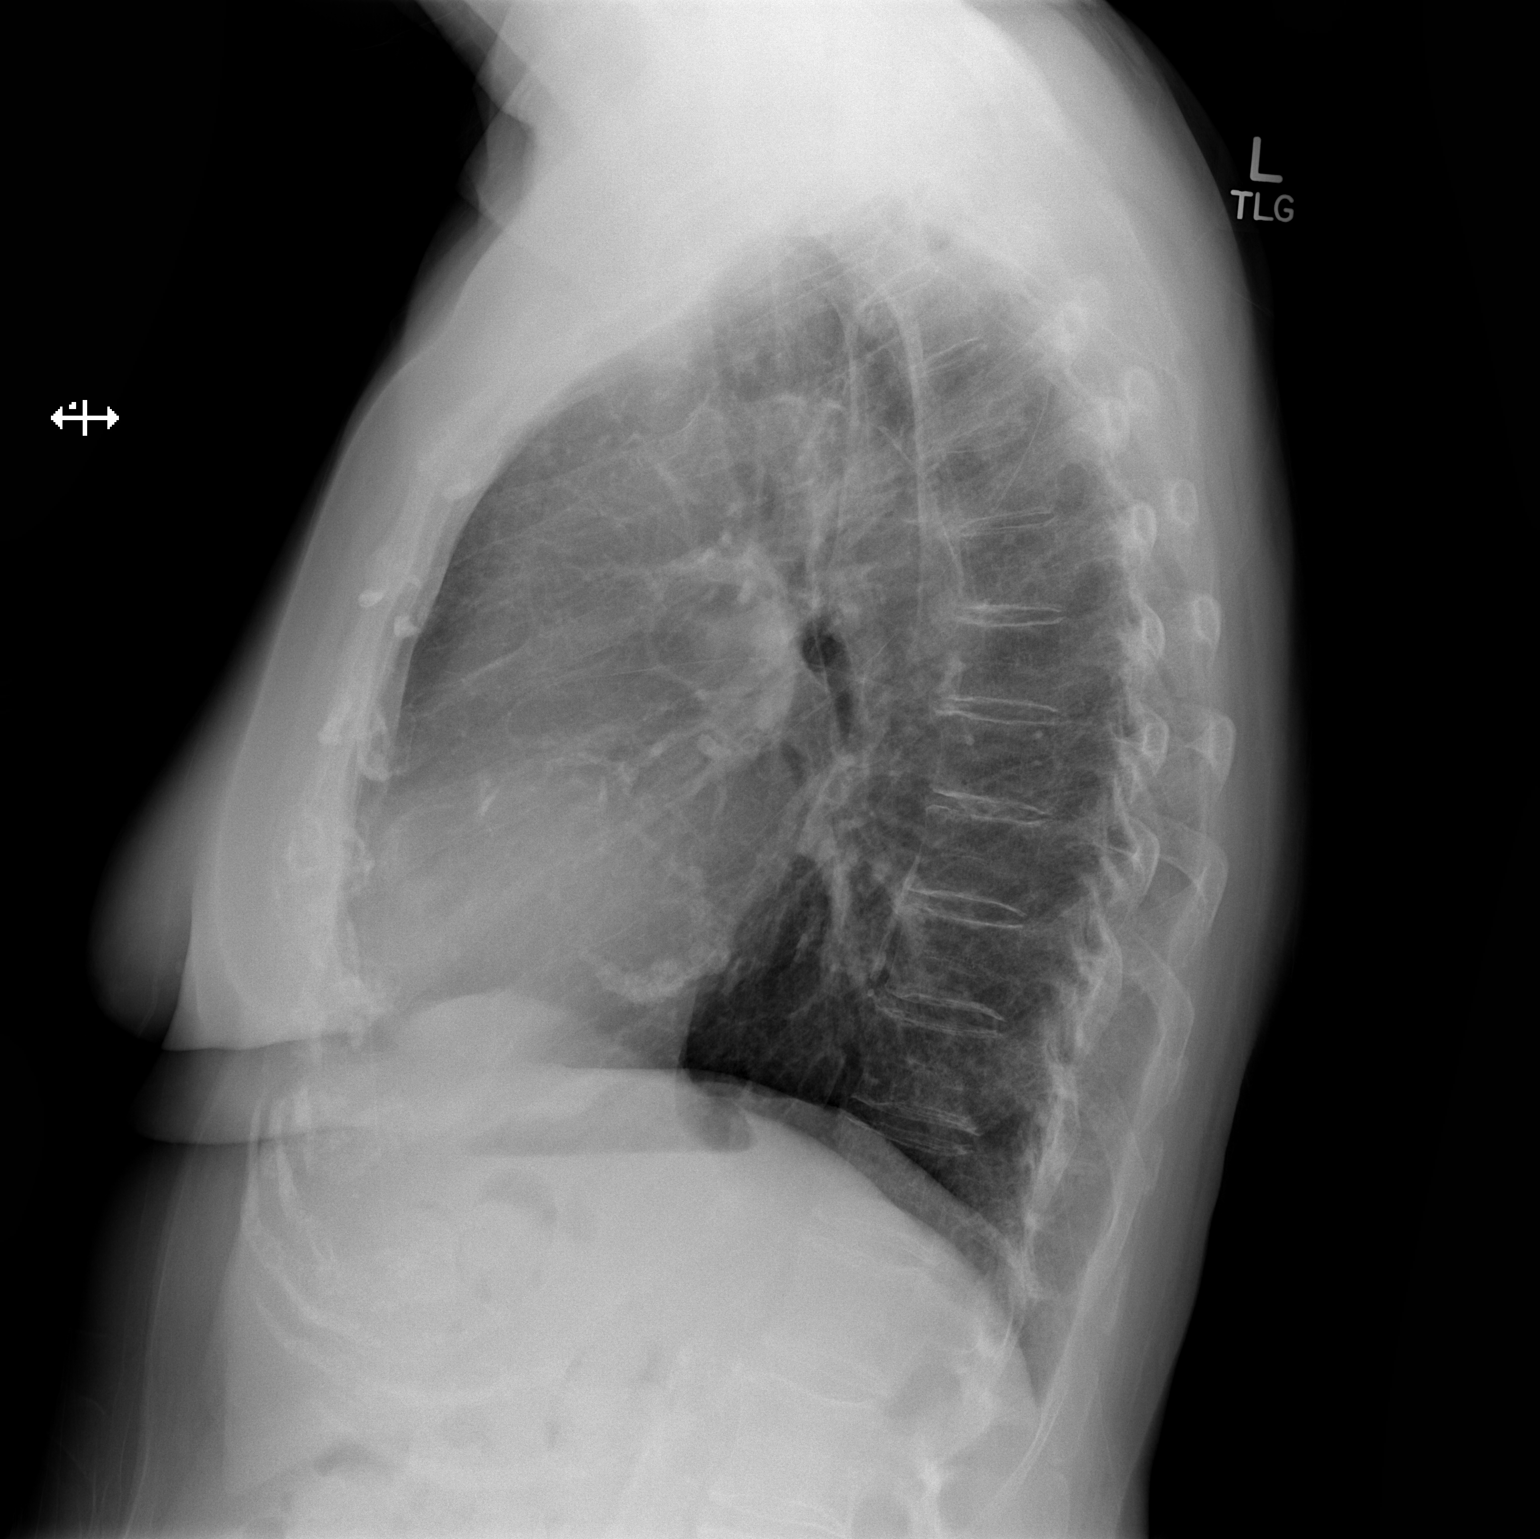

[2 of 2 positions shown; findings below may reference images not displayed]

FINDINGS: Lung volumes are stable and within normal limits. Mild eventration
of the right hemidiaphragm. Stable cardiac size and mediastinal
contours. Mitral annulus calcification. Visualized tracheal air
column is within normal limits. No pneumothorax, pulmonary edema,
pleural effusion or confluent pulmonary opacity. Calcified
atherosclerosis of the aorta. Osteopenia.
IMPRESSION: No acute cardiopulmonary abnormality.

## 2015-10-04 ENCOUNTER — Other Ambulatory Visit: Payer: Self-pay | Admitting: Cardiology

## 2015-10-07 ENCOUNTER — Other Ambulatory Visit: Payer: Self-pay | Admitting: Cardiology

## 2016-02-20 ENCOUNTER — Encounter: Payer: Self-pay | Admitting: Cardiology

## 2016-03-08 ENCOUNTER — Encounter: Payer: Self-pay | Admitting: Cardiology

## 2016-03-20 ENCOUNTER — Encounter: Payer: Self-pay | Admitting: Cardiology

## 2016-03-20 ENCOUNTER — Ambulatory Visit (INDEPENDENT_AMBULATORY_CARE_PROVIDER_SITE_OTHER): Payer: Medicare Other | Admitting: Cardiology

## 2016-03-20 ENCOUNTER — Encounter: Payer: Self-pay | Admitting: *Deleted

## 2016-03-20 VITALS — BP 124/64 | HR 65 | Ht 66.0 in | Wt 160.0 lb

## 2016-03-20 DIAGNOSIS — I739 Peripheral vascular disease, unspecified: Secondary | ICD-10-CM

## 2016-03-20 DIAGNOSIS — I2510 Atherosclerotic heart disease of native coronary artery without angina pectoris: Secondary | ICD-10-CM | POA: Diagnosis not present

## 2016-03-20 DIAGNOSIS — R0989 Other specified symptoms and signs involving the circulatory and respiratory systems: Secondary | ICD-10-CM

## 2016-03-20 NOTE — Patient Instructions (Signed)
Medication Instructions:  Your physician recommends that you continue on your current medications as directed. Please refer to the Current Medication list given to you today.   Labwork: None   Testing/Procedures: Your physician has requested that you have a carotid duplex. This test is an ultrasound of the carotid arteries in your neck. It looks at blood flow through these arteries that supply the brain with blood. Allow one hour for this exam. There are no restrictions or special instructions.  Your physician has requested that you have a lower or upper extremity arterial duplex. This test is an ultrasound of the arteries in the legs or arms. It looks at arterial blood flow in the legs and arms. Allow one hour for Lower and Upper Arterial scans. There are no restrictions or special instructions   Follow-Up: Your physician wants you to follow-up in: 1 year with Dr Aundra Dubin. (July 2018). You will receive a reminder letter in the mail two months in advance. If you don't receive a letter, please call our office to schedule the follow-up appointment.        If you need a refill on your cardiac medications before your next appointment, please call your pharmacy.

## 2016-03-21 DIAGNOSIS — R0989 Other specified symptoms and signs involving the circulatory and respiratory systems: Secondary | ICD-10-CM | POA: Insufficient documentation

## 2016-03-21 NOTE — Progress Notes (Signed)
Patient ID: Patricia Villegas, female   DOB: Nov 06, 1934, 81 y.o.   MRN: [id number] PCP: Dr. Osborne Casco  81 yo with history of HTN, DM, hyperlipidemia, rheumatoid arthritis, PAD, and CAD s/p CABG presents for cardiology followup.  I initially saw her in 12/12.  She was admitted to the hospital at that time with generalized ill feeling, vague chest pain, and fever.  Initial point of care troponin was elevated but three subsequent sets of cardiac enzymes were normal.  She tested positive for Influenza A.  Myoview done that admission showed no evidence for ischemia or infarction.  She was seen again in 9/14 with exertional chest pain radiating to her neck.  LHC was done in 9/14, showing severe three vessel disease.  EF was preserved on echo.  Patient had CABG in 9/14 with Dr. Roxan Hockey.  She had trouble with volume overload after discharge and was diuresed with Lasix.    She has had no chest pain.  She is not short of breath walking on flat ground or up a flight of steps.  Claudication much improved on cilostazol.  She is more active. She gets calf pain walking up inclines or walking around 100 yards.   ECG: NSR, old ASMI  Labs (12/12): K 4.2, creatinine 0.9 Labs (9/14): K 4.3, creatinine 0.83 Labs (10/14): K 4.3, creatinine 0.8, BNP 413 Labs (11/14): LDL 50, HDL 34, K 3.7, creatinine 0.8, BNP 130 Labs (12/14): K 3.9, creatinine 0.9 Labs (1/15): K 4.1, creatinine 0.85 Labs (8/15): K 4.3, creatinine 0.89, hgb 11.5 Labs (9/15): LDL 86 HDL 53, K 3.5, creatinine 0.9, BNP 142 Labs (5/16): K 4.2, creatinine 0.81 Labs (6/16): creatinine 0.9, LDL 75, HDL 47 Labs (6/17): K 4.3, creatinine 1.0, LFTs normal, HCT 31.1, LDL 76  ECG: NSR, old ASMI, LAFB  PMH: 1. HTN 2. PAD: Followed by Dr. Scot Dock.  Arteriogram in 11/10 showed bilateral SFA occlusions with distal reconstitution.  - Peripheral arterial dopplers (8/16) with stable bilateral SFA occlusions. 3. Diabetes mellitus 4. Hyperlipidemia 5. L-spine surgery  x 4.  Sciatica.  6. Hiatal hernia.  7. CAD: Lexiscan myoview (12/12) with EF 75%, no ischemia or infarction.  Echo (12/12): EF 60-65%, no significant valvular abnormalities.  LHC (9/14) with 80% ostial LAD, 95% ostial LCx, 95% pRCA (nondominant).  Patient had CABG (9/14) with LIMA-LAD, seq SVG-OM1/PLOM, SVG-AM.  8. Rheumatoid arthritis: On prednisone and MTX 9. Diastolic CHF: Echo (123456) with EF 60-65%, mild MR, PA systolic pressure 43 mmHg.  10. Left THR 5/16  FH: Brother with CABG.  Mother with MI at 53.   SH: Married, 2 children.  Quit smoking in 1998.  Lives in [place].   ROS: All systems reviewed and negative except as per HPI.   Current Outpatient Prescriptions  Medication Sig Dispense Refill  . acetaminophen (TYLENOL) 325 MG tablet Take 650 mg by mouth every 6 (six) hours as needed for mild pain or moderate pain.    Marland Kitchen aspirin EC 81 MG tablet Take 1 tablet (81 mg total) by mouth daily.    . benazepril (LOTENSIN) 20 MG tablet Take 20 mg by mouth daily.    . cilostazol (PLETAL) 100 MG tablet TAKE 1 TABLET BY MOUTH 2 TIMES DAILY. 99991111 tablet 1  . folic acid (FOLVITE) 1 MG tablet Take 1 mg by mouth daily.    . furosemide (LASIX) 20 MG tablet Take 40 mg by mouth daily.  2  . glimepiride (AMARYL) 4 MG tablet Take 4 mg by mouth  2 (two) times daily.    Marland Kitchen JANUVIA 25 MG tablet Take 1 tablet by mouth daily.  4  . methotrexate (RHEUMATREX) 2.5 MG tablet Take 25 mg by mouth once a week. Takes on Fridays    . metoprolol tartrate (LOPRESSOR) 25 MG tablet TAKE 1/2 TABLET BY MOUTH 2 TIMES A DAY. 90 tablet 2  . ONE TOUCH ULTRA TEST test strip     . predniSONE (DELTASONE) 1 MG tablet Take 2 mg by mouth daily with breakfast.    . simvastatin (ZOCOR) 80 MG tablet Take 80 mg by mouth at bedtime.       No current facility-administered medications for this visit.    BP 124/64 mmHg  Pulse 65  Ht 5\' 6"  (1.676 m)  Wt 160 lb (72.576 kg)  BMI 25.84 kg/m2 General: NAD Neck: JVP 7  cm, no thyromegaly or thyroid nodule. Left carotid bruit.  Lungs: CTAB.  CV: Nondisplaced PMI.  Heart regular S1/S2, +S4, 1/6 SEM RUSB.  No edema.  No carotid bruit.  Feet warm but unable to palpate pedal pulses.  No pedal ulcerations.  Abdomen: Soft, nontender, no hepatosplenomegaly, no distention.  Neurologic: Alert and oriented x 3.  Psych: Normal affect. Extremities: No clubbing or cyanosis.   Assessment/Plan: 1. CAD: s/p CABG.  Continue ASA, ACEI, metoprolol, simvastatin.  No chest pain.  2. Hyperlipidemia: Patient is on Zocor 80 mg daily but has been on this statin dose long-term so will continue it.  6/17 lipids at goal. 3. Chronic diastolic CHF: NYHA class II symptoms.  She has minimal dyspnea.  Continue current Lasix.  4. PAD: Significant PAD with bilateral SFA occlusions. Cilostazol has helped her symptoms considerably, will continue.   - Repeat peripheral arterial dopplers 8/17.  5. HTN: BP stable.  6. Carotid bruit: I will check carotid dopplers.     Followup in 1 year.   Loralie Champagne 03/21/2016

## 2016-05-15 ENCOUNTER — Other Ambulatory Visit: Payer: Self-pay | Admitting: Cardiology

## 2017-08-13 ENCOUNTER — Other Ambulatory Visit: Payer: Self-pay | Admitting: Cardiology

## 2017-09-18 NOTE — Progress Notes (Signed)
Cardiology Office Note    Date:  09/19/2017   ID:  Patricia Villegas, DOB 1935/05/26, MRN [id number]  PCP:  Haywood Pao, MD  Cardiologist: Sinclair Grooms, MD   Chief Complaint  Patient presents with  . Coronary Artery Disease    History of Present Illness:  Patricia Villegas is a 83 y.o. female history of HTN, DM, hyperlipidemia, rheumatoid arthritis, PAD, and CAD s/p CABG with LIMA to LAD, SVG to acute marginal, SVG sequential to the obtuse marginal and P in 2014, presents for cardiology followup and to establish longitudinal care.  Initial presentation was with "indigestion" that was also associated with tightness in the jaw was and neck.  Evaluation demonstrated severe three-vessel coronary disease and led to coronary bypass grafting.  She has had no recurrence of symptoms since that time.  She denies lower extremity swelling, palpitations, exertional fatigue, orthopnea, and edema.  She was previously cared for by Dr. Loralie Champagne.  She is here to establish because she ran out of metoprolol and has not had it refilled.    Past Medical History:  Diagnosis Date  . Anemia   . Anxiety   . Arthritis   . Blood transfusion   . Chest pain    a. Lexiscan Myoview 12/12:  EF 75%, no ischemia or scar;  b.  Echo 12/12:  EF 60-65%, no sig valvular abnormalities   . Closed left fibular fracture    Casted   . Complication of anesthesia 1998   Unable to arouse fully, respiratory depression, pt. reports that the family was told that she would need a trach., but then she stabilized & didn't need it   . Coronary artery disease    sees Dr Aundra Dubin every 6 months  . Depression   . Diabetes mellitus    Greater than 20 years  . H/O hiatal hernia   . Hypercholesteremia   . Hypertension    Greater than 20 years  . Lumbar disc disease    S/p surgery x4;  Sciatica   . Neuropathy due to secondary diabetes ([place])    Hx: of  . Parastomal hernia of ileal conduit ([place]) 2009  . Pneumonia    s/p  CABG  . [name] (peripheral vascular disease) ([facility])    Stable claudication. Arteriogram 07/2009 no significant aortoiliac disease, has bilateral SFA occlusions with distal reconstitution   . Rheumatoid arthritis ([facility])    Dr Berna Bue @ [place] of breath    Hx: of with exertion  . Transfusion reaction 1960   Convulsions (? seizure)  . UTI (lower urinary tract infection) 01/03/15    Past Surgical History:  Procedure Laterality Date  . ABDOMINAL HYSTERECTOMY    . BACK SURGERY     4 back operations most recently 1995, 1998  . CARDIAC CATHETERIZATION     Hx: of 05/14/13  . CATARACT EXTRACTION W/ INTRAOCULAR LENS  IMPLANT, BILATERAL     Hx: of  . COLONOSCOPY     Hx; of  . CORONARY ARTERY BYPASS GRAFT N/A 05/24/2013   Procedure: CORONARY ARTERY BYPASS GRAFTING (CABG);  Surgeon: Melrose Nakayama, MD;  Location: [facility];  Service: Open Heart Surgery;  Laterality: N/A;  . DILATION AND CURETTAGE OF UTERUS    . HERNIA REPAIR  2009  . LEFT HEART CATHETERIZATION WITH CORONARY ANGIOGRAM N/A 05/14/2013   Procedure: LEFT HEART CATHETERIZATION WITH CORONARY ANGIOGRAM;  Surgeon: Larey Dresser, MD;  Location: [facility] CATH LAB;  Service: Cardiovascular;  Laterality: N/A;  . TONSILLECTOMY    . TOTAL HIP ARTHROPLASTY Left 01/10/2015   Procedure: TOTAL HIP ARTHROPLASTY ANTERIOR APPROACH;  Surgeon: Melrose Nakayama, MD;  Location: [facility];  Service: Orthopedics;  Laterality: Left;    Current Medications: Outpatient Medications Prior to Visit  Medication Sig Dispense Refill  . allopurinol (ZYLOPRIM) 100 MG tablet Take 100 mg by mouth 2 (two) times daily.  5  . aspirin EC 81 MG tablet Take 1 tablet (81 mg total) by mouth daily.    . benazepril (LOTENSIN) 20 MG tablet Take 20 mg by mouth daily.    . folic acid (FOLVITE) 1 MG tablet Take 1 mg by mouth daily.    . furosemide (LASIX) 20 MG tablet Take 40 mg by mouth daily.  2  . hydroxychloroquine (PLAQUENIL) 200 MG tablet Take 200 mg by mouth 2  (two) times daily.  2  . LANTUS SOLOSTAR 100 UNIT/ML Solostar Pen 30 Units by Subconjunctival route daily.  6  . methotrexate (RHEUMATREX) 2.5 MG tablet Take 25 mg by mouth once a week. Takes on Fridays    . ONE TOUCH ULTRA TEST test strip     . predniSONE (DELTASONE) 1 MG tablet Take 2 mg by mouth daily with breakfast.    . simvastatin (ZOCOR) 80 MG tablet Take 80 mg by mouth at bedtime.      Marland Kitchen acetaminophen (TYLENOL) 325 MG tablet Take 650 mg by mouth every 6 (six) hours as needed for mild pain or moderate pain.    . cilostazol (PLETAL) 100 MG tablet TAKE 1 TABLET BY MOUTH 2 TIMES DAILY. (Patient not taking: Reported on 09/19/2017) 180 tablet 1  . glimepiride (AMARYL) 4 MG tablet Take 4 mg by mouth 2 (two) times daily.    Marland Kitchen JANUVIA 25 MG tablet Take 1 tablet by mouth daily.  4  . metoprolol tartrate (LOPRESSOR) 25 MG tablet TAKE 1/2 TABLET BY MOUTH 2 TIMES A DAY. (Patient not taking: Reported on 09/19/2017) 90 tablet 3   No facility-administered medications prior to visit.      Allergies:   Patient has no known allergies.   Social History   Socioeconomic History  . Marital status: Married    Spouse name: None  . Number of children: 2  . Years of education: None  . Highest education level: None  Social Needs  . Financial resource strain: None  . Food insecurity - worry: None  . Food insecurity - inability: None  . Transportation needs - medical: None  . Transportation needs - non-medical: None  Occupational History  . Occupation: Retired    Fish farm manager: RETIRED  Tobacco Use  . Smoking status: Former Smoker    Packs/day: 0.50    Years: 10.00    Pack years: 5.00    Types: Cigarettes    Last attempt to quit: 09/09/1996    Years since quitting: 21.0  . Smokeless tobacco: Former Systems developer    Types: Chew  Substance and Sexual Activity  . Alcohol use: No  . Drug use: No  . Sexual activity: Not Currently  Other Topics Concern  . None  Social History Narrative   Married and has two  children      Family History:  The patient's family history includes Cancer in her brother and sister; Coronary artery disease (age of onset: 15) in her mother; Diabetes in her brother and mother; Heart attack in her mother; Heart disease in her brother; Kidney disease in her brother; Stroke  in her brother and mother.   ROS:   Please see the history of present illness.    Some shortness of breath with activity, back pain, rash, easy bruising, joint swelling, difficulty with balance, anxiety, constipation, and leg pain. All other systems reviewed and are negative.   PHYSICAL EXAM:   VS:  BP (!) 142/90   Pulse 75   Ht 5\' 6"  (1.676 m)   Wt 160 lb 3.2 oz (72.7 kg)   BMI 25.86 kg/m    GEN: Well nourished, well developed, in no acute distress  HEENT: normal  Neck: no JVD, carotid bruits, or masses Cardiac: RRR; no murmurs, rubs, or gallops,no edema  Respiratory:  clear to auscultation bilaterally, normal work of breathing GI: soft, nontender, nondistended, + BS MS: no deformity or atrophy  Skin: warm and dry, no rash Neuro:  Alert and Oriented x 3, Strength and sensation are intact Psych: euthymic mood, full affect  Wt Readings from Last 3 Encounters:  09/19/17 160 lb 3.2 oz (72.7 kg)  03/20/16 160 lb (72.6 kg)  04/04/15 161 lb (73 kg)      Studies/Labs Reviewed:   EKG:  EKG normal sinus rhythm, left anterior hemiblock, poor R wave progression.  No change when compared to prior.  Recent Labs: No results found for requested labs within last 8760 hours.   Lipid Panel    Component Value Date/Time   CHOL 166 05/13/2014 0932   TRIG 140.0 05/13/2014 0932   HDL 52.50 05/13/2014 0932   CHOLHDL 3 05/13/2014 0932   VLDL 28.0 05/13/2014 0932   LDLCALC 86 05/13/2014 0932    Additional studies/ records that were reviewed today include:  No new data.  Last known ejection fraction was normal.  Echocardiogram from 2014: Study Conclusions  - Left ventricle: The cavity size was  normal. Wall thickness   was increased in a pattern of moderate LVH. There was mild   concentric hypertrophy. Systolic function was normal. The   estimated ejection fraction was in the range of 60% to   65%. Wall motion was normal; there were no regional wall   motion abnormalities. Doppler parameters are consistent   with abnormal left ventricular relaxation (grade 1   diastolic dysfunction). Doppler parameters are consistent   with high ventricular filling pressure. - Aortic valve: Trivial regurgitation. - Mitral valve: Calcified annulus. Normal thickness leaflets   . Mild regurgitation. - Left atrium: The atrium was mildly dilated. - Pulmonary arteries: Systolic pressure was mildly   increased. PA peak pressure: 37mm Hg (S).    ASSESSMENT:    1. CAD, multiple vessel   2. Chronic diastolic CHF (congestive heart failure) ([place])   3. Peripheral vascular disease ([place])   4. Essential hypertension   5. Mixed hyperlipidemia      PLAN:  In order of problems listed above:  1. Stable without anginal complaints.  Resume metoprolol 12.5 mg twice daily. 2. No evidence of volume overload. 3. Stable without symptoms of claudication. 4. Blood pressure control be better after resuming metoprolol tartrate 12.5 mg twice daily.  4-week follow-up for blood pressure and perhaps further up titration of beta-blocker therapy if not 130/85 mmHg or less. 5. LDL target less than 70.  This is being followed by primary care.  Clinical follow-up in 1 year.  Earlier if cardiac complaints.    Medication Adjustments/Labs and Tests Ordered: Current medicines are reviewed at length with the patient today.  Concerns regarding medicines are outlined above.  Medication changes, Labs  and Tests ordered today are listed in the Patient Instructions below. Patient Instructions  Medication Instructions:  1) START Metoprolol Tartrate 12.5mg  twice  daily  Labwork: None  Testing/Procedures: None  Follow-Up: Your physician wants you to follow-up in: 1 year with Dr. Tamala Julian.  You will receive a reminder letter in the mail two months in advance. If you don't receive a letter, please call our office to schedule the follow-up appointment.   Any Other Special Instructions Will Be Listed Below (If Applicable).     If you need a refill on your cardiac medications before your next appointment, please call your pharmacy.      Signed, Sinclair Grooms, MD  09/19/2017 12:57 PM    [facility] [address], [place], [place]  [address] Phone: [phone]; Fax: [phone]

## 2017-09-19 ENCOUNTER — Encounter (INDEPENDENT_AMBULATORY_CARE_PROVIDER_SITE_OTHER): Payer: Self-pay

## 2017-09-19 ENCOUNTER — Encounter: Payer: Self-pay | Admitting: Interventional Cardiology

## 2017-09-19 ENCOUNTER — Ambulatory Visit: Payer: Medicare Other | Admitting: Interventional Cardiology

## 2017-09-19 VITALS — BP 142/90 | HR 75 | Ht 66.0 in | Wt 160.2 lb

## 2017-09-19 DIAGNOSIS — I5032 Chronic diastolic (congestive) heart failure: Secondary | ICD-10-CM | POA: Diagnosis not present

## 2017-09-19 DIAGNOSIS — I2510 Atherosclerotic heart disease of native coronary artery without angina pectoris: Secondary | ICD-10-CM | POA: Diagnosis not present

## 2017-09-19 DIAGNOSIS — E782 Mixed hyperlipidemia: Secondary | ICD-10-CM | POA: Diagnosis not present

## 2017-09-19 DIAGNOSIS — I10 Essential (primary) hypertension: Secondary | ICD-10-CM | POA: Diagnosis not present

## 2017-09-19 DIAGNOSIS — I739 Peripheral vascular disease, unspecified: Secondary | ICD-10-CM

## 2017-09-19 MED ORDER — METOPROLOL TARTRATE 25 MG PO TABS: 12.5000 mg | ORAL_TABLET | Freq: Two times a day (BID) | ORAL | 3 refills | Status: DC

## 2017-09-19 NOTE — Patient Instructions (Signed)
Medication Instructions:  1) START Metoprolol Tartrate 12.5mg  twice daily  Labwork: None  Testing/Procedures: None  Follow-Up: Your physician wants you to follow-up in: 1 year with Dr. Tamala Julian.  You will receive a reminder letter in the mail two months in advance. If you don't receive a letter, please call our office to schedule the follow-up appointment.   Any Other Special Instructions Will Be Listed Below (If Applicable).     If you need a refill on your cardiac medications before your next appointment, please call your pharmacy.

## 2018-09-14 ENCOUNTER — Other Ambulatory Visit: Payer: Self-pay | Admitting: Interventional Cardiology

## 2018-10-15 ENCOUNTER — Ambulatory Visit: Payer: Medicare Other | Admitting: Interventional Cardiology

## 2018-10-15 NOTE — Progress Notes (Deleted)
Cardiology Office Note:    Date:  10/15/2018   ID:  [name], DOB 1935-02-23, MRN [id number]  PCP:  Haywood Pao, MD  Cardiologist:  Sinclair Grooms, MD   Referring MD: Haywood Pao, MD   No chief complaint on file.   History of Present Illness:    Patricia Villegas is a 84 y.o. female with a hx of HTN, DM II, hyperlipidemia, rheumatoid arthritis, PAD, and CAD s/p CABG x4  in 2014  (LIMA to LAD, SVG to acute marginal, SVG sequential to the obtuse marginal and PL),  Past Medical History:  Diagnosis Date  . Anemia   . Anxiety   . Arthritis   . Blood transfusion   . Chest pain    a. Lexiscan Myoview 12/12:  EF 75%, no ischemia or scar;  b.  Echo 12/12:  EF 60-65%, no sig valvular abnormalities   . Closed left fibular fracture    Casted   . Complication of anesthesia 1998   Unable to arouse fully, respiratory depression, pt. reports that the family was told that she would need a trach., but then she stabilized & didn't need it   . Coronary artery disease    sees Dr Aundra Dubin every 6 months  . Depression   . Diabetes mellitus    Greater than 20 years  . H/O hiatal hernia   . Hypercholesteremia   . Hypertension    Greater than 20 years  . Lumbar disc disease    S/p surgery x4;  Sciatica   . Neuropathy due to secondary diabetes ([place])    Hx: of  . Parastomal hernia of ileal conduit ([place]) 2009  . Pneumonia    s/p CABG  . [name] (peripheral vascular disease) (HCC)    Stable claudication. Arteriogram 07/2009 no significant aortoiliac disease, has bilateral SFA occlusions with distal reconstitution   . Rheumatoid arthritis (HCC)    Dr Berna Bue @ [facility] of breath    Hx: of with exertion  . Transfusion reaction 1960   Convulsions (? seizure)  . UTI (lower urinary tract infection) 01/03/15    Past Surgical History:  Procedure Laterality Date  . ABDOMINAL HYSTERECTOMY    . BACK SURGERY     4 back operations most recently 1995, 1998  .  CARDIAC CATHETERIZATION     Hx: of 05/14/13  . CATARACT EXTRACTION W/ INTRAOCULAR LENS  IMPLANT, BILATERAL     Hx: of  . COLONOSCOPY     Hx; of  . CORONARY ARTERY BYPASS GRAFT N/A 05/24/2013   Procedure: CORONARY ARTERY BYPASS GRAFTING (CABG);  Surgeon: Melrose Nakayama, MD;  Location: [facility];  Service: Open Heart Surgery;  Laterality: N/A;  . DILATION AND CURETTAGE OF UTERUS    . HERNIA REPAIR  2009  . LEFT HEART CATHETERIZATION WITH CORONARY ANGIOGRAM N/A 05/14/2013   Procedure: LEFT HEART CATHETERIZATION WITH CORONARY ANGIOGRAM;  Surgeon: Larey Dresser, MD;  Location: [facility] C[facility] LAB;  Service: Cardiovascular;  Laterality: N/A;  . TONSILLECTOMY    . TOTAL HIP ARTHROPLASTY Left 01/10/2015   Procedure: TOTAL HIP ARTHROPLASTY ANTERIOR APPROACH;  Surgeon: Melrose Nakayama, MD;  Location: [facility];  Service: Orthopedics;  Laterality: Left;    Current Medications: No outpatient medications have been marked as taking for the 10/15/18 encounter (Appointment) with Belva Crome, MD.     Allergies:   Patient has no known allergies.   Social History   Socioeconomic History  .  Marital status: Married    Spouse name: Not on file  . Number of children: 2  . Years of education: Not on file  . Highest education level: Not on file  Occupational History  . Occupation: Retired    Fish farm manager: RETIRED  Social Needs  . Financial resource strain: Not on file  . Food insecurity:    Worry: Not on file    Inability: Not on file  . Transportation needs:    Medical: Not on file    Non-medical: Not on file  Tobacco Use  . Smoking status: Former Smoker    Packs/day: 0.50    Years: 10.00    Pack years: 5.00    Types: Cigarettes    Last attempt to quit: 09/09/1996    Years since quitting: 22.1  . Smokeless tobacco: Former Systems developer    Types: Chew  Substance and Sexual Activity  . Alcohol use: No  . Drug use: No  . Sexual activity: Not Currently  Lifestyle  . Physical activity:    Days per week: Not on file      Minutes per session: Not on file  . Stress: Not on file  Relationships  . Social connections:    Talks on phone: Not on file    Gets together: Not on file    Attends religious service: Not on file    Active member of club or organization: Not on file    Attends meetings of clubs or organizations: Not on file    Relationship status: Not on file  Other Topics Concern  . Not on file  Social History Narrative   Married and has two children      Family History: The patient's family history includes Cancer in her brother and sister; Coronary artery disease (age of onset: 34) in her mother; Diabetes in her brother and mother; Heart attack in her mother; Heart disease in her brother; Kidney disease in her brother; Stroke in her brother and mother.  ROS:   Please see the history of present illness.    *** All other systems reviewed and are negative.  EKGs/Labs/Other Studies Reviewed:    The following studies were reviewed today: ***  EKG:  EKG ***  Recent Labs: No results found for requested labs within last 8760 hours.  Recent Lipid Panel    Component Value Date/Time   CHOL 166 05/13/2014 0932   TRIG 140.0 05/13/2014 0932   HDL 52.50 05/13/2014 0932   CHOLHDL 3 05/13/2014 0932   VLDL 28.0 05/13/2014 0932   LDLCALC 86 05/13/2014 0932    Physical Exam:    VS:  There were no vitals taken for this visit.    Wt Readings from Last 3 Encounters:  09/19/17 160 lb 3.2 oz (72.7 kg)  03/20/16 160 lb (72.6 kg)  04/04/15 161 lb (73 kg)     GEN: ***. No acute distress HEENT: Normal NECK: No JVD. LYMPHATICS: No lymphadenopathy CARDIAC: ***RRR.  *** murmur, ***gallop, ***edema VASCULAR: *** Pulses, *** bruits RESPIRATORY:  Clear to auscultation without rales, wheezing or rhonchi  ABDOMEN: Soft, non-tender, non-distended, No pulsatile mass, MUSCULOSKELETAL: No deformity  SKIN: Warm and dry NEUROLOGIC:  Alert and oriented x 3 PSYCHIATRIC:  Normal affect   ASSESSMENT:     1. Coronary artery disease involving coronary bypass graft of native heart with angina pectoris ([place])   2. Femoral artery occlusion, bilateral   3. Chronic diastolic CHF (congestive heart failure) ([place])   4. Peripheral vascular disease ([place])  5. Mixed hyperlipidemia   6. Essential hypertension    PLAN:    In order of problems listed above:  1. ***   Medication Adjustments/Labs and Tests Ordered: Current medicines are reviewed at length with the patient today.  Concerns regarding medicines are outlined above.  No orders of the defined types were placed in this encounter.  No orders of the defined types were placed in this encounter.   There are no Patient Instructions on file for this visit.   Signed, Sinclair Grooms, MD  10/15/2018 8:35 AM    [place]

## 2018-12-29 ENCOUNTER — Encounter

## 2018-12-29 ENCOUNTER — Ambulatory Visit: Payer: Medicare Other | Admitting: Interventional Cardiology

## 2019-01-08 ENCOUNTER — Telehealth: Payer: Self-pay | Admitting: Interventional Cardiology

## 2019-01-08 ENCOUNTER — Telehealth: Payer: Self-pay | Admitting: Physician Assistant

## 2019-01-08 NOTE — Telephone Encounter (Signed)
Spoke with patient who confirmed all demographics. Patient does not have a smart phone or computer. She does not have My Chart. Will have virals ready for visit.    Virtual Visit Pre-Appointment Phone Call  "(Name), I am calling you today to discuss your upcoming appointment. We are currently trying to limit exposure to the virus that causes COVID-19 by seeing patients at home rather than in the office."  1. "What is the BEST phone number to call the day of the visit?" - include this in appointment notes  2. Do you have or have access to (through a family member/friend) a smartphone with video capability that we can use for your visit?" a. If yes - list this number in appt notes as cell (if different from BEST phone #) and list the appointment type as a VIDEO visit in appointment notes b. If no - list the appointment type as a PHONE visit in appointment notes  Confirm consent - "In the setting of the current Covid19 crisis, you are scheduled for a (phone or video) visit with your provider on (date) at (time).  Just as we do with many in-office visits, in order for you to participate in this visit, we must obtain consent.  If you'd like, I can send this to your mychart (if signed up) or email for you to review.  Otherwise, I can obtain your verbal consent now.  All virtual visits are billed to your insurance company just like a normal visit would be.  By agreeing to a virtual visit, we'd like you to understand that the technology does not allow for your provider to perform an examination, and thus may limit your provider's ability to fully assess your condition. If your provider identifies any concerns that need to be evaluated in person, we will make arrangements to do so.  Finally, though the technology is pretty good, we cannot assure that it will always work on either your or our end, and in the setting of a video visit, we may have to convert it to a phone-only visit.  In either situation, we  cannot ensure that we have a secure connection.  Are you willing to proceed?" Patient said "yes".  3. Advise patient to be prepared - "Two hours prior to your appointment, go ahead and check your blood pressure, pulse, oxygen saturation, and your weight (if you have the equipment to check those) and write them all down. When your visit starts, your provider will ask you for this information. If you have an Apple Watch or Kardia device, please plan to have heart rate information ready on the day of your appointment. Please have a pen and paper handy nearby the day of the visit as well."  4. Give patient instructions for MyChart download to smartphone OR Doximity/Doxy.me as below if video visit (depending on what platform provider is using)  5. Inform patient they will receive a phone call 15 minutes prior to their appointment time (may be from unknown caller ID) so they should be prepared to answer    [facility] has been deemed a candidate for a follow-up tele-health visit to limit community exposure during the Covid-19 pandemic. I spoke with the patient via phone to ensure availability of phone/video source, confirm preferred email & phone number, and discuss instructions and expectations.  I reminded Patricia Villegas to be prepared with any vital sign and/or heart rhythm information that could potentially be obtained via home monitoring, at  the time of her visit. I reminded Patricia Villegas to expect a phone call prior to her visit.  Patricia Villegas 01/08/2019 1:07 PM   INSTRUCTIONS FOR DOWNLOADING THE MYCHART APP TO SMARTPHONE  - The patient must first make sure to have activated MyChart and know their login information - If Apple, go to [employer] and type in MyChart in the search bar and download the app. If Android, ask patient to go to [employer] and type in [place] in the search bar and download the app. The app is free but as with any other app downloads, their  phone may require them to verify saved payment information or Apple/Android password.  - The patient will need to then log into the app with their MyChart username and password, and select [facility] as their healthcare provider to link the account. When it is time for your visit, go to the MyChart app, find appointments, and click Begin Video Visit. Be sure to Select Allow for your device to access the Microphone and Camera for your visit. You will then be connected, and your provider will be with you shortly.  **If they have any issues connecting, or need assistance please contact MyChart service desk [phone])**  **If using a computer, in order to ensure the best quality for their visit they will need to use either of the following Internet Browsers: [employer], or Google Chrome**  IF USING DOXIMITY or DOXY.ME - The patient will receive a link just prior to their visit by text.     FULL LENGTH CONSENT FOR TELE-HEALTH VISIT   I hereby voluntarily request, consent and authorize [facility] and its employed or contracted physicians, physician assistants, nurse practitioners or other licensed health care professionals (the Practitioner), to provide me with telemedicine health care services (the Services") as deemed necessary by the treating Practitioner. I acknowledge and consent to receive the Services by the Practitioner via telemedicine. I understand that the telemedicine visit will involve communicating with the Practitioner through live audiovisual communication technology and the disclosure of certain medical information by electronic transmission. I acknowledge that I have been given the opportunity to request an in-person assessment or other available alternative prior to the telemedicine visit and am voluntarily participating in the telemedicine visit.  I understand that I have the right to withhold or withdraw my consent to the use of telemedicine in the course of  my care at any time, without affecting my right to future care or treatment, and that the Practitioner or I may terminate the telemedicine visit at any time. I understand that I have the right to inspect all information obtained and/or recorded in the course of the telemedicine visit and may receive copies of available information for a reasonable fee.  I understand that some of the potential risks of receiving the Services via telemedicine include:   Delay or interruption in medical evaluation due to technological equipment failure or disruption;  Information transmitted may not be sufficient (e.g. poor resolution of images) to allow for appropriate medical decision making by the Practitioner; and/or   In rare instances, security protocols could fail, causing a breach of personal health information.  Furthermore, I acknowledge that it is my responsibility to provide information about my medical history, conditions and care that is complete and accurate to the best of my ability. I acknowledge that Practitioner's advice, recommendations, and/or decision may be based on factors not within their control, such as incomplete or inaccurate data  provided by me or distortions of diagnostic images or specimens that may result from electronic transmissions. I understand that the practice of medicine is not an exact science and that Practitioner makes no warranties or guarantees regarding treatment outcomes. I acknowledge that I will receive a copy of this consent concurrently upon execution via email to the email address I last provided but may also request a printed copy by calling the office of [facility].    I understand that my insurance will be billed for this visit.   I have read or had this consent read to me.  I understand the contents of this consent, which adequately explains the benefits and risks of the Services being provided via telemedicine.   I have been provided ample opportunity to ask  questions regarding this consent and the Services and have had my questions answered to my satisfaction.  I give my informed consent for the services to be provided through the use of telemedicine in my medical care  By participating in this telemedicine visit I agree to the above.

## 2019-01-08 NOTE — Telephone Encounter (Signed)
New message   Patient is returning call about setting up a virtual visit. The patient has questions about the virtual visit. Please call.

## 2019-01-08 NOTE — Telephone Encounter (Signed)
See other phone note.  Pt has since spoken with Stacie Glaze and been scheduled to see Vin Bhagat, PA-C.

## 2019-01-17 NOTE — Progress Notes (Signed)
Virtual Visit via Telephone Note   This visit type was conducted due to national recommendations for restrictions regarding the COVID-19 Pandemic (e.g. social distancing) in an effort to limit this patient's exposure and mitigate transmission in our community.  Due to her co-morbid illnesses, this patient is at least at moderate risk for complications without adequate follow up.  This format is felt to be most appropriate for this patient at this time.  The patient did not have access to video technology/had technical difficulties with video requiring transitioning to audio format only (telephone).  All issues noted in this document were discussed and addressed.  No physical exam could be performed with this format.  Please refer to the patient's chart for her  consent to telehealth for [facility].   Date:  01/18/2019   ID:  Patricia Villegas, DOB [birth year], MRN [id number]  Patient Location: Home Provider Location: Home  PCP:  Tisovec, Fransico Him, MD  Cardiologist:  Sinclair Grooms, MD  Evaluation Performed:  Follow-Up Visit  Chief Complaint:  15 Months  History of Present Illness:    Patricia Villegas is a 84 y.o. female with hx of HTN, DM, hyperlipidemia, rheumatoid arthritis, PAD, and CAD s/p CABG seen for follow up.   Initial presentation was with "indigestion" that was also associated with tightness in the jaw was and neck.  Evaluation demonstrated severe three-vessel coronary disease and led to coronary bypass grafting.    Last seen by Dr. Tamala Julian 09/19/2017.   She is run out of metoprolol month ago.  She has stable lower extremity calf pain with walking for many years.  She has incline driveway and sometimes needed to stop to catch her breath.  No change in symptoms for years.  Patient able to do household chores without any difficulty.  Lives with husband and following social distancing during COVID-19 pandemic.  She denies orthopnea, PND, lower extremity edema, palpitation,  dizziness, melena or blood in her stool or urine.  The patient does not have symptoms concerning for COVID-19 infection (fever, chills, cough, or new shortness of breath).    Past Medical History:  Diagnosis Date  . Anemia   . Anxiety   . Arthritis   . Blood transfusion   . Chest pain    a. Lexiscan Myoview 12/12:  EF 75%, no ischemia or scar;  b.  Echo 12/12:  EF 60-65%, no sig valvular abnormalities   . Closed left fibular fracture    Casted   . Complication of anesthesia 1998   Unable to arouse fully, respiratory depression, pt. reports that the family was told that she would need a trach., but then she stabilized & didn't need it   . Coronary artery disease    sees Dr Aundra Dubin every 6 months  . Depression   . Diabetes mellitus    Greater than 20 years  . H/O hiatal hernia   . Hypercholesteremia   . Hypertension    Greater than 20 years  . Lumbar disc disease    S/p surgery x4;  Sciatica   . Neuropathy due to secondary diabetes ([place])    Hx: of  . Parastomal hernia of ileal conduit ([place]) 2009  . Pneumonia    s/p CABG  . [name] (peripheral vascular disease) (HCC)    Stable claudication. Arteriogram 07/2009 no significant aortoiliac disease, has bilateral SFA occlusions with distal reconstitution   . Rheumatoid arthritis (HCC)    Dr Berna Bue @ [facility]  of breath    Hx: of with exertion  . Transfusion reaction 1960   Convulsions (? seizure)  . UTI (lower urinary tract infection) 01/03/15   Past Surgical History:  Procedure Laterality Date  . ABDOMINAL HYSTERECTOMY    . BACK SURGERY     4 back operations most recently 1995, 1998  . CARDIAC CATHETERIZATION     Hx: of 05/14/13  . CATARACT EXTRACTION W/ INTRAOCULAR LENS  IMPLANT, BILATERAL     Hx: of  . COLONOSCOPY     Hx; of  . CORONARY ARTERY BYPASS GRAFT N/A 05/24/2013   Procedure: CORONARY ARTERY BYPASS GRAFTING (CABG);  Surgeon: Melrose Nakayama, MD;  Location: [facility];  Service: Open Heart  Surgery;  Laterality: N/A;  . DILATION AND CURETTAGE OF UTERUS    . HERNIA REPAIR  2009  . LEFT HEART CATHETERIZATION WITH CORONARY ANGIOGRAM N/A 05/14/2013   Procedure: LEFT HEART CATHETERIZATION WITH CORONARY ANGIOGRAM;  Surgeon: Larey Dresser, MD;  Location: [facility] CATH LAB;  Service: Cardiovascular;  Laterality: N/A;  . TONSILLECTOMY    . TOTAL HIP ARTHROPLASTY Left 01/10/2015   Procedure: TOTAL HIP ARTHROPLASTY ANTERIOR APPROACH;  Surgeon: Melrose Nakayama, MD;  Location: [facility];  Service: Orthopedics;  Laterality: Left;     Current Meds  Medication Sig  . aspirin EC 81 MG tablet Take 1 tablet (81 mg total) by mouth daily.  . benazepril (LOTENSIN) 20 MG tablet Take 20 mg by mouth daily.  . folic acid (FOLVITE) 1 MG tablet Take 1 mg by mouth daily.  . furosemide (LASIX) 20 MG tablet Take 20-40 mg by mouth daily.   . hydroxychloroquine (PLAQUENIL) 200 MG tablet Take 200 mg by mouth daily.   . methotrexate (RHEUMATREX) 2.5 MG tablet Take 5 mg by mouth once a week. Takes on Fridays  . metoprolol tartrate (LOPRESSOR) 25 MG tablet Take 0.5 tablets (12.5 mg total) by mouth 2 (two) times daily. Please keep upcoming appt for future refills. Thank you  . ONE TOUCH ULTRA TEST test strip   . predniSONE (DELTASONE) 1 MG tablet Take 1-2 mg by mouth daily with breakfast.   . simvastatin (ZOCOR) 80 MG tablet Take 80 mg by mouth at bedtime.       Allergies:   Patient has no known allergies.   Social History   Tobacco Use  . Smoking status: Former Smoker    Packs/day: 0.50    Years: 10.00    Pack years: 5.00    Types: Cigarettes    Last attempt to quit: 09/09/1996    Years since quitting: 22.3  . Smokeless tobacco: Former Systems developer    Types: Chew  Substance Use Topics  . Alcohol use: No  . Drug use: No     Family Hx: The patient's family history includes Cancer in her brother and sister; Coronary artery disease (age of onset: 95) in her mother; Diabetes in her brother and mother; Heart attack in her  mother; Heart disease in her brother; Kidney disease in her brother; Stroke in her brother and mother.  ROS:   Please see the history of present illness.    All other systems reviewed and are negative.   Prior CV studies:   The following studies were reviewed today:  Echo 05/2013 Study Conclusions  - Left ventricle: The cavity size was normal. Wall thickness was increased in a pattern of moderate LVH. There was mild concentric hypertrophy. Systolic function was normal. The estimated ejection fraction was in the range of  60% to 65%. Wall motion was normal; there were no regional wall motion abnormalities. Doppler parameters are consistent with abnormal left ventricular relaxation (grade 1 diastolic dysfunction). Doppler parameters are consistent with high ventricular filling pressure. - Aortic valve: Trivial regurgitation. - Mitral valve: Calcified annulus. Normal thickness leaflets . Mild regurgitation. - Left atrium: The atrium was mildly dilated. - Pulmonary arteries: Systolic pressure was mildly increased. PA peak pressure: 25mm Hg (S).  Labs/Other Tests and Data Reviewed:    EKG:  No ECG reviewed.  Recent Labs: No results found for requested labs within last 8760 hours.   Recent Lipid Panel Lab Results  Component Value Date/Time   CHOL 166 05/13/2014 09:32 AM   TRIG 140.0 05/13/2014 09:32 AM   HDL 52.50 05/13/2014 09:32 AM   CHOLHDL 3 05/13/2014 09:32 AM   LDLCALC 86 05/13/2014 09:32 AM    Wt Readings from Last 3 Encounters:  01/18/19 139 lb (63 kg)  09/19/17 160 lb 3.2 oz (72.7 kg)  03/20/16 160 lb (72.6 kg)     Objective:    Vital Signs:  BP 134/83   Pulse 82   Ht 5\' 6"  (1.676 m)   Wt 139 lb (63 kg)   BMI 22.44 kg/m    VITAL SIGNS:  reviewed GEN:  no acute distress PSYCH:  normal affect  ASSESSMENT & PLAN:    1. CAD s/p CABG -Patient has chronic stable dyspnea with exertion for many years.  No change in current medication  therapy.  2. HTN -Stable and well controlled on current medications.  3. [name] -Continue statin.  4. PAD - Stable bilateral SFA occlusion per last study 04/2015.  -She has stable lower extremity pain since then.  May consider evaluation during follow-up if worsening symptoms.  5. Chronic diastolic CHF -No symptoms concerning for exacerbation.  Continue current diuretic.  COVID-19 Education: The signs and symptoms of COVID-19 were discussed with the patient and how to seek care for testing (follow up with PCP or arrange E-visit).  The importance of social distancing was discussed today.  Time:   Today, I have spent 9 minutes with the patient with telehealth technology discussing the above problems.     Medication Adjustments/Labs and Tests Ordered: Current medicines are reviewed at length with the patient today.  Concerns regarding medicines are outlined above.   Tests Ordered: No orders of the defined types were placed in this encounter.   Medication Changes: No orders of the defined types were placed in this encounter.   Disposition:  Follow up in 6 month(s)  Signed, Leanor Kail, PA  01/18/2019 11:14 AM    [place]

## 2019-01-18 ENCOUNTER — Encounter: Payer: Self-pay | Admitting: Physician Assistant

## 2019-01-18 ENCOUNTER — Telehealth (INDEPENDENT_AMBULATORY_CARE_PROVIDER_SITE_OTHER): Payer: Medicare Other | Admitting: Physician Assistant

## 2019-01-18 ENCOUNTER — Other Ambulatory Visit: Payer: Self-pay

## 2019-01-18 VITALS — BP 134/83 | HR 82 | Ht 66.0 in | Wt 139.0 lb

## 2019-01-18 DIAGNOSIS — I739 Peripheral vascular disease, unspecified: Secondary | ICD-10-CM

## 2019-01-18 DIAGNOSIS — I25700 Atherosclerosis of coronary artery bypass graft(s), unspecified, with unstable angina pectoris: Secondary | ICD-10-CM | POA: Diagnosis not present

## 2019-01-18 DIAGNOSIS — E782 Mixed hyperlipidemia: Secondary | ICD-10-CM

## 2019-01-18 DIAGNOSIS — I10 Essential (primary) hypertension: Secondary | ICD-10-CM

## 2019-01-18 DIAGNOSIS — I5032 Chronic diastolic (congestive) heart failure: Secondary | ICD-10-CM

## 2019-01-18 MED ORDER — METOPROLOL TARTRATE 25 MG PO TABS: 12.5000 mg | ORAL_TABLET | Freq: Two times a day (BID) | ORAL | 0 refills | Status: DC

## 2019-01-18 NOTE — Patient Instructions (Signed)
Medication Instructions:  Your physician recommends that you continue on your current medications as directed. Please refer to the Current Medication list given to you today.  If you need a refill on your cardiac medications before your next appointment, please call your pharmacy.   Lab work: NONE  If you have labs (blood work) drawn today and your tests are completely normal, you will receive your results only by: . MyChart Message (if you have MyChart) OR . A paper copy in the mail If you have any lab test that is abnormal or we need to change your treatment, we will call you to review the results.  Testing/Procedures: NONE  Follow-Up: At [facility], you and your health needs are our priority.  As part of our continuing mission to provide you with exceptional heart care, we have created designated Provider Care Teams.  These Care Teams include your primary Cardiologist (physician) and Advanced Practice Providers (APPs -  Physician Assistants and Nurse Practitioners) who all work together to provide you with the care you need, when you need it. You will need a follow up appointment in 6 months.  Please call our office 2 months in advance to schedule this appointment.  You may see Henry W Smith III, MD or one of the following Advanced Practice Providers on your designated Care Team:   Lori Gerhardt, NP Laura Ingold, NP . Jill McDaniel, NP     

## 2019-04-19 ENCOUNTER — Other Ambulatory Visit: Payer: Self-pay | Admitting: Physician Assistant

## 2019-07-17 NOTE — Progress Notes (Signed)
Cardiology Office Note:    Date:  07/19/2019   ID:  Patricia Villegas, DOB [birth year], MRN [id number]  PCP:  Patricia Pao, MD  Cardiologist:  Patricia Grooms, MD  Electrophysiologist:  None   Referring MD: Patricia Pao, MD   Chief Complaint: follow-up of CAD   History of Present Illness:    Patricia Villegas is a 84 y.o. female with a history of CAD s/p CABG in 2014, PAD with known bilateral SFA occlusions on last study in [id number], chronic diastolic CHF, hypertension, hyperlipidemia, diabetes mellitus, and rheumatoid arthritis who is followed by Dr. Tamala Villegas and presents today for follow-up of CAD.  Patient has done well from a cardiac standpoint since CABG in 2014. She has PAD with known stable SFA occlusion based on most recent lower extremity ultrasounds in 04/2015. Patient was last seen in 01/2019 by Patricia Lis, PA-C, for telehealth visit at which time she reported stable lower extremity calf pain with walking as well as stable dyspnea with exertion. No medication changes were made and patient was advised to follow up in 6 months.  Patient presents today for follow-up.  Patient has a couple of concerns today.  She reports dyspnea on walking on incline over the past year.  She also notes some stable 3 pillow orthopnea but no PND.  She has had some lower extremity swelling over the past 4 to 5 days that is worse on her right leg.  Her weight is actually down a few pounds since her last office visit almost a year ago. She does not follow low sodium diet.  She also notes some sharp chest pain when she is feeling stressed and when she takes a deep breath.  She has trouble elaborating much more on this pain.  However, she does note that it is not worse with activity.  She states she does not notice any other chest pain.  Pleuritic chest pain has been ongoing since the spring.  She notes some heart racing laying she walks on an incline becomes short of breath but states this quickly resolves with  rest.  She does note some lightheadedness and dizziness but also has some balance issues recently which makes it difficult for her to distinguish between the 2.  No syncope.  She bruises easily but no abnormal bleeding.  Her BP is very elevated today in the office with systolic BPs in the [id number] to 190s.  She does have a BP machine at home and states systolic BP is often in the 140s.  However, this morning systolic BP was in the [id number] before taking her medications or drinking coffee.  She recently increased her dose of Prednisone to 5 mg daily due to worsening pain from rheumatoid arthritis which may be contributing.  She also reports lower extremity pain but describes it as more knee pain that radiates down the side of her leg.  It sounds like her claudication is stable.  Past Medical History:  Diagnosis Date  . Anemia   . Anxiety   . Arthritis   . Blood transfusion   . Chest pain    a. Lexiscan Myoview 12/12:  EF 75%, no ischemia or scar;  b.  Echo 12/12:  EF 60-65%, no sig valvular abnormalities   . Closed left fibular fracture    Casted   . Complication of anesthesia 1998   Unable to arouse fully, respiratory depression, pt. reports that the family was told that she would need a trach., but  then she stabilized & didn't need it   . Coronary artery disease    sees Dr Aundra Dubin every 6 months  . Depression   . Diabetes mellitus    Greater than 20 years  . H/O hiatal hernia   . Hypercholesteremia   . Hypertension    Greater than 20 years  . Lumbar disc disease    S/p surgery x4;  Sciatica   . Neuropathy due to secondary diabetes ([place])    Hx: of  . Parastomal hernia of ileal conduit ([place]) 2009  . Pneumonia    s/p CABG  . [name] (peripheral vascular disease) ([facility])    Stable claudication. Arteriogram 07/2009 no significant aortoiliac disease, has bilateral SFA occlusions with distal reconstitution   . Rheumatoid arthritis ([facility])    Dr Berna Bue @ [place] of breath     Hx: of with exertion  . Transfusion reaction 1960   Convulsions (? seizure)  . UTI (lower urinary tract infection) 01/03/15    Past Surgical History:  Procedure Laterality Date  . ABDOMINAL HYSTERECTOMY    . BACK SURGERY     4 back operations most recently 1995, 1998  . CARDIAC CATHETERIZATION     Hx: of 05/14/13  . CATARACT EXTRACTION W/ INTRAOCULAR LENS  IMPLANT, BILATERAL     Hx: of  . COLONOSCOPY     Hx; of  . CORONARY ARTERY BYPASS GRAFT N/A 05/24/2013   Procedure: CORONARY ARTERY BYPASS GRAFTING (CABG);  Surgeon: Melrose Nakayama, MD;  Location: [facility];  Service: Open Heart Surgery;  Laterality: N/A;  . DILATION AND CURETTAGE OF UTERUS    . HERNIA REPAIR  2009  . LEFT HEART CATHETERIZATION WITH CORONARY ANGIOGRAM N/A 05/14/2013   Procedure: LEFT HEART CATHETERIZATION WITH CORONARY ANGIOGRAM;  Surgeon: Larey Dresser, MD;  Location: [facility] CATH LAB;  Service: Cardiovascular;  Laterality: N/A;  . TONSILLECTOMY    . TOTAL HIP ARTHROPLASTY Left 01/10/2015   Procedure: TOTAL HIP ARTHROPLASTY ANTERIOR APPROACH;  Surgeon: Melrose Nakayama, MD;  Location: [facility];  Service: Orthopedics;  Laterality: Left;    Current Medications: Current Meds  Medication Sig  . aspirin EC 81 MG tablet Take 1 tablet (81 mg total) by mouth daily.  . benazepril (LOTENSIN) 20 MG tablet Take 20 mg by mouth daily.  . metoprolol tartrate (LOPRESSOR) 25 MG tablet Take 0.5 tablets (12.5 mg total) by mouth 2 (two) times daily.  . ONE TOUCH ULTRA TEST test strip   . predniSONE (DELTASONE) 1 MG tablet Take 5 mg by mouth daily with breakfast.   . simvastatin (ZOCOR) 80 MG tablet Take 80 mg by mouth at bedtime.    . [DISCONTINUED] furosemide (LASIX) 20 MG tablet Take 20-40 mg by mouth daily.   . [DISCONTINUED] metoprolol tartrate (LOPRESSOR) 25 MG tablet Take 0.5 tablets (12.5 mg total) by mouth 2 (two) times daily.     Allergies:   Patient has no known allergies.   Social History   Socioeconomic History  . Marital  status: Married    Spouse name: Not on file  . Number of children: 2  . Years of education: Not on file  . Highest education level: Not on file  Occupational History  . Occupation: Retired    Fish farm manager: RETIRED  Social Needs  . Financial resource strain: Not on file  . Food insecurity    Worry: Not on file    Inability: Not on file  . Transportation needs    Medical:  Not on file    Non-medical: Not on file  Tobacco Use  . Smoking status: Former Smoker    Packs/day: 0.50    Years: 10.00    Pack years: 5.00    Types: Cigarettes    Quit date: 09/09/1996    Years since quitting: 22.8  . Smokeless tobacco: Former Systems developer    Types: Chew  Substance and Sexual Activity  . Alcohol use: No  . Drug use: No  . Sexual activity: Not Currently  Lifestyle  . Physical activity    Days per week: Not on file    Minutes per session: Not on file  . Stress: Not on file  Relationships  . Social Herbalist on phone: Not on file    Gets together: Not on file    Attends religious service: Not on file    Active member of club or organization: Not on file    Attends meetings of clubs or organizations: Not on file    Relationship status: Not on file  Other Topics Concern  . Not on file  Social History Narrative   Married and has two children      Family History: The patient's family history includes Cancer in her brother and sister; Coronary artery disease (age of onset: 67) in her mother; Diabetes in her brother and mother; Heart attack in her mother; Heart disease in her brother; Kidney disease in her brother; Stroke in her brother and mother.  ROS:   Please see the history of present illness.    All other systems reviewed and are negative.  EKGs/Labs/Other Studies Reviewed:    The following studies were reviewed today:  Echocardiogram 05/17/2013: Study Conclusions: - Left ventricle: The cavity size was normal. Wall thickness  was increased in a pattern of moderate LVH. There  was mild  concentric hypertrophy. Systolic function was normal. The  estimated ejection fraction was in the range of 60% to  65%. Wall motion was normal; there were no regional wall  motion abnormalities. Doppler parameters are consistent  with abnormal left ventricular relaxation (grade 1  diastolic dysfunction). Doppler parameters are consistent  with high ventricular filling pressure.  - Aortic valve: Trivial regurgitation.  - Mitral valve: Calcified annulus. Normal thickness leaflets  . Mild regurgitation.  - Left atrium: The atrium was mildly dilated.  - Pulmonary arteries: Systolic pressure was mildly  increased. PA peak pressure: 68mm Hg (S).  _______________  Pre-CABG Dopplers 05/19/2013: Summary: - Bilateral - 1% to 39% ICA stenosis. Vertebral artery flow is antegrade.  - Palmar arch evaluation - Doppler wave forms remained normal bilaterally with both radial and ulnar  compressions.  - Bilateral - ABIs indicate normal arterial flow however  abnormal Doppler waveforms would suggest a false elevation due to calcification.  _______________  Lower Extremity Ultrasounds with ABIs 04/14/2015: Impressions: Evidence of medial calcification, at least on the right, and known bilateral SFA occlusions. ABI's are at least in the moderate range, bilaterally, with monophasic waveforms of good amplitude.  Bilateral great toe pressures are abnormal, although good amplitude PPG waveforms.  Stable bilateral SFA occlusions.   EKG:  EKG ordered today. EKG personally reviewed and demonstrates normal sinus rhythm, rate 61 bpm, with left axis deviation and septal Q waves but no acute ST/T changes from prior tracings. QTc 475 ms.  Recent Labs: No results found for requested labs within last 8760 hours.  Recent Lipid Panel    Component Value Date/Time   CHOL 166 05/13/2014  0932   TRIG 140.0 05/13/2014 0932   HDL 52.50 05/13/2014 0932   CHOLHDL 3 05/13/2014 0932   VLDL 28.0  05/13/2014 0932   LDLCALC 86 05/13/2014 0932    Physical Exam:    Vital Signs: BP (!) 162/80   Pulse 61   Ht 5\' 6"  (1.676 m)   Wt 151 lb 12.8 oz (68.9 kg)   SpO2 99%   BMI 24.50 kg/m     Wt Readings from Last 3 Encounters:  07/19/19 151 lb 12.8 oz (68.9 kg)  01/18/19 139 lb (63 kg)  09/19/17 160 lb 3.2 oz (72.7 kg)     General: 84 y.o. Caucasian female resting comfortably in no acute distress. HEENT: Normocephalic and atraumatic. Sclera clear. EOMs intact. Neck: Supple. Left carotid bruit. No JVD. Heart: RRR. Distinct S1 and S2. No murmurs, gallops, or rubs. Radial pulses 2+ and equal bilaterally. Lungs: No increased work of breathing. Clear to ausculation bilaterally. No wheezes, rhonchi, or rales.  Abdomen: Soft, non-distended, and non-tender to palpation. Bowel sounds present MSK: Normal strength and tone for age. Extremities: 1+ pitting edema of bilateral lower extremities (right > left). No erythema. Skin: Warm and dry. Neuro: Alert and oriented x3. No focal deficits. Psych: Normal affect. Responds appropriately.   Assessment:    1. Coronary artery disease involving native coronary artery of native heart without angina pectoris   2. Atypical chest pain   3. Dyspnea on exertion   4. Chronic diastolic CHF (congestive heart failure) ([place])   5. PAD (peripheral artery disease) ([place])   6. Bruit of left carotid artery   7. Essential hypertension   8. Hyperlipidemia, unspecified hyperlipidemia type     Plan:    CAD s/p CABG in 2014/ Atypical Chest Pain / Dyspnea on Exertion History of CABG x4 with LIMA to LAD, SVG to Mrg, SVG to OM1, and SVG to PLA in 2014. Patient reports worsening dyspnea on exertion over the last year. She also notes atypical sharp chest pain when stressed and with taking deep breaths. No exertional chest pain. She is unable to really tell me if this is similar to the pain she had in 2014 prior to CABG. No acute changes on EKG. Will check Echo,  pro-BNP, chest x-ray, and CBC to rule out anemia. Patient is not hypoxic or tachycardia and given duration of pleuritic chest pain, I have low suspicion for PE. Pending these results, may need additional ischemic evaluation. Continue aspirin, beta-blocker, and statin.   Possible Acute on Chronic Diastolic CHF Patient reports worsening dyspnea on exertion over the last year as well as bilateral lower extremity edema over the last 4-5 days. Right leg slightly larger than left. Sounds like she eats a high sodium diet. Will order Echo and Pro-BNP. Will increase Lasix to 40mg  daily for the next week. If lower extremity edema does not improve with Lasix and right leg still larger than left at follow-up next week, may need to consider lower extremity dopplers to rule out DVT. Will see patient back next week for close follow-up.  PAD Most recent lower extremity duplex in 2016 showed stable bilateral SFA occlusions. Patient does complain of lower extremity pain today but mostly in knees and radiating down sides of legs. She states pain is the worst at night. Claudication sounds stable but patient has trouble truly describing her leg pain. Continue aspirin and statin for now. Patient going to see Dr. Tamala Villegas next week - will defer to him about whether PV referral is  needed.  Left Carotid Bruit Left carotid bruit noted on exam. She does report some lightheadedness/dizziness at times. Will order carotid dopplers.   Hypertension Markedly elevated today with BP in the 180's-190's/70 when I personally checked it during visit. Patient states systolic BP normally in the 140's at home but was in the 190's this morning. Possibly due to recent increase in Prednisone for rheumatoid arthritis. Patient has some lightheadedness/dizziness as well as balance issues so would be cautious with up-titrating medications given patient's age. Will increase Lasix as above. Continue and Benazepril 20mg  daily  Lopressor 12.5mg  twice daily.  Have asked patient to keep BP log and bring to office visit next week. Will check BMET today in case we need to increase Benazepril or make additional medication adjustments.  Hyperlipidemia Most recent lipid panel from 03/2019 ([facility]): Total Cholesterol 129, Triglycerides 51, HDL 58, LDL 61.At LDL goal <70 given CAD.Continue Simvastatin 80mg  daily. Labs followed by PCP.  Disposition: Follow up in 1 week with Dr. Tamala Villegas following above tests.    Medication Adjustments/Labs and Tests Ordered: Current medicines are reviewed at length with the patient today.  Concerns regarding medicines are outlined above.  Orders Placed This Encounter  Procedures  . DG Chest 2 View  . Basic Metabolic Panel (BMET)  . Pro b natriuretic peptide  . CBC w/Diff  . EKG 12-Lead  . ECHOCARDIOGRAM COMPLETE  . VAS US CAROTID   Meds ordered this encounter  Medications  . metoprolol tartrate (LOPRESSOR) 25 MG tablet    Sig: Take 0.5 tablets (12.5 mg total) by mouth 2 (two) times daily.    Dispense:  90 tablet    Refill:  3  . furosemide (LASIX) 40 MG tablet    Sig: Take 1 tablet (40 mg total) by mouth daily.    Dispense:  90 tablet    Refill:  1    Patient Instructions  Medication Instructions:  Your physician has recommended you make the following change in your medication: 1. Increase lasix to (40 mg ) daily, new script sent into pharmacy.   *If you need a refill on your cardiac medications before your next appointment, please call your pharmacy*  Lab Work: Your physician recommends that you  have lab work today: CBC/Pro BNP/BMET  If you have labs (blood work) drawn today and your tests are completely normal, you will receive your results only by: Marland Kitchen MyChart Message (if you have MyChart) OR . A paper copy in the mail If you have any lab test that is abnormal or we need to change your treatment, we will call you to review the results.  Testing/Procedures: Your physician has requested that you have a  carotid duplex. This test is an ultrasound of the carotid arteries in your neck. It looks at blood flow through these arteries that supply the brain with blood. Allow one hour for this exam. There are no restrictions or special instructions.  Your physician has requested that you have an echocardiogram. Echocardiography is a painless test that uses sound waves to create images of your heart. It provides your doctor with information about the size and shape of your heart and how well your heart's chambers and valves are working. This procedure takes approximately one hour. There are no restrictions for this procedure.  A chest x-ray takes a picture of the organs and structures inside the chest, including the heart, lungs, and blood vessels. This test can show several things, including, whether the heart is enlarges; whether fluid  is building up in the lungs; and whether pacemaker / defibrillator leads are still in place.   Follow-Up: At [facility], you and your health needs are our priority.  As part of our continuing mission to provide you with exceptional heart care, we have created designated Provider Care Teams.  These Care Teams include your primary Cardiologist (physician) and Advanced Practice Providers (APPs -  Physician Assistants and Nurse Practitioners) who all work together to provide you with the care you need, when you need it.  Your next appointment:   Your physician recommends that you keep your  schedules  follow-up appointment with Dr. Tamala Villegas on Wednesday, November 18.   special instructions:  Please keep checking your bp at least once daily and keeping a log.  Bring bp cuff and log in with you with your visit with Dr. Tamala Villegas.   Blood Pressure Record Sheet To take your blood pressure, you will need a blood pressure machine. You can buy a blood pressure machine (blood pressure monitor) at your clinic, drug store, or online. When choosing one, consider:  An automatic monitor  that has an arm cuff.  A cuff that wraps snugly around your upper arm. You should be able to fit only one finger between your arm and the cuff.  A device that stores blood pressure reading results.  Do not choose a monitor that measures your blood pressure from your wrist or finger. Follow your health care provider's instructions for how to take your blood pressure. To use this form:  Get one reading in the morning (a.m.) before you take any medicines.  Get one reading in the evening (p.m.) before supper.  Take at least 2 readings with each blood pressure check. This makes sure the results are correct. Wait 1-2 minutes between measurements.  Write down the results in the spaces on this form.  Repeat this once a week, or as told by your health care provider.  Make a follow-up appointment with your health care provider to discuss the results. Blood pressure log Date: _______________________  a.m. _____________________(1st reading) _____________________(2nd reading)  p.m. _____________________(1st reading) _____________________(2nd reading) Date: _______________________  a.m. _____________________(1st reading) _____________________(2nd reading)  p.m. _____________________(1st reading) _____________________(2nd reading) Date: _______________________  a.m. _____________________(1st reading) _____________________(2nd reading)  p.m. _____________________(1st reading) _____________________(2nd reading) Date: _______________________  a.m. _____________________(1st reading) _____________________(2nd reading)  p.m. _____________________(1st reading) _____________________(2nd reading) Date: _______________________  a.m. _____________________(1st reading) _____________________(2nd reading)  p.m. _____________________(1st reading) _____________________(2nd reading) This information is not intended to replace advice given to you by your health care provider. Make sure you discuss any  questions you have with your health care provider. Document Released: 05/25/2003 Document Revised: 10/24/2017 Document Reviewed: 08/26/2017 Elsevier Patient Education  2020 [place], Darreld [name], [place]  07/19/2019 6:35 PM    [place]

## 2019-07-19 ENCOUNTER — Encounter: Payer: Self-pay | Admitting: Student

## 2019-07-19 ENCOUNTER — Ambulatory Visit: Payer: Medicare Other | Admitting: Student

## 2019-07-19 ENCOUNTER — Encounter (INDEPENDENT_AMBULATORY_CARE_PROVIDER_SITE_OTHER): Payer: Self-pay

## 2019-07-19 ENCOUNTER — Other Ambulatory Visit: Payer: Self-pay

## 2019-07-19 VITALS — BP 162/80 | HR 61 | Ht 66.0 in | Wt 151.8 lb

## 2019-07-19 DIAGNOSIS — I5032 Chronic diastolic (congestive) heart failure: Secondary | ICD-10-CM | POA: Diagnosis not present

## 2019-07-19 DIAGNOSIS — R0789 Other chest pain: Secondary | ICD-10-CM | POA: Diagnosis not present

## 2019-07-19 DIAGNOSIS — R0989 Other specified symptoms and signs involving the circulatory and respiratory systems: Secondary | ICD-10-CM

## 2019-07-19 DIAGNOSIS — R0600 Dyspnea, unspecified: Secondary | ICD-10-CM | POA: Diagnosis not present

## 2019-07-19 DIAGNOSIS — I2510 Atherosclerotic heart disease of native coronary artery without angina pectoris: Secondary | ICD-10-CM

## 2019-07-19 DIAGNOSIS — E785 Hyperlipidemia, unspecified: Secondary | ICD-10-CM

## 2019-07-19 DIAGNOSIS — I10 Essential (primary) hypertension: Secondary | ICD-10-CM

## 2019-07-19 DIAGNOSIS — I739 Peripheral vascular disease, unspecified: Secondary | ICD-10-CM

## 2019-07-19 DIAGNOSIS — R0609 Other forms of dyspnea: Secondary | ICD-10-CM

## 2019-07-19 MED ORDER — FUROSEMIDE 40 MG PO TABS: 40.0000 mg | ORAL_TABLET | Freq: Every day | ORAL | 1 refills | Status: DC

## 2019-07-19 MED ORDER — METOPROLOL TARTRATE 25 MG PO TABS: 12.5000 mg | ORAL_TABLET | Freq: Two times a day (BID) | ORAL | 3 refills | Status: DC

## 2019-07-19 NOTE — Patient Instructions (Signed)
Medication Instructions:  Your physician has recommended you make the following change in your medication: 1. Increase lasix to (40 mg ) daily, new script sent into pharmacy.   *If you need a refill on your cardiac medications before your next appointment, please call your pharmacy*  Lab Work: Your physician recommends that you  have lab work today: CBC/Pro BNP/BMET  If you have labs (blood work) drawn today and your tests are completely normal, you will receive your results only by: Marland Kitchen MyChart Message (if you have MyChart) OR . A paper copy in the mail If you have any lab test that is abnormal or we need to change your treatment, we will call you to review the results.  Testing/Procedures: Your physician has requested that you have a carotid duplex. This test is an ultrasound of the carotid arteries in your neck. It looks at blood flow through these arteries that supply the brain with blood. Allow one hour for this exam. There are no restrictions or special instructions.  Your physician has requested that you have an echocardiogram. Echocardiography is a painless test that uses sound waves to create images of your heart. It provides your doctor with information about the size and shape of your heart and how well your heart's chambers and valves are working. This procedure takes approximately one hour. There are no restrictions for this procedure.  A chest x-ray takes a picture of the organs and structures inside the chest, including the heart, lungs, and blood vessels. This test can show several things, including, whether the heart is enlarges; whether fluid is building up in the lungs; and whether pacemaker / defibrillator leads are still in place.   Follow-Up: At [facility], you and your health needs are our priority.  As part of our continuing mission to provide you with exceptional heart care, we have created designated Provider Care Teams.  These Care Teams include your primary  Cardiologist (physician) and Advanced Practice Providers (APPs -  Physician Assistants and Nurse Practitioners) who all work together to provide you with the care you need, when you need it.  Your next appointment:   Your physician recommends that you keep your  schedules  follow-up appointment with Dr. Tamala Julian on Wednesday, November 18.   special instructions:  Please keep checking your bp at least once daily and keeping a log.  Bring bp cuff and log in with you with your visit with Dr. Tamala Julian.   Blood Pressure Record Sheet To take your blood pressure, you will need a blood pressure machine. You can buy a blood pressure machine (blood pressure monitor) at your clinic, drug store, or online. When choosing one, consider:  An automatic monitor that has an arm cuff.  A cuff that wraps snugly around your upper arm. You should be able to fit only one finger between your arm and the cuff.  A device that stores blood pressure reading results.  Do not choose a monitor that measures your blood pressure from your wrist or finger. Follow your health care provider's instructions for how to take your blood pressure. To use this form:  Get one reading in the morning (a.m.) before you take any medicines.  Get one reading in the evening (p.m.) before supper.  Take at least 2 readings with each blood pressure check. This makes sure the results are correct. Wait 1-2 minutes between measurements.  Write down the results in the spaces on this form.  Repeat this once a week, or as told  by your health care provider.  Make a follow-up appointment with your health care provider to discuss the results. Blood pressure log Date: _______________________  a.m. _____________________(1st reading) _____________________(2nd reading)  p.m. _____________________(1st reading) _____________________(2nd reading) Date: _______________________  a.m. _____________________(1st reading) _____________________(2nd reading)   p.m. _____________________(1st reading) _____________________(2nd reading) Date: _______________________  a.m. _____________________(1st reading) _____________________(2nd reading)  p.m. _____________________(1st reading) _____________________(2nd reading) Date: _______________________  a.m. _____________________(1st reading) _____________________(2nd reading)  p.m. _____________________(1st reading) _____________________(2nd reading) Date: _______________________  a.m. _____________________(1st reading) _____________________(2nd reading)  p.m. _____________________(1st reading) _____________________(2nd reading) This information is not intended to replace advice given to you by your health care provider. Make sure you discuss any questions you have with your health care provider. Document Released: 05/25/2003 Document Revised: 10/24/2017 Document Reviewed: 08/26/2017 Elsevier Patient Education  2020 [employer].

## 2019-07-20 LAB — CBC WITH DIFFERENTIAL/PLATELET
Basophils Absolute: 0 10*3/uL (ref 0.0–0.2)
Basos: 0 %
EOS (ABSOLUTE): 0 10*3/uL (ref 0.0–0.4)
Eos: 0 %
Hematocrit: 32.6 % — ABNORMAL LOW (ref 34.0–46.6)
Hemoglobin: 10.6 g/dL — ABNORMAL LOW (ref 11.1–15.9)
Immature Grans (Abs): 0.1 10*3/uL (ref 0.0–0.1)
Immature Granulocytes: 1 %
Lymphocytes Absolute: 1.7 10*3/uL (ref 0.7–3.1)
Lymphs: 14 %
MCH: 28.5 pg (ref 26.6–33.0)
MCHC: 32.5 g/dL (ref 31.5–35.7)
MCV: 88 fL (ref 79–97)
Monocytes Absolute: 0.5 10*3/uL (ref 0.1–0.9)
Monocytes: 4 %
Neutrophils Absolute: 9.4 10*3/uL — ABNORMAL HIGH (ref 1.4–7.0)
Neutrophils: 81 %
Platelets: 208 10*3/uL (ref 150–450)
RBC: 3.72 x10E6/uL — ABNORMAL LOW (ref 3.77–5.28)
RDW: 13.9 % (ref 11.7–15.4)
WBC: 11.8 10*3/uL — ABNORMAL HIGH (ref 3.4–10.8)

## 2019-07-20 LAB — BASIC METABOLIC PANEL
BUN/Creatinine Ratio: 20 (ref 12–28)
BUN: 18 mg/dL (ref 8–27)
CO2: 24 mmol/L (ref 20–29)
Calcium: 9 mg/dL (ref 8.7–10.3)
Chloride: 101 mmol/L (ref 96–106)
Creatinine, Ser: 0.88 mg/dL (ref 0.57–1.00)
GFR calc Af Amer: 71 mL/min/{1.73_m2} (ref >59)
GFR calc non Af Amer: 61 mL/min/{1.73_m2} (ref >59)
Glucose: 286 mg/dL — ABNORMAL HIGH (ref 65–99)
Potassium: 4.2 mmol/L (ref 3.5–5.2)
Sodium: 139 mmol/L (ref 134–144)

## 2019-07-20 LAB — PRO B NATRIURETIC PEPTIDE: NT-Pro BNP: 1614 pg/mL — ABNORMAL HIGH (ref 0–738)

## 2019-07-21 ENCOUNTER — Ambulatory Visit (HOSPITAL_COMMUNITY)
Admission: RE | Admit: 2019-07-21 | Discharge: 2019-07-21 | Disposition: A | Discharge status: Home / Self Care | Payer: Medicare Other | Source: Ambulatory Visit | Attending: Cardiovascular Disease | Admitting: Cardiovascular Disease

## 2019-07-21 ENCOUNTER — Ambulatory Visit
Admission: RE | Admit: 2019-07-21 | Discharge: 2019-07-21 | Disposition: A | Discharge status: Home / Self Care | Payer: Medicare Other | Source: Ambulatory Visit | Attending: Student | Admitting: Student

## 2019-07-21 ENCOUNTER — Other Ambulatory Visit: Payer: Self-pay

## 2019-07-21 DIAGNOSIS — R0600 Dyspnea, unspecified: Secondary | ICD-10-CM | POA: Insufficient documentation

## 2019-07-21 DIAGNOSIS — I5032 Chronic diastolic (congestive) heart failure: Secondary | ICD-10-CM

## 2019-07-21 DIAGNOSIS — R0609 Other forms of dyspnea: Secondary | ICD-10-CM

## 2019-07-21 DIAGNOSIS — I10 Essential (primary) hypertension: Secondary | ICD-10-CM

## 2019-07-21 DIAGNOSIS — R0989 Other specified symptoms and signs involving the circulatory and respiratory systems: Secondary | ICD-10-CM

## 2019-07-26 ENCOUNTER — Ambulatory Visit (HOSPITAL_COMMUNITY): Payer: Medicare Other | Attending: Cardiology

## 2019-07-26 ENCOUNTER — Other Ambulatory Visit: Payer: Self-pay

## 2019-07-26 ENCOUNTER — Ambulatory Visit: Payer: Medicare Other | Admitting: Interventional Cardiology

## 2019-07-26 DIAGNOSIS — I5032 Chronic diastolic (congestive) heart failure: Secondary | ICD-10-CM | POA: Diagnosis not present

## 2019-07-26 DIAGNOSIS — R0609 Other forms of dyspnea: Secondary | ICD-10-CM

## 2019-07-26 DIAGNOSIS — I10 Essential (primary) hypertension: Secondary | ICD-10-CM

## 2019-07-26 DIAGNOSIS — R0989 Other specified symptoms and signs involving the circulatory and respiratory systems: Secondary | ICD-10-CM

## 2019-07-26 DIAGNOSIS — R0600 Dyspnea, unspecified: Secondary | ICD-10-CM

## 2019-07-27 NOTE — Progress Notes (Signed)
Cardiology Office Note:    Date:  07/28/2019   ID:  [name], DOB [birth year], MRN [id number]  PCP:  Haywood Pao, MD  Cardiologist:  Sinclair Grooms, MD   Referring MD: Haywood Pao, MD   Chief Complaint  Patient presents with  . Shortness of Breath  . Congestive Heart Failure    History of Present Illness:    [facility]E [facility] is a 84 y.o. female with a hx of DM II, hypertension, hyperlipidemia, and CAD/CABG.  She has had progressive shortness of breath over the past 6 to 12 months.  She was seen by Sande Rives and had a work-up that included an echocardiogram and BNP.  She does not have orthopnea.  She complains of soreness in her chest.  She feels it only when she takes a deep breath.  Physical activity does not precipitate the discomfort.  Her chest is sore to palpation.  She is on prednisone therapy which could be causing additional fluid retention, although she has only taking 5 mg/day.  Past Medical History:  Diagnosis Date  . Anemia   . Anxiety   . Arthritis   . Blood transfusion   . Chest pain    a. Lexiscan Myoview 12/12:  EF 75%, no ischemia or scar;  b.  Echo 12/12:  EF 60-65%, no sig valvular abnormalities   . Closed left fibular fracture    Casted   . Complication of anesthesia 1998   Unable to arouse fully, respiratory depression, pt. reports that the family was told that she would need a trach., but then she stabilized & didn't need it   . Coronary artery disease    sees Dr Aundra Dubin every 6 months  . Depression   . Diabetes mellitus    Greater than 20 years  . H/O hiatal hernia   . Hypercholesteremia   . Hypertension    Greater than 20 years  . Lumbar disc disease    S/p surgery x4;  Sciatica   . Neuropathy due to secondary diabetes ([place])    Hx: of  . Parastomal hernia of ileal conduit ([place]) 2009  . Pneumonia    s/p CABG  . [name] (peripheral vascular disease) ([facility])    Stable claudication. Arteriogram 07/2009 no significant  aortoiliac disease, has bilateral SFA occlusions with distal reconstitution   . Rheumatoid arthritis ([facility])    Dr Berna Bue @ [facility]    Hx: of with exertion  . Transfusion reaction 1960   Convulsions (? seizure)  . UTI (lower urinary tract infection) 01/03/15    Past Surgical History:  Procedure Laterality Date  . ABDOMINAL HYSTERECTOMY    . BACK SURGERY     4 back operations most recently 1995, 1998  . CARDIAC CATHETERIZATION     Hx: of 05/14/13  . CATARACT EXTRACTION W/ INTRAOCULAR LENS  IMPLANT, BILATERAL     Hx: of  . COLONOSCOPY     Hx; of  . CORONARY ARTERY BYPASS GRAFT N/A 05/24/2013   Procedure: CORONARY ARTERY BYPASS GRAFTING (CABG);  Surgeon: Melrose Nakayama, MD;  Location: [facility];  Service: Open Heart Surgery;  Laterality: N/A;  . DILATION AND CURETTAGE OF UTERUS    . HERNIA REPAIR  2009  . LEFT HEART CATHETERIZATION WITH CORONARY ANGIOGRAM N/A 05/14/2013   Procedure: LEFT HEART CATHETERIZATION WITH CORONARY ANGIOGRAM;  Surgeon: Larey Dresser, MD;  Location: [facility] CATH LAB;  Service: Cardiovascular;  Laterality: N/A;  .  TONSILLECTOMY    . TOTAL HIP ARTHROPLASTY Left 01/10/2015   Procedure: TOTAL HIP ARTHROPLASTY ANTERIOR APPROACH;  Surgeon: Melrose Nakayama, MD;  Location: [facility];  Service: Orthopedics;  Laterality: Left;    Current Medications: Current Meds  Medication Sig  . aspirin EC 81 MG tablet Take 1 tablet (81 mg total) by mouth daily.  . benazepril (LOTENSIN) 20 MG tablet Take 20 mg by mouth daily.  . furosemide (LASIX) 40 MG tablet Take 1 tablet (40 mg total) by mouth daily.  Marland Kitchen MELATONIN PO Take 1 tablet by mouth at bedtime.  . metoprolol tartrate (LOPRESSOR) 25 MG tablet Take 0.5 tablets (12.5 mg total) by mouth 2 (two) times daily.  . ONE TOUCH ULTRA TEST test strip   . predniSONE (DELTASONE) 1 MG tablet Take 5 mg by mouth daily with breakfast.   . simvastatin (ZOCOR) 80 MG tablet Take 80 mg by mouth at bedtime.        Allergies:   Patient has no known allergies.   Social History   Socioeconomic History  . Marital status: Married    Spouse name: Not on file  . Number of children: 2  . Years of education: Not on file  . Highest education level: Not on file  Occupational History  . Occupation: Retired    Fish farm manager: RETIRED  Social Needs  . Financial resource strain: Not on file  . Food insecurity    Worry: Not on file    Inability: Not on file  . Transportation needs    Medical: Not on file    Non-medical: Not on file  Tobacco Use  . Smoking status: Former Smoker    Packs/day: 0.50    Years: 10.00    Pack years: 5.00    Types: Cigarettes    Quit date: 09/09/1996    Years since quitting: 22.8  . Smokeless tobacco: Former Systems developer    Types: Chew  Substance and Sexual Activity  . Alcohol use: No  . Drug use: No  . Sexual activity: Not Currently  Lifestyle  . Physical activity    Days per week: Not on file    Minutes per session: Not on file  . Stress: Not on file  Relationships  . Social Herbalist on phone: Not on file    Gets together: Not on file    Attends religious service: Not on file    Active member of club or organization: Not on file    Attends meetings of clubs or organizations: Not on file    Relationship status: Not on file  Other Topics Concern  . Not on file  Social History Narrative   Married and has two children      Family History: The patient's family history includes Cancer in her brother and sister; Coronary artery disease (age of onset: 44) in her mother; Diabetes in her brother and mother; Heart attack in her mother; Heart disease in her brother; Kidney disease in her brother; Stroke in her brother and mother.  ROS:   Please see the history of present illness.    Still concerned about breathing.  States that she still has soreness all over related to combined osteoarthritis and rheumatoid arthritis.  She is on prednisone therapy.  All other systems  reviewed and are negative.  EKGs/Labs/Other Studies Reviewed:    The following studies were reviewed today:  2D Doppler echocardiogram July 26, 2019: IMPRESSIONS    1. Left ventricular ejection fraction, by visual estimation,  is 60 to 65%. The left ventricle has normal function. There is no left ventricular hypertrophy.  2. Elevated left ventricular end-diastolic pressure.  3. Left ventricular diastolic parameters are consistent with Grade II diastolic dysfunction (pseudonormalization).  4. Global right ventricle has normal systolic function.The right ventricular size is normal. No increase in right ventricular wall thickness.  5. Left atrial size was mildly dilated.  6. Right atrial size was normal.  7. The mitral valve is normal in structure. Mild mitral valve regurgitation. No evidence of mitral stenosis.  8. The tricuspid valve is normal in structure. Tricuspid valve regurgitation is mild.  9. The aortic valve is tricuspid. Aortic valve regurgitation is trivial. Mild to moderate aortic valve sclerosis/calcification without any evidence of aortic stenosis. 10. The pulmonic valve was normal in structure. Pulmonic valve regurgitation is not visualized. 11. There is mild dilatation of the ascending aorta measuring 42 mm. 12. Moderately elevated pulmonary artery systolic pressure at 87GOTL. 13. The inferior vena cava is normal in size with greater than 50% respiratory variability, suggesting right atrial pressure of 3 mmHg. 14. The average left ventricular global longitudinal strain is -20.7 %.  In comparison to the previous echocardiogram(s): 05/17/13 EF 60-65%. PA pressure 76mHg  Carotid Doppler study July 21, 2019 .Summary: Right Carotid: Velocities in the right ICA are consistent with a 1-39% stenosis.  Left Carotid: Velocities in the left ICA are consistent with a 1-39% stenosis.               The ECA appears >50% stenosed.  Vertebrals:  Bilateral vertebral arteries  demonstrate antegrade flow. Subclavians: Left subclavian artery was stenotic. Right subclavian artery flow              was disturbed.  EKG:  EKG not performed  Recent Labs: 07/19/2019: BUN 18; Creatinine, Ser 0.88; Hemoglobin 10.6; NT-Pro BNP 1,614; Platelets 208; Potassium 4.2; Sodium 139  Recent Lipid Panel    Component Value Date/Time   CHOL 166 05/13/2014 0932   TRIG 140.0 05/13/2014 0932   HDL 52.50 05/13/2014 0932   CHOLHDL 3 05/13/2014 0932   VLDL 28.0 05/13/2014 0932   LDLCALC 86 05/13/2014 0932    Physical Exam:    VS:  BP (!) 144/82   Pulse 61   Ht '5\' 6"'$  (1.676 m)   Wt 149 lb 6.4 oz (67.8 kg)   SpO2 99%   BMI 24.11 kg/m     Wt Readings from Last 3 Encounters:  07/28/19 149 lb 6.4 oz (67.8 kg)  07/19/19 151 lb 12.8 oz (68.9 kg)  01/18/19 139 lb (63 kg)     GEN: Younger than stated age in appearance. No acute distress HEENT: Normal NECK: No JVD. LYMPHATICS: No lymphadenopathy CARDIAC: Systolic murmur RRR without diastolic murmur, gallop, or edema. VASCULAR:  Normal Pulses. No bruits. RESPIRATORY:  Clear to auscultation without rales, wheezing or rhonchi  ABDOMEN: Soft, non-tender, non-distended, No pulsatile mass, MUSCULOSKELETAL: No deformity  SKIN: Warm and dry NEUROLOGIC:  Alert and oriented x 3 PSYCHIATRIC:  Normal affect   ASSESSMENT:    1. Coronary artery disease involving native coronary artery of native heart without angina pectoris   2. Chronic diastolic CHF (congestive heart failure) ([place]   3. Essential hypertension   4. Bruit of left carotid artery   5. Peripheral vascular disease ([place]   6. Educated about COVID-19 virus infection    PLAN:    In order of problems listed above:  1. Brief discussion of secondary prevention. 2. She  likely has diastolic heart failure based upon elevated BNP and echo demonstrating diastolic dysfunction.  We will plan to add slightly more intense diuretic regimen with spironolactone 12.5 mg/day.  Be met will  be done 7 to 14 days thereafter. 3. Aldactone will help blood pressure. 4. Carotid Doppler study as noted above does not reveal any significant internal carotid stenosis. 5. Not addressed 6. The 3W's is endorsed and practiced.  She will return in 1 month to be reassessed for evidence of volume overload and to determine if dyspnea has improved with marginal increase in diuresis.  There is not much else that we can do for her.     Medication Adjustments/Labs and Tests Ordered: Current medicines are reviewed at length with the patient today.  Concerns regarding medicines are outlined above.  Orders Placed This Encounter  Procedures  . Basic metabolic panel   Meds ordered this encounter  Medications  . spironolactone (ALDACTONE) 25 MG tablet    Sig: Take 0.5 tablets (12.5 mg total) by mouth daily.    Dispense:  45 tablet    Refill:  3    Patient Instructions  Medication Instructions:  1) START Spironolactone 12.38m once daily  *If you need a refill on your cardiac medications before your next appointment, please call your pharmacy*  Lab Work: Your physician recommends that you return for lab work in: 2 weeks.  If you have labs (blood work) drawn today and your tests are completely normal, you will receive your results only by: .Marland KitchenMyChart Message (if you have MyChart) OR . A paper copy in the mail If you have any lab test that is abnormal or we need to change your treatment, we will call you to review the results.  Testing/Procedures: None  Follow-Up:  Your physician recommends that you schedule a follow-up appointment in: 1 month with LTruitt Merle NP, LCecilie Kicks NP or CSande Rives PA-C.   [facility] and your health needs are our priority.  As part of our continuing mission to provide you with exceptional heart care, we have created designated Provider Care Teams.  These Care Teams include your primary Cardiologist (physician) and Advanced Practice  Providers (APPs -  Physician Assistants and Nurse Practitioners) who all work together to provide you with the care you need, when you need it.  Your next appointment:   1 year  The format for your next appointment:   In Person  Provider:   You may see HSinclair Grooms MD or one of the following Advanced Practice Providers on your designated Care Team:    LTruitt Merle NP  LCecilie Kicks NP  JKathyrn Drown NP   Other Instructions      Signed, HSinclair Grooms MD  07/28/2019 5:23 PM    [facility]

## 2019-07-28 ENCOUNTER — Ambulatory Visit: Payer: Medicare Other | Admitting: Interventional Cardiology

## 2019-07-28 ENCOUNTER — Encounter: Payer: Self-pay | Admitting: Interventional Cardiology

## 2019-07-28 ENCOUNTER — Other Ambulatory Visit: Payer: Self-pay

## 2019-07-28 VITALS — BP 144/82 | HR 61 | Ht 66.0 in | Wt 149.4 lb

## 2019-07-28 DIAGNOSIS — I2510 Atherosclerotic heart disease of native coronary artery without angina pectoris: Secondary | ICD-10-CM | POA: Diagnosis not present

## 2019-07-28 DIAGNOSIS — Z7189 Other specified counseling: Secondary | ICD-10-CM

## 2019-07-28 DIAGNOSIS — I110 Hypertensive heart disease with heart failure: Secondary | ICD-10-CM

## 2019-07-28 DIAGNOSIS — I739 Peripheral vascular disease, unspecified: Secondary | ICD-10-CM

## 2019-07-28 DIAGNOSIS — I10 Essential (primary) hypertension: Secondary | ICD-10-CM

## 2019-07-28 DIAGNOSIS — I5032 Chronic diastolic (congestive) heart failure: Secondary | ICD-10-CM

## 2019-07-28 DIAGNOSIS — R0989 Other specified symptoms and signs involving the circulatory and respiratory systems: Secondary | ICD-10-CM

## 2019-07-28 MED ORDER — SPIRONOLACTONE 25 MG PO TABS: 12.5000 mg | ORAL_TABLET | Freq: Every day | ORAL | 3 refills | Status: DC

## 2019-07-28 NOTE — Patient Instructions (Signed)
Medication Instructions:  1) START Spironolactone 12.5mg  once daily  *If you need a refill on your cardiac medications before your next appointment, please call your pharmacy*  Lab Work: Your physician recommends that you return for lab work in: 2 weeks.  If you have labs (blood work) drawn today and your tests are completely normal, you will receive your results only by: Marland Kitchen MyChart Message (if you have MyChart) OR . A paper copy in the mail If you have any lab test that is abnormal or we need to change your treatment, we will call you to review the results.  Testing/Procedures: None  Follow-Up:  Your physician recommends that you schedule a follow-up appointment in: 1 month with Truitt Merle, NP, Cecilie Kicks, NP or Sande Rives, PA-C.   At [facility], you and your health needs are our priority.  As part of our continuing mission to provide you with exceptional heart care, we have created designated Provider Care Teams.  These Care Teams include your primary Cardiologist (physician) and Advanced Practice Providers (APPs -  Physician Assistants and Nurse Practitioners) who all work together to provide you with the care you need, when you need it.  Your next appointment:   1 year  The format for your next appointment:   In Person  Provider:   You may see Sinclair Grooms, MD or one of the following Advanced Practice Providers on your designated Care Team:    Truitt Merle, NP  Cecilie Kicks, NP  Kathyrn Drown, NP   Other Instructions

## 2019-08-11 ENCOUNTER — Other Ambulatory Visit: Payer: Self-pay

## 2019-08-11 ENCOUNTER — Other Ambulatory Visit: Payer: Medicare Other | Admitting: *Deleted

## 2019-08-11 DIAGNOSIS — I5032 Chronic diastolic (congestive) heart failure: Secondary | ICD-10-CM

## 2019-08-12 LAB — BASIC METABOLIC PANEL
BUN/Creatinine Ratio: 19 (ref 12–28)
BUN: 24 mg/dL (ref 8–27)
CO2: 27 mmol/L (ref 20–29)
Calcium: 9.6 mg/dL (ref 8.7–10.3)
Chloride: 96 mmol/L (ref 96–106)
Creatinine, Ser: 1.25 mg/dL — ABNORMAL HIGH (ref 0.57–1.00)
GFR calc Af Amer: 46 mL/min/{1.73_m2} — ABNORMAL LOW (ref >59)
GFR calc non Af Amer: 40 mL/min/{1.73_m2} — ABNORMAL LOW (ref >59)
Glucose: 371 mg/dL — ABNORMAL HIGH (ref 65–99)
Potassium: 4.5 mmol/L (ref 3.5–5.2)
Sodium: 136 mmol/L (ref 134–144)

## 2019-08-30 NOTE — Progress Notes (Signed)
Cardiology Office Note   Date:  08/31/2019   ID:  JEWELISA BARMANN, DOB [birth year], MRN [id number]  PCP:  Haywood Pao, MD  Cardiologist:  Dr. Tamala Julian     Chief Complaint  Patient presents with  . Coronary Artery Disease      History of Present Illness: Patricia Villegas is a 84 y.o. female who presents for edema   She has a hx of DM II, hypertension, hyperlipidemia, and CAD/CABG.  She has had progressive shortness of breath over the past 6 to 12 months.  She was seen by Sande Rives and had a work-up that included an echocardiogram and BNP.  She does not have orthopnea.  She complains of soreness in her chest.  She feels it only when she takes a deep breath.  Physical activity does not precipitate the discomfort.  Her chest is sore to palpation.  She is on prednisone therapy which could be causing additional fluid retention, although she has only taking 5 mg/day Dr. Tamala Julian thought diastolic HF, with elevated BNP, spironolactone 12.5 mg added.  Cr increased from 0.88 to 1.25 K+ 4.5  Lasix decreased to 20 mg daily  Mild anemia   On her labs her glucose is significantly elevated as well.  She is to see Dr. Osborne Casco next month.     Today much improved though still some chest pain with deep breath but only with deep breath.   Her CXR was clear with these symptoms, she is doing well on decreased lasix and aldactone.  Will check BMP today.  No lightheadedness.  No chest pain.  No significant SOB.  Has not taken her BP meds today.  She will take on return home.   Past Medical History:  Diagnosis Date  . Anemia   . Anxiety   . Arthritis   . Blood transfusion   . Chest pain    a. Lexiscan Myoview 12/12:  EF 75%, no ischemia or scar;  b.  Echo 12/12:  EF 60-65%, no sig valvular abnormalities   . Closed left fibular fracture    Casted   . Complication of anesthesia 1998   Unable to arouse fully, respiratory depression, pt. reports that the family was told that she would need a  trach., but then she stabilized & didn't need it   . Coronary artery disease    sees Dr Aundra Dubin every 6 months  . Depression   . Diabetes mellitus    Greater than 20 years  . H/O hiatal hernia   . Hypercholesteremia   . Hypertension    Greater than 20 years  . Lumbar disc disease    S/p surgery x4;  Sciatica   . Neuropathy due to secondary diabetes ([facility])    Hx: of  . Parastomal hernia of ileal conduit ([facility]) 2009  . Pneumonia    s/p CABG  . [name] (peripheral vascular disease) ([facility])    Stable claudication. Arteriogram 07/2009 no significant aortoiliac disease, has bilateral SFA occlusions with distal reconstitution   . Rheumatoid arthritis ([facility])    Dr Berna Bue @ [facility] of breath    Hx: of with exertion  . Transfusion reaction 1960   Convulsions (? seizure)  . UTI (lower urinary tract infection) 01/03/15    Past Surgical History:  Procedure Laterality Date  . ABDOMINAL HYSTERECTOMY    . BACK SURGERY     4 back operations most recently 1995, 1998  . CARDIAC CATHETERIZATION  Hx: of 05/14/13  . CATARACT EXTRACTION W/ INTRAOCULAR LENS  IMPLANT, BILATERAL     Hx: of  . COLONOSCOPY     Hx; of  . CORONARY ARTERY BYPASS GRAFT N/A 05/24/2013   Procedure: CORONARY ARTERY BYPASS GRAFTING (CABG);  Surgeon: Melrose Nakayama, MD;  Location: [facility];  Service: Open Heart Surgery;  Laterality: N/A;  . DILATION AND CURETTAGE OF UTERUS    . HERNIA REPAIR  2009  . LEFT HEART CATHETERIZATION WITH CORONARY ANGIOGRAM N/A 05/14/2013   Procedure: LEFT HEART CATHETERIZATION WITH CORONARY ANGIOGRAM;  Surgeon: Larey Dresser, MD;  Location: [facility] CATH LAB;  Service: Cardiovascular;  Laterality: N/A;  . TONSILLECTOMY    . TOTAL HIP ARTHROPLASTY Left 01/10/2015   Procedure: TOTAL HIP ARTHROPLASTY ANTERIOR APPROACH;  Surgeon: Melrose Nakayama, MD;  Location: [facility];  Service: Orthopedics;  Laterality: Left;     Current Outpatient Medications  Medication Sig Dispense Refill  .  aspirin EC 81 MG tablet Take 1 tablet (81 mg total) by mouth daily.    . benazepril (LOTENSIN) 20 MG tablet Take 20 mg by mouth daily.    . furosemide (LASIX) 20 MG tablet Take 20 mg by mouth.    . MELATONIN PO Take 1 tablet by mouth at bedtime.    . metoprolol tartrate (LOPRESSOR) 25 MG tablet Take 0.5 tablets (12.5 mg total) by mouth 2 (two) times daily. 90 tablet 3  . ONE TOUCH ULTRA TEST test strip     . predniSONE (DELTASONE) 1 MG tablet Take 5 mg by mouth daily with breakfast.     . simvastatin (ZOCOR) 80 MG tablet Take 80 mg by mouth at bedtime.      Marland Kitchen spironolactone (ALDACTONE) 25 MG tablet Take 0.5 tablets (12.5 mg total) by mouth daily. 45 tablet 3   No current facility-administered medications for this visit.    Allergies:   Patient has no known allergies.    Social History:  The patient  reports that she quit smoking about 22 years ago. Her smoking use included cigarettes. She has a 5.00 pack-year smoking history. She has quit using smokeless tobacco.  Her smokeless tobacco use included chew. She reports that she does not drink alcohol or use drugs.   Family History:  The patient's family history includes Cancer in her brother and sister; Coronary artery disease (age of onset: 34) in her mother; Diabetes in her brother and mother; Heart attack in her mother; Heart disease in her brother; Kidney disease in her brother; Stroke in her brother and mother.    ROS:  General:no colds or fevers, no weight changes Skin:no rashes or ulcers HEENT:no blurred vision, no congestion CV:see HPI PUL:see HPI GI:no diarrhea constipation or melena, no indigestion GU:no hematuria, no dysuria MS:+ joint pain + arthritis increased today, no claudication Neuro:no syncope, no lightheadedness Endo:+ diabetes, no thyroid disease  Wt Readings from Last 3 Encounters:  08/31/19 150 lb (68 kg)  07/28/19 149 lb 6.4 oz (67.8 kg)  07/19/19 151 lb 12.8 oz (68.9 kg)     PHYSICAL EXAM: VS:  BP (!)  204/70   Pulse 75   Ht 5\' 6"  (1.676 m)   Wt 150 lb (68 kg)   SpO2 96%   BMI 24.21 kg/m  , BMI Body mass index is 24.21 kg/m. General:Pleasant affect, NAD Skin:Warm and dry, brisk capillary refill HEENT:normocephalic, sclera clear, mucus membranes moist Neck:supple, no JVD, no bruits  Heart:S1S2 RRR with soft systolic murmur, no gallup, rub or click  Lungs:clear without rales, rhonchi, or wheezes [id number], non tender, + BS, do not palpate liver spleen or masses Ext:no lower ext edema, 2+ pedal pulses, 2+ radial pulses Neuro:alert and oriented X 3, MAE, follows commands, + facial symmetry    EKG:  EKG is NOT ordered today.    Recent Labs: 07/19/2019: Hemoglobin 10.6; NT-Pro BNP 1,614; Platelets 208 08/11/2019: BUN 24; Creatinine, Ser 1.25; Potassium 4.5; Sodium 136    Lipid Panel    Component Value Date/Time   CHOL 166 05/13/2014 0932   TRIG 140.0 05/13/2014 0932   HDL 52.50 05/13/2014 0932   CHOLHDL 3 05/13/2014 0932   VLDL 28.0 05/13/2014 0932   LDLCALC 86 05/13/2014 0932       Other studies Reviewed: Additional studies/ records that were reviewed today include: .  Carotid dopplers 07/21/19 Summary: Right Carotid: Velocities in the right ICA are consistent with a 1-39% stenosis.  Left Carotid: Velocities in the left ICA are consistent with a 1-39% stenosis.               The ECA appears >50% stenosed.  Vertebrals:  Bilateral vertebral arteries demonstrate antegrade flow. Subclavians: Left subclavian artery was stenotic. Right subclavian artery flow              was disturbed.  Echo 07/26/19 IMPRESSIONS    1. Left ventricular ejection fraction, by visual estimation, is 60 to 65%. The left ventricle has normal function. There is no left ventricular hypertrophy.  2. Elevated left ventricular end-diastolic pressure.  3. Left ventricular diastolic parameters are consistent with Grade II diastolic dysfunction (pseudonormalization).  4. Global right ventricle  has normal systolic function.The right ventricular size is normal. No increase in right ventricular wall thickness.  5. Left atrial size was mildly dilated.  6. Right atrial size was normal.  7. The mitral valve is normal in structure. Mild mitral valve regurgitation. No evidence of mitral stenosis.  8. The tricuspid valve is normal in structure. Tricuspid valve regurgitation is mild.  9. The aortic valve is tricuspid. Aortic valve regurgitation is trivial. Mild to moderate aortic valve sclerosis/calcification without any evidence of aortic stenosis. 10. The pulmonic valve was normal in structure. Pulmonic valve regurgitation is not visualized. 11. There is mild dilatation of the ascending aorta measuring 42 mm. 12. Moderately elevated pulmonary artery systolic pressure at [id number]. 13. The inferior vena cava is normal in size with greater than 50% respiratory variability, suggesting right atrial pressure of 3 mmHg. 14. The average left ventricular global longitudinal strain is -20.7 %.  In comparison to the previous echocardiogram(s): 05/17/13 EF 60-65%. PA pressure 23mmHg. FINDINGS  Left Ventricle: Left ventricular ejection fraction, by visual estimation, is 60 to 65%. The left ventricle has normal function. The average left ventricular global longitudinal strain is -20.7 %. There is no left ventricular hypertrophy. Left  ventricular diastolic parameters are consistent with Grade II diastolic dysfunction (pseudonormalization). Elevated left ventricular end-diastolic pressure.  Right Ventricle: The right ventricular size is normal. No increase in right ventricular wall thickness. Global RV systolic function is has normal systolic function. The tricuspid regurgitant velocity is 3.21 m/s, and with an assumed right atrial pressure  of 3 mmHg, the estimated right ventricular systolic pressure is moderately elevated at 44.2 mmHg.  Left Atrium: Left atrial size was mildly dilated.  Right Atrium:  Right atrial size was normal in size  Pericardium: There is no evidence of pericardial effusion.  Mitral Valve: The mitral valve is normal in structure. No evidence of  mitral valve stenosis by observation. MV peak gradient, 12.8 mmHg. Mild mitral valve regurgitation.  Tricuspid Valve: The tricuspid valve is normal in structure. Tricuspid valve regurgitation is mild.  Aortic Valve: The aortic valve is tricuspid. Aortic valve regurgitation is trivial. Mild to moderate aortic valve sclerosis/calcification is present, without any evidence of aortic stenosis.  Pulmonic Valve: The pulmonic valve was normal in structure. Pulmonic valve regurgitation is not visualized.  Aorta: Aortic dilatation noted. There is mild dilatation of the ascending aorta measuring 42 mm.  Venous: The inferior vena cava is normal in size with greater than 50% respiratory variability, suggesting right atrial pressure of 3 mmHg.  IAS/Shunts: No atrial level shunt detected by color flow Doppler. No ventricular septal defect is seen or detected. There is no evidence of an atrial septal defect.   ASSESSMENT AND PLAN:  1.  Chronic diastolic HF.  Improved and doing well on lower dose of lasix and new spironolactone.  Will recheck BMP and pro BNP,   2.  HTN elevated today with arthritic pain and has not taken her medications this AM.  Will take upon arrival home.    3.  DM-2 with no meds, had been controlled with diet but running high on BMP, will check Hgb A 1 C with lab draw today.  4.  Chest pain with deep breath, CXR without  Acute disease  5.  CAD no angina   6.  COVID education, pt practices 3 Ws and dose plan to discuss vaccine with Dr. Osborne Casco.     Current medicines are reviewed with the patient today.  The patient Has no concerns regarding medicines.  The following changes have been made:  See above Labs/ tests ordered today include:see above  Disposition:   FU:  see above  Signed, Cecilie Kicks,  NP  08/31/2019 10:48 AM    [facility], [place], [place], [place] Phone: [phone]; Fax: [phone]

## 2019-08-31 ENCOUNTER — Ambulatory Visit: Payer: Medicare Other | Admitting: Cardiology

## 2019-08-31 ENCOUNTER — Other Ambulatory Visit: Payer: Self-pay

## 2019-08-31 ENCOUNTER — Encounter: Payer: Self-pay | Admitting: Cardiology

## 2019-08-31 VITALS — BP 204/70 | HR 75 | Ht 66.0 in | Wt 150.0 lb

## 2019-08-31 DIAGNOSIS — Z7189 Other specified counseling: Secondary | ICD-10-CM | POA: Diagnosis not present

## 2019-08-31 DIAGNOSIS — I110 Hypertensive heart disease with heart failure: Secondary | ICD-10-CM | POA: Diagnosis not present

## 2019-08-31 DIAGNOSIS — I5032 Chronic diastolic (congestive) heart failure: Secondary | ICD-10-CM

## 2019-08-31 DIAGNOSIS — I2510 Atherosclerotic heart disease of native coronary artery without angina pectoris: Secondary | ICD-10-CM

## 2019-08-31 DIAGNOSIS — E119 Type 2 diabetes mellitus without complications: Secondary | ICD-10-CM

## 2019-08-31 DIAGNOSIS — I10 Essential (primary) hypertension: Secondary | ICD-10-CM

## 2019-08-31 NOTE — Patient Instructions (Addendum)
Medication Instructions:  Your physician recommends that you continue on your current medications as directed. Please refer to the Current Medication list given to you today.  *If you need a refill on your cardiac medications before your next appointment, please call your pharmacy*  Lab Work: TODAY:  BMET, HGBA1C, & PRO BNP  If you have labs (blood work) drawn today and your tests are completely normal, you will receive your results only by: Marland Kitchen MyChart Message (if you have MyChart) OR . A paper copy in the mail If you have any lab test that is abnormal or we need to change your treatment, we will call you to review the results.  Testing/Procedures: None ordered  Follow-Up: At [facility], you and your health needs are our priority.  As part of our continuing mission to provide you with exceptional heart care, we have created designated Provider Care Teams.  These Care Teams include your primary Cardiologist (physician) and Advanced Practice Providers (APPs -  Physician Assistants and Nurse Practitioners) who all work together to provide you with the care you need, when you need it.  Your next appointment:     11/29/2019 ARRIVE AT 10:15   The format for your next appointment:   In Person  Provider:   Daneen Schick, MD  Other Instructions

## 2019-09-01 LAB — BASIC METABOLIC PANEL
BUN/Creatinine Ratio: 17 (ref 12–28)
BUN: 14 mg/dL (ref 8–27)
CO2: 25 mmol/L (ref 20–29)
Calcium: 9.3 mg/dL (ref 8.7–10.3)
Chloride: 100 mmol/L (ref 96–106)
Creatinine, Ser: 0.81 mg/dL (ref 0.57–1.00)
GFR calc Af Amer: 78 mL/min/{1.73_m2} (ref >59)
GFR calc non Af Amer: 68 mL/min/{1.73_m2} (ref >59)
Glucose: 166 mg/dL — ABNORMAL HIGH (ref 65–99)
Potassium: 4 mmol/L (ref 3.5–5.2)
Sodium: 138 mmol/L (ref 134–144)

## 2019-09-01 LAB — HEMOGLOBIN A1C
Est. average glucose Bld gHb Est-mCnc: 258 mg/dL
Hgb A1c MFr Bld: 10.6 % — ABNORMAL HIGH (ref 4.8–5.6)

## 2019-09-01 LAB — PRO B NATRIURETIC PEPTIDE: NT-Pro BNP: 980 pg/mL — ABNORMAL HIGH (ref 0–738)

## 2019-11-29 ENCOUNTER — Encounter: Payer: Self-pay | Admitting: Interventional Cardiology

## 2019-11-29 ENCOUNTER — Encounter (INDEPENDENT_AMBULATORY_CARE_PROVIDER_SITE_OTHER): Payer: Self-pay

## 2019-11-29 ENCOUNTER — Other Ambulatory Visit: Payer: Self-pay

## 2019-11-29 ENCOUNTER — Ambulatory Visit: Payer: Medicare Other | Admitting: Interventional Cardiology

## 2019-11-29 VITALS — BP 152/64 | HR 60 | Ht 66.0 in | Wt 147.0 lb

## 2019-11-29 DIAGNOSIS — E119 Type 2 diabetes mellitus without complications: Secondary | ICD-10-CM

## 2019-11-29 DIAGNOSIS — I5032 Chronic diastolic (congestive) heart failure: Secondary | ICD-10-CM | POA: Diagnosis not present

## 2019-11-29 DIAGNOSIS — I10 Essential (primary) hypertension: Secondary | ICD-10-CM | POA: Diagnosis not present

## 2019-11-29 DIAGNOSIS — I2510 Atherosclerotic heart disease of native coronary artery without angina pectoris: Secondary | ICD-10-CM | POA: Diagnosis not present

## 2019-11-29 DIAGNOSIS — E782 Mixed hyperlipidemia: Secondary | ICD-10-CM | POA: Diagnosis not present

## 2019-11-29 DIAGNOSIS — Z7189 Other specified counseling: Secondary | ICD-10-CM

## 2019-11-29 NOTE — Progress Notes (Signed)
Cardiology Office Note:    Date:  11/29/2019   ID:  Patricia Villegas, DOB 1935/01/05, MRN [id number]  PCP:  Haywood Pao, MD  Cardiologist:  Sinclair Grooms, MD   Referring MD: Haywood Pao, MD   No chief complaint on file.   History of Present Illness:    Patricia Villegas is a 85 y.o. female with a hx ofDM II, hypertension, hyperlipidemia, chronic diastolic heart failure, and CAD/CABG (2014)..  She feels she is doing well.  She has been sheltering during the pandemic.  When she saw Sande Rives she complained of exertional fatigue and dyspnea.  She says a "bunch of tests were then performed".  This evaluation included an echocardiogram, carotid Doppler, and blood work.  No significant findings were identified.  She now feels better.  She has some adjustment in her diuretic regimen.  She has not had angina.  Past Medical History:  Diagnosis Date  . Anemia   . Anxiety   . Arthritis   . Blood transfusion   . Chest pain    a. Lexiscan Myoview 12/12:  EF 75%, no ischemia or scar;  b.  Echo 12/12:  EF 60-65%, no sig valvular abnormalities   . Closed left fibular fracture    Casted   . Complication of anesthesia 1998   Unable to arouse fully, respiratory depression, pt. reports that the family was told that she would need a trach., but then she stabilized & didn't need it   . Coronary artery disease    sees Dr Aundra Dubin every 6 months  . Depression   . Diabetes mellitus    Greater than 20 years  . H/O hiatal hernia   . Hypercholesteremia   . Hypertension    Greater than 20 years  . Lumbar disc disease    S/p surgery x4;  Sciatica   . Neuropathy due to secondary diabetes ([place])    Hx: of  . Parastomal hernia of ileal conduit ([place]) 2009  . Pneumonia    s/p CABG  . [name] (peripheral vascular disease) (HCC)    Stable claudication. Arteriogram 07/2009 no significant aortoiliac disease, has bilateral SFA occlusions with distal reconstitution   . Rheumatoid arthritis (HCC)     Dr Berna Bue @ [facility] of breath    Hx: of with exertion  . Transfusion reaction 1960   Convulsions (? seizure)  . UTI (lower urinary tract infection) 01/03/15    Past Surgical History:  Procedure Laterality Date  . ABDOMINAL HYSTERECTOMY    . BACK SURGERY     4 back operations most recently 1995, 1998  . CARDIAC CATHETERIZATION     Hx: of 05/14/13  . CATARACT EXTRACTION W/ INTRAOCULAR LENS  IMPLANT, BILATERAL     Hx: of  . COLONOSCOPY     Hx; of  . CORONARY ARTERY BYPASS GRAFT N/A 05/24/2013   Procedure: CORONARY ARTERY BYPASS GRAFTING (CABG);  Surgeon: Melrose Nakayama, MD;  Location: [facility];  Service: Open Heart Surgery;  Laterality: N/A;  . DILATION AND CURETTAGE OF UTERUS    . HERNIA REPAIR  2009  . LEFT HEART CATHETERIZATION WITH CORONARY ANGIOGRAM N/A 05/14/2013   Procedure: LEFT HEART CATHETERIZATION WITH CORONARY ANGIOGRAM;  Surgeon: Larey Dresser, MD;  [facility] CATH LAB;  Service: Cardiovascular;  Laterality: N/A;  . TONSILLECTOMY    . TOTAL HIP ARTHROPLASTY Left 01/10/2015   Procedure: TOTAL HIP ARTHROPLASTY ANTERIOR APPROACH;  Surgeon: Melrose Nakayama, MD;  Location: [facility];  Service: Orthopedics;  Laterality: Left;    Current Medications: Current Meds  Medication Sig  . allopurinol (ZYLOPRIM) 100 MG tablet Take 200 mg by mouth 2 (two) times daily.  Marland Kitchen aspirin EC 81 MG tablet Take 1 tablet (81 mg total) by mouth daily.  . benazepril (LOTENSIN) 20 MG tablet Take 20 mg by mouth daily.  . furosemide (LASIX) 20 MG tablet Take 20 mg by mouth.  . hydroxychloroquine (PLAQUENIL) 200 MG tablet Take 200 mg by mouth 2 (two) times daily.  Marland Kitchen LANTUS SOLOSTAR 100 UNIT/ML Solostar Pen Inject 10 Units into the skin daily.  . methotrexate 2.5 MG tablet Take 10 tablets by mouth once a week.  . metoprolol tartrate (LOPRESSOR) 25 MG tablet Take 0.5 tablets (12.5 mg total) by mouth 2 (two) times daily.  . ONE TOUCH ULTRA TEST test strip   . predniSONE  (DELTASONE) 1 MG tablet Take 5 mg by mouth daily with breakfast.   . simvastatin (ZOCOR) 80 MG tablet Take 80 mg by mouth at bedtime.    Marland Kitchen spironolactone (ALDACTONE) 25 MG tablet Take 0.5 tablets (12.5 mg total) by mouth daily.     Allergies:   Patient has no known allergies.   Social History   Socioeconomic History  . Marital status: Married    Spouse name: Not on file  . Number of children: 2  . Years of education: Not on file  . Highest education level: Not on file  Occupational History  . Occupation: Retired    Fish farm manager: RETIRED  Tobacco Use  . Smoking status: Former Smoker    Packs/day: 0.50    Years: 10.00    Pack years: 5.00    Types: Cigarettes    Quit date: 09/09/1996    Years since quitting: 23.2  . Smokeless tobacco: Former Systems developer    Types: Chew  Substance and Sexual Activity  . Alcohol use: No  . Drug use: No  . Sexual activity: Not Currently  Other Topics Concern  . Not on file  Social History Narrative   Married and has two children    Social Determinants of Health   Financial Resource Strain:   . Difficulty of Paying Living Expenses:   Food Insecurity:   . Worried About Charity fundraiser in the Last Year:   . Arboriculturist in the Last Year:   Transportation Needs:   . Film/video editor (Medical):   Marland Kitchen Lack of Transportation (Non-Medical):   Physical Activity:   . Days of Exercise per Week:   . Minutes of Exercise per Session:   Stress:   . Feeling of Stress :   Social Connections:   . Frequency of Communication with Friends and Family:   . Frequency of Social Gatherings with Friends and Family:   . Attends Religious Services:   . Active Member of Clubs or Organizations:   . Attends Archivist Meetings:   Marland Kitchen Marital Status:      Family History: The patient's family history includes Cancer in her brother and sister; Coronary artery disease (age of onset: 57) in her mother; Diabetes in her brother and mother; Heart attack in her  mother; Heart disease in her brother; Kidney disease in her brother; Stroke in her brother and mother.  ROS:   Please see the history of present illness.    Physical activity is limited by her left hip.  She has had her right hip operated upon.  She is  considering the left hip.  She has a chronic inflammatory disease and is on methotrexate, hydroxychloroquine, allopurinol.  She is compliant with her medication regimen.  All other systems reviewed and are negative.  EKGs/Labs/Other Studies Reviewed:    The following studies were reviewed today: 2D Doppler echocardiogram completed in November revealed diastolic dysfunction but no significant unknown abnormalities.  EKG:  EKG performed in November demonstrated left anterior hemiblock, interventricular conduction delay associated with that, poor R wave progression, but no significant or acute abnormality.  Recent Labs: 07/19/2019: Hemoglobin 10.6; Platelets 208 08/31/2019: BUN 14; Creatinine, Ser 0.81; NT-Pro BNP 980; Potassium 4.0; Sodium 138  Recent Lipid Panel    Component Value Date/Time   CHOL 166 05/13/2014 0932   TRIG 140.0 05/13/2014 0932   HDL 52.50 05/13/2014 0932   CHOLHDL 3 05/13/2014 0932   VLDL 28.0 05/13/2014 0932   LDLCALC 86 05/13/2014 0932    Physical Exam:    VS:  BP (!) 152/64   Pulse 60   Ht 5\' 6"  (1.676 m)   Wt 147 lb (66.7 kg)   SpO2 100%   BMI 23.73 kg/m     Wt Readings from Last 3 Encounters:  11/29/19 147 lb (66.7 kg)  08/31/19 150 lb (68 kg)  07/28/19 149 lb 6.4 oz (67.8 kg)     GEN: Appears younger than stated age. No acute distress HEENT: Normal NECK: No JVD. LYMPHATICS: No lymphadenopathy CARDIAC:  RRR without murmur, gallop, or edema. VASCULAR:  Normal Pulses. No bruits. RESPIRATORY:  Clear to auscultation without rales, wheezing or rhonchi  ABDOMEN: Soft, non-tender, non-distended, No pulsatile mass, MUSCULOSKELETAL: No deformity  SKIN: Warm and dry NEUROLOGIC:  Alert and oriented x  3 PSYCHIATRIC:  Normal affect   ASSESSMENT:    1. Coronary artery disease involving native coronary artery of native heart without angina pectoris   2. Chronic diastolic CHF (congestive heart failure) ([place])   3. Essential hypertension   4. Mixed hyperlipidemia   5. Type 2 diabetes mellitus without complication, without long-term current use of insulin (HCC)   6. Educated about COVID-19 virus infection    PLAN:    In order of problems listed above:  1. Secondary prevention was discussed.  Physical activity was highlighted. 2. There is no evidence of volume overload. 3. Blood pressure target 140/80 mmHg.  She is running slightly above.  Decrease salt in diet is recommended.  Monitor blood pressure at home.  Call if consistently above 150/80 mmHg. 4. Continue statin therapy and target LDL is less than or equal to 70.  Was 49 in July. 5. Hemoglobin A1c target less than 7.5 at her age.  Consider SGLT2 therapy. 6. I encouraged the COVID-19 vaccine.  She has pretty much decided that she will get it.  Social distancing should continue to be practiced.  Overall education and awareness concerning primary/secondary risk prevention was discussed in detail: LDL less than 70, hemoglobin A1c less than 7, blood pressure target less than 130/80 mmHg, >150 minutes of moderate aerobic activity per week, avoidance of smoking, weight control (via diet and exercise), and continued surveillance/management of/for obstructive sleep apnea.    Medication Adjustments/Labs and Tests Ordered: Current medicines are reviewed at length with the patient today.  Concerns regarding medicines are outlined above.  No orders of the defined types were placed in this encounter.  No orders of the defined types were placed in this encounter.   Patient Instructions  Medication Instructions:  Your physician recommends that you continue on  your current medications as directed. Please refer to the Current Medication list given  to you today.  *If you need a refill on your cardiac medications before your next appointment, please call your pharmacy*   Lab Work: None If you have labs (blood work) drawn today and your tests are completely normal, you will receive your results only by: Marland Kitchen MyChart Message (if you have MyChart) OR . A paper copy in the mail If you have any lab test that is abnormal or we need to change your treatment, we will call you to review the results.   Testing/Procedures: None   Follow-Up: At [facility], you and your health needs are our priority.  As part of our continuing mission to provide you with exceptional heart care, we have created designated Provider Care Teams.  These Care Teams include your primary Cardiologist (physician) and Advanced Practice Providers (APPs -  Physician Assistants and Nurse Practitioners) who all work together to provide you with the care you need, when you need it.  We recommend signing up for the patient portal called "MyChart".  Sign up information is provided on this After Visit Summary.  MyChart is used to connect with patients for Virtual Visits (Telemedicine).  Patients are able to view lab/test results, encounter notes, upcoming appointments, etc.  Non-urgent messages can be sent to your provider as well.   To learn more about what you can do with MyChart, go to [email or web address].    Your next appointment:   9-12 month(s)  The format for your next appointment:   In Person  Provider:   You may see Sinclair Grooms, MD or one of the following Advanced Practice Providers on your designated Care Team:    Truitt Merle, NP  Cecilie Kicks, NP  Kathyrn Drown, NP    Other Instructions      Signed, Sinclair Grooms, MD  11/29/2019 11:21 AM    [place]

## 2019-11-29 NOTE — Patient Instructions (Signed)
Medication Instructions:  Your physician recommends that you continue on your current medications as directed. Please refer to the Current Medication list given to you today.  *If you need a refill on your cardiac medications before your next appointment, please call your pharmacy*   Lab Work: None If you have labs (blood work) drawn today and your tests are completely normal, you will receive your results only by: . MyChart Message (if you have MyChart) OR . A paper copy in the mail If you have any lab test that is abnormal or we need to change your treatment, we will call you to review the results.   Testing/Procedures: None   Follow-Up: At [facility], you and your health needs are our priority.  As part of our continuing mission to provide you with exceptional heart care, we have created designated Provider Care Teams.  These Care Teams include your primary Cardiologist (physician) and Advanced Practice Providers (APPs -  Physician Assistants and Nurse Practitioners) who all work together to provide you with the care you need, when you need it.  We recommend signing up for the patient portal called "MyChart".  Sign up information is provided on this After Visit Summary.  MyChart is used to connect with patients for Virtual Visits (Telemedicine).  Patients are able to view lab/test results, encounter notes, upcoming appointments, etc.  Non-urgent messages can be sent to your provider as well.   To learn more about what you can do with MyChart, go to [email or web address].    Your next appointment:   9-12 month(s)  The format for your next appointment:   In Person  Provider:   You may see Henry W Smith III, MD or one of the following Advanced Practice Providers on your designated Care Team:    Lori Gerhardt, NP  Laura Ingold, NP  Jill McDaniel, NP    Other Instructions   

## 2020-04-28 ENCOUNTER — Other Ambulatory Visit: Payer: Self-pay | Admitting: Orthopaedic Surgery

## 2020-04-28 ENCOUNTER — Telehealth: Payer: Self-pay | Admitting: Interventional Cardiology

## 2020-04-28 NOTE — Telephone Encounter (Signed)
° °  [facility] Pre-operative Risk Assessment    HEARTCARE STAFF: - Please ensure there is not already an duplicate clearance open for this procedure. - Under Visit Info/Reason for Call, type in Other and utilize the format Clearance MM/DD/YY or Clearance TBD. Do not use dashes or single digits. - If request is for dental extraction, please clarify the # of teeth to be extracted.  Request for surgical clearance:  1. What type of surgery is being performed? Right Hip Arthroplasty  2. When is this surgery scheduled? 05/30/20  3. What type of clearance is required (medical clearance vs. Pharmacy clearance to hold med vs. Both)? both  4. Are there any medications that need to be held prior to surgery and how long? Aspirin - up to Dr. Tamala Julian how long.   5. Practice name and name of physician performing surgery? Dr. Melrose Nakayama at [facility]. What is the office phone number? [phone]   7.   What is the office fax number? [phone]  8.   Anesthesia type (None, local, MAC, general) ? Spinal    Leah Newnam 04/28/2020, 11:25 AM  _________________________________________________________________   (provider comments below)

## 2020-05-01 NOTE — Telephone Encounter (Signed)
   Primary Cardiologist: Sinclair Grooms, MD  Chart reviewed and patient contacted by phone today as part of pre-operative protocol coverage. Given past medical history and time since last visit, based on ACC/AHA guidelines, Patricia Villegas would be at acceptable risk for the planned procedure without further cardiovascular testing.   In general we would prefer the patient remain on aspirin during the perioperative period but OK to hold 5-7 days pre op if needed.   I will route this recommendation to the requesting party via Epic fax function and remove from pre-op pool.  Please call with questions.  Kerin Ransom, PA-C 05/01/2020, 10:14 AM

## 2020-05-16 NOTE — Patient Instructions (Signed)
DUE TO COVID-19 ONLY ONE VISITOR IS ALLOWED TO COME WITH YOU AND STAY IN THE WAITING ROOM ONLY DURING PRE OP AND PROCEDURE.   IF YOU WILL BE ADMITTED INTO THE HOSPITAL YOU ARE ALLOWED ONE SUPPORT PERSON DURING VISITATION HOURS ONLY (10AM -8PM)    The support person may change daily.  The support person must pass our screening, gel in and out, and wear a mask at all times, including in the patients room.  Patients must also wear a mask when staff or their support person are in the room.   COVID SWAB TESTING MUST BE COMPLETED ON: Friday, Sept. 17, 2021     [address] [place], [place] [address]  (Must self quarantine after testing. Follow instructions on handout.)       Your procedure is scheduled on: Tuesday, Sept. 21, 2021   Report to [facility] Main  Entrance   Report to Short Stay at 5:30 AM   [facility])   Call this number if you have problems the morning of surgery [phone]   Do not eat food :After Midnight.   May have liquids until 4:30 AM   day of surgery  CLEAR LIQUID DIET  Foods Allowed                                                                     Foods Excluded  Water, Black Coffee and tea, regular and decaf                             liquids that you cannot  Plain Jell-O in any flavor  (No red)                                           see through such as: Fruit ices (not with fruit pulp)                                     milk, soups, orange juice              Iced Popsicles (No red)                                    All solid food                                   Apple juices Sports drinks like Gatorade (No red) Lightly seasoned clear broth or consume(fat free) Sugar, honey syrup  Sample Menu Breakfast                                Lunch  Supper Cranberry juice                    Beef broth                            Chicken broth Jell-O                                     Grape juice                            Apple juice Coffee or tea                        Jell-O                                      Popsicle                                                Coffee or tea                        Coffee or tea      Complete one G2 drink the morning of surgery at 4:30 AM the day of surgery.   Oral Hygiene is also important to reduce your risk of infection.                                    Remember - BRUSH YOUR TEETH THE MORNING OF SURGERY WITH YOUR REGULAR TOOTHPASTE   Do NOT smoke after Midnight   Take these medicines the morning of surgery with A SIP OF WATER: Allopurinol, Metoprolol, Prednisone  DO NOT TAKE ANY ORAL DIABETIC MEDICATIONS DAY OF YOUR SURGERY                               You may not have any metal on your body including hair pins, jewelry, and body piercings             Do not wear make-up, lotions, powders, perfumes/cologne, or deodorant             Do not wear nail polish.  Do not shave  48 hours prior to surgery.              Do not bring valuables to the hospital. [place].   Contacts, dentures or bridgework may not be worn into surgery.    Patients discharged the day of surgery will not be allowed to drive home.   Special Instructions: Bring a copy of your healthcare power of attorney and living will documents         the day of surgery if you haven't scanned them in before.              Please read over the following fact sheets you were  given: IF YOU HAVE QUESTIONS ABOUT YOUR PRE OP INSTRUCTIONS PLEASE CALL [phone]   How to Manage Your Diabetes Before and After Surgery  Why is it important to control my blood sugar before and after surgery?  Improving blood sugar levels before and after surgery helps healing and can limit problems.  A way of improving blood sugar control is eating a healthy diet by: o  Eating less sugar and carbohydrates o  Increasing activity/exercise o  Talking with your  doctor about reaching your blood sugar goals  High blood sugars (greater than 180 mg/dL) can raise your risk of infections and slow your recovery, so you will need to focus on controlling your diabetes during the weeks before surgery.  Make sure that the doctor who takes care of your diabetes knows about your planned surgery including the date and location.  How do I manage my blood sugar before surgery?  Check your blood sugar at least 4 times a day, starting 2 days before surgery, to make sure that the level is not too high or low. o Check your blood sugar the morning of your surgery when you wake up and every 2 hours until you get to the Short Stay unit.  If your blood sugar is less than 70 mg/dL, you will need to treat for low blood sugar: o Do not take insulin. o Treat a low blood sugar (less than 70 mg/dL) with  cup of clear juice (cranberry or apple), 4 glucose tablets, OR glucose gel. o Recheck blood sugar in 15 minutes after treatment (to make sure it is greater than 70 mg/dL). If your blood sugar is not greater than 70 mg/dL on recheck, call [phone] for further instructions.  Report your blood sugar to the short stay nurse when you get to Short Stay.   If you are admitted to the hospital after surgery: o Your blood sugar will be checked by the staff and you will probably be given insulin after surgery (instead of oral diabetes medicines) to make sure you have good blood sugar levels. o The goal for blood sugar control after surgery is 80-180 mg/dL.   WHAT DO I DO ABOUT MY DIABETES MEDICATION?   Do not take oral diabetes medicines (pills) the morning of surgery.      THE MORNING OF SURGERY, take  5 units of Solostar insulin.   If your CBG is greater than 220 mg/dL, you may take  of your sliding scale   (correction) dose of insulin.   Reviewed and Endorsed by [facility] Patient Education Committee, August 2015  [facility] - Preparing for Surgery Before  surgery, you can play an important role.  Because skin is not sterile, your skin needs to be as free of germs as possible.  You can reduce the number of germs on your skin by washing with CHG (chlorahexidine gluconate) soap before surgery.  CHG is an antiseptic cleaner which kills germs and bonds with the skin to continue killing germs even after washing. Please DO NOT use if you have an allergy to CHG or antibacterial soaps.  If your skin becomes reddened/irritated stop using the CHG and inform your nurse when you arrive at Short Stay. Do not shave (including legs and underarms) for at least 48 hours prior to the first CHG shower.  You may shave your face/neck.  Please follow these instructions carefully:  1.  Shower with CHG Soap the night before surgery and the  morning of surgery.  2.  If you choose to wash your hair, wash your hair first as usual with your normal  shampoo.  3.  After you shampoo, rinse your hair and body thoroughly to remove the shampoo.                             4.  Use CHG as you would any other liquid soap.  You can apply chg directly to the skin and wash.  Gently with a scrungie or clean washcloth.  5.  Apply the CHG Soap to your body ONLY FROM THE NECK DOWN.   Do   not use on face/ open                           Wound or open sores. Avoid contact with eyes, ears mouth and   genitals (private parts).                       Wash face,  Genitals (private parts) with your normal soap.             6.  Wash thoroughly, paying special attention to the area where your    surgery  will be performed.  7.  Thoroughly rinse your body with warm water from the neck down.  8.  DO NOT shower/wash with your normal soap after using and rinsing off the CHG Soap.                9.  Pat yourself dry with a clean towel.            10.  Wear clean pajamas.            11.  Place clean sheets on your bed the night of your first shower and do not  sleep with pets. Day of Surgery : Do not apply  any lotions/deodorants the morning of surgery.  Please wear clean clothes to the hospital/surgery center.  FAILURE TO FOLLOW THESE INSTRUCTIONS MAY RESULT IN THE CANCELLATION OF YOUR SURGERY  PATIENT SIGNATURE_________________________________  NURSE SIGNATURE__________________________________  ________________________________________________________________________   Adam Phenix  An incentive spirometer is a tool that can help keep your lungs clear and active. This tool measures how well you are filling your lungs with each breath. Taking long deep breaths may help reverse or decrease the chance of developing breathing (pulmonary) problems (especially infection) following:  A long period of time when you are unable to move or be active. BEFORE THE PROCEDURE   If the spirometer includes an indicator to show your best effort, your nurse or respiratory therapist will set it to a desired goal.  If possible, sit up straight or lean slightly forward. Try not to slouch.  Hold the incentive spirometer in an upright position. INSTRUCTIONS FOR USE  1. Sit on the edge of your bed if possible, or sit up as far as you can in bed or on a chair. 2. Hold the incentive spirometer in an upright position. 3. Breathe out normally. 4. Place the mouthpiece in your mouth and seal your lips tightly around it. 5. Breathe in slowly and as deeply as possible, raising the piston or the ball toward the top of the column. 6. Hold your breath for 3-5 seconds or for as long as possible. Allow the piston or ball to fall to the bottom of the column. 7. Remove the mouthpiece from your mouth and  breathe out normally. 8. Rest for a few seconds and repeat Steps 1 through 7 at least 10 times every 1-2 hours when you are awake. Take your time and take a few normal breaths between deep breaths. 9. The spirometer may include an indicator to show your best effort. Use the indicator as a goal to work toward during each  repetition. 10. After each set of 10 deep breaths, practice coughing to be sure your lungs are clear. If you have an incision (the cut made at the time of surgery), support your incision when coughing by placing a pillow or rolled up towels firmly against it. Once you are able to get out of bed, walk around indoors and cough well. You may stop using the incentive spirometer when instructed by your caregiver.  RISKS AND COMPLICATIONS  Take your time so you do not get dizzy or light-headed.  If you are in pain, you may need to take or ask for pain medication before doing incentive spirometry. It is harder to take a deep breath if you are having pain. AFTER USE  Rest and breathe slowly and easily.  It can be helpful to keep track of a log of your progress. Your caregiver can provide you with a simple table to help with this. If you are using the spirometer at home, follow these instructions: [place] IF:   You are having difficultly using the spirometer.  You have trouble using the spirometer as often as instructed.  Your pain medication is not giving enough relief while using the spirometer.  You develop fever of 100.5 F (38.1 C) or higher. SEEK IMMEDIATE MEDICAL CARE IF:   You cough up bloody sputum that had not been present before.  You develop fever of 102 F (38.9 C) or greater.  You develop worsening pain at or near the incision site. MAKE SURE YOU:   Understand these instructions.  Will watch your condition.  Will get help right away if you are not doing well or get worse. Document Released: 01/06/2007 Document Revised: 11/18/2011 Document Reviewed: 03/09/2007 Exit[employer] Patient Information 2014 [employer], [place].   ________________________________________________________________________  WHAT IS A BLOOD TRANSFUSION? Blood Transfusion Information  A transfusion is the replacement of blood or some of its parts. Blood is made up of multiple cells which provide  different functions.  Red blood cells carry oxygen and are used for blood loss replacement.  White blood cells fight against infection.  Platelets control bleeding.  Plasma helps clot blood.  Other blood products are available for specialized needs, such as hemophilia or other clotting disorders. BEFORE THE TRANSFUSION  Who gives blood for transfusions?   Healthy volunteers who are fully evaluated to make sure their blood is safe. This is blood bank blood. Transfusion therapy is the safest it has ever been in the practice of medicine. Before blood is taken from a donor, a complete history is taken to make sure that person has no history of diseases nor engages in risky social behavior (examples are intravenous drug use or sexual activity with multiple partners). The donor's travel history is screened to minimize risk of transmitting infections, such as malaria. The donated blood is tested for signs of infectious diseases, such as HIV and hepatitis. The blood is then tested to be sure it is compatible with you in order to minimize the chance of a transfusion reaction. If you or a relative donates blood, this is often done in anticipation of surgery and is not appropriate for  emergency situations. It takes many days to process the donated blood. RISKS AND COMPLICATIONS Although transfusion therapy is very safe and saves many lives, the main dangers of transfusion include:   Getting an infectious disease.  Developing a transfusion reaction. This is an allergic reaction to something in the blood you were given. Every precaution is taken to prevent this. The decision to have a blood transfusion has been considered carefully by your caregiver before blood is given. Blood is not given unless the benefits outweigh the risks. AFTER THE TRANSFUSION  Right after receiving a blood transfusion, you will usually feel much better and more energetic. This is especially true if your red blood cells have  gotten low (anemic). The transfusion raises the level of the red blood cells which carry oxygen, and this usually causes an energy increase.  The nurse administering the transfusion will monitor you carefully for complications. HOME CARE INSTRUCTIONS  No special instructions are needed after a transfusion. You may find your energy is better. Speak with your caregiver about any limitations on activity for underlying diseases you may have. SEEK MEDICAL CARE IF:   Your condition is not improving after your transfusion.  You develop redness or irritation at the intravenous (IV) site. SEEK IMMEDIATE MEDICAL CARE IF:  Any of the following symptoms occur over the next 12 hours:  Shaking chills.  You have a temperature by mouth above 102 F (38.9 C), not controlled by medicine.  Chest, back, or muscle pain.  People around you feel you are not acting correctly or are confused.  Shortness of breath or difficulty breathing.  Dizziness and fainting.  You get a rash or develop hives.  You have a decrease in urine output.  Your urine turns a dark color or changes to pink, red, or brown. Any of the following symptoms occur over the next 10 days:  You have a temperature by mouth above 102 F (38.9 C), not controlled by medicine.  Shortness of breath.  Weakness after normal activity.  The white part of the eye turns yellow (jaundice).  You have a decrease in the amount of urine or are urinating less often.  Your urine turns a dark color or changes to pink, red, or brown. Document Released: 08/23/2000 Document Revised: 11/18/2011 Document Reviewed: 04/11/2008 [facility] Patient Information 2014 [place], [place].  _______________________________________________________________________

## 2020-05-16 NOTE — Progress Notes (Signed)
COVID Vaccine Completed: Date COVID Vaccine completed: COVID vaccine manufacturer: Pfizer    [employer]   PCP - Dr. Domenick Gong Cardiologist - Dr. Daneen Schick last office visit 11/29/19 in epic, clearance note telephone encounter 04/28/20  Chest x-ray - 07/21/2019 in epic EKG - 07/19/2019 in epic Stress Test -  ECHO - 07/26/2019 in epic Cardiac Cath - greater than 2 years  Sleep Study -  CPAP -   Fasting Blood Sugar -  Checks Blood Sugar _____ times a day  Blood Thinner Instructions: Aspirin Instructions: Last Dose:  Anesthesia review: CAD, history of CABG, CDHF, DM, HTN   Patient denies shortness of breath, fever, cough and chest pain at PAT appointment   Patient verbalized understanding of instructions that were given to them at the PAT appointment. Patient was also instructed that they will need to review over the PAT instructions again at home before surgery.

## 2020-05-24 ENCOUNTER — Other Ambulatory Visit: Payer: Self-pay

## 2020-05-24 ENCOUNTER — Encounter (HOSPITAL_COMMUNITY)
Admission: RE | Admit: 2020-05-24 | Discharge: 2020-05-24 | Disposition: A | Discharge status: Home / Self Care | Payer: Medicare Other | Source: Ambulatory Visit | Attending: Orthopaedic Surgery | Admitting: Orthopaedic Surgery

## 2020-05-24 ENCOUNTER — Encounter (HOSPITAL_COMMUNITY): Payer: Self-pay

## 2020-05-24 DIAGNOSIS — Z9079 Acquired absence of other genital organ(s): Secondary | ICD-10-CM | POA: Insufficient documentation

## 2020-05-24 DIAGNOSIS — I739 Peripheral vascular disease, unspecified: Secondary | ICD-10-CM | POA: Insufficient documentation

## 2020-05-24 DIAGNOSIS — I10 Essential (primary) hypertension: Secondary | ICD-10-CM | POA: Insufficient documentation

## 2020-05-24 DIAGNOSIS — E7800 Pure hypercholesterolemia, unspecified: Secondary | ICD-10-CM | POA: Diagnosis not present

## 2020-05-24 DIAGNOSIS — M1611 Unilateral primary osteoarthritis, right hip: Secondary | ICD-10-CM | POA: Insufficient documentation

## 2020-05-24 DIAGNOSIS — Z7952 Long term (current) use of systemic steroids: Secondary | ICD-10-CM | POA: Insufficient documentation

## 2020-05-24 DIAGNOSIS — I2510 Atherosclerotic heart disease of native coronary artery without angina pectoris: Secondary | ICD-10-CM | POA: Insufficient documentation

## 2020-05-24 DIAGNOSIS — E1140 Type 2 diabetes mellitus with diabetic neuropathy, unspecified: Secondary | ICD-10-CM | POA: Insufficient documentation

## 2020-05-24 DIAGNOSIS — Z79899 Other long term (current) drug therapy: Secondary | ICD-10-CM | POA: Insufficient documentation

## 2020-05-24 DIAGNOSIS — Z794 Long term (current) use of insulin: Secondary | ICD-10-CM | POA: Insufficient documentation

## 2020-05-24 DIAGNOSIS — Z87891 Personal history of nicotine dependence: Secondary | ICD-10-CM | POA: Diagnosis not present

## 2020-05-24 DIAGNOSIS — Z951 Presence of aortocoronary bypass graft: Secondary | ICD-10-CM | POA: Insufficient documentation

## 2020-05-24 DIAGNOSIS — F418 Other specified anxiety disorders: Secondary | ICD-10-CM | POA: Insufficient documentation

## 2020-05-24 DIAGNOSIS — Z7982 Long term (current) use of aspirin: Secondary | ICD-10-CM | POA: Insufficient documentation

## 2020-05-24 DIAGNOSIS — Z01818 Encounter for other preprocedural examination: Secondary | ICD-10-CM | POA: Diagnosis not present

## 2020-05-24 DIAGNOSIS — Z96642 Presence of left artificial hip joint: Secondary | ICD-10-CM | POA: Diagnosis not present

## 2020-05-24 DIAGNOSIS — E119 Type 2 diabetes mellitus without complications: Secondary | ICD-10-CM | POA: Diagnosis not present

## 2020-05-24 HISTORY — DX: Gastro-esophageal reflux disease without esophagitis: K21.9

## 2020-05-24 LAB — URINALYSIS, ROUTINE W REFLEX MICROSCOPIC
Bilirubin Urine: NEGATIVE
Glucose, UA: NEGATIVE mg/dL
Hgb urine dipstick: NEGATIVE
Ketones, ur: NEGATIVE mg/dL
Nitrite: NEGATIVE
Protein, ur: NEGATIVE mg/dL
Specific Gravity, Urine: 1.014 (ref 1.005–1.030)
WBC, UA: >50 WBC/hpf — ABNORMAL HIGH (ref 0–5)
pH: 5 (ref 5.0–8.0)

## 2020-05-24 LAB — CBC WITH DIFFERENTIAL/PLATELET
Abs Immature Granulocytes: 0.2 10*3/uL — ABNORMAL HIGH (ref 0.00–0.07)
Basophils Absolute: 0 10*3/uL (ref 0.0–0.1)
Basophils Relative: 1 %
Eosinophils Absolute: 0 10*3/uL (ref 0.0–0.5)
Eosinophils Relative: 0 %
HCT: 32.1 % — ABNORMAL LOW (ref 36.0–46.0)
Hemoglobin: 10.2 g/dL — ABNORMAL LOW (ref 12.0–15.0)
Immature Granulocytes: 2 %
Lymphocytes Relative: 21 %
Lymphs Abs: 1.8 10*3/uL (ref 0.7–4.0)
MCH: 34 pg (ref 26.0–34.0)
MCHC: 31.8 g/dL (ref 30.0–36.0)
MCV: 107 fL — ABNORMAL HIGH (ref 80.0–100.0)
Monocytes Absolute: 0.5 10*3/uL (ref 0.1–1.0)
Monocytes Relative: 6 %
Neutro Abs: 5.9 10*3/uL (ref 1.7–7.7)
Neutrophils Relative %: 70 %
Platelets: 216 10*3/uL (ref 150–400)
RBC: 3 MIL/uL — ABNORMAL LOW (ref 3.87–5.11)
RDW: 17.1 % — ABNORMAL HIGH (ref 11.5–15.5)
WBC: 8.4 10*3/uL (ref 4.0–10.5)
nRBC: 0.2 % (ref 0.0–0.2)

## 2020-05-24 LAB — BASIC METABOLIC PANEL
Anion gap: 12 (ref 5–15)
BUN: 30 mg/dL — ABNORMAL HIGH (ref 8–23)
CO2: 26 mmol/L (ref 22–32)
Calcium: 9.4 mg/dL (ref 8.9–10.3)
Chloride: 106 mmol/L (ref 98–111)
Creatinine, Ser: 1.26 mg/dL — ABNORMAL HIGH (ref 0.44–1.00)
GFR calc Af Amer: 46 mL/min — ABNORMAL LOW (ref >60)
GFR calc non Af Amer: 39 mL/min — ABNORMAL LOW (ref >60)
Glucose, Bld: 91 mg/dL (ref 70–99)
Potassium: 4.2 mmol/L (ref 3.5–5.1)
Sodium: 144 mmol/L (ref 135–145)

## 2020-05-24 LAB — HEMOGLOBIN A1C
Hgb A1c MFr Bld: 6.5 % — ABNORMAL HIGH (ref 4.8–5.6)
Mean Plasma Glucose: 139.85 mg/dL

## 2020-05-24 LAB — SURGICAL PCR SCREEN
MRSA, PCR: NEGATIVE
Staphylococcus aureus: NEGATIVE

## 2020-05-24 LAB — PROTIME-INR
INR: 1 (ref 0.8–1.2)
Prothrombin Time: 13.1 seconds (ref 11.4–15.2)

## 2020-05-24 LAB — APTT: aPTT: 24 seconds (ref 24–36)

## 2020-05-24 NOTE — H&P (Signed)
TOTAL HIP ADMISSION H&P  Patient is admitted for right total hip arthroplasty.  Subjective:  Chief Complaint: right hip pain  HPI: Patricia Villegas, 85 y.o. female, has a history of pain and functional disability in the right hip(s) due to arthritis and patient has failed non-surgical conservative treatments for greater than 12 weeks to include NSAID's and/or analgesics, corticosteriod injections, flexibility and strengthening excercises, use of assistive devices, weight reduction as appropriate and activity modification.  Onset of symptoms was gradual starting 5 years ago with gradually worsening course since that time.The patient noted no past surgery on the right hip(s).  Patient currently rates pain in the right hip at 10 out of 10 with activity. Patient has night pain, worsening of pain with activity and weight bearing, trendelenberg gait, pain that interfers with activities of daily living and crepitus. Patient has evidence of subchondral cysts, subchondral sclerosis, periarticular osteophytes and joint space narrowing by imaging studies. This condition presents safety issues increasing the risk of falls. There is no current active infection.  Patient Active Problem List   Diagnosis Date Noted  . Carotid bruit 03/21/2016  . Primary osteoarthritis of left hip 01/10/2015  . Chronic diastolic CHF (congestive heart failure) ([place]) 06/30/2013  . CAD (coronary artery disease) of artery bypass graft 05/18/2013  . Femoral artery occlusion, bilateral 05/18/2013  . Diabetes 1.5, managed as type 2 ([place]) 05/18/2013  . Rheumatoid arthritis ([place]) 05/18/2013  . Chest pain 09/18/2011  . Peripheral vascular disease ([place]) 09/18/2011  . Unstable angina ([place]) 08/23/2011  . Hypertension 08/23/2011  . Hyperlipidemia 08/23/2011   Past Medical History:  Diagnosis Date  . Anemia   . Anxiety   . Arthritis   . Blood transfusion   . Chest pain    a. Lexiscan Myoview 12/12:  EF 75%, no ischemia or scar;  b.   Echo 12/12:  EF 60-65%, no sig valvular abnormalities   . Closed left fibular fracture    Casted   . Complication of anesthesia 1998   Unable to arouse fully, respiratory depression, pt. reports that the family was told that she would need a trach., but then she stabilized & didn't need it   . Coronary artery disease    sees Dr Aundra Dubin every 6 months  . Depression   . Diabetes mellitus    Greater than 20 years type 2   . GERD (gastroesophageal reflux disease)   . H/O hiatal hernia   . Hypercholesteremia   . Hypertension    Greater than 20 years  . Lumbar disc disease    S/p surgery x4;  Sciatica   . Neuropathy due to secondary diabetes ([place])    Hx: of  . Parastomal hernia of ileal conduit ([place]) 2009  . Pneumonia    s/p CABG  . [name] (peripheral vascular disease) ([facility])    Stable claudication. Arteriogram 07/2009 no significant aortoiliac disease, has bilateral SFA occlusions with distal reconstitution   . Rheumatoid arthritis ([place])    Dr Berna Bue @ [facility]  . Transfusion reaction 1960   Convulsions (? seizure)  . UTI (lower urinary tract infection) 01/03/15    Past Surgical History:  Procedure Laterality Date  . ABDOMINAL HYSTERECTOMY    . BACK SURGERY     4 back operations most recently 1995, 1998  . CARDIAC CATHETERIZATION     Hx: of 05/14/13  . CATARACT EXTRACTION W/ INTRAOCULAR LENS  IMPLANT, BILATERAL     Hx: of  . COLONOSCOPY  Hx; of  . CORONARY ARTERY BYPASS GRAFT N/A 05/24/2013   Procedure: CORONARY ARTERY BYPASS GRAFTING (CABG);  Surgeon: Melrose Nakayama, MD;  Location: [place];  Service: Open Heart Surgery;  Laterality: N/A;  . DILATION AND CURETTAGE OF UTERUS    . HERNIA REPAIR  2009  . LEFT HEART CATHETERIZATION WITH CORONARY ANGIOGRAM N/A 05/14/2013   Procedure: LEFT HEART CATHETERIZATION WITH CORONARY ANGIOGRAM;  Surgeon: Larey Dresser, MD;  Location[facility] LAB;  Service: Cardiovascular;  Laterality: N/A;  . TONSILLECTOMY    . TOTAL HIP  ARTHROPLASTY Left 01/10/2015   Procedure: TOTAL HIP ARTHROPLASTY ANTERIOR APPROACH;  Surgeon: Melrose Nakayama, MD;  Location: [place];  Service: Orthopedics;  Laterality: Left;    No current facility-administered medications for this encounter.   Current Outpatient Medications  Medication Sig Dispense Refill Last Dose  . allopurinol (ZYLOPRIM) 100 MG tablet Take 200 mg by mouth daily.      Marland Kitchen aspirin EC 81 MG tablet Take 1 tablet (81 mg total) by mouth daily.     . benazepril (LOTENSIN) 20 MG tablet Take 20 mg by mouth daily.     . folic acid (FOLVITE) 1 MG tablet Take 2 mg by mouth daily.     . furosemide (LASIX) 20 MG tablet Take 20 mg by mouth daily.      Marland Kitchen HYDROcodone-acetaminophen (NORCO/VICODIN) 5-325 MG tablet Take 1 tablet by mouth 2 (two) times daily as needed for moderate pain or severe pain.      . hydroxychloroquine (PLAQUENIL) 200 MG tablet Take 200 mg by mouth 2 (two) times daily.     Marland Kitchen LANTUS SOLOSTAR 100 UNIT/ML Solostar Pen Inject 10 Units into the skin daily.     . methotrexate 2.5 MG tablet Take 25 mg by mouth once a week. Sunday     . metoprolol tartrate (LOPRESSOR) 25 MG tablet Take 0.5 tablets (12.5 mg total) by mouth 2 (two) times daily. 90 tablet 3   . predniSONE (DELTASONE) 5 MG tablet Take 5 mg by mouth daily with breakfast.      . senna-docusate (SENOKOT-S) 8.6-50 MG tablet Take 1 tablet by mouth daily as needed for mild constipation.     . simvastatin (ZOCOR) 80 MG tablet Take 80 mg by mouth at bedtime.       Marland Kitchen spironolactone (ALDACTONE) 25 MG tablet Take 0.5 tablets (12.5 mg total) by mouth daily. 45 tablet 3   . ONE TOUCH ULTRA TEST test strip       No Known Allergies  Social History   Tobacco Use  . Smoking status: Former Smoker    Packs/day: 0.50    Years: 10.00    Pack years: 5.00    Types: Cigarettes    Quit date: 09/09/1996    Years since quitting: 23.7  . Smokeless tobacco: Current User    Types: Chew  Substance Use Topics  . Alcohol use: No     Family History  Problem Relation Age of Onset  . Coronary artery disease Mother 45  . Diabetes Mother   . Stroke Mother   . Heart attack Mother   . Cancer Brother   . Heart disease Brother   . Stroke Brother   . Kidney disease Brother   . Cancer Sister   . Diabetes Brother      Review of Systems  Musculoskeletal: Positive for arthralgias.       Right hip  All other systems reviewed and are negative.   Objective:  Physical Exam Constitutional:      Appearance: Normal appearance.  HENT:     Head: Normocephalic and atraumatic.     Nose: Nose normal.     Mouth/Throat:     Pharynx: Oropharynx is clear.  Eyes:     Extraocular Movements: Extraocular movements intact.  Cardiovascular:     Rate and Rhythm: Normal rate and regular rhythm.  Pulmonary:     Effort: Pulmonary effort is normal.  Abdominal:     Palpations: Abdomen is soft.  Musculoskeletal:     Comments: : Right hip motion is extremely painful.  Opposite hip moves well.  She walks with a very altered gait which is unstable.  Sensation and motor function are intact in her feet with palpable pulses on both sides.    Skin:    General: Skin is warm and dry.  Neurological:     General: No focal deficit present.     Mental Status: She is alert and oriented to person, place, and time. Mental status is at baseline.  Psychiatric:        Mood and Affect: Mood normal.        Behavior: Behavior normal.     Vital signs in last 24 hours:    Labs:   Estimated body mass index is 23.73 kg/m as calculated from the following:   Height as of 11/29/19: 5\' 6"  (1.676 m).   Weight as of 11/29/19: 66.7 kg.   Imaging Review Plain radiographs demonstrate severe degenerative joint disease of the right hip(s). The bone quality appears to be good for age and reported activity level.      Assessment/Plan:  End stage primary arthritis, right hip(s)  The patient history, physical examination, clinical judgement of the  provider and imaging studies are consistent with end stage degenerative joint disease of the right hip(s) and total hip arthroplasty is deemed medically necessary. The treatment options including medical management, injection therapy, arthroscopy and arthroplasty were discussed at length. The risks and benefits of total hip arthroplasty were presented and reviewed. The risks due to aseptic loosening, infection, stiffness, dislocation/subluxation,  thromboembolic complications and other imponderables were discussed.  The patient acknowledged the explanation, agreed to proceed with the plan and consent was signed. Patient is being admitted for inpatient treatment for surgery, pain control, PT, OT, prophylactic antibiotics, VTE prophylaxis, progressive ambulation and ADL's and discharge planning.The patient is planning to be discharged home with home health services

## 2020-05-24 NOTE — Progress Notes (Addendum)
,  cbc, U/A, and BMP  done 05/24/2020 faxed via epic to Dr Rhona Raider.

## 2020-05-24 NOTE — Progress Notes (Signed)
DUE TO COVID-19 ONLY ONE VISITOR IS ALLOWED TO COME WITH YOU AND STAY IN THE WAITING ROOM ONLY DURING PRE OP AND PROCEDURE DAY OF SURGERY. THE 1 VISITOR  MAY VISIT WITH YOU AFTER SURGERY IN YOUR PRIVATE ROOM DURING VISITING HOURS ONLY!  YOU NEED TO HAVE A COVID 19 TEST ON__9/17/2021 _____ @_______ , THIS TEST MUST BE DONE BEFORE SURGERY,  COVID TESTING SITE [address] [place] [address], IT IS ON THE RIGHT GOING OUT [address] APPROXIMATELY  2 MINUTES PAST [employer] ON THE RIGHT. ONCE YOUR COVID TEST IS COMPLETED,  PLEASE BEGIN THE QUARANTINE INSTRUCTIONS AS OUTLINED IN YOUR HANDOUT.                Patricia Villegas  05/24/2020   Your procedure is scheduled on: 05/30/20    Report to [facility] Main  Entrance   Report to admitting at     0530 AM     Call this number if you have problems the morning of surgery [phone]    REMEMBER: NO  SOLID FOOD CANDY OR GUM AFTER MIDNIGHT. CLEAR LIQUIDS UNTIL 0430am        . NOTHING BY MOUTH EXCEPT CLEAR LIQUIDS UNTIL    . PLEASE FINISH ENSURE DRINK PER SURGEON ORDER  WHICH NEEDS TO BE COMPLETED AT  0430am  .      CLEAR LIQUID DIET   Foods Allowed                                                                    Coffee and tea, regular and decaf                            Fruit ices (not with fruit pulp)                                      Iced Popsicles                                    Carbonated beverages, regular and diet                                    Cranberry, grape and apple juices Sports drinks like Gatorade Lightly seasoned clear broth or consume(fat free) Sugar, honey syrup ___________________________________________________________________      BRUSH YOUR TEETH MORNING OF SURGERY AND RINSE YOUR MOUTH OUT, NO CHEWING GUM CANDY OR MINTS.     Take these medicines the morning of surgery with A SIP OF WATER:  Allopurinol, Plaquenil, metoprolol  Take lantus  DO NOT TAKE ANY DIABETIC  MEDICATIONS DAY OF YOUR SURGERY                               You may not have any metal on your body including hair pins and              piercings  Do not wear jewelry, make-up, lotions, powders or perfumes, deodorant             Do not wear nail polish on your fingernails.  Do not shave  48 hours prior to surgery.              Men may shave face and neck.   Do not bring valuables to the hospital. [place].  Contacts, dentures or bridgework may not be worn into surgery.  Leave suitcase in the car. After surgery it may be brought to your room.     Patients discharged the day of surgery will not be allowed to drive home. IF YOU ARE HAVING SURGERY AND GOING HOME THE SAME DAY, YOU MUST HAVE AN ADULT TO DRIVE YOU HOME AND BE WITH YOU FOR 24 HOURS. YOU MAY GO HOME BY TAXI OR UBER OR ORTHERWISE, BUT AN ADULT MUST ACCOMPANY YOU HOME AND STAY WITH YOU FOR 24 HOURS.  Name and phone number of your driver:  Special Instructions: N/A              Please read over the following fact sheets you were given: _____________________________________________________________________  [facility] - Preparing for Surgery Before surgery, you can play an important role.  Because skin is not sterile, your skin needs to be as free of germs as possible.  You can reduce the number of germs on your skin by washing with CHG (chlorahexidine gluconate) soap before surgery.  CHG is an antiseptic cleaner which kills germs and bonds with the skin to continue killing germs even after washing. Please DO NOT use if you have an allergy to CHG or antibacterial soaps.  If your skin becomes reddened/irritated stop using the CHG and inform your nurse when you arrive at Short Stay. Do not shave (including legs and underarms) for at least 48 hours prior to the first CHG shower.  You may shave your face/neck. Please follow these instructions carefully:  1.  Shower with CHG Soap the night  before surgery and the  morning of Surgery.  2.  If you choose to wash your hair, wash your hair first as usual with your  normal  shampoo.  3.  After you shampoo, rinse your hair and body thoroughly to remove the  shampoo.                           4.  Use CHG as you would any other liquid soap.  You can apply chg directly  to the skin and wash                       Gently with a scrungie or clean washcloth.  5.  Apply the CHG Soap to your body ONLY FROM THE NECK DOWN.   Do not use on face/ open                           Wound or open sores. Avoid contact with eyes, ears mouth and genitals (private parts).                       Wash face,  Genitals (private parts) with your normal soap.             6.  Wash  thoroughly, paying special attention to the area where your surgery  will be performed.  7.  Thoroughly rinse your body with warm water from the neck down.  8.  DO NOT shower/wash with your normal soap after using and rinsing off  the CHG Soap.                9.  Pat yourself dry with a clean towel.            10.  Wear clean pajamas.            11.  Place clean sheets on your bed the night of your first shower and do not  sleep with pets. Day of Surgery : Do not apply any lotions/deodorants the morning of surgery.  Please wear clean clothes to the hospital/surgery center.  FAILURE TO FOLLOW THESE INSTRUCTIONS MAY RESULT IN THE CANCELLATION OF YOUR SURGERY PATIENT SIGNATURE_________________________________  NURSE SIGNATURE__________________________________  ________________________________________________________________________

## 2020-05-24 NOTE — Progress Notes (Addendum)
Anesthesia Review:  PCP: Tisovec  Cardiologist : Daneen Schick  Clearance in telephone note 04/28/20 LOV- 11/29/19  Chest x-ray :07/21/19-epic  EKG :07/19/2019 Echo :07/26/2019 Stress test: Cardiac Cath :  Activity level: cannot do a flight of stairs without difficulty related to hip  Sleep Study/ CPAP : no  Fasting Blood Sugar :      / Checks Blood Sugar -- times a day:   Blood Thinner/ Instructions /Last Dose: ASA / Instructions/ Last Dose :  ASA 81 mg - pt states given no instructions  LVMM for Rebe[place] ( surgery Scheduler dr Rhona Raider) that patient has not received any preop instructions regarding Aspirin.   DM- type 2  HGBA1C 6.5- 05/24/2020  CBC, BMP and U/A donew 05/24/2020 faxed to Dr Rhona Raider.

## 2020-05-24 NOTE — Progress Notes (Addendum)
CBG at time of preop on 05/24/2020 was 83 at 1230pm approx.  Patient given water and graham crackers and peanut butter.  Patient was asymptomatic.   Patient voices no complaints. Patient states she has about 30 minute drive from home.  Patient ate part of peanut butter and graham crackers prior to being discharged home with son and husband.  Konrad Felix, PAC made aware at time of preop appt of above. No new orders given.

## 2020-05-25 LAB — GLUCOSE, CAPILLARY: Glucose-Capillary: 83 mg/dL (ref 70–99)

## 2020-05-25 NOTE — Progress Notes (Signed)
Anesthesia Chart Review   Case: [id number] Date/Time: 05/30/20 0715   Procedure: RIGHT TOTAL HIP ARTHROPLASTY ANTERIOR APPROACH (Right Hip)   Anesthesia type: Spinal   Pre-op diagnosis: RIGHT HIP DEGENERATIVE JOINT DISEASE   Location: [facility] ROOM 08 / [facility]   Surgeons: Melrose Nakayama, MD      DISCUSSION:85 y.o. former smoker (5 pack years, quit 09/09/96) with h/o HTN, CAD (CABG), GERD, DM II, right hip djd scheduled for above procedure 05/30/2020 with Dr. Melrose Nakayama.   Per cardiology preoperative risk assessment 05/01/2020, "Chart reviewed and patient contacted by phone today as part of pre-operative protocol coverage. Given past medical history and time since last visit, based on ACC/AHA guidelines, ATZIRY BARANSKI would be at acceptable risk for the planned procedure without further cardiovascular testing.  In general we would prefer the patient remain on aspirin during the perioperative period but OK to hold 5-7 days pre op if needed."  Anticipate pt can proceed with planned procedure barring acute status change.   VS: BP (!) 114/53   Pulse (!) 54   Temp 36.8 C (Oral)   Ht 5\' 6"  (1.676 m)   SpO2 98%   BMI 23.73 kg/m   PROVIDERS: T[facility]ovec, Fransico Him, MD is PCP   Daneen Schick, MD is Cardiologist  LABS: Forwarded to surgeon (all labs ordered are listed, but only abnormal results are displayed)  Labs Reviewed  HEMOGLOBIN A1C - Abnormal; Notable for the following components:      Result Value   Hgb A1c MFr Bld 6.5 (*)    All other components within normal limits  BASIC METABOLIC PANEL - Abnormal; Notable for the following components:   BUN 30 (*)    Creatinine, Ser 1.26 (*)    GFR calc non Af Amer 39 (*)    GFR calc Af Amer 46 (*)    All other components within normal limits  CBC WITH DIFFERENTIAL/PLATELET - Abnormal; Notable for the following components:   RBC 3.00 (*)    Hemoglobin 10.2 (*)    HCT 32.1 (*)    MCV 107.0 (*)    RDW 17.1 (*)    Abs Immature Granulocytes 0.20  (*)    All other components within normal limits  URINALYSIS, ROUTINE W REFLEX MICROSCOPIC - Abnormal; Notable for the following components:   APPearance HAZY (*)    Leukocytes,Ua LARGE (*)    WBC, UA >50 (*)    Bacteria, UA RARE (*)    All other components within normal limits  SURGICAL PCR SCREEN  APTT  PROTIME-INR  GLUCOSE, CAPILLARY  TYPE AND SCREEN     IMAGES:   EKG: 07/29/2019 Rate 61 bpm  NSR Left axis deviation  Septal infarct, age undetermined   CV: Echo 07/26/2019 IMPRESSIONS    1. Left ventricular ejection fraction, by visual estimation, is 60 to  65%. The left ventricle has normal function. There is no left ventricular  hypertrophy.  2. Elevated left ventricular end-diastolic pressure.  3. Left ventricular diastolic parameters are consistent with Grade II  diastolic dysfunction (pseudonormalization).  4. Global right ventricle has normal systolic function.The right  ventricular size is normal. No increase in right ventricular wall  thickness.  5. Left atrial size was mildly dilated.  6. Right atrial size was normal.  7. The mitral valve is normal in structure. Mild mitral valve  regurgitation. No evidence of mitral stenosis.  8. The tricuspid valve is normal in structure. Tricuspid valve  regurgitation is mild.  9. The aortic  valve is tricuspid. Aortic valve regurgitation is trivial.  Mild to moderate aortic valve sclerosis/calcification without any evidence  of aortic stenosis.  10. The pulmonic valve was normal in structure. Pulmonic valve  regurgitation is not visualized.  11. There is mild dilatation of the ascending aorta measuring 42 mm.  12. Moderately elevated pulmonary artery systolic pressure at 65LDJT.  13. The inferior vena cava is normal in size with greater than 50%  respiratory variability, suggesting right atrial pressure of 3 mmHg.  14. The average left ventricular global longitudinal strain is -20.7 %.   Myocardial  Perfusion 08/25/2011 IMPRESSION:   1. No evidence for pharmacologically induced myocardial ischemia.  2. Normal left ventricular systolic function.  3. Left ventricular ejection fraction equals 75%.  Past Medical History:  Diagnosis Date  . Anemia   . Anxiety   . Arthritis   . Blood transfusion   . Chest pain    a. Lexiscan Myoview 12/12:  EF 75%, no ischemia or scar;  b.  Echo 12/12:  EF 60-65%, no sig valvular abnormalities   . Closed left fibular fracture    Casted   . Complication of anesthesia 1998   Unable to arouse fully, respiratory depression, pt. reports that the family was told that she would need a trach., but then she stabilized & didn't need it   . Coronary artery disease    sees Dr Aundra Dubin every 6 months  . Depression   . Diabetes mellitus    Greater than 20 years type 2   . GERD (gastroesophageal reflux disease)   . H/O hiatal hernia   . Hypercholesteremia   . Hypertension    Greater than 20 years  . Lumbar disc disease    S/p surgery x4;  Sciatica   . Neuropathy due to secondary diabetes ([place])    Hx: of  . Parastomal hernia of ileal conduit ([place]) 2009  . Pneumonia    s/p CABG  . [name] (peripheral vascular disease) ([facility])    Stable claudication. Arteriogram 07/2009 no significant aortoiliac disease, has bilateral SFA occlusions with distal reconstitution   . Rheumatoid arthritis ([place])    Dr Berna Bue @ [facility]  . Transfusion reaction 1960   Convulsions (? seizure)  . UTI (lower urinary tract infection) 01/03/15    Past Surgical History:  Procedure Laterality Date  . ABDOMINAL HYSTERECTOMY    . BACK SURGERY     4 back operations most recently 1995, 1998  . CARDIAC CATHETERIZATION     Hx: of 05/14/13  . CATARACT EXTRACTION W/ INTRAOCULAR LENS  IMPLANT, BILATERAL     Hx: of  . COLONOSCOPY     Hx; of  . CORONARY ARTERY BYPASS GRAFT N/A 05/24/2013   Procedure: CORONARY ARTERY BYPASS GRAFTING (CABG);  Surgeon: Melrose Nakayama, MD;   Location: [place];  Service: Open Heart Surgery;  Laterality: N/A;  . DILATION AND CURETTAGE OF UTERUS    . HERNIA REPAIR  2009  . LEFT HEART CATHETERIZATION WITH CORONARY ANGIOGRAM N/A 05/14/2013   Procedure: LEFT HEART CATHETERIZATION WITH CORONARY ANGIOGRAM;  Surgeon: Larey Dresser, MD;  Location: [facility] CATH LAB;  Service: Cardiovascular;  Laterality: N/A;  . TONSILLECTOMY    . TOTAL HIP ARTHROPLASTY Left 01/10/2015   Procedure: TOTAL HIP ARTHROPLASTY ANTERIOR APPROACH;  Surgeon: Melrose Nakayama, MD;  Location: [place];  Service: Orthopedics;  Laterality: Left;    MEDICATIONS: . allopurinol (ZYLOPRIM) 100 MG tablet  . aspirin EC 81 MG tablet  . benazepril (  LOTENSIN) 20 MG tablet  . folic acid (FOLVITE) 1 MG tablet  . furosemide (LASIX) 20 MG tablet  . HYDROcodone-acetaminophen (NORCO/VICODIN) 5-325 MG tablet  . hydroxychloroquine (PLAQUENIL) 200 MG tablet  . LANTUS SOLOSTAR 100 UNIT/ML Solostar Pen  . methotrexate 2.5 MG tablet  . metoprolol tartrate (LOPRESSOR) 25 MG tablet  . ONE TOUCH ULTRA TEST test strip  . predniSONE (DELTASONE) 5 MG tablet  . senna-docusate (SENOKOT-S) 8.6-50 MG tablet  . simvastatin (ZOCOR) 80 MG tablet  . spironolactone (ALDACTONE) 25 MG tablet   No current facility-administered medications for this encounter.    Konrad Felix, PA-C [facility] Pre-Surgical Testing [phone]

## 2020-05-26 ENCOUNTER — Other Ambulatory Visit (HOSPITAL_COMMUNITY)
Admission: RE | Admit: 2020-05-26 | Discharge: 2020-05-26 | Disposition: A | Discharge status: Home / Self Care | Payer: Medicare Other | Source: Ambulatory Visit | Attending: Orthopaedic Surgery | Admitting: Orthopaedic Surgery

## 2020-05-26 DIAGNOSIS — Z20822 Contact with and (suspected) exposure to covid-19: Secondary | ICD-10-CM | POA: Insufficient documentation

## 2020-05-26 DIAGNOSIS — Z01812 Encounter for preprocedural laboratory examination: Secondary | ICD-10-CM | POA: Insufficient documentation

## 2020-05-26 LAB — SARS CORONAVIRUS 2 (TAT 6-24 HRS): SARS Coronavirus 2: NEGATIVE

## 2020-05-29 ENCOUNTER — Encounter (HOSPITAL_COMMUNITY): Payer: Self-pay | Admitting: Orthopaedic Surgery

## 2020-05-29 MED ORDER — BUPIVACAINE LIPOSOME 1.3 % IJ SUSP
10.0000 mL | INTRAMUSCULAR | Status: AC
  Filled 2020-05-29: qty 10

## 2020-05-29 MED ORDER — TRANEXAMIC ACID 1000 MG/10ML IV SOLN
2000.0000 mg | INTRAVENOUS | Status: AC
  Filled 2020-05-29: qty 20

## 2020-05-29 NOTE — Anesthesia Preprocedure Evaluation (Addendum)
Anesthesia Evaluation  Patient identified by MRN, date of birth, ID band Patient awake    Reviewed: Allergy & Precautions, NPO status , Patient's Chart, lab work & pertinent test results  History of Anesthesia Complications Negative for: history of anesthetic complications  Airway Mallampati: II  TM Distance: >3 FB Neck ROM: Full    Dental  (+) Dental Advisory Given   Pulmonary shortness of breath, former smoker,    Pulmonary exam normal breath sounds clear to auscultation       Cardiovascular hypertension, Pt. on medications and Pt. on home beta blockers + angina + CAD, + Peripheral Vascular Disease and +CHF  Normal cardiovascular exam Rhythm:Regular Rate:Normal  Echo 07/2019 1. Left ventricular ejection fraction, by visual estimation, is 60 to 65%. The left ventricle has normal function. There is no left ventricular hypertrophy.  2. Elevated left ventricular end-diastolic pressure.  3. Left ventricular diastolic parameters are consistent with Grade II diastolic dysfunction (pseudonormalization).  4. Global right ventricle has normal systolic function.The right ventricular size is normal. No increase in right ventricular wall thickness.  5. Left atrial size was mildly dilated.  6. Right atrial size was normal.  7. The mitral valve is normal in structure. Mild mitral valve  regurgitation. No evidence of mitral stenosis.  8. The tricuspid valve is normal in structure. Tricuspid valve regurgitation is mild.  9. The aortic valve is tricuspid. Aortic valve regurgitation is trivial. Mild to moderate aortic valve sclerosis/calcification without any evidence of aortic stenosis.  10. The pulmonic valve was normal in structure. Pulmonic valve regurgitation is not visualized.  11. There is mild dilatation of the ascending aorta measuring 42 mm.  12. Moderately elevated pulmonary artery systolic pressure at 70BEML.  13. The inferior  vena cava is normal in size with greater than 50% respiratory variability, suggesting right atrial pressure of 3 mmHg.  14. The average left ventricular global longitudinal strain is -20.7 %.    Neuro/Psych PSYCHIATRIC DISORDERS Anxiety Depression negative neurological ROS     GI/Hepatic Neg liver ROS, hiatal hernia, GERD  ,  Endo/Other  diabetes, Type 2  Renal/GU negative Renal ROS     Musculoskeletal  (+) Arthritis ,   Abdominal   Peds  Hematology  (+) Blood dyscrasia, anemia ,   Anesthesia Other Findings   Reproductive/Obstetrics                           Anesthesia Physical  Anesthesia Plan  ASA: III  Anesthesia Plan: MAC and Spinal   Post-op Pain Management:    Induction: Intravenous  PONV Risk Score and Plan: 3 and Treatment may vary due to age or medical condition, Propofol infusion, TIVA and Ondansetron  Airway Management Planned: Natural Airway  Additional Equipment: None  Intra-op Plan:   Post-operative Plan:   Informed Consent: I have reviewed the patients History and Physical, chart, labs and discussed the procedure including the risks, benefits and alternatives for the proposed anesthesia with the patient or authorized representative who has indicated his/her understanding and acceptance.     Dental advisory given  Plan Discussed with: CRNA  Anesthesia Plan Comments:       Anesthesia Quick Evaluation

## 2020-05-30 ENCOUNTER — Other Ambulatory Visit: Payer: Self-pay

## 2020-05-30 ENCOUNTER — Encounter (HOSPITAL_COMMUNITY)
Admission: RE | Disposition: A | Discharge status: Home / Self Care | Payer: Self-pay | Source: Home / Self Care | Attending: Orthopaedic Surgery

## 2020-05-30 ENCOUNTER — Ambulatory Visit (HOSPITAL_COMMUNITY): Payer: Medicare Other | Admitting: Anesthesiology

## 2020-05-30 ENCOUNTER — Ambulatory Visit (HOSPITAL_COMMUNITY): Payer: Medicare Other

## 2020-05-30 ENCOUNTER — Encounter (HOSPITAL_COMMUNITY): Payer: Self-pay | Admitting: Orthopaedic Surgery

## 2020-05-30 ENCOUNTER — Ambulatory Visit (HOSPITAL_COMMUNITY): Payer: Medicare Other | Admitting: Physician Assistant

## 2020-05-30 ENCOUNTER — Observation Stay (HOSPITAL_COMMUNITY)
Admission: RE | Admit: 2020-05-30 | Discharge: 2020-05-31 | Disposition: A | Discharge status: Home / Self Care | Payer: Medicare Other | Attending: Orthopaedic Surgery | Admitting: Orthopaedic Surgery

## 2020-05-30 DIAGNOSIS — E119 Type 2 diabetes mellitus without complications: Secondary | ICD-10-CM | POA: Insufficient documentation

## 2020-05-30 DIAGNOSIS — M25551 Pain in right hip: Secondary | ICD-10-CM | POA: Diagnosis present

## 2020-05-30 DIAGNOSIS — Z7982 Long term (current) use of aspirin: Secondary | ICD-10-CM | POA: Insufficient documentation

## 2020-05-30 DIAGNOSIS — Z96642 Presence of left artificial hip joint: Secondary | ICD-10-CM | POA: Diagnosis not present

## 2020-05-30 DIAGNOSIS — Z951 Presence of aortocoronary bypass graft: Secondary | ICD-10-CM | POA: Diagnosis not present

## 2020-05-30 DIAGNOSIS — I10 Essential (primary) hypertension: Secondary | ICD-10-CM | POA: Insufficient documentation

## 2020-05-30 DIAGNOSIS — I739 Peripheral vascular disease, unspecified: Secondary | ICD-10-CM

## 2020-05-30 DIAGNOSIS — M1611 Unilateral primary osteoarthritis, right hip: Principal | ICD-10-CM | POA: Diagnosis present

## 2020-05-30 DIAGNOSIS — I2510 Atherosclerotic heart disease of native coronary artery without angina pectoris: Secondary | ICD-10-CM | POA: Insufficient documentation

## 2020-05-30 DIAGNOSIS — Z79899 Other long term (current) drug therapy: Secondary | ICD-10-CM | POA: Diagnosis not present

## 2020-05-30 DIAGNOSIS — Z87891 Personal history of nicotine dependence: Secondary | ICD-10-CM | POA: Diagnosis not present

## 2020-05-30 DIAGNOSIS — Z96649 Presence of unspecified artificial hip joint: Secondary | ICD-10-CM

## 2020-05-30 DIAGNOSIS — Z419 Encounter for procedure for purposes other than remedying health state, unspecified: Secondary | ICD-10-CM

## 2020-05-30 HISTORY — PX: TOTAL HIP ARTHROPLASTY: SHX124

## 2020-05-30 LAB — GLUCOSE, CAPILLARY
Glucose-Capillary: 79 mg/dL (ref 70–99)
Glucose-Capillary: 128 mg/dL — ABNORMAL HIGH (ref 70–99)
Glucose-Capillary: 143 mg/dL — ABNORMAL HIGH (ref 70–99)

## 2020-05-30 LAB — TYPE AND SCREEN
ABO/RH(D): A POS
Antibody Screen: NEGATIVE

## 2020-05-30 SURGERY — ARTHROPLASTY, HIP, TOTAL, ANTERIOR APPROACH
Anesthesia: Monitor Anesthesia Care | Site: Hip | Laterality: Right

## 2020-05-30 MED ORDER — HYDROCODONE-ACETAMINOPHEN 5-325 MG PO TABS
ORAL_TABLET | ORAL | Status: AC
  Filled 2020-05-30: qty 1

## 2020-05-30 MED ORDER — LACTATED RINGERS IV SOLN: INTRAVENOUS | Status: DC

## 2020-05-30 MED ORDER — LIDOCAINE 2% (20 MG/ML) 5 ML SYRINGE
INTRAMUSCULAR | Status: DC | PRN
  Administered 2020-05-30: 40 mg via INTRAVENOUS

## 2020-05-30 MED ORDER — INSULIN GLARGINE 100 UNIT/ML ~~LOC~~ SOLN
10.0000 [IU] | SUBCUTANEOUS | Status: DC
  Filled 2020-05-30: qty 0.1

## 2020-05-30 MED ORDER — ACETAMINOPHEN 325 MG PO TABS: 325.0000 mg | ORAL_TABLET | Freq: Four times a day (QID) | ORAL | Status: DC | PRN

## 2020-05-30 MED ORDER — KETOROLAC TROMETHAMINE 15 MG/ML IJ SOLN
INTRAMUSCULAR | Status: AC
  Filled 2020-05-30: qty 1

## 2020-05-30 MED ORDER — METHOCARBAMOL 500 MG IVPB - SIMPLE MED
INTRAVENOUS | Status: AC
  Filled 2020-05-30: qty 50

## 2020-05-30 MED ORDER — BUPIVACAINE IN DEXTROSE 0.75-8.25 % IT SOLN
INTRATHECAL | Status: DC | PRN
  Administered 2020-05-30: 1.6 mL via INTRATHECAL

## 2020-05-30 MED ORDER — METOCLOPRAMIDE HCL 5 MG/ML IJ SOLN: 5.0000 mg | Freq: Three times a day (TID) | INTRAMUSCULAR | Status: DC | PRN

## 2020-05-30 MED ORDER — MORPHINE SULFATE (PF) 4 MG/ML IV SOLN
INTRAVENOUS | Status: AC
  Filled 2020-05-30: qty 1

## 2020-05-30 MED ORDER — CEFAZOLIN SODIUM-DEXTROSE 2-4 GM/100ML-% IV SOLN
2.0000 g | INTRAVENOUS | Status: AC
  Administered 2020-05-30: 2 g via INTRAVENOUS
  Filled 2020-05-30: qty 100

## 2020-05-30 MED ORDER — FENTANYL CITRATE (PF) 100 MCG/2ML IJ SOLN
INTRAMUSCULAR | Status: AC
  Filled 2020-05-30: qty 2

## 2020-05-30 MED ORDER — DEXAMETHASONE SODIUM PHOSPHATE 10 MG/ML IJ SOLN
INTRAMUSCULAR | Status: DC | PRN
  Administered 2020-05-30: 4 mg via INTRAVENOUS

## 2020-05-30 MED ORDER — ACETAMINOPHEN 500 MG PO TABS
ORAL_TABLET | ORAL | Status: AC
  Filled 2020-05-30: qty 1

## 2020-05-30 MED ORDER — ONDANSETRON HCL 4 MG/2ML IJ SOLN: 4.0000 mg | Freq: Once | INTRAMUSCULAR | Status: DC | PRN

## 2020-05-30 MED ORDER — ONDANSETRON HCL 4 MG/2ML IJ SOLN: 4.0000 mg | Freq: Four times a day (QID) | INTRAMUSCULAR | Status: DC | PRN

## 2020-05-30 MED ORDER — CEFAZOLIN SODIUM-DEXTROSE 2-4 GM/100ML-% IV SOLN
INTRAVENOUS | Status: AC
  Filled 2020-05-30: qty 100

## 2020-05-30 MED ORDER — FUROSEMIDE 20 MG PO TABS
20.0000 mg | ORAL_TABLET | Freq: Every day | ORAL | Status: DC
  Administered 2020-05-31: 20 mg via ORAL
  Filled 2020-05-30: qty 1

## 2020-05-30 MED ORDER — MENTHOL 3 MG MT LOZG: 1.0000 | LOZENGE | OROMUCOSAL | Status: DC | PRN

## 2020-05-30 MED ORDER — TRANEXAMIC ACID 1000 MG/10ML IV SOLN
INTRAVENOUS | Status: DC | PRN
  Administered 2020-05-30: 2000 mg via TOPICAL

## 2020-05-30 MED ORDER — DEXAMETHASONE SODIUM PHOSPHATE 10 MG/ML IJ SOLN
INTRAMUSCULAR | Status: AC
  Filled 2020-05-30: qty 3

## 2020-05-30 MED ORDER — ONDANSETRON HCL 4 MG/2ML IJ SOLN
INTRAMUSCULAR | Status: AC
  Filled 2020-05-30: qty 6

## 2020-05-30 MED ORDER — FENTANYL CITRATE (PF) 100 MCG/2ML IJ SOLN
INTRAMUSCULAR | Status: DC | PRN
Start: 2020-05-30 — End: 2020-05-30
  Administered 2020-05-30: 50 ug via INTRAVENOUS

## 2020-05-30 MED ORDER — PROPOFOL 10 MG/ML IV BOLUS
INTRAVENOUS | Status: AC
  Filled 2020-05-30: qty 40

## 2020-05-30 MED ORDER — POVIDONE-IODINE 10 % EX SWAB
2.0000 "application " | Freq: Once | CUTANEOUS | Status: AC
  Administered 2020-05-30: 2 via TOPICAL

## 2020-05-30 MED ORDER — BUPIVACAINE LIPOSOME 1.3 % IJ SUSP
INTRAMUSCULAR | Status: DC | PRN
  Administered 2020-05-30: 10 mL

## 2020-05-30 MED ORDER — BUPIVACAINE-EPINEPHRINE (PF) 0.25% -1:200000 IJ SOLN
INTRAMUSCULAR | Status: AC
  Filled 2020-05-30: qty 30

## 2020-05-30 MED ORDER — PROPOFOL 1000 MG/100ML IV EMUL
INTRAVENOUS | Status: AC
  Filled 2020-05-30: qty 100

## 2020-05-30 MED ORDER — BISACODYL 5 MG PO TBEC: 5.0000 mg | DELAYED_RELEASE_TABLET | Freq: Every day | ORAL | Status: DC | PRN

## 2020-05-30 MED ORDER — DOCUSATE SODIUM 100 MG PO CAPS
100.0000 mg | ORAL_CAPSULE | Freq: Two times a day (BID) | ORAL | Status: DC
  Administered 2020-05-30 – 2020-05-31 (×2): 100 mg via ORAL
  Filled 2020-05-30 (×2): qty 1

## 2020-05-30 MED ORDER — PROPOFOL 10 MG/ML IV BOLUS
INTRAVENOUS | Status: DC | PRN
  Administered 2020-05-30: 10 mg via INTRAVENOUS

## 2020-05-30 MED ORDER — ASPIRIN EC 81 MG PO TBEC: 81.0000 mg | DELAYED_RELEASE_TABLET | Freq: Two times a day (BID) | ORAL | 0 refills | Status: DC

## 2020-05-30 MED ORDER — FENTANYL CITRATE (PF) 100 MCG/2ML IJ SOLN
25.0000 ug | INTRAMUSCULAR | Status: DC | PRN
  Administered 2020-05-30: 25 ug via INTRAVENOUS

## 2020-05-30 MED ORDER — PROPOFOL 500 MG/50ML IV EMUL
INTRAVENOUS | Status: DC | PRN
  Administered 2020-05-30: 75 ug/kg/min via INTRAVENOUS

## 2020-05-30 MED ORDER — TRANEXAMIC ACID-NACL 1000-0.7 MG/100ML-% IV SOLN
INTRAVENOUS | Status: AC
  Filled 2020-05-30: qty 100

## 2020-05-30 MED ORDER — TIZANIDINE HCL 2 MG PO TABS: 2.0000 mg | ORAL_TABLET | Freq: Four times a day (QID) | ORAL | 1 refills | Status: DC | PRN

## 2020-05-30 MED ORDER — CEFAZOLIN SODIUM-DEXTROSE 2-4 GM/100ML-% IV SOLN
2.0000 g | Freq: Four times a day (QID) | INTRAVENOUS | Status: AC
  Administered 2020-05-30 (×2): 2 g via INTRAVENOUS
  Filled 2020-05-30: qty 100

## 2020-05-30 MED ORDER — METHOCARBAMOL 500 MG PO TABS
500.0000 mg | ORAL_TABLET | Freq: Four times a day (QID) | ORAL | Status: DC | PRN
  Administered 2020-05-31: 500 mg via ORAL
  Filled 2020-05-30: qty 1

## 2020-05-30 MED ORDER — BUPIVACAINE-EPINEPHRINE (PF) 0.25% -1:200000 IJ SOLN
INTRAMUSCULAR | Status: DC | PRN
  Administered 2020-05-30: 30 mL

## 2020-05-30 MED ORDER — METOCLOPRAMIDE HCL 5 MG PO TABS
5.0000 mg | ORAL_TABLET | Freq: Three times a day (TID) | ORAL | Status: DC | PRN
  Filled 2020-05-30: qty 2

## 2020-05-30 MED ORDER — METOPROLOL TARTRATE 12.5 MG HALF TABLET
12.5000 mg | ORAL_TABLET | Freq: Two times a day (BID) | ORAL | Status: DC
  Filled 2020-05-30: qty 1

## 2020-05-30 MED ORDER — LIDOCAINE 2% (20 MG/ML) 5 ML SYRINGE
INTRAMUSCULAR | Status: AC
  Filled 2020-05-30: qty 15

## 2020-05-30 MED ORDER — STERILE WATER FOR IRRIGATION IR SOLN
Status: DC | PRN
  Administered 2020-05-30: 2000 mL

## 2020-05-30 MED ORDER — TRANEXAMIC ACID-NACL 1000-0.7 MG/100ML-% IV SOLN
1000.0000 mg | Freq: Once | INTRAVENOUS | Status: AC
  Administered 2020-05-30: 1000 mg via INTRAVENOUS

## 2020-05-30 MED ORDER — HYDROXYCHLOROQUINE SULFATE 200 MG PO TABS
200.0000 mg | ORAL_TABLET | Freq: Two times a day (BID) | ORAL | Status: DC
  Administered 2020-05-30 – 2020-05-31 (×2): 200 mg via ORAL
  Filled 2020-05-30 (×2): qty 1

## 2020-05-30 MED ORDER — PHENYLEPHRINE HCL-NACL 10-0.9 MG/250ML-% IV SOLN
INTRAVENOUS | Status: AC
  Filled 2020-05-30: qty 750

## 2020-05-30 MED ORDER — METHOCARBAMOL 500 MG IVPB - SIMPLE MED
500.0000 mg | Freq: Four times a day (QID) | INTRAVENOUS | Status: DC | PRN
  Administered 2020-05-30: 500 mg via INTRAVENOUS
  Filled 2020-05-30: qty 50

## 2020-05-30 MED ORDER — LACTATED RINGERS IV BOLUS: 250.0000 mL | Freq: Once | INTRAVENOUS | Status: DC

## 2020-05-30 MED ORDER — FOLIC ACID 1 MG PO TABS
2.0000 mg | ORAL_TABLET | Freq: Every day | ORAL | Status: DC
  Administered 2020-05-31: 2 mg via ORAL
  Filled 2020-05-30: qty 2

## 2020-05-30 MED ORDER — LACTATED RINGERS IV BOLUS
500.0000 mL | Freq: Once | INTRAVENOUS | Status: AC
  Administered 2020-05-30: 500 mL via INTRAVENOUS

## 2020-05-30 MED ORDER — MORPHINE SULFATE (PF) 4 MG/ML IV SOLN
0.5000 mg | INTRAVENOUS | Status: DC | PRN
  Administered 2020-05-30: 1 mg via INTRAVENOUS

## 2020-05-30 MED ORDER — EPHEDRINE SULFATE-NACL 50-0.9 MG/10ML-% IV SOSY
PREFILLED_SYRINGE | INTRAVENOUS | Status: DC | PRN
  Administered 2020-05-30 (×2): 10 mg via INTRAVENOUS

## 2020-05-30 MED ORDER — CHLORHEXIDINE GLUCONATE 0.12 % MT SOLN
15.0000 mL | Freq: Once | OROMUCOSAL | Status: AC
  Administered 2020-05-30: 15 mL via OROMUCOSAL

## 2020-05-30 MED ORDER — HYDROCODONE-ACETAMINOPHEN 5-325 MG PO TABS: 1.0000 | ORAL_TABLET | Freq: Four times a day (QID) | ORAL | 0 refills | Status: DC | PRN

## 2020-05-30 MED ORDER — SPIRONOLACTONE 12.5 MG HALF TABLET
12.5000 mg | ORAL_TABLET | Freq: Every day | ORAL | Status: DC
  Administered 2020-05-31: 12.5 mg via ORAL
  Filled 2020-05-30: qty 1

## 2020-05-30 MED ORDER — ONDANSETRON HCL 4 MG/2ML IJ SOLN
INTRAMUSCULAR | Status: DC | PRN
  Administered 2020-05-30: 4 mg via INTRAVENOUS

## 2020-05-30 MED ORDER — PHENOL 1.4 % MT LIQD: 1.0000 | OROMUCOSAL | Status: DC | PRN

## 2020-05-30 MED ORDER — PREDNISONE 5 MG PO TABS
5.0000 mg | ORAL_TABLET | Freq: Every day | ORAL | Status: DC
  Administered 2020-05-31: 5 mg via ORAL
  Filled 2020-05-30: qty 1

## 2020-05-30 MED ORDER — ACETAMINOPHEN 500 MG PO TABS
500.0000 mg | ORAL_TABLET | Freq: Four times a day (QID) | ORAL | Status: AC
  Administered 2020-05-30 – 2020-05-31 (×4): 500 mg via ORAL
  Filled 2020-05-30 (×3): qty 1

## 2020-05-30 MED ORDER — BENAZEPRIL HCL 20 MG PO TABS
20.0000 mg | ORAL_TABLET | Freq: Every day | ORAL | Status: DC
  Administered 2020-05-31: 20 mg via ORAL
  Filled 2020-05-30: qty 1

## 2020-05-30 MED ORDER — ALLOPURINOL 100 MG PO TABS
200.0000 mg | ORAL_TABLET | Freq: Every day | ORAL | Status: DC
  Administered 2020-05-31: 200 mg via ORAL
  Filled 2020-05-30: qty 2

## 2020-05-30 MED ORDER — TRANEXAMIC ACID-NACL 1000-0.7 MG/100ML-% IV SOLN
1000.0000 mg | INTRAVENOUS | Status: AC
  Administered 2020-05-30: 1000 mg via INTRAVENOUS
  Filled 2020-05-30: qty 100

## 2020-05-30 MED ORDER — ORAL CARE MOUTH RINSE: 15.0000 mL | Freq: Once | OROMUCOSAL | Status: AC

## 2020-05-30 MED ORDER — ONDANSETRON HCL 4 MG PO TABS
4.0000 mg | ORAL_TABLET | Freq: Four times a day (QID) | ORAL | Status: DC | PRN
  Filled 2020-05-30: qty 1

## 2020-05-30 MED ORDER — KETOROLAC TROMETHAMINE 15 MG/ML IJ SOLN
7.5000 mg | Freq: Four times a day (QID) | INTRAMUSCULAR | Status: AC
  Administered 2020-05-30 – 2020-05-31 (×4): 7.5 mg via INTRAVENOUS
  Filled 2020-05-30 (×3): qty 1

## 2020-05-30 MED ORDER — ASPIRIN 81 MG PO CHEW
81.0000 mg | CHEWABLE_TABLET | Freq: Two times a day (BID) | ORAL | Status: DC
  Administered 2020-05-31: 81 mg via ORAL
  Filled 2020-05-30: qty 1

## 2020-05-30 MED ORDER — SENNOSIDES-DOCUSATE SODIUM 8.6-50 MG PO TABS: 1.0000 | ORAL_TABLET | Freq: Every day | ORAL | Status: DC | PRN

## 2020-05-30 MED ORDER — ATORVASTATIN CALCIUM 40 MG PO TABS
40.0000 mg | ORAL_TABLET | Freq: Every day | ORAL | Status: DC
  Administered 2020-05-30 – 2020-05-31 (×2): 40 mg via ORAL
  Filled 2020-05-30 (×2): qty 1

## 2020-05-30 MED ORDER — ALUM & MAG HYDROXIDE-SIMETH 200-200-20 MG/5ML PO SUSP: 30.0000 mL | ORAL | Status: DC | PRN

## 2020-05-30 MED ORDER — 0.9 % SODIUM CHLORIDE (POUR BTL) OPTIME
TOPICAL | Status: DC | PRN
  Administered 2020-05-30: 1000 mL

## 2020-05-30 MED ORDER — HYDROCODONE-ACETAMINOPHEN 5-325 MG PO TABS
1.0000 | ORAL_TABLET | ORAL | Status: DC | PRN
  Administered 2020-05-30 (×2): 1 via ORAL
  Administered 2020-05-31: 2 via ORAL
  Filled 2020-05-30: qty 2
  Filled 2020-05-30: qty 1

## 2020-05-30 MED ORDER — DIPHENHYDRAMINE HCL 12.5 MG/5ML PO ELIX: 12.5000 mg | ORAL_SOLUTION | ORAL | Status: DC | PRN

## 2020-05-30 MED ORDER — PHENYLEPHRINE HCL-NACL 10-0.9 MG/250ML-% IV SOLN
INTRAVENOUS | Status: DC | PRN
  Administered 2020-05-30: 10 ug/min via INTRAVENOUS

## 2020-05-30 SURGICAL SUPPLY — 45 items
BAG DECANTER FOR FLEXI CONT (MISCELLANEOUS) ×3 IMPLANT
BLADE SAW SGTL 18X1.27X75 (BLADE) ×2 IMPLANT
BLADE SAW SGTL 18X1.27X75MM (BLADE) ×1
BOOTIES KNEE HIGH SLOAN (MISCELLANEOUS) ×3 IMPLANT
CELLS DAT CNTRL 66122 CELL SVR (MISCELLANEOUS) ×1 IMPLANT
COVER PERINEAL POST (MISCELLANEOUS) ×3 IMPLANT
COVER SURGICAL LIGHT HANDLE (MISCELLANEOUS) ×3 IMPLANT
COVER WAND RF STERILE (DRAPES) ×3 IMPLANT
DECANTER SPIKE VIAL GLASS SM (MISCELLANEOUS) ×3 IMPLANT
DRAPE IMP U-DRAPE 54X76 (DRAPES) ×3 IMPLANT
DRAPE ORTHO SPLIT 77X108 STRL (DRAPES)
DRAPE STERI IOBAN 125X83 (DRAPES) ×3 IMPLANT
DRAPE SURG ORHT 6 SPLT 77X108 (DRAPES) IMPLANT
DRAPE U-SHAPE 47X51 STRL (DRAPES) ×6 IMPLANT
DRSG AQUACEL AG ADV 3.5X 6 (GAUZE/BANDAGES/DRESSINGS) ×3 IMPLANT
DURAPREP 26ML APPLICATOR (WOUND CARE) ×3 IMPLANT
ELECT BLADE TIP CTD 4 INCH (ELECTRODE) ×3 IMPLANT
ELECT REM PT RETURN 15FT ADLT (MISCELLANEOUS) ×3 IMPLANT
ELIMINATOR HOLE APEX DEPUY (Hips) ×3 IMPLANT
GLOVE BIO SURGEON STRL SZ8 (GLOVE) ×6 IMPLANT
GLOVE BIOGEL PI IND STRL 8 (GLOVE) ×2 IMPLANT
GLOVE BIOGEL PI INDICATOR 8 (GLOVE) ×4
GOWN STRL REUS W/TWL XL LVL3 (GOWN DISPOSABLE) ×6 IMPLANT
HEAD FEM STD 32X+1 STRL (Hips) ×3 IMPLANT
HOLDER FOLEY CATH W/STRAP (MISCELLANEOUS) ×3 IMPLANT
KIT TURNOVER KIT A (KITS) IMPLANT
LINER ACET PNNCL PLUS4 NEUTRAL (Hips) ×1 IMPLANT
LINER PINN ACET GRIP 50X100 ×3 IMPLANT
MANIFOLD NEPTUNE II (INSTRUMENTS) ×3 IMPLANT
NEEDLE HYPO 22GX1.5 SAFETY (NEEDLE) ×3 IMPLANT
NS IRRIG 1000ML POUR BTL (IV SOLUTION) ×3 IMPLANT
PACK ANTERIOR HIP CUSTOM (KITS) ×3 IMPLANT
PENCIL SMOKE EVACUATOR (MISCELLANEOUS) IMPLANT
PINNACLE PLUS 4 NEUTRAL (Hips) ×3 IMPLANT
PROTECTOR NERVE ULNAR (MISCELLANEOUS) ×3 IMPLANT
RTRCTR WOUND ALEXIS 18CM MED (MISCELLANEOUS) ×3
STEM FEM ACTIS STD SZ7 (Nail) ×3 IMPLANT
SUT ETHIBOND NAB CT1 #1 30IN (SUTURE) ×6 IMPLANT
SUT VIC AB 1 CT1 36 (SUTURE) ×3 IMPLANT
SUT VIC AB 2-0 CT1 27 (SUTURE) ×3
SUT VIC AB 2-0 CT1 TAPERPNT 27 (SUTURE) ×1 IMPLANT
SUT VICRYL AB 3-0 FS1 BRD 27IN (SUTURE) ×3 IMPLANT
SUT VLOC 180 0 24IN GS25 (SUTURE) ×3 IMPLANT
SYR 50ML LL SCALE MARK (SYRINGE) ×3 IMPLANT
TRAY FOLEY MTR SLVR 16FR STAT (SET/KITS/TRAYS/PACK) ×3 IMPLANT

## 2020-05-30 NOTE — Progress Notes (Signed)
Pt expressing concerns of imminent discharge. Pt reports she was completely unaware of same day discharge and feels there is no one able to care for her.  PA advised and talked to pt.  PA reported MD talked to son and family ready and able to care for pt.  Pt verbalized that her daughter is at beach, coming home to help tomorrow.  Pt reassured she will be able to stay in house if needed.

## 2020-05-30 NOTE — Transfer of Care (Signed)
Immediate Anesthesia Transfer of Care Note  Patient: Patricia Villegas  Procedure(s) Performed: Procedure(s): RIGHT TOTAL HIP ARTHROPLASTY ANTERIOR APPROACH (Right)  Patient Location: PACU  Anesthesia Type:Spinal  Level of Consciousness:  sedated, patient cooperative and responds to stimulation  Airway & Oxygen Therapy:Patient Spontanous Breathing and Patient connected to face mask oxgen  Post-op Assessment:  Report given to PACU RN and Post -op Vital signs reviewed and stable  Post vital signs:  Reviewed and stable  Last Vitals:  Vitals:   05/30/20 0549  BP: 134/65  Pulse: 62  Resp: 15  Temp: 36.8 C  SpO2: 484%    Complications: No apparent anesthesia complications

## 2020-05-30 NOTE — Op Note (Signed)

## 2020-05-30 NOTE — Anesthesia Procedure Notes (Addendum)
Spinal  Patient location during procedure: OR Start time: 05/30/2020 7:30 AM End time: 05/30/2020 7:34 AM Staffing Performed: anesthesiologist  Anesthesiologist: Nolon Nations, MD Preanesthetic Checklist Completed: patient identified, IV checked, site marked, risks and benefits discussed, surgical consent, monitors and equipment checked, pre-op evaluation and timeout performed Spinal Block Patient position: sitting Prep: DuraPrep and site prepped and draped Patient monitoring: heart rate, cardiac monitor, continuous pulse ox and blood pressure Approach: right paramedian Location: L2-3 Injection technique: single-shot Needle Needle type: Sprotte  Needle gauge: 24 G Needle length: 9 cm Assessment Sensory level: T4

## 2020-05-30 NOTE — Interval H&P Note (Signed)
History and Physical Interval Note:  05/30/2020 7:27 AM  Patricia Villegas  has presented today for surgery, with the diagnosis of RIGHT HIP DEGENERATIVE JOINT DISEASE.  The various methods of treatment have been discussed with the patient and family. After consideration of risks, benefits and other options for treatment, the patient has consented to  Procedure(s): RIGHT TOTAL HIP ARTHROPLASTY ANTERIOR APPROACH (Right) as a surgical intervention.  The patient's history has been reviewed, patient examined, no change in status, stable for surgery.  I have reviewed the patient's chart and labs.  Questions were answered to the patient's satisfaction.     Hessie Dibble

## 2020-05-31 ENCOUNTER — Encounter (HOSPITAL_COMMUNITY): Payer: Self-pay | Admitting: Orthopaedic Surgery

## 2020-05-31 DIAGNOSIS — M1611 Unilateral primary osteoarthritis, right hip: Secondary | ICD-10-CM | POA: Diagnosis not present

## 2020-05-31 NOTE — Progress Notes (Signed)
Subjective: 1 Day Post-Op Procedure(s) (LRB): RIGHT TOTAL HIP ARTHROPLASTY ANTERIOR APPROACH (Right)   Patient is feeling better this morning. She is hoping to go home today.  Activity level:  wbat Diet tolerance:  ok Voiding:  Foley out this morning Patient reports pain as mild.    Objective: Vital signs in last 24 hours: Temp:  [97.2 F (36.2 C)-98.4 F (36.9 C)] 98.2 F (36.8 C) (09/22 0603) Pulse Rate:  [58-75] 63 (09/22 0603) Resp:  [10-21] 18 (09/22 0603) BP: (99-129)/(43-86) 116/56 (09/22 0603) SpO2:  [93 %-100 %] 100 % (09/22 0603)  Labs: No results for input(s): HGB in the last 72 hours. No results for input(s): WBC, RBC, HCT, PLT in the last 72 hours. No results for input(s): NA, K, CL, CO2, BUN, CREATININE, GLUCOSE, CALCIUM in the last 72 hours. No results for input(s): LABPT, INR in the last 72 hours.  Physical Exam:  Neurologically intact ABD soft Neurovascular intact Sensation intact distally Intact pulses distally Dorsiflexion/Plantar flexion intact Incision: dressing C/D/I and no drainage No cellulitis present Compartment soft  Assessment/Plan:  1 Day Post-Op Procedure(s) (LRB): RIGHT TOTAL HIP ARTHROPLASTY ANTERIOR APPROACH (Right) Advance diet Up with therapy D/C IV fluids Discharge home with home health today after PT if cleared and doing well. Follow up in office 2 weeks post op. Continue on 81mg  asa BID x 4 weeks post op for DVT prevention.  Larwance Sachs Wynonia Medero 05/31/2020, 8:16 AM

## 2020-05-31 NOTE — Progress Notes (Signed)
Physical Therapy Treatment Patient Details Name: Patricia Villegas MRN: [id number] DOB: June 11, 1935 Today's Date: 05/31/2020    History of Present Illness Pt is an 85 year old female s/p R THA . PMH of HTN, [name], anxiety, depression, peripheral neuropathy, CAD s/p CABG, RA and lumbar disc disease s/p multiple surgery, L THA    PT Comments    Son present and observed pt ambulate and practice safe stair technique.  Pt and son had no further questions.  Provided HEP handout and pt plans to f/u with HHPT.  Pt to d/c home.   Follow Up Recommendations  Home health PT;Follow surgeon's recommendation for DC plan and follow-up therapies     Equipment Recommendations  None recommended by PT    Recommendations for Other Services       Precautions / Restrictions Precautions Precautions: Fall Restrictions Weight Bearing Restrictions: No    Mobility  Bed Mobility Overal bed mobility: Needs Assistance Bed Mobility: Supine to Sit     Supine to sit: Min assist     General bed mobility comments: assist for R LE  Transfers Overall transfer level: Needs assistance Equipment used: Rolling walker (2 wheeled) Transfers: Sit to/from Stand Sit to Stand: Min guard         General transfer comment: verbal cues for UE and LE positioning, increased time  Ambulation/Gait Ambulation/Gait assistance: Min guard Gait Distance (Feet): 50 Feet Assistive device: Rolling walker (2 wheeled) Gait Pattern/deviations: Step-to pattern;Decreased stance time - right;Antalgic     General Gait Details: verbal cues for sequence, RW positioning, use of UEs on RW for pain management   Stairs Stairs: Yes Stairs assistance: Min guard Stair Management: Step to pattern;Forwards;Two rails Number of Stairs: 3 General stair comments: verbal cues for sequence and safety, son present and had no questions   Wheelchair Mobility    Modified Rankin (Stroke Patients Only)       Balance                                             Cognition Arousal/Alertness: Awake/alert Behavior During Therapy: WFL for tasks assessed/performed Overall Cognitive Status: Within Functional Limits for tasks assessed                                        Exercises    General Comments        Pertinent Vitals/Pain Pain Assessment: 0-10 Pain Score: 4  Pain Location: right hip Pain Descriptors / Indicators: Aching;Sore Pain Intervention(s): Monitored during session;Repositioned    Home Living Family/patient expects to be discharged to:: Private residence Living Arrangements: Spouse/significant other Available Help at Discharge: Family;Available 24 hours/day Type of Home: House Home Access: Stairs to enter Entrance Stairs-Rails: Can reach both Home Layout: Able to live on main level with bedroom/bathroom Home Equipment: Walker - 2 wheels;Cane - single point;Bedside commode      Prior Function Level of Independence: Independent with assistive device(s)          PT Goals (current goals can now be found in the care plan section) Acute Rehab PT Goals PT Goal Formulation: With patient Time For Goal Achievement: 06/03/20 Potential to Achieve Goals: Good Progress towards PT goals: Progressing toward goals    Frequency    7X/week  PT Plan Current plan remains appropriate    Co-evaluation              AM-PAC PT "6 Clicks" Mobility   Outcome Measure  Help needed turning from your back to your side while in a flat bed without using bedrails?: A Little Help needed moving from lying on your back to sitting on the side of a flat bed without using bedrails?: A Little Help needed moving to and from a bed to a chair (including a wheelchair)?: A Little Help needed standing up from a chair using your arms (e.g., wheelchair or bedside chair)?: A Little Help needed to walk in hospital room?: A Little Help needed climbing 3-5 steps with a railing? : A Little 6  Click Score: 18    End of Session Equipment Utilized During Treatment: Gait belt Activity Tolerance: Patient tolerated treatment well Patient left: in chair;with call bell/phone within reach;with nursing/sitter in room Nurse Communication: Mobility status PT Visit Diagnosis: Other abnormalities of gait and mobility (R26.89)     Time: 4665-9935 PT Time Calculation (min) (ACUTE ONLY): 16 min  Charges:  $Gait Training: 8-22 mins                      {Kati PT, DPT Acute Rehabilitation Services Pager: [phone] Office: [place] 05/31/2020, 12:18 PM

## 2020-05-31 NOTE — Progress Notes (Signed)
Patient and family member given discharge instructions, and answered all questions.  Patient taken to main exit in wheelchair.

## 2020-05-31 NOTE — Evaluation (Signed)
Physical Therapy Evaluation Patient Details Name: Patricia Villegas MRN: [id number] DOB: [birth year] Today's Date: 05/31/2020   History of Present Illness  Pt is an 85 year old female s/p R THA . PMH of HTN, [name], anxiety, depression, peripheral neuropathy, CAD s/p CABG, RA and lumbar disc disease s/p multiple surgery, L THA  Clinical Impression  Pt is s/p THA resulting in the deficits listed below (see PT Problem List).  Pt will benefit from skilled PT to increase their independence and safety with mobility to allow discharge to the venue listed below.  Pt assisted with ambulating in hallway and performing exercises.  Pt fatigued quickly and breakfast arrived. Pt to have son assist home so requested pt ask son to observe next session for stair training.       Follow Up Recommendations Home health PT;Follow surgeon's recommendation for DC plan and follow-up therapies    Equipment Recommendations  None recommended by PT    Recommendations for Other Services       Precautions / Restrictions Precautions Precautions: Fall Restrictions Weight Bearing Restrictions: No      Mobility  Bed Mobility Overal bed mobility: Needs Assistance Bed Mobility: Supine to Sit     Supine to sit: Min assist     General bed mobility comments: assist for R LE  Transfers Overall transfer level: Needs assistance Equipment used: Rolling walker (2 wheeled) Transfers: Sit to/from Stand Sit to Stand: Min guard         General transfer comment: verbal cues for UE and LE positioning, increased time  Ambulation/Gait Ambulation/Gait assistance: Min guard Gait Distance (Feet): 120 Feet Assistive device: Rolling walker (2 wheeled) Gait Pattern/deviations: Step-to pattern;Decreased stance time - right;Antalgic     General Gait Details: verbal cues for sequence, RW positioning, use of UEs on RW for pain management  Stairs            Wheelchair Mobility    Modified Rankin (Stroke Patients  Only)       Balance                                             Pertinent Vitals/Pain Pain Assessment: 0-10 Pain Score: 4  Pain Location: right hip Pain Descriptors / Indicators: Aching;Sore Pain Intervention(s): Monitored during session;Repositioned    Home Living Family/patient expects to be discharged to:: Private residence Living Arrangements: Spouse/significant other Available Help at Discharge: Family;Available 24 hours/day Type of Home: House Home Access: Stairs to enter Entrance Stairs-Rails: Can reach both Entrance Stairs-Number of Steps: 4 Home Layout: Able to live on main level with bedroom/bathroom Home Equipment: Walker - 2 wheels;Cane - single point;Bedside commode      Prior Function Level of Independence: Independent with assistive device(s)               Hand Dominance        Extremity/Trunk Assessment        Lower Extremity Assessment Lower Extremity Assessment: RLE deficits/detail RLE Deficits / Details: required assist for bed mobility, able to perform ankle pumps, anticipated post op hip weakness observed       Communication   Communication: No difficulties  Cognition Arousal/Alertness: Awake/alert Behavior During Therapy: WFL for tasks assessed/performed Overall Cognitive Status: Within Functional Limits for tasks assessed  General Comments      Exercises Total Joint Exercises Ankle Circles/Pumps: AROM;Both;10 reps Quad Sets: AROM;Right;10 reps Heel Slides: AAROM;Right;10 reps Hip ABduction/ADduction: AAROM;Right;10 reps Long Arc Quad: AROM;Right;Seated;10 reps   Assessment/Plan    PT Assessment Patient needs continued PT services  PT Problem List Decreased strength;Decreased mobility;Decreased knowledge of use of DME;Decreased balance;Decreased knowledge of precautions;Pain       PT Treatment Interventions Stair training;Gait training;Balance  training;DME instruction;Therapeutic exercise;Functional mobility training;Therapeutic activities;Patient/family education    PT Goals (Current goals can be found in the Care Plan section)  Acute Rehab PT Goals PT Goal Formulation: With patient Time For Goal Achievement: 06/03/20 Potential to Achieve Goals: Good    Frequency 7X/week   Barriers to discharge        Co-evaluation               AM-PAC PT "6 Clicks" Mobility  Outcome Measure Help needed turning from your back to your side while in a flat bed without using bedrails?: A Little Help needed moving from lying on your back to sitting on the side of a flat bed without using bedrails?: A Little Help needed moving to and from a bed to a chair (including a wheelchair)?: A Little Help needed standing up from a chair using your arms (e.g., wheelchair or bedside chair)?: A Little Help needed to walk in hospital room?: A Little Help needed climbing 3-5 steps with a railing? : A Lot 6 Click Score: 17    End of Session Equipment Utilized During Treatment: Gait belt Activity Tolerance: Patient tolerated treatment well Patient left: in chair;with call bell/phone within reach;with chair alarm set Nurse Communication: Mobility status PT Visit Diagnosis: Other abnormalities of gait and mobility (R26.89)    Time: 3291-9166 PT Time Calculation (min) (ACUTE ONLY): 26 min   Charges:   PT Evaluation $PT Eval Low Complexity: 1 Low PT Treatments $Therapeutic Exercise: 8-22 mins      Jannette Spanner PT, DPT Acute Rehabilitation Services Pager: [phone] Office: [phone]  [place] Ram E 05/31/2020, 12:12 PM

## 2020-05-31 NOTE — TOC Transition Note (Signed)
Transition of Care [facility]) - CM/SW Discharge Note   Patient Details  Name: KERIANNA RAWLINSON MRN: [id number] Date of Birth: Sep 06, 1935  Transition of Care (TOC) CM/SW Contact:  Lennart Pall, LCSW Phone Number: 05/31/2020, 10:00 AM   Clinical Narrative:    Met with pt this morning to review dc needs.  Pt confirms that she already has a rolling walker and bedside commode at home.  She is agreeable to HHPT and has no agency preference - referral placed with [facility].  Anticipate dc today.   Final next level of care: [place] Barriers to Discharge: No Barriers Identified   Patient Goals and CMS Choice Patient states their goals for this hospitalization and ongoing recovery are:: return home today if possible CMS Medicare.gov Compare Post Acute Care list provided to:: Patient Choice offered to / list presented to : Patient  Discharge Placement                       Discharge Plan and Services                DME Arranged: N/A DME Agency: NA       HH Arranged: PT HH Agency: [place] Date [place]: 05/31/20 Time [place]: [phone] Representative spoke with at [place]: Adela Lank  Social Determinants of Health (SDOH) Interventions     Readmission Risk Interventions No flowsheet data found.

## 2020-05-31 NOTE — Discharge Summary (Signed)
Patient ID: Patricia Villegas MRN: [id number] DOB/AGE: [birth year] 83 y.o.  Admit date: 05/30/2020 Discharge date: 05/31/2020  Admission Diagnoses:  Principal Problem:   Primary localized osteoarthritis of right hip Active Problems:   S/P total hip arthroplasty   Primary osteoarthritis of right hip   Discharge Diagnoses:  Same  Past Medical History:  Diagnosis Date  . Anemia   . Anxiety   . Arthritis   . Blood transfusion   . Chest pain    a. Lexiscan Myoview 12/12:  EF 75%, no ischemia or scar;  b.  Echo 12/12:  EF 60-65%, no sig valvular abnormalities   . Closed left fibular fracture    Casted   . Complication of anesthesia 1998   Unable to arouse fully, respiratory depression, pt. reports that the family was told that she would need a trach., but then she stabilized & didn't need it   . Coronary artery disease    sees Dr Aundra Dubin every 6 months  . Depression   . Diabetes mellitus    Greater than 20 years type 2   . GERD (gastroesophageal reflux disease)   . H/O hiatal hernia   . Hypercholesteremia   . Hypertension    Greater than 20 years  . Lumbar disc disease    S/p surgery x4;  Sciatica   . Neuropathy due to secondary diabetes ([place])    Hx: of  . Parastomal hernia of ileal conduit ([place]) 2009  . Pneumonia    s/p CABG  . [name] (peripheral vascular disease) ([facility])    Stable claudication. Arteriogram 07/2009 no significant aortoiliac disease, has bilateral SFA occlusions with distal reconstitution   . Rheumatoid arthritis ([place])    Dr Berna Bue @ [facility]  . Transfusion reaction 1960   Convulsions (? seizure)  . UTI (lower urinary tract infection) 01/03/15    Surgeries: Procedure(s): RIGHT TOTAL HIP ARTHROPLASTY ANTERIOR APPROACH on 05/30/2020   Consultants:   Discharged Condition: Improved  Hospital Course: Patricia Villegas is an 85 y.o. female who was admitted 05/30/2020 for operative treatment ofPrimary localized osteoarthritis of right hip. Patient has  severe unremitting pain that affects sleep, daily activities, and work/hobbies. After pre-op clearance the patient was taken to the operating room on 05/30/2020 and underwent  Procedure(s): RIGHT TOTAL HIP ARTHROPLASTY ANTERIOR APPROACH.    Patient was given perioperative antibiotics:  Anti-infectives (From admission, onward)   Start     Dose/Rate Route Frequency Ordered Stop   05/30/20 2200  hydroxychloroquine (PLAQUENIL) tablet 200 mg        200 mg Oral 2 times daily 05/30/20 1811     05/30/20 1429  ceFAZolin (ANCEF) 2-4 GM/100ML-% IVPB       Note to Pharmacy: Floreen Comber   : cabinet override      05/30/20 1429 05/30/20 1437   05/30/20 1400  ceFAZolin (ANCEF) IVPB 2g/100 mL premix        2 g 200 mL/hr over 30 Minutes Intravenous Every 6 hours 05/30/20 0917 05/30/20 2026   05/30/20 0600  ceFAZolin (ANCEF) IVPB 2g/100 mL premix        2 g 200 mL/hr over 30 Minutes Intravenous On call to O.R. 05/30/20 0529 05/30/20 0740       Patient was given sequential compression devices, early ambulation, and chemoprophylaxis to prevent DVT.  Patient benefited maximally from hospital stay and there were no complications.    Recent vital signs:  Patient Vitals for the past 24 hrs:  BP Temp Temp  src Pulse Resp SpO2 Height  05/31/20 0603 (!) 116/56 98.2 F (36.8 C) Oral 63 18 100 % --  05/31/20 0210 (!) 106/46 98.3 F (36.8 C) Oral 66 16 100 % --  05/30/20 2153 (!) 113/55 98.2 F (36.8 C) Oral 69 18 98 % --  05/30/20 1833 -- -- -- -- -- -- 5\' 6"  (1.676 m)  05/30/20 1804 (!) 102/47 98.4 F (36.9 C) Oral 68 15 98 % --  05/30/20 1700 (!) 99/45 98.1 F (36.7 C) -- 62 16 93 % --  05/30/20 1600 (!) 102/52 98 F (36.7 C) -- 65 17 93 % --  05/30/20 1500 112/76 98.1 F (36.7 C) -- 67 17 95 % --  05/30/20 1430 -- -- -- 72 (!) 21 94 % --  05/30/20 1400 (!) 111/51 98.2 F (36.8 C) -- 75 12 97 % --  05/30/20 1300 (!) 110/47 98 F (36.7 C) -- 72 12 95 % --  05/30/20 1200 110/64 -- -- 64 19 96 %  --  05/30/20 1145 (!) 116/47 97.8 F (36.6 C) -- 63 16 95 % --  05/30/20 1130 102/78 -- -- 63 17 96 % --  05/30/20 1115 (!) 128/44 97.7 F (36.5 C) -- 62 14 95 % --  05/30/20 1100 (!) 119/57 97.6 F (36.4 C) -- 61 16 94 % --  05/30/20 1045 (!) 114/46 97.8 F (36.6 C) -- 61 16 94 % --  05/30/20 1030 (!) 123/50 97.7 F (36.5 C) -- 60 14 97 % --  05/30/20 1015 (!) 122/50 (!) 97.5 F (36.4 C) -- (!) 58 15 99 % --  05/30/20 1000 (!) 106/43 (!) 97.5 F (36.4 C) -- 61 10 100 % --  05/30/20 0945 (!) 129/58 (!) 97.2 F (36.2 C) -- 66 13 100 % --  05/30/20 0930 114/86 -- -- (!) 59 10 98 % --  05/30/20 0915 (!) 104/52 -- -- 66 18 100 % --  05/30/20 0911 (!) 109/44 -- -- 63 16 100 % --     Recent laboratory studies: No results for input(s): WBC, HGB, HCT, PLT, NA, K, CL, CO2, BUN, CREATININE, GLUCOSE, INR, CALCIUM in the last 72 hours.  Invalid input(s): PT, 2   Discharge Medications:   Allergies as of 05/31/2020   No Known Allergies     Medication List    STOP taking these medications   methotrexate 2.5 MG tablet     TAKE these medications   allopurinol 100 MG tablet Commonly known as: ZYLOPRIM Take 200 mg by mouth daily.   aspirin EC 81 MG tablet Take 1 tablet (81 mg total) by mouth 2 (two) times daily after a meal. What changed: when to take this   benazepril 20 MG tablet Commonly known as: LOTENSIN Take 20 mg by mouth daily.   folic acid 1 MG tablet Commonly known as: FOLVITE Take 2 mg by mouth daily.   furosemide 20 MG tablet Commonly known as: LASIX Take 20 mg by mouth daily.   HYDROcodone-acetaminophen 5-325 MG tablet Commonly known as: NORCO/VICODIN Take 1-2 tablets by mouth every 6 (six) hours as needed for moderate pain or severe pain. What changed:   how much to take  when to take this   hydroxychloroquine 200 MG tablet Commonly known as: PLAQUENIL Take 200 mg by mouth 2 (two) times daily.   Lantus SoloStar 100 UNIT/ML Solostar Pen Generic drug:  insulin glargine Inject 10 Units into the skin daily. Patient reports she takes at 2pm  daily per pt   metoprolol tartrate 25 MG tablet Commonly known as: LOPRESSOR Take 0.5 tablets (12.5 mg total) by mouth 2 (two) times daily.   ONE TOUCH ULTRA TEST test strip Generic drug: glucose blood   predniSONE 5 MG tablet Commonly known as: DELTASONE Take 5 mg by mouth daily with breakfast.   senna-docusate 8.6-50 MG tablet Commonly known as: Senokot-S Take 1 tablet by mouth daily as needed for mild constipation.   simvastatin 80 MG tablet Commonly known as: ZOCOR Take 80 mg by mouth at bedtime.   spironolactone 25 MG tablet Commonly known as: ALDACTONE Take 0.5 tablets (12.5 mg total) by mouth daily.   tiZANidine 2 MG tablet Commonly known as: ZANAFLEX Take 1-2 tablets (2-4 mg total) by mouth every 6 (six) hours as needed for muscle spasms.            Durable Medical Equipment  (From admission, onward)         Start     Ordered   05/30/20 1812  DME Walker rolling  Once       Question:  Patient needs a walker to treat with the following condition  Answer:  Primary osteoarthritis of right hip   05/30/20 1811   05/30/20 1812  DME 3 n 1  Once        05/30/20 1811   05/30/20 1812  DME Bedside commode  Once       Question:  Patient needs a bedside commode to treat with the following condition  Answer:  Primary osteoarthritis of right hip   05/30/20 1811          Diagnostic Studies: DG C-Arm 1-60 Min-No Report  Result Date: 05/30/2020 Fluoroscopy was utilized by the requesting physician.  No radiographic interpretation.   DG HIP OPERATIVE UNILAT W OR W/O PELVIS RIGHT  Result Date: 05/30/2020 CLINICAL DATA:  Total hip replacement. EXAM: OPERATIVE RIGHT HIP (WITH PELVIS IF PERFORMED) 1 VIEW TECHNIQUE: Fluoroscopic spot image(s) were submitted for interpretation post-operatively. COMPARISON:  01/10/2015. FINDINGS: Total right hip replacement. Hardware intact. Anatomic  alignment. Peripheral vascular calcification. Fluoro time 0 minutes 17 seconds. Radiation dose 1.6 mGy. IMPRESSION: 1.  Total right hip replacement with anatomic alignment. 2.  Peripheral vascular disease. Electronically Signed   By: Marcello Moores  Register   On: 05/30/2020 09:17    Disposition: Discharge disposition: 01-Home or Self Care       Discharge Instructions    Call MD / Call 911   Complete by: As directed    If you experience chest pain or shortness of breath, CALL 911 and be transported to the hospital emergency room.  If you develope a fever above 101 F, pus (white drainage) or increased drainage or redness at the wound, or calf pain, call your surgeon's office.   Call MD / Call 911   Complete by: As directed    If you experience chest pain or shortness of breath, CALL 911 and be transported to the hospital emergency room.  If you develope a fever above 101 F, pus (white drainage) or increased drainage or redness at the wound, or calf pain, call your surgeon's office.   Constipation Prevention   Complete by: As directed    Drink plenty of fluids.  Prune juice may be helpful.  You may use a stool softener, such as Colace (over the counter) 100 mg twice a day.  Use MiraLax (over the counter) for constipation as needed.   Constipation Prevention   Complete by:  As directed    Drink plenty of fluids.  Prune juice may be helpful.  You may use a stool softener, such as Colace (over the counter) 100 mg twice a day.  Use MiraLax (over the counter) for constipation as needed.   Diet - low sodium heart healthy   Complete by: As directed    Diet - low sodium heart healthy   Complete by: As directed    Discharge instructions   Complete by: As directed    INSTRUCTIONS AFTER JOINT REPLACEMENT   Remove items at home which could result in a fall. This includes throw rugs or furniture in walking pathways ICE to the affected joint every three hours while awake for 30 minutes at a time, for at least  the first 3-5 days, and then as needed for pain and swelling.  Continue to use ice for pain and swelling. You may notice swelling that will progress down to the foot and ankle.  This is normal after surgery.  Elevate your leg when you are not up walking on it.   Continue to use the breathing machine you got in the hospital (incentive spirometer) which will help keep your temperature down.  It is common for your temperature to cycle up and down following surgery, especially at night when you are not up moving around and exerting yourself.  The breathing machine keeps your lungs expanded and your temperature down.   DIET:  As you were doing prior to hospitalization, we recommend a well-balanced diet.  DRESSING / WOUND CARE / SHOWERING  You may shower 3 days after surgery, but keep the wounds dry during showering.  You may use an occlusive plastic wrap (Press'n Seal for example), NO SOAKING/SUBMERGING IN THE BATHTUB.  If the bandage gets wet, change with a clean dry gauze.  If the incision gets wet, pat the wound dry with a clean towel.  ACTIVITY  Increase activity slowly as tolerated, but follow the weight bearing instructions below.   No driving for 6 weeks or until further direction given by your physician.  You cannot drive while taking narcotics.  No lifting or carrying greater than 10 lbs. until further directed by your surgeon. Avoid periods of inactivity such as sitting longer than an hour when not asleep. This helps prevent blood clots.  You may return to work once you are authorized by your doctor.     WEIGHT BEARING   Weight bearing as tolerated with assist device (walker, cane, etc) as directed, use it as long as suggested by your surgeon or therapist, typically at least 4-6 weeks.   EXERCISES  Results after joint replacement surgery are often greatly improved when you follow the exercise, range of motion and muscle strengthening exercises prescribed by your doctor. Safety  measures are also important to protect the joint from further injury. Any time any of these exercises cause you to have increased pain or swelling, decrease what you are doing until you are comfortable again and then slowly increase them. If you have problems or questions, call your caregiver or physical therapist for advice.   Rehabilitation is important following a joint replacement. After just a few days of immobilization, the muscles of the leg can become weakened and shrink (atrophy).  These exercises are designed to build up the tone and strength of the thigh and leg muscles and to improve motion. Often times heat used for twenty to thirty minutes before working out will loosen up your tissues and help with improving  the range of motion but do not use heat for the first two weeks following surgery (sometimes heat can increase post-operative swelling).   These exercises can be done on a training (exercise) mat, on the floor, on a table or on a bed. Use whatever works the best and is most comfortable for you.    Use music or television while you are exercising so that the exercises are a pleasant break in your day. This will make your life better with the exercises acting as a break in your routine that you can look forward to.   Perform all exercises about fifteen times, three times per day or as directed.  You should exercise both the operative leg and the other leg as well.  Exercises include:   Quad Sets - Tighten up the muscle on the front of the thigh (Quad) and hold for 5-10 seconds.   Straight Leg Raises - With your knee straight (if you were given a brace, keep it on), lift the leg to 60 degrees, hold for 3 seconds, and slowly lower the leg.  Perform this exercise against resistance later as your leg gets stronger.  Leg Slides: Lying on your back, slowly slide your foot toward your buttocks, bending your knee up off the floor (only go as far as is comfortable). Then slowly slide your foot back  down until your leg is flat on the floor again.  Angel Wings: Lying on your back spread your legs to the side as far apart as you can without causing discomfort.  Hamstring Strength:  Lying on your back, push your heel against the floor with your leg straight by tightening up the muscles of your buttocks.  Repeat, but this time bend your knee to a comfortable angle, and push your heel against the floor.  You may put a pillow under the heel to make it more comfortable if necessary.   A rehabilitation program following joint replacement surgery can speed recovery and prevent re-injury in the future due to weakened muscles. Contact your doctor or a physical therapist for more information on knee rehabilitation.    CONSTIPATION  Constipation is defined medically as fewer than three stools per week and severe constipation as less than one stool per week.  Even if you have a regular bowel pattern at home, your normal regimen is likely to be disrupted due to multiple reasons following surgery.  Combination of anesthesia, postoperative narcotics, change in appetite and fluid intake all can affect your bowels.   YOU MUST use at least one of the following options; they are listed in order of increasing strength to get the job done.  They are all available over the counter, and you may need to use some, POSSIBLY even all of these options:    Drink plenty of fluids (prune juice may be helpful) and high fiber foods Colace 100 mg by mouth twice a day  Senokot for constipation as directed and as needed Dulcolax (bisacodyl), take with full glass of water  Miralax (polyethylene glycol) once or twice a day as needed.  If you have tried all these things and are unable to have a bowel movement in the first 3-4 days after surgery call either your surgeon or your primary doctor.    If you experience loose stools or diarrhea, hold the medications until you stool forms back up.  If your symptoms do not get better within  1 week or if they get worse, check with your doctor.  If  you experience "the worst abdominal pain ever" or develop nausea or vomiting, please contact the office immediately for further recommendations for treatment.   ITCHING:  If you experience itching with your medications, try taking only a single pain pill, or even half a pain pill at a time.  You can also use Benadryl over the counter for itching or also to help with sleep.   TED HOSE STOCKINGS:  Use stockings on both legs until for at least 2 weeks or as directed by physician office. They may be removed at night for sleeping.  MEDICATIONS:  See your medication summary on the "After Visit Summary" that nursing will review with you.  You may have some home medications which will be placed on hold until you complete the course of blood thinner medication.  It is important for you to complete the blood thinner medication as prescribed.  PRECAUTIONS:  If you experience chest pain or shortness of breath - call 911 immediately for transfer to the hospital emergency department.   If you develop a fever greater that 101 F, purulent drainage from wound, increased redness or drainage from wound, foul odor from the wound/dressing, or calf pain - CONTACT YOUR SURGEON.                                                   FOLLOW-UP APPOINTMENTS:  If you do not already have a post-op appointment, please call the office for an appointment to be seen by your surgeon.  Guidelines for how soon to be seen are listed in your "After Visit Summary", but are typically between 1-4 weeks after surgery.  OTHER INSTRUCTIONS:   Knee Replacement:  Do not place pillow under knee, focus on keeping the knee straight while resting. CPM instructions: 0-90 degrees, 2 hours in the morning, 2 hours in the afternoon, and 2 hours in the evening. Place foam block, curve side up under heel at all times except when in CPM or when walking.  DO NOT modify, tear, cut, or change the foam block  in any way.   DENTAL ANTIBIOTICS:  In most cases prophylactic antibiotics for Dental procdeures after total joint surgery are not necessary.  Exceptions are as follows:  1. History of prior total joint infection  2. Severely immunocompromised (Organ Transplant, cancer chemotherapy, Rheumatoid biologic meds such as [place])  3. Poorly controlled diabetes (A1C &gt; 8.0, blood glucose over 200)  If you have one of these conditions, contact your surgeon for an antibiotic prescription, prior to your dental procedure.   MAKE SURE YOU:  Understand these instructions.  Get help right away if you are not doing well or get worse.    Thank you for letting us be a part of your medical care team.  It is a privilege we respect greatly.  We hope these instructions will help you stay on track for a fast and full recovery!   Discharge instructions   Complete by: As directed    INSTRUCTIONS AFTER JOINT REPLACEMENT   Remove items at home which could result in a fall. This includes throw rugs or furniture in walking pathways ICE to the affected joint every three hours while awake for 30 minutes at a time, for at least the first 3-5 days, and then as needed for pain and swelling.  Continue to use ice for pain and  swelling. You may notice swelling that will progress down to the foot and ankle.  This is normal after surgery.  Elevate your leg when you are not up walking on it.   Continue to use the breathing machine you got in the hospital (incentive spirometer) which will help keep your temperature down.  It is common for your temperature to cycle up and down following surgery, especially at night when you are not up moving around and exerting yourself.  The breathing machine keeps your lungs expanded and your temperature down.   DIET:  As you were doing prior to hospitalization, we recommend a well-balanced diet.  DRESSING / WOUND CARE / SHOWERING  You may shower 3 days after surgery, but keep the  wounds dry during showering.  You may use an occlusive plastic wrap (Press'n Seal for example), NO SOAKING/SUBMERGING IN THE BATHTUB.  If the bandage gets wet, change with a clean dry gauze.  If the incision gets wet, pat the wound dry with a clean towel.  ACTIVITY  Increase activity slowly as tolerated, but follow the weight bearing instructions below.   No driving for 6 weeks or until further direction given by your physician.  You cannot drive while taking narcotics.  No lifting or carrying greater than 10 lbs. until further directed by your surgeon. Avoid periods of inactivity such as sitting longer than an hour when not asleep. This helps prevent blood clots.  You may return to work once you are authorized by your doctor.     WEIGHT BEARING   Weight bearing as tolerated with assist device (walker, cane, etc) as directed, use it as long as suggested by your surgeon or therapist, typically at least 4-6 weeks.   EXERCISES  Results after joint replacement surgery are often greatly improved when you follow the exercise, range of motion and muscle strengthening exercises prescribed by your doctor. Safety measures are also important to protect the joint from further injury. Any time any of these exercises cause you to have increased pain or swelling, decrease what you are doing until you are comfortable again and then slowly increase them. If you have problems or questions, call your caregiver or physical therapist for advice.   Rehabilitation is important following a joint replacement. After just a few days of immobilization, the muscles of the leg can become weakened and shrink (atrophy).  These exercises are designed to build up the tone and strength of the thigh and leg muscles and to improve motion. Often times heat used for twenty to thirty minutes before working out will loosen up your tissues and help with improving the range of motion but do not use heat for the first two weeks following  surgery (sometimes heat can increase post-operative swelling).   These exercises can be done on a training (exercise) mat, on the floor, on a table or on a bed. Use whatever works the best and is most comfortable for you.    Use music or television while you are exercising so that the exercises are a pleasant break in your day. This will make your life better with the exercises acting as a break in your routine that you can look forward to.   Perform all exercises about fifteen times, three times per day or as directed.  You should exercise both the operative leg and the other leg as well.  Exercises include:   Quad Sets - Tighten up the muscle on the front of the thigh [employer]) and hold for  5-10 seconds.   Straight Leg Raises - With your knee straight (if you were given a brace, keep it on), lift the leg to 60 degrees, hold for 3 seconds, and slowly lower the leg.  Perform this exercise against resistance later as your leg gets stronger.  Leg Slides: Lying on your back, slowly slide your foot toward your buttocks, bending your knee up off the floor (only go as far as is comfortable). Then slowly slide your foot back down until your leg is flat on the floor again.  Angel Wings: Lying on your back spread your legs to the side as far apart as you can without causing discomfort.  Hamstring Strength:  Lying on your back, push your heel against the floor with your leg straight by tightening up the muscles of your buttocks.  Repeat, but this time bend your knee to a comfortable angle, and push your heel against the floor.  You may put a pillow under the heel to make it more comfortable if necessary.   A rehabilitation program following joint replacement surgery can speed recovery and prevent re-injury in the future due to weakened muscles. Contact your doctor or a physical therapist for more information on knee rehabilitation.    CONSTIPATION  Constipation is defined medically as fewer than three stools per  week and severe constipation as less than one stool per week.  Even if you have a regular bowel pattern at home, your normal regimen is likely to be disrupted due to multiple reasons following surgery.  Combination of anesthesia, postoperative narcotics, change in appetite and fluid intake all can affect your bowels.   YOU MUST use at least one of the following options; they are listed in order of increasing strength to get the job done.  They are all available over the counter, and you may need to use some, POSSIBLY even all of these options:    Drink plenty of fluids (prune juice may be helpful) and high fiber foods Colace 100 mg by mouth twice a day  Senokot for constipation as directed and as needed Dulcolax (bisacodyl), take with full glass of water  Miralax (polyethylene glycol) once or twice a day as needed.  If you have tried all these things and are unable to have a bowel movement in the first 3-4 days after surgery call either your surgeon or your primary doctor.    If you experience loose stools or diarrhea, hold the medications until you stool forms back up.  If your symptoms do not get better within 1 week or if they get worse, check with your doctor.  If you experience "the worst abdominal pain ever" or develop nausea or vomiting, please contact the office immediately for further recommendations for treatment.   ITCHING:  If you experience itching with your medications, try taking only a single pain pill, or even half a pain pill at a time.  You can also use Benadryl over the counter for itching or also to help with sleep.   TED HOSE STOCKINGS:  Use stockings on both legs until for at least 2 weeks or as directed by physician office. They may be removed at night for sleeping.  MEDICATIONS:  See your medication summary on the "After Visit Summary" that nursing will review with you.  You may have some home medications which will be placed on hold until you complete the course of blood  thinner medication.  It is important for you to complete the blood thinner medication as  prescribed.  PRECAUTIONS:  If you experience chest pain or shortness of breath - call 911 immediately for transfer to the hospital emergency department.   If you develop a fever greater that 101 F, purulent drainage from wound, increased redness or drainage from wound, foul odor from the wound/dressing, or calf pain - CONTACT YOUR SURGEON.                                                   FOLLOW-UP APPOINTMENTS:  If you do not already have a post-op appointment, please call the office for an appointment to be seen by your surgeon.  Guidelines for how soon to be seen are listed in your "After Visit Summary", but are typically between 1-4 weeks after surgery.  OTHER INSTRUCTIONS:   Knee Replacement:  Do not place pillow under knee, focus on keeping the knee straight while resting. CPM instructions: 0-90 degrees, 2 hours in the morning, 2 hours in the afternoon, and 2 hours in the evening. Place foam block, curve side up under heel at all times except when in CPM or when walking.  DO NOT modify, tear, cut, or change the foam block in any way.   DENTAL ANTIBIOTICS:  In most cases prophylactic antibiotics for Dental procdeures after total joint surgery are not necessary.  Exceptions are as follows:  1. History of prior total joint infection  2. Severely immunocompromised (Organ Transplant, cancer chemotherapy, Rheumatoid biologic meds such as [place])  3. Poorly controlled diabetes (A1C &gt; 8.0, blood glucose over 200)  If you have one of these conditions, contact your surgeon for an antibiotic prescription, prior to your dental procedure.   MAKE SURE YOU:  Understand these instructions.  Get help right away if you are not doing well or get worse.    Thank you for letting us be a part of your medical care team.  It is a privilege we respect greatly.  We hope these instructions will help you stay on  track for a fast and full recovery!   Increase activity slowly as tolerated   Complete by: As directed    Increase activity slowly as tolerated   Complete by: As directed        Follow-up Information    Melrose Nakayama, MD. Schedule an appointment as soon as possible for a visit in 2 weeks.   Specialty: Orthopedic Surgery Contact information: [place] [address] [phone]                Signed: Larwance Sachs Kalif Kattner 05/31/2020, 8:19 AM

## 2020-06-01 ENCOUNTER — Encounter (HOSPITAL_COMMUNITY): Payer: Self-pay | Admitting: Orthopaedic Surgery

## 2020-06-01 NOTE — Anesthesia Postprocedure Evaluation (Signed)
Anesthesia Post Note  Patient: Patricia Villegas  Procedure(s) Performed: RIGHT TOTAL HIP ARTHROPLASTY ANTERIOR APPROACH (Right Hip)     Patient location during evaluation: PACU Anesthesia Type: MAC Level of consciousness: awake and alert Pain management: pain level controlled Vital Signs Assessment: post-procedure vital signs reviewed and stable Respiratory status: spontaneous breathing Cardiovascular status: stable Anesthetic complications: no   No complications documented.  Last Vitals:  Vitals:   05/31/20 0603 05/31/20 0924  BP: (!) 116/56 (!) 104/38  Pulse: 63 65  Resp: 18   Temp: 36.8 C 36.7 C  SpO2: 100% 100%    Last Pain:  Vitals:   05/31/20 0924  TempSrc: Oral  PainSc:                  Nolon Nations

## 2020-06-07 ENCOUNTER — Telehealth: Payer: Self-pay | Admitting: Interventional Cardiology

## 2020-06-07 NOTE — Telephone Encounter (Addendum)
Pt had hip replacement on 9/21.  On 9/23 had PT eval and after exerting, suddenly passed out.  BP while exerting was 180/90.  PT had just left the house when pt suddenly had episode.  Right before going down she felt dizzy.  Has had a couple of dizzy spells since then but not to where she felt like she would pass out again.  Did not check BP again after waking up.  Also complains of swelling in bilateral feet, ankles and legs up to the knee.  Spoke with Dr. Osborne Casco today and he instructed her to increase Lasix to 40mg  QD and contact our office for an appt.  Pt does have pain in both legs that is worse when she wears her compression stockings.  Also said legs are numb and cold.  Big toe on one foot is turning red.  Scheduled pt to see Truitt Merle, NP next week.  Advised pt to speak with surgeon's office as well to see if they want to order a doppler to check for DVT.  Pt states she is seeing them on Friday and will discuss then.  Advised would be better to go ahead and call.  Will route to Dr. Tamala Julian to make him aware and advised I will call back if he has further recommendations in the meantime.

## 2020-06-07 NOTE — Progress Notes (Signed)
CARDIOLOGY OFFICE NOTE  Date:  06/14/2020    Patricia Villegas Date of Birth: 12/09/1934 Medical Record #[id number]  PCP:  Haywood Pao, MD  Cardiologist:  Tamala Julian   Chief Complaint  Patient presents with  . Follow-up    Work in visit - seen for Dr. Tamala Julian    History of Present Illness: Patricia Villegas is a 85 y.o. female who presents today for a work in visit.  Seen for Dr. Tamala Julian.   She has a history of DM II, HTN, [name], chronic diastolic HF and known CAD with remote CABG in 2014.   Last seen by Dr. Tamala Julian in March - felt to be doing ok.   Phone call last week - "Pt had hip replacement on 9/21.  On 9/23 had PT eval and after exerting, suddenly passed out.  BP while exerting was 180/90.  Pt has just left the house when pt suddenly had episode.  Right before going down she felt dizzy.  Has had a couple of dizzy spells since then but not to where she felt like she would pass out again.  Did not check BP again after waking up.  Also complains of swelling in bilateral feet, ankles and legs up to the knee.  Spoke with Dr. Osborne Casco today and he instructed her to increase Lasix to 40mg  QD and contact our office for an appt.  Pt does have pain in both legs that is worse when she wears her compression stockings.  Also said legs are numb and cold.  Big toe on one foot is turning red.  Scheduled pt to see Truitt Merle, NP next week.  Advised pt to speak with surgeon's office as well to see if they want to order a doppler to check for DVT.  Pt states she is seeing them on Friday and will discuss then.  Advised would be better to go ahead and call.  Will route to Dr. Tamala Julian to make him aware and advised I will call back if he has further recommendations in the meantime."   Then to the ER last week with shortness of breath. Has remained anemic. HGB was down to 7.3. She was to follow up with her PCP. CT negative for PE. No DVT on doppler study.   Thus added to my schedule for today.     Comes in  today. Here with her son, Patricia Villegas. She feels very weak. She just came from the orthopedic office - had her dressing changed and was felt to be ok. She is pretty upset that she has not been transfused yet and is asking for help. She says she is just getting weaker and weaker. He notes that she really was not doing well prior to the hip surgery. She could not walk due to the pain. Now she is "just so weak". She feels short of breath - this is getting progressive. More swelling in her lower extremities as well. The spell that she had last week - she was in her chair - had not been feeling well - did not really get very much PT due to feeling so bad and her family just found her - "thought I was dead". This has fortunately not recurred. She does not really remember much of this. She is quite clear that she will not go back to the ER.   Past Medical History:  Diagnosis Date  . Anemia   . Anxiety   . Arthritis   . Blood transfusion   .  Chest pain    a. Lexiscan Myoview 12/12:  EF 75%, no ischemia or scar;  b.  Echo 12/12:  EF 60-65%, no sig valvular abnormalities   . Closed left fibular fracture    Casted   . Complication of anesthesia 1998   Unable to arouse fully, respiratory depression, pt. reports that the family was told that she would need a trach., but then she stabilized & didn't need it   . Coronary artery disease    sees Dr Aundra Dubin every 6 months  . Depression   . Diabetes mellitus    Greater than 20 years type 2   . GERD (gastroesophageal reflux disease)   . H/O hiatal hernia   . Hypercholesteremia   . Hypertension    Greater than 20 years  . Lumbar disc disease    S/p surgery x4;  Sciatica   . Neuropathy due to secondary diabetes ([facility])    Hx: of  . Parastomal hernia of ileal conduit 2009  . Pneumonia    s/p CABG  . [name] (peripheral vascular disease) ([facility])    Stable claudication. Arteriogram 07/2009 no significant aortoiliac disease, has bilateral SFA occlusions with distal  reconstitution   . Rheumatoid arthritis ([facility])    Dr Berna Bue @ [facility]  . Transfusion reaction 1960   Convulsions (? seizure)  . UTI (lower urinary tract infection) 01/03/15    Past Surgical History:  Procedure Laterality Date  . ABDOMINAL HYSTERECTOMY    . BACK SURGERY     4 back operations most recently 1995, 1998  . CARDIAC CATHETERIZATION     Hx: of 05/14/13  . CATARACT EXTRACTION W/ INTRAOCULAR LENS  IMPLANT, BILATERAL     Hx: of  . COLONOSCOPY     Hx; of  . CORONARY ARTERY BYPASS GRAFT N/A 05/24/2013   Procedure: CORONARY ARTERY BYPASS GRAFTING (CABG);  Surgeon: Melrose Nakayama, MD;  Location: [facility];  Service: Open Heart Surgery;  Laterality: N/A;  . DILATION AND CURETTAGE OF UTERUS    . HERNIA REPAIR  2009  . LEFT HEART CATHETERIZATION WITH CORONARY ANGIOGRAM N/A 05/14/2013   Procedure: LEFT HEART CATHETERIZATION WITH CORONARY ANGIOGRAM;  Surgeon: Larey Dresser, MD;  Location: [facility] C[facility] LAB;  Service: Cardiovascular;  Laterality: N/A;  . TONSILLECTOMY    . TOTAL HIP ARTHROPLASTY Left 01/10/2015   Procedure: TOTAL HIP ARTHROPLASTY ANTERIOR APPROACH;  Surgeon: Melrose Nakayama, MD;  Location: [facility];  Service: Orthopedics;  Laterality: Left;  . TOTAL HIP ARTHROPLASTY Right 05/30/2020   Procedure: RIGHT TOTAL HIP ARTHROPLASTY ANTERIOR APPROACH;  Surgeon: Melrose Nakayama, MD;  Location: [facility];  Service: Orthopedics;  Laterality: Right;     Medications: Current Meds  Medication Sig  . allopurinol (ZYLOPRIM) 100 MG tablet Take 200 mg by mouth daily.   Marland Kitchen aspirin EC 81 MG tablet Take 1 tablet (81 mg total) by mouth 2 (two) times daily after a meal.  . benazepril (LOTENSIN) 20 MG tablet Take 20 mg by mouth daily.  . folic acid (FOLVITE) 1 MG tablet Take 2 mg by mouth daily.  . furosemide (LASIX) 20 MG tablet Take 20 mg by mouth daily.   Marland Kitchen HYDROcodone-acetaminophen (NORCO/VICODIN) 5-325 MG tablet Take 1-2 tablets by mouth every 6 (six) hours as needed for moderate pain  or severe pain.  . hydroxychloroquine (PLAQUENIL) 200 MG tablet Take 200 mg by mouth 2 (two) times daily.  Marland Kitchen LANTUS SOLOSTAR 100 UNIT/ML Solostar Pen Inject 10 Units into the skin daily. Patient reports  she takes at 2pm daily per pt  . metoprolol tartrate (LOPRESSOR) 25 MG tablet Take 0.5 tablets (12.5 mg total) by mouth 2 (two) times daily.  . ONE TOUCH ULTRA TEST test strip   . predniSONE (DELTASONE) 5 MG tablet Take 5 mg by mouth daily with breakfast.   . senna-docusate (SENOKOT-S) 8.6-50 MG tablet Take 1 tablet by mouth daily as needed for mild constipation.  . simvastatin (ZOCOR) 80 MG tablet Take 80 mg by mouth at bedtime.    Marland Kitchen spironolactone (ALDACTONE) 25 MG tablet Take 0.5 tablets (12.5 mg total) by mouth daily.  Marland Kitchen tiZANidine (ZANAFLEX) 2 MG tablet Take 1-2 tablets (2-4 mg total) by mouth every 6 (six) hours as needed for muscle spasms.     Allergies: No Known Allergies  Social History: The patient  reports that she quit smoking about 23 years ago. Her smoking use included cigarettes. She has a 5.00 pack-year smoking history. Her smokeless tobacco use includes chew. She reports that she does not drink alcohol and does not use drugs.   Family History: The patient's family history includes Cancer in her brother and sister; Coronary artery disease (age of onset: 39) in her mother; Diabetes in her brother and mother; Heart attack in her mother; Heart disease in her brother; Kidney disease in her brother; Stroke in her brother and mother.   Review of Systems: Please see the history of present illness.   All other systems are reviewed and negative.   Physical Exam: VS:  BP (!) 98/50   Pulse 80  .  BMI There is no height or weight on file to calculate BMI.   She was not able to stand - did not feel steady.   Wt Readings from Last 3 Encounters:  06/08/20 138 lb 14.2 oz (63 kg)  05/30/20 139 lb 3.2 oz (63.1 kg)  11/29/19 147 lb (66.7 kg)    General: Alert. She looks weak but in  no acute distress.  She has had weight loss noted. She is rather pale.  Cardiac: Regular rate and rhythm. No murmurs, rubs, or gallops. 1+ bilateral edema.  Respiratory:  Lungs are clear to auscultation bilaterally with normal work of breathing.  GI: Soft and nontender.  MS: No deformity or atrophy. Gait not tested.  Skin: Warm and dry. Color is pale.  Neuro:  Strength and sensation are intact and no gross focal deficits noted.  Psych: Alert, appropriate and with normal affect.   LABORATORY DATA:  EKG:  EKG is not ordered today.    Lab Results  Component Value Date   WBC 11.2 (H) 06/08/2020   HGB 7.3 (L) 06/09/2020   HCT 22.8 (L) 06/09/2020   PLT 292 06/08/2020   GLUCOSE 141 (H) 06/08/2020   CHOL 166 05/13/2014   TRIG 140.0 05/13/2014   HDL 52.50 05/13/2014   LDLCALC 86 05/13/2014   ALT 21 06/08/2020   AST 34 06/08/2020   NA 135 06/08/2020   K 4.3 06/08/2020   CL 104 06/08/2020   CREATININE 1.29 (H) 06/08/2020   BUN 22 06/08/2020   CO2 22 06/08/2020   INR 1.0 05/24/2020   HGBA1C 6.5 (H) 05/24/2020       BNP (last 3 results) Recent Labs    06/08/20 1630  BNP 177.6*    ProBNP (last 3 results) Recent Labs    07/19/19 1607 08/31/19 1052  PROBNP 1,614* 980*     Other Studies Reviewed Today:  ECHO IMPRESSIONS 07/2019  1. Left ventricular ejection  fraction, by visual estimation, is 60 to  65%. The left ventricle has normal function. There is no left ventricular  hypertrophy.  2. Elevated left ventricular end-diastolic pressure.  3. Left ventricular diastolic parameters are consistent with Grade II  diastolic dysfunction (pseudonormalization).  4. Global right ventricle has normal systolic function.The right  ventricular size is normal. No increase in right ventricular wall  thickness.  5. Left atrial size was mildly dilated.  6. Right atrial size was normal.  7. The mitral valve is normal in structure. Mild mitral valve  regurgitation. No  evidence of mitral stenosis.  8. The tricuspid valve is normal in structure. Tricuspid valve  regurgitation is mild.  9. The aortic valve is tricuspid. Aortic valve regurgitation is trivial.  Mild to moderate aortic valve sclerosis/calcification without any evidence  of aortic stenosis.  10. The pulmonic valve was normal in structure. Pulmonic valve  regurgitation is not visualized.  11. There is mild dilatation of the ascending aorta measuring 42 mm.  12. Moderately elevated pulmonary artery systolic pressure at [id number].  13. The inferior vena cava is normal in size with greater than 50%  respiratory variability, suggesting right atrial pressure of 3 mmHg.  14. The average left ventricular global longitudinal strain is -20.7 %.     ASSESSMENT & PLAN:    1. Shortness of breath/syncope/generalized weakness - this is felt to be due to acute blood loss anemia that has progressed - not able to transfuse as outpatient until tomorrow - I do not think that is the safest option for her and would favor admission instead. Have discussed her case with Dr. Tamala Julian here in the office as well who is in agreement. Would hold her antihypertensives for now. Cut aspirin back to QD. Transfuse and probably needs IV lasix to follow.   We can see her back after discharge - reassess her cardiac status and make sure no further testing from our standpoint is needed. Appointment has been made.   2. Recent hip replacement with presumed blood loss anemia - seen by ortho earlier today - apparently had a good report from him.   3. CAD - some vague chest pain - I suspect this is due from her anemia - she will need labs. EKG from the weekend stable.   4. Acute on chronic diastolic HF - most likely exacerbated by her anemia. See above.   5. HTN - BP soft - would favor holding medicines as needed.   6. [name] - not discussed  7. DM - not discussed.   Current medicines are reviewed with the patient today.  The patient  does not have concerns regarding medicines other than what has been noted above.  The following changes have been made:  See above.  Labs/ tests ordered today include:   No orders of the defined types were placed in this encounter.    Disposition:   FU with me in about 10 days. She was taken by car to [facility] - she has a bed on 6E. Dr. Letta Median with the hospitalist service has agreed to admit. We will be available as needed. 60 minutes was spent providing/coordinating her care.   Patient is agreeable to this plan and will call if any problems develop in the interim.   SignedTruitt Merle, NP  06/14/2020 12:06 PM  [facility] [address] [place], [place]  [address] Phone: [phone] Fax: [phone]

## 2020-06-07 NOTE — Telephone Encounter (Signed)
New message:    Patient had a hip replacement on 05/30/20 and that Thursday patient passed out. Patient primary doctor told her to call the doctor office and patient is having swelling in her feet and legs. Please call patient.

## 2020-06-08 ENCOUNTER — Encounter (HOSPITAL_COMMUNITY): Payer: Self-pay | Admitting: Emergency Medicine

## 2020-06-08 ENCOUNTER — Other Ambulatory Visit: Payer: Self-pay

## 2020-06-08 ENCOUNTER — Emergency Department (HOSPITAL_COMMUNITY): Payer: Medicare Other

## 2020-06-08 ENCOUNTER — Emergency Department (HOSPITAL_COMMUNITY)
Admission: EM | Admit: 2020-06-08 | Discharge: 2020-06-09 | Disposition: A | Discharge status: Home / Self Care | Payer: Medicare Other | Attending: Emergency Medicine | Admitting: Emergency Medicine

## 2020-06-08 DIAGNOSIS — I2510 Atherosclerotic heart disease of native coronary artery without angina pectoris: Secondary | ICD-10-CM | POA: Insufficient documentation

## 2020-06-08 DIAGNOSIS — Z7982 Long term (current) use of aspirin: Secondary | ICD-10-CM | POA: Insufficient documentation

## 2020-06-08 DIAGNOSIS — Z951 Presence of aortocoronary bypass graft: Secondary | ICD-10-CM | POA: Insufficient documentation

## 2020-06-08 DIAGNOSIS — R0602 Shortness of breath: Secondary | ICD-10-CM | POA: Diagnosis present

## 2020-06-08 DIAGNOSIS — I5032 Chronic diastolic (congestive) heart failure: Secondary | ICD-10-CM | POA: Diagnosis not present

## 2020-06-08 DIAGNOSIS — R600 Localized edema: Secondary | ICD-10-CM | POA: Insufficient documentation

## 2020-06-08 DIAGNOSIS — I110 Hypertensive heart disease with heart failure: Secondary | ICD-10-CM | POA: Diagnosis not present

## 2020-06-08 DIAGNOSIS — Z87891 Personal history of nicotine dependence: Secondary | ICD-10-CM | POA: Insufficient documentation

## 2020-06-08 DIAGNOSIS — Z79899 Other long term (current) drug therapy: Secondary | ICD-10-CM | POA: Insufficient documentation

## 2020-06-08 DIAGNOSIS — R791 Abnormal coagulation profile: Secondary | ICD-10-CM | POA: Diagnosis not present

## 2020-06-08 DIAGNOSIS — Z96643 Presence of artificial hip joint, bilateral: Secondary | ICD-10-CM | POA: Diagnosis not present

## 2020-06-08 DIAGNOSIS — R7989 Other specified abnormal findings of blood chemistry: Secondary | ICD-10-CM

## 2020-06-08 DIAGNOSIS — E119 Type 2 diabetes mellitus without complications: Secondary | ICD-10-CM | POA: Insufficient documentation

## 2020-06-08 DIAGNOSIS — D649 Anemia, unspecified: Secondary | ICD-10-CM | POA: Diagnosis not present

## 2020-06-08 LAB — D-DIMER, QUANTITATIVE: D-Dimer, Quant: 7.66 ug/mL-FEU — ABNORMAL HIGH (ref 0.00–0.50)

## 2020-06-08 LAB — TYPE AND SCREEN
ABO/RH(D): A POS
Antibody Screen: NEGATIVE

## 2020-06-08 LAB — BRAIN NATRIURETIC PEPTIDE: B Natriuretic Peptide: 177.6 pg/mL — ABNORMAL HIGH (ref 0.0–100.0)

## 2020-06-08 LAB — COMPREHENSIVE METABOLIC PANEL
ALT: 21 U/L (ref 0–44)
AST: 34 U/L (ref 15–41)
Albumin: 3.1 g/dL — ABNORMAL LOW (ref 3.5–5.0)
Alkaline Phosphatase: 48 U/L (ref 38–126)
Anion gap: 9 (ref 5–15)
BUN: 22 mg/dL (ref 8–23)
CO2: 22 mmol/L (ref 22–32)
Calcium: 8.8 mg/dL — ABNORMAL LOW (ref 8.9–10.3)
Chloride: 104 mmol/L (ref 98–111)
Creatinine, Ser: 1.29 mg/dL — ABNORMAL HIGH (ref 0.44–1.00)
GFR calc Af Amer: 44 mL/min — ABNORMAL LOW (ref >60)
GFR calc non Af Amer: 38 mL/min — ABNORMAL LOW (ref >60)
Glucose, Bld: 141 mg/dL — ABNORMAL HIGH (ref 70–99)
Potassium: 4.3 mmol/L (ref 3.5–5.1)
Sodium: 135 mmol/L (ref 135–145)
Total Bilirubin: 1 mg/dL (ref 0.3–1.2)
Total Protein: 5.9 g/dL — ABNORMAL LOW (ref 6.5–8.1)

## 2020-06-08 LAB — CBC WITH DIFFERENTIAL/PLATELET
Abs Immature Granulocytes: 0.17 10*3/uL — ABNORMAL HIGH (ref 0.00–0.07)
Basophils Absolute: 0 10*3/uL (ref 0.0–0.1)
Basophils Relative: 0 %
Eosinophils Absolute: 0 10*3/uL (ref 0.0–0.5)
Eosinophils Relative: 0 %
HCT: 23.3 % — ABNORMAL LOW (ref 36.0–46.0)
Hemoglobin: 7.7 g/dL — ABNORMAL LOW (ref 12.0–15.0)
Immature Granulocytes: 2 %
Lymphocytes Relative: 5 %
Lymphs Abs: 0.5 10*3/uL — ABNORMAL LOW (ref 0.7–4.0)
MCH: 34.7 pg — ABNORMAL HIGH (ref 26.0–34.0)
MCHC: 33 g/dL (ref 30.0–36.0)
MCV: 105 fL — ABNORMAL HIGH (ref 80.0–100.0)
Monocytes Absolute: 0.2 10*3/uL (ref 0.1–1.0)
Monocytes Relative: 2 %
Neutro Abs: 10.2 10*3/uL — ABNORMAL HIGH (ref 1.7–7.7)
Neutrophils Relative %: 91 %
Platelets: 292 10*3/uL (ref 150–400)
RBC: 2.22 MIL/uL — ABNORMAL LOW (ref 3.87–5.11)
RDW: 18 % — ABNORMAL HIGH (ref 11.5–15.5)
WBC: 11.2 10*3/uL — ABNORMAL HIGH (ref 4.0–10.5)
nRBC: 0 % (ref 0.0–0.2)

## 2020-06-08 LAB — TROPONIN I (HIGH SENSITIVITY): Troponin I (High Sensitivity): 18 ng/L — ABNORMAL HIGH (ref <18)

## 2020-06-08 NOTE — Telephone Encounter (Signed)
Spoke with son and made him aware that I will keep appt with Patricia Villegas next week incase pt needs close f/u.  Advised if they do not feel she needs the appt when she is being released, just contact the office and we can move it out. Son appreciative for call.

## 2020-06-08 NOTE — Telephone Encounter (Signed)
   Effie pt's step-daughter calling, she said pt's swelling and BP got worst and EMS is taking her now to [facility]. She said she wanted to let Dr. Tamala Julian know, she also said if Dr. Tamala Julian or his nurse needs to callback to call pt's son [name].

## 2020-06-08 NOTE — ED Triage Notes (Signed)
Pt brought to ED by EMS from home for c/o increase SOB and bilateral leg edema. Pt had hip surgery 9 days ago and is getting increase of SOB. PCP increase Lasix 20 up to 40 with no relief. BP sitting 120/73 standing 95/60 for EMS. BP on arrival 135/84, HR 94, R16, SPO2 98% RA.

## 2020-06-08 NOTE — ED Provider Notes (Signed)
[facility] EMERGENCY DEPARTMENT Provider Note   CSN: [id number] Arrival date & time: 06/08/20  1607     History Chief Complaint  Patient presents with  . Shortness of Breath    Patricia Villegas is a 85 y.o. female.  HPI     85yo female with history of peripheral vascular disease, coronary artery disease s/p CABG, RA, diabetes, hypertension, hyperlipidemia, history of possible seizure with blood transfusion in 1960, recent right hip arthroplasty September 21 who presents with concern for progressive shortness of breath.  Reports that she was having some dyspnea prior to her surgery, but since the surgery she has had gradual worsening of her symptoms. Presents with concern for shortness of breath Shortness of breath for one month, chair to bathroom feels severe dyspnea-very short distance, since the surgery Was not using walker before the surgery but was limited to the amount she could walk because of dyspnea on exertion, had tests done last December on heart and things were ok then-saw Dr. Tamala Julian 9 days ago had hip surgery Therapist evaluated Increasing swelling of legs over last few weeks Sleeps with 2 pillows since the bypass surgery Lightheadedness for a month before the surgery Chest pain more often than it had been, since the first of August with hip pain. Aching pain with exertion and shortness of breath, not having resting chest pain.  On Aspirin  Has been constipated, taking laxatives, miralax-no black or bloody stools-has had 5 days since last stool  Past Medical History:  Diagnosis Date  . Anemia   . Anxiety   . Arthritis   . Blood transfusion   . Chest pain    a. Lexiscan Myoview 12/12:  EF 75%, no ischemia or scar;  b.  Echo 12/12:  EF 60-65%, no sig valvular abnormalities   . Closed left fibular fracture    Casted   . Complication of anesthesia 1998   Unable to arouse fully, respiratory depression, pt. reports that the family was told that  she would need a trach., but then she stabilized & didn't need it   . Coronary artery disease    sees Dr Aundra Dubin every 6 months  . Depression   . Diabetes mellitus    Greater than 20 years type 2   . GERD (gastroesophageal reflux disease)   . H/O hiatal hernia   . Hypercholesteremia   . Hypertension    Greater than 20 years  . Lumbar disc disease    S/p surgery x4;  Sciatica   . Neuropathy due to secondary diabetes ([place])    Hx: of  . Parastomal hernia of ileal conduit ([place]) 2009  . Pneumonia    s/p CABG  . [name] (peripheral vascular disease) ([facility])    Stable claudication. Arteriogram 07/2009 no significant aortoiliac disease, has bilateral SFA occlusions with distal reconstitution   . Rheumatoid arthritis ([place])    Dr Berna Bue @ [facility]  . Transfusion reaction 1960   Convulsions (? seizure)  . UTI (lower urinary tract infection) 01/03/15    Patient Active Problem List   Diagnosis Date Noted  . Primary localized osteoarthritis of right hip 05/30/2020  . S/P total hip arthroplasty 05/30/2020  . Primary osteoarthritis of right hip 05/30/2020  . Carotid bruit 03/21/2016  . Primary osteoarthritis of left hip 01/10/2015  . Chronic diastolic CHF (congestive heart failure) ([employer]) 06/30/2013  . CAD (coronary artery disease) of artery bypass graft 05/18/2013  . Femoral artery occlusion, bilateral 05/18/2013  . Diabetes  1.5, managed as type 2 ([place]) 05/18/2013  . Rheumatoid arthritis ([place]) 05/18/2013  . Chest pain 09/18/2011  . Peripheral vascular disease ([place]) 09/18/2011  . Unstable angina ([place]) 08/23/2011  . Hypertension 08/23/2011  . Hyperlipidemia 08/23/2011    Past Surgical History:  Procedure Laterality Date  . ABDOMINAL HYSTERECTOMY    . BACK SURGERY     4 back operations most recently 1995, 1998  . CARDIAC CATHETERIZATION     Hx: of 05/14/13  . CATARACT EXTRACTION W/ INTRAOCULAR LENS  IMPLANT, BILATERAL     Hx: of  . COLONOSCOPY     Hx; of  . CORONARY ARTERY  BYPASS GRAFT N/A 05/24/2013   Procedure: CORONARY ARTERY BYPASS GRAFTING (CABG);  Surgeon: Melrose Nakayama, MD;  Location: [facility];  Service: Open Heart Surgery;  Laterality: N/A;  . DILATION AND CURETTAGE OF UTERUS    . HERNIA REPAIR  2009  . LEFT HEART CATHETERIZATION WITH CORONARY ANGIOGRAM N/A 05/14/2013   Procedure: LEFT HEART CATHETERIZATION WITH CORONARY ANGIOGRAM;  Surgeon: Larey Dresser, MD;  Location[facility] CATH LAB;  Service: Cardiovascular;  Laterality: N/A;  . TONSILLECTOMY    . TOTAL HIP ARTHROPLASTY Left 01/10/2015   Procedure: TOTAL HIP ARTHROPLASTY ANTERIOR APPROACH;  Surgeon: Melrose Nakayama, MD;  Location: [facility];  Service: Orthopedics;  Laterality: Left;  . TOTAL HIP ARTHROPLASTY Right 05/30/2020   Procedure: RIGHT TOTAL HIP ARTHROPLASTY ANTERIOR APPROACH;  Surgeon: Melrose Nakayama, MD;  Location: [facility];  Service: Orthopedics;  Laterality: Right;     OB History   No obstetric history on file.     Family History  Problem Relation Age of Onset  . Coronary artery disease Mother 14  . Diabetes Mother   . Stroke Mother   . Heart attack Mother   . Cancer Brother   . Heart disease Brother   . Stroke Brother   . Kidney disease Brother   . Cancer Sister   . Diabetes Brother     Social History   Tobacco Use  . Smoking status: Former Smoker    Packs/day: 0.50    Years: 10.00    Pack years: 5.00    Types: Cigarettes    Quit date: 09/09/1996    Years since quitting: 23.7  . Smokeless tobacco: Current User    Types: Chew  Vaping Use  . Vaping Use: Never used  Substance Use Topics  . Alcohol use: No  . Drug use: No    Home Medications Prior to Admission medications   Medication Sig Start Date End Date Taking? Authorizing Provider  allopurinol (ZYLOPRIM) 100 MG tablet Take 200 mg by mouth daily.  10/26/19   [provider]  aspirin EC 81 MG tablet Take 1 tablet (81 mg total) by mouth 2 (two) times daily after a meal. 05/30/20   Loni Dolly, PA-C    benazepril (LOTENSIN) 20 MG tablet Take 20 mg by mouth daily. 08/02/13   Larey Dresser, MD  folic acid (FOLVITE) 1 MG tablet Take 2 mg by mouth daily. 05/10/20   [provider]  furosemide (LASIX) 20 MG tablet Take 20 mg by mouth daily.     [provider]  HYDROcodone-acetaminophen (NORCO/VICODIN) 5-325 MG tablet Take 1-2 tablets by mouth every 6 (six) hours as needed for moderate pain or severe pain. 05/30/20   Loni Dolly, PA-C  hydroxychloroquine (PLAQUENIL) 200 MG tablet Take 200 mg by mouth 2 (two) times daily. 10/26/19   [provider]  LANTUS SOLOSTAR 100  UNIT/ML Solostar Pen Inject 10 Units into the skin daily. Patient reports she takes at 2pm daily per pt 09/01/19   [provider]  metoprolol tartrate (LOPRESSOR) 25 MG tablet Take 0.5 tablets (12.5 mg total) by mouth 2 (two) times daily. 07/19/19   Darreld [name], PA-C  ONE TOUCH ULTRA TEST test strip  06/24/13   [provider]  predniSONE (DELTASONE) 5 MG tablet Take 5 mg by mouth daily with breakfast.     [provider]  senna-docusate (SENOKOT-S) 8.6-50 MG tablet Take 1 tablet by mouth daily as needed for mild constipation.    [provider]  simvastatin (ZOCOR) 80 MG tablet Take 80 mg by mouth at bedtime.      [provider]  spironolactone (ALDACTONE) 25 MG tablet Take 0.5 tablets (12.5 mg total) by mouth daily. 07/28/19   Belva Crome, MD  tiZANidine (ZANAFLEX) 2 MG tablet Take 1-2 tablets (2-4 mg total) by mouth every 6 (six) hours as needed for muscle spasms. 05/30/20 05/30/21  Loni Dolly, PA-C    Allergies    Patient has no known allergies.  Review of Systems   Review of Systems  Constitutional: Positive for fatigue. Negative for fever.  HENT: Negative for sore throat.   Eyes: Negative for visual disturbance.  Respiratory: Positive for shortness of breath. Negative for cough.   Cardiovascular: Positive for chest pain and leg swelling.   Gastrointestinal: Positive for constipation. Negative for abdominal pain, anal bleeding (did have small amount wiht large hard stool previously (no BM now for 5 days) ), blood in stool, nausea and vomiting.  Genitourinary: Negative for difficulty urinating.  Musculoskeletal: Negative for back pain and neck pain.  Skin: Negative for rash.  Neurological: Positive for light-headedness. Negative for headaches.    Physical Exam Updated Vital Signs BP (!) 123/53   Pulse 61   Temp 98.4 F (36.9 C) (Oral)   Resp 16   Ht 5\' 6"  (1.676 m)   Wt 63 kg   SpO2 98%   BMI 22.42 kg/m   Physical Exam Vitals and nursing note reviewed.  Constitutional:      General: She is not in acute distress.    Appearance: She is well-developed. She is not diaphoretic.  HENT:     Head: Normocephalic and atraumatic.  Eyes:     Conjunctiva/sclera: Conjunctivae normal.  Cardiovascular:     Rate and Rhythm: Normal rate and regular rhythm.     Heart sounds: Normal heart sounds. No murmur heard.  No friction rub. No gallop.   Pulmonary:     Effort: Pulmonary effort is normal. No respiratory distress.     Breath sounds: Normal breath sounds. No wheezing or rales.  Abdominal:     General: There is no distension.     Palpations: Abdomen is soft.     Tenderness: There is no abdominal tenderness. There is no guarding.  Musculoskeletal:        General: No tenderness.     Cervical back: Normal range of motion.     Right lower leg: Edema (2+) present.     Left lower leg: Left lower leg edema: trace.  Skin:    General: Skin is warm and dry.     Findings: No erythema or rash.     Comments: Incision right hip C/D/I  Neurological:     Mental Status: She is alert and oriented to person, place, and time.     ED Results / Procedures / Treatments  Labs (all labs ordered are listed, but only abnormal results are displayed) Labs Reviewed  CBC WITH DIFFERENTIAL/PLATELET - Abnormal; Notable for the following  components:      Result Value   WBC 11.2 (*)    RBC 2.22 (*)    Hemoglobin 7.7 (*)    HCT 23.3 (*)    MCV 105.0 (*)    MCH 34.7 (*)    RDW 18.0 (*)    Neutro Abs 10.2 (*)    Lymphs Abs 0.5 (*)    Abs Immature Granulocytes 0.17 (*)    All other components within normal limits  COMPREHENSIVE METABOLIC PANEL - Abnormal; Notable for the following components:   Glucose, Bld 141 (*)    Creatinine, Ser 1.29 (*)    Calcium 8.8 (*)    Total Protein 5.9 (*)    Albumin 3.1 (*)    GFR calc non Af Amer 38 (*)    GFR calc Af Amer 44 (*)    All other components within normal limits  BRAIN NATRIURETIC PEPTIDE - Abnormal; Notable for the following components:   B Natriuretic Peptide 177.6 (*)    All other components within normal limits  D-DIMER, QUANTITATIVE (NOT AT [facility]) - Abnormal; Notable for the following components:   D-Dimer, Quant 7.66 (*)    All other components within normal limits  TROPONIN I (HIGH SENSITIVITY) - Abnormal; Notable for the following components:   Troponin I (High Sensitivity) 18 (*)    All other components within normal limits  HEMOGLOBIN AND HEMATOCRIT, BLOOD  POC OCCULT BLOOD, ED  TYPE AND SCREEN  TROPONIN I (HIGH SENSITIVITY)    EKG EKG Interpretation  Date/Time:  Thursday June 08 2020 19:11:13 EDT Ventricular Rate:  66 PR Interval:    QRS Duration: 138 QT Interval:  486 QTC Calculation: 510 R Axis:   -46 Text Interpretation: Sinus rhythm Left bundle branch block No significant change since last tracing Confirmed by Gareth Morgan [phone]) on 06/08/2020 9:47:57 PM   Radiology DG Chest 2 View  Result Date: 06/08/2020 CLINICAL DATA:  Shortness of breath. Additional history provided: Patient reports central chest pain, weakness and shortness of breath for 1 day. EXAM: CHEST - 2 VIEW COMPARISON:  Prior chest radiographs 07/21/2019 and earlier FINDINGS: Prior median sternotomy and CABG. Heart size within normal limits. Aortic atherosclerosis. No  appreciable airspace consolidation. Chronic prominence of the interstitial lung markings. No evidence of pleural effusion or pneumothorax. No acute bony abnormality identified. IMPRESSION: No evidence of acute cardiopulmonary abnormality. Redemonstrated chronic prominence of the interstitial lung markings. Prior CABG. Aortic Atherosclerosis (ICD10-I70.0). Electronically Signed   By: Kellie Simmering DO   On: 06/08/2020 16:56    Procedures Procedures (including critical care time)  Medications Ordered in ED Medications  iohexol (OMNIPAQUE) 350 MG/ML injection 100 mL (has no administration in time range)    ED Course  I have reviewed the triage vital signs and the nursing notes.  Pertinent labs & imaging results that were available during my care of the patient were reviewed by me and considered in my medical decision making (see chart for details).    MDM Rules/Calculators/A&P                          85yo female with history of peripheral vascular disease, coronary artery disease s/p CABG, RA, diabetes, hypertension, hyperlipidemia, history of possible seizure with blood transfusion in 1960, recent right hip arthroplasty September 21 who presents with concern  for progressive shortness of breath.  Differential diagnosis for dyspnea includes ACS, PE, COPD exacerbation, CHF exacerbation, anemia, pneumonia, viral etiology such as COVID 19 infection, metabolic abnormality.  Chest x-ray was done which showed no acute abnormalities. EKG was evaluated by me which showed no significant abnormalities.  BNP was was very mildly elevated in 100s, do not feel history, exam, BNP or CXR are consistent with CHF.  Troponin with mild elevation of 18, will recheck.  Did not have any active chest pain at this time.  Hemoglobin has decreased from 10.2-7.7.  Initial hemoglobin of 10.2 was done prior to her hip surgery, and suggest that her anemia is likely secondary to blood loss in setting of recent surgery.  Rectal  exam completed without signs of acute GI bleed, and Hemoccult sent.  It is very likely that her symptoms are secondary to symptomatic anemia in the setting of heart disease, and with hemoglobin less than 8 in setting of coronary artery disease and symptoms, feel transfusion is reasonable--however, patient reports history of seizure with blood transfusion many years ago and we agree that continue to monitor her blood levels, symptoms, and initiating treatment for anemia with iron is appropriate.  She is scheduled to see Dr. Tamala Julian on Wednesday.  She does have right lower extremity swelling greater than left which she reports is new.  Given finding of asymmetric leg swelling, recent surgery and shortness of breath, order D-dimer.  D-dimer is elevated 7.6.  Plan to obtain CT PE study.  If her PE study is negative, will check a DVT study in the morning and a recheck hemoglobin given anemia.  Care signed out to Dr. Roxanne Mins with studies pending.   Final Clinical Impression(s) / ED Diagnoses Final diagnoses:  Shortness of breath  Symptomatic anemia  Elevated d-dimer  Edema of right lower extremity    Rx / DC Orders ED Discharge Orders    None       Gareth Morgan, MD 06/09/20 [phone]

## 2020-06-09 ENCOUNTER — Emergency Department (HOSPITAL_COMMUNITY): Payer: Medicare Other

## 2020-06-09 ENCOUNTER — Emergency Department (HOSPITAL_BASED_OUTPATIENT_CLINIC_OR_DEPARTMENT_OTHER)
Admit: 2020-06-09 | Discharge: 2020-06-09 | Disposition: A | Discharge status: Home / Self Care | Payer: Medicare Other | Attending: Emergency Medicine | Admitting: Emergency Medicine

## 2020-06-09 DIAGNOSIS — R600 Localized edema: Secondary | ICD-10-CM

## 2020-06-09 DIAGNOSIS — R0602 Shortness of breath: Secondary | ICD-10-CM

## 2020-06-09 LAB — HEMOGLOBIN AND HEMATOCRIT, BLOOD
HCT: 22.8 % — ABNORMAL LOW (ref 36.0–46.0)
Hemoglobin: 7.3 g/dL — ABNORMAL LOW (ref 12.0–15.0)

## 2020-06-09 LAB — POC OCCULT BLOOD, ED: Fecal Occult Bld: NEGATIVE

## 2020-06-09 LAB — TROPONIN I (HIGH SENSITIVITY): Troponin I (High Sensitivity): 20 ng/L — ABNORMAL HIGH (ref <18)

## 2020-06-09 MED ORDER — HYDROCODONE-ACETAMINOPHEN 5-325 MG PO TABS: 1.0000 | ORAL_TABLET | Freq: Four times a day (QID) | ORAL | Status: DC | PRN

## 2020-06-09 MED ORDER — IOHEXOL 350 MG/ML SOLN
100.0000 mL | Freq: Once | INTRAVENOUS | Status: AC | PRN
  Administered 2020-06-09: 100 mL via INTRAVENOUS

## 2020-06-09 MED ORDER — SENNOSIDES-DOCUSATE SODIUM 8.6-50 MG PO TABS
1.0000 | ORAL_TABLET | Freq: Every day | ORAL | Status: DC | PRN
  Filled 2020-06-09: qty 1

## 2020-06-09 MED ORDER — TIZANIDINE HCL 4 MG PO TABS: 2.0000 mg | ORAL_TABLET | Freq: Four times a day (QID) | ORAL | Status: DC | PRN

## 2020-06-09 MED ORDER — METOPROLOL TARTRATE 25 MG PO TABS
12.5000 mg | ORAL_TABLET | Freq: Two times a day (BID) | ORAL | Status: DC
  Administered 2020-06-09: 12.5 mg via ORAL
  Filled 2020-06-09 (×2): qty 1

## 2020-06-09 NOTE — Discharge Instructions (Signed)
Your evaluation today did not show any blood clot in the leg.  Your red blood cell count, or hemoglobin, is trending downward.  You are not to the point of requiring a blood transfusion but if your counts continue to decrease you may require one.  We did not find any source of active bleeding here.  Please call your primary care doctor today to schedule repeat blood test on Monday.  If you feel new or worsening symptoms over the weekend you should return to the emergency department for repeat lab testing and evaluation.

## 2020-06-09 NOTE — ED Notes (Signed)
Patient verbalizes understanding of discharge instructions. Opportunity for questioning and answers were provided. Pt discharged from ED. 

## 2020-06-09 NOTE — ED Provider Notes (Signed)
Care assumed from Dr. Billy Fischer, patient presented with shortness of breath, recent right hip surgery.  Hemoglobin has dropped 2 g compared with preop, D-dimer elevated, right leg swollen.  CT angiogram of chest is pending and, if negative, will need to follow-up with DVT study of right leg.  Hemoglobin dropped probably secondary to blood loss from surgery, will recheck hemoglobin to make sure it is not dropping.  CT angiogram showed no evidence of pulmonary embolism.  Hemoglobin had a very slight drop down to 7.3, not felt to be clinically significant.  Venous Doppler is pending.  Case is signed out to Dr. Laverta Baltimore.   Delora Fuel, MD 40/34/74 [phone]

## 2020-06-09 NOTE — ED Provider Notes (Signed)
Blood pressure 102/72, pulse 66, temperature 98.4 F (36.9 C), temperature source Oral, resp. rate 19, height 5\' 6"  (1.676 m), weight 63 kg, SpO2 100 %.  Assuming care from Dr. Roxanne Mins.  In short, Patricia Villegas is a 85 y.o. female with a chief complaint of Shortness of Breath .  Refer to the original H&P for additional details.  The current plan of care is to f/u with [place] and reassess. Hb not down-trending significantly.   08:55 AM  DVT US negative. Repeat Hb at 7.3. No active source of bleeding identified. Question blood loss during the procedure but advised patient to call PCP today and schedule repeat Hb on Monday. Discussed ED return precautions in detail. Patient is feeling well and very anxious to return home which seems appropriate at this time.     Margette Fast, MD 06/09/20 [phone]

## 2020-06-09 NOTE — Progress Notes (Signed)
Right lower extremity venous duplex completed. Refer to "CV Proc" under chart review to view preliminary results.  06/09/2020 9:02 AM Kelby Aline., MHA, RVT, RDCS, RDMS

## 2020-06-14 ENCOUNTER — Observation Stay (HOSPITAL_COMMUNITY): Payer: Medicare Other

## 2020-06-14 ENCOUNTER — Encounter: Payer: Self-pay | Admitting: Nurse Practitioner

## 2020-06-14 ENCOUNTER — Encounter (HOSPITAL_COMMUNITY): Payer: Self-pay | Admitting: Internal Medicine

## 2020-06-14 ENCOUNTER — Ambulatory Visit (INDEPENDENT_AMBULATORY_CARE_PROVIDER_SITE_OTHER): Payer: Medicare Other | Admitting: Nurse Practitioner

## 2020-06-14 ENCOUNTER — Other Ambulatory Visit: Payer: Self-pay

## 2020-06-14 ENCOUNTER — Telehealth: Payer: Self-pay | Admitting: *Deleted

## 2020-06-14 ENCOUNTER — Inpatient Hospital Stay (HOSPITAL_COMMUNITY)
Admission: AD | Admit: 2020-06-14 | Discharge: 2020-06-18 | DRG: 811 | Disposition: A | Discharge status: Home Health | Payer: Medicare Other | Source: Ambulatory Visit | Attending: Internal Medicine | Admitting: Internal Medicine

## 2020-06-14 VITALS — BP 98/50 | HR 80

## 2020-06-14 DIAGNOSIS — Z791 Long term (current) use of non-steroidal anti-inflammatories (NSAID): Secondary | ICD-10-CM

## 2020-06-14 DIAGNOSIS — I10 Essential (primary) hypertension: Secondary | ICD-10-CM

## 2020-06-14 DIAGNOSIS — M109 Gout, unspecified: Secondary | ICD-10-CM | POA: Diagnosis present

## 2020-06-14 DIAGNOSIS — R531 Weakness: Secondary | ICD-10-CM | POA: Diagnosis not present

## 2020-06-14 DIAGNOSIS — D62 Acute posthemorrhagic anemia: Principal | ICD-10-CM | POA: Diagnosis present

## 2020-06-14 DIAGNOSIS — E1151 Type 2 diabetes mellitus with diabetic peripheral angiopathy without gangrene: Secondary | ICD-10-CM | POA: Diagnosis present

## 2020-06-14 DIAGNOSIS — K922 Gastrointestinal hemorrhage, unspecified: Secondary | ICD-10-CM

## 2020-06-14 DIAGNOSIS — I2510 Atherosclerotic heart disease of native coronary artery without angina pectoris: Secondary | ICD-10-CM | POA: Diagnosis not present

## 2020-06-14 DIAGNOSIS — N182 Chronic kidney disease, stage 2 (mild): Secondary | ICD-10-CM | POA: Diagnosis present

## 2020-06-14 DIAGNOSIS — M549 Dorsalgia, unspecified: Secondary | ICD-10-CM | POA: Diagnosis present

## 2020-06-14 DIAGNOSIS — Z809 Family history of malignant neoplasm, unspecified: Secondary | ICD-10-CM

## 2020-06-14 DIAGNOSIS — M069 Rheumatoid arthritis, unspecified: Secondary | ICD-10-CM | POA: Diagnosis present

## 2020-06-14 DIAGNOSIS — K449 Diaphragmatic hernia without obstruction or gangrene: Secondary | ICD-10-CM | POA: Diagnosis present

## 2020-06-14 DIAGNOSIS — K264 Chronic or unspecified duodenal ulcer with hemorrhage: Secondary | ICD-10-CM | POA: Diagnosis present

## 2020-06-14 DIAGNOSIS — K648 Other hemorrhoids: Secondary | ICD-10-CM | POA: Diagnosis present

## 2020-06-14 DIAGNOSIS — I70202 Unspecified atherosclerosis of native arteries of extremities, left leg: Secondary | ICD-10-CM | POA: Diagnosis present

## 2020-06-14 DIAGNOSIS — K269 Duodenal ulcer, unspecified as acute or chronic, without hemorrhage or perforation: Secondary | ICD-10-CM

## 2020-06-14 DIAGNOSIS — Z823 Family history of stroke: Secondary | ICD-10-CM

## 2020-06-14 DIAGNOSIS — G8929 Other chronic pain: Secondary | ICD-10-CM | POA: Diagnosis present

## 2020-06-14 DIAGNOSIS — E119 Type 2 diabetes mellitus without complications: Secondary | ICD-10-CM

## 2020-06-14 DIAGNOSIS — I739 Peripheral vascular disease, unspecified: Secondary | ICD-10-CM | POA: Diagnosis present

## 2020-06-14 DIAGNOSIS — Z8249 Family history of ischemic heart disease and other diseases of the circulatory system: Secondary | ICD-10-CM

## 2020-06-14 DIAGNOSIS — Z841 Family history of disorders of kidney and ureter: Secondary | ICD-10-CM

## 2020-06-14 DIAGNOSIS — I5033 Acute on chronic diastolic (congestive) heart failure: Secondary | ICD-10-CM

## 2020-06-14 DIAGNOSIS — I25810 Atherosclerosis of coronary artery bypass graft(s) without angina pectoris: Secondary | ICD-10-CM | POA: Diagnosis present

## 2020-06-14 DIAGNOSIS — I959 Hypotension, unspecified: Secondary | ICD-10-CM | POA: Diagnosis not present

## 2020-06-14 DIAGNOSIS — I5032 Chronic diastolic (congestive) heart failure: Secondary | ICD-10-CM | POA: Diagnosis present

## 2020-06-14 DIAGNOSIS — K5900 Constipation, unspecified: Secondary | ICD-10-CM | POA: Diagnosis present

## 2020-06-14 DIAGNOSIS — K5730 Diverticulosis of large intestine without perforation or abscess without bleeding: Secondary | ICD-10-CM | POA: Diagnosis present

## 2020-06-14 DIAGNOSIS — I447 Left bundle-branch block, unspecified: Secondary | ICD-10-CM | POA: Diagnosis present

## 2020-06-14 DIAGNOSIS — Z87891 Personal history of nicotine dependence: Secondary | ICD-10-CM

## 2020-06-14 DIAGNOSIS — D649 Anemia, unspecified: Secondary | ICD-10-CM

## 2020-06-14 DIAGNOSIS — Z20822 Contact with and (suspected) exposure to covid-19: Secondary | ICD-10-CM | POA: Diagnosis present

## 2020-06-14 DIAGNOSIS — Z833 Family history of diabetes mellitus: Secondary | ICD-10-CM

## 2020-06-14 DIAGNOSIS — K219 Gastro-esophageal reflux disease without esophagitis: Secondary | ICD-10-CM | POA: Diagnosis present

## 2020-06-14 DIAGNOSIS — I70221 Atherosclerosis of native arteries of extremities with rest pain, right leg: Secondary | ICD-10-CM | POA: Diagnosis present

## 2020-06-14 DIAGNOSIS — Z79899 Other long term (current) drug therapy: Secondary | ICD-10-CM

## 2020-06-14 DIAGNOSIS — Z7982 Long term (current) use of aspirin: Secondary | ICD-10-CM

## 2020-06-14 DIAGNOSIS — D125 Benign neoplasm of sigmoid colon: Secondary | ICD-10-CM

## 2020-06-14 DIAGNOSIS — Z794 Long term (current) use of insulin: Secondary | ICD-10-CM

## 2020-06-14 DIAGNOSIS — M1990 Unspecified osteoarthritis, unspecified site: Secondary | ICD-10-CM | POA: Diagnosis present

## 2020-06-14 DIAGNOSIS — E1122 Type 2 diabetes mellitus with diabetic chronic kidney disease: Secondary | ICD-10-CM | POA: Diagnosis present

## 2020-06-14 DIAGNOSIS — D638 Anemia in other chronic diseases classified elsewhere: Secondary | ICD-10-CM | POA: Diagnosis present

## 2020-06-14 DIAGNOSIS — E785 Hyperlipidemia, unspecified: Secondary | ICD-10-CM | POA: Diagnosis present

## 2020-06-14 DIAGNOSIS — I130 Hypertensive heart and chronic kidney disease with heart failure and stage 1 through stage 4 chronic kidney disease, or unspecified chronic kidney disease: Secondary | ICD-10-CM | POA: Diagnosis present

## 2020-06-14 DIAGNOSIS — N179 Acute kidney failure, unspecified: Secondary | ICD-10-CM | POA: Diagnosis present

## 2020-06-14 DIAGNOSIS — Z96643 Presence of artificial hip joint, bilateral: Secondary | ICD-10-CM | POA: Diagnosis present

## 2020-06-14 DIAGNOSIS — E782 Mixed hyperlipidemia: Secondary | ICD-10-CM | POA: Diagnosis present

## 2020-06-14 DIAGNOSIS — Z7952 Long term (current) use of systemic steroids: Secondary | ICD-10-CM

## 2020-06-14 DIAGNOSIS — R0600 Dyspnea, unspecified: Secondary | ICD-10-CM

## 2020-06-14 DIAGNOSIS — E7800 Pure hypercholesterolemia, unspecified: Secondary | ICD-10-CM | POA: Diagnosis present

## 2020-06-14 DIAGNOSIS — E1140 Type 2 diabetes mellitus with diabetic neuropathy, unspecified: Secondary | ICD-10-CM | POA: Diagnosis present

## 2020-06-14 DIAGNOSIS — E538 Deficiency of other specified B group vitamins: Secondary | ICD-10-CM | POA: Diagnosis present

## 2020-06-14 HISTORY — DX: Anemia, unspecified: D64.9

## 2020-06-14 LAB — FERRITIN: Ferritin: 156 ng/mL (ref 11–307)

## 2020-06-14 LAB — TSH: TSH: 4.392 u[IU]/mL (ref 0.350–4.500)

## 2020-06-14 LAB — CBC WITH DIFFERENTIAL/PLATELET
Abs Immature Granulocytes: 0.2 10*3/uL — ABNORMAL HIGH (ref 0.00–0.07)
Basophils Absolute: 0 10*3/uL (ref 0.0–0.1)
Basophils Relative: 0 %
Eosinophils Absolute: 0 10*3/uL (ref 0.0–0.5)
Eosinophils Relative: 0 %
HCT: 24.7 % — ABNORMAL LOW (ref 36.0–46.0)
Hemoglobin: 7.7 g/dL — ABNORMAL LOW (ref 12.0–15.0)
Immature Granulocytes: 2 %
Lymphocytes Relative: 12 %
Lymphs Abs: 1.2 10*3/uL (ref 0.7–4.0)
MCH: 33.8 pg (ref 26.0–34.0)
MCHC: 31.2 g/dL (ref 30.0–36.0)
MCV: 108.3 fL — ABNORMAL HIGH (ref 80.0–100.0)
Monocytes Absolute: 0.2 10*3/uL (ref 0.1–1.0)
Monocytes Relative: 2 %
Neutro Abs: 8.2 10*3/uL — ABNORMAL HIGH (ref 1.7–7.7)
Neutrophils Relative %: 84 %
Platelets: 244 10*3/uL (ref 150–400)
RBC: 2.28 MIL/uL — ABNORMAL LOW (ref 3.87–5.11)
RDW: 18.6 % — ABNORMAL HIGH (ref 11.5–15.5)
WBC: 9.8 10*3/uL (ref 4.0–10.5)
nRBC: 0.2 % (ref 0.0–0.2)

## 2020-06-14 LAB — COMPREHENSIVE METABOLIC PANEL
ALT: 24 U/L (ref 0–44)
AST: 34 U/L (ref 15–41)
Albumin: 3.2 g/dL — ABNORMAL LOW (ref 3.5–5.0)
Alkaline Phosphatase: 53 U/L (ref 38–126)
Anion gap: 11 (ref 5–15)
BUN: 33 mg/dL — ABNORMAL HIGH (ref 8–23)
CO2: 24 mmol/L (ref 22–32)
Calcium: 9 mg/dL (ref 8.9–10.3)
Chloride: 106 mmol/L (ref 98–111)
Creatinine, Ser: 1.57 mg/dL — ABNORMAL HIGH (ref 0.44–1.00)
GFR calc non Af Amer: 30 mL/min — ABNORMAL LOW (ref >60)
Glucose, Bld: 143 mg/dL — ABNORMAL HIGH (ref 70–99)
Potassium: 3.8 mmol/L (ref 3.5–5.1)
Sodium: 141 mmol/L (ref 135–145)
Total Bilirubin: 0.8 mg/dL (ref 0.3–1.2)
Total Protein: 5.7 g/dL — ABNORMAL LOW (ref 6.5–8.1)

## 2020-06-14 LAB — RESPIRATORY PANEL BY RT PCR (FLU A&B, COVID)
Influenza A by PCR: NEGATIVE
Influenza B by PCR: NEGATIVE
SARS Coronavirus 2 by RT PCR: NEGATIVE

## 2020-06-14 LAB — BRAIN NATRIURETIC PEPTIDE: B Natriuretic Peptide: 171.3 pg/mL — ABNORMAL HIGH (ref 0.0–100.0)

## 2020-06-14 LAB — PREPARE RBC (CROSSMATCH)

## 2020-06-14 LAB — HEMOGLOBIN A1C
Hgb A1c MFr Bld: 6.1 % — ABNORMAL HIGH (ref 4.8–5.6)
Mean Plasma Glucose: 128.37 mg/dL

## 2020-06-14 LAB — VITAMIN D 25 HYDROXY (VIT D DEFICIENCY, FRACTURES): Vit D, 25-Hydroxy: 31.2 ng/mL (ref 30–100)

## 2020-06-14 LAB — FOLATE: Folate: >100 ng/mL (ref >5.9)

## 2020-06-14 LAB — MAGNESIUM: Magnesium: 2.3 mg/dL (ref 1.7–2.4)

## 2020-06-14 LAB — GLUCOSE, CAPILLARY
Glucose-Capillary: 140 mg/dL — ABNORMAL HIGH (ref 70–99)
Glucose-Capillary: 97 mg/dL (ref 70–99)

## 2020-06-14 LAB — IRON AND TIBC
Iron: 62 ug/dL (ref 28–170)
Saturation Ratios: 21 % (ref 10.4–31.8)
TIBC: 300 ug/dL (ref 250–450)
UIBC: 238 ug/dL

## 2020-06-14 LAB — VITAMIN B12: Vitamin B-12: 471 pg/mL (ref 180–914)

## 2020-06-14 LAB — PHOSPHORUS: Phosphorus: 3.7 mg/dL (ref 2.5–4.6)

## 2020-06-14 MED ORDER — HYDROXYCHLOROQUINE SULFATE 200 MG PO TABS
200.0000 mg | ORAL_TABLET | Freq: Two times a day (BID) | ORAL | Status: DC
  Administered 2020-06-14 – 2020-06-18 (×7): 200 mg via ORAL
  Filled 2020-06-14 (×7): qty 1

## 2020-06-14 MED ORDER — PREDNISONE 5 MG PO TABS
5.0000 mg | ORAL_TABLET | Freq: Every day | ORAL | Status: DC
  Administered 2020-06-14 – 2020-06-18 (×4): 5 mg via ORAL
  Filled 2020-06-14 (×4): qty 1

## 2020-06-14 MED ORDER — ENSURE ENLIVE PO LIQD
237.0000 mL | Freq: Two times a day (BID) | ORAL | Status: DC
  Administered 2020-06-15 – 2020-06-16 (×2): 237 mL via ORAL

## 2020-06-14 MED ORDER — INSULIN ASPART 100 UNIT/ML ~~LOC~~ SOLN: 0.0000 [IU] | Freq: Every day | SUBCUTANEOUS | Status: DC

## 2020-06-14 MED ORDER — ALLOPURINOL 100 MG PO TABS
200.0000 mg | ORAL_TABLET | Freq: Every day | ORAL | Status: DC
  Administered 2020-06-14 – 2020-06-18 (×4): 200 mg via ORAL
  Filled 2020-06-14 (×4): qty 2

## 2020-06-14 MED ORDER — SODIUM CHLORIDE 0.9% IV SOLUTION: Freq: Once | INTRAVENOUS | Status: DC

## 2020-06-14 MED ORDER — ENOXAPARIN SODIUM 40 MG/0.4ML ~~LOC~~ SOLN
40.0000 mg | SUBCUTANEOUS | Status: DC
  Administered 2020-06-14: 40 mg via SUBCUTANEOUS
  Filled 2020-06-14: qty 0.4

## 2020-06-14 MED ORDER — METOPROLOL TARTRATE 12.5 MG HALF TABLET: 12.5000 mg | ORAL_TABLET | Freq: Two times a day (BID) | ORAL | Status: DC

## 2020-06-14 MED ORDER — ATORVASTATIN CALCIUM 40 MG PO TABS
40.0000 mg | ORAL_TABLET | Freq: Every day | ORAL | Status: DC
  Administered 2020-06-14 – 2020-06-18 (×4): 40 mg via ORAL
  Filled 2020-06-14 (×4): qty 1

## 2020-06-14 MED ORDER — ASPIRIN EC 81 MG PO TBEC
81.0000 mg | DELAYED_RELEASE_TABLET | Freq: Two times a day (BID) | ORAL | Status: DC
  Administered 2020-06-14 – 2020-06-16 (×4): 81 mg via ORAL
  Filled 2020-06-14 (×4): qty 1

## 2020-06-14 MED ORDER — SPIRONOLACTONE 12.5 MG HALF TABLET
12.5000 mg | ORAL_TABLET | Freq: Every day | ORAL | Status: DC
  Filled 2020-06-14: qty 1

## 2020-06-14 MED ORDER — ALBUMIN HUMAN 25 % IV SOLN
25.0000 g | Freq: Once | INTRAVENOUS | Status: AC
  Administered 2020-06-14: 25 g via INTRAVENOUS
  Filled 2020-06-14: qty 100

## 2020-06-14 MED ORDER — FOLIC ACID 1 MG PO TABS
2.0000 mg | ORAL_TABLET | Freq: Every day | ORAL | Status: DC
  Administered 2020-06-14 – 2020-06-18 (×4): 2 mg via ORAL
  Filled 2020-06-14 (×4): qty 2

## 2020-06-14 MED ORDER — ONDANSETRON HCL 4 MG/2ML IJ SOLN: 4.0000 mg | Freq: Four times a day (QID) | INTRAMUSCULAR | Status: DC | PRN

## 2020-06-14 MED ORDER — ENOXAPARIN SODIUM 30 MG/0.3ML ~~LOC~~ SOLN
30.0000 mg | SUBCUTANEOUS | Status: DC
  Administered 2020-06-15: 30 mg via SUBCUTANEOUS
  Filled 2020-06-14: qty 0.3

## 2020-06-14 MED ORDER — SENNOSIDES-DOCUSATE SODIUM 8.6-50 MG PO TABS
2.0000 | ORAL_TABLET | Freq: Two times a day (BID) | ORAL | Status: DC
  Administered 2020-06-14 – 2020-06-18 (×7): 2 via ORAL
  Filled 2020-06-14 (×7): qty 2

## 2020-06-14 MED ORDER — ACETAMINOPHEN 325 MG PO TABS
650.0000 mg | ORAL_TABLET | Freq: Four times a day (QID) | ORAL | Status: DC | PRN
  Filled 2020-06-14: qty 2

## 2020-06-14 MED ORDER — POLYETHYLENE GLYCOL 3350 17 G PO PACK
17.0000 g | PACK | Freq: Every day | ORAL | Status: DC
  Administered 2020-06-14 – 2020-06-15 (×2): 17 g via ORAL
  Filled 2020-06-14 (×2): qty 1

## 2020-06-14 MED ORDER — INSULIN ASPART 100 UNIT/ML ~~LOC~~ SOLN
0.0000 [IU] | Freq: Three times a day (TID) | SUBCUTANEOUS | Status: DC
  Administered 2020-06-15: 1 [IU] via SUBCUTANEOUS
  Administered 2020-06-16: 2 [IU] via SUBCUTANEOUS

## 2020-06-14 NOTE — Progress Notes (Signed)
Held her BP medication due to hypotension.  IV albumin 25 g ordered x 1 dose to maintain MAP>65.  Closely monitor volume status.

## 2020-06-14 NOTE — Patient Instructions (Addendum)
After Visit Summary:  . We are sending you to the hospital today for admission and transfusion.    Call the [place] office at [phone] if you have any questions, problems or concerns.

## 2020-06-14 NOTE — Telephone Encounter (Signed)
[facility] calling from infusion center @ [phone] to confirm cancellation of appt for pt tomorrow.  Pt got a bed a [facility] 6E  RM 13.

## 2020-06-14 NOTE — H&P (Signed)
History and Physical  Patricia Villegas JEH:[id number] DOB: October 24, 1934 DOA: 06/14/2020  Referring physician: Dr. Cruzita Lederer PCP: Osborne Casco Fransico Him, MD  Outpatient Specialists: Cardiology, Dr. Tamala Julian Patient coming from: Direct transfer from cardiologist office.  Chief Complaint: Fatigue, weakness and legs swelling  HPI: Patricia Villegas is a 85 y.o. female with medical history significant for essential hypertension, hyperlipidemia, type 2 diabetes, coronary artery disease status post CABG, chronic normocytic anemia, chronic back pain post spinal surgeries presented to [facility] as a direct admit from her cardiologist office due to significant weakness, fatigue, and dyspnea without hypoxia for 2 weeks duration.  Associated with bilateral lower extremity edema, onset a month ago, prior to her right hip replacement on 05/30/2020.  Initially presented to the ED on 06/09/2020 for the same complaints where she was found to have a hemoglobin of 7.3, work-up revealed bilateral Doppler ultrasound negative for DVT, CTA PE negative for pulmonary embolism, was advised to follow-up with her primary care provider.  Has had a colonoscopy in the past which was benign, has not had an endoscopy.-Denies use of NSAIDs.  Admits to constipation, has gone more than a week without a bowel movement.  Intermittent nausea with no vomiting.  No dysuria.  No abdominal pain.  Admits to chest discomfort with exertion and when she worries.    At her cardiologist office today she was found to be very weak and was advised to come to the hospital for further evaluation and management of her anemia.    ED Course: Patient was accepted by Dr. Cruzita Lederer, [facility], as a direct admit from her cardiologist office.  Review of Systems: Review of systems as noted in the HPI. All other systems reviewed and are negative.   Past Medical History:  Diagnosis Date  . Anemia   . Anxiety   . Arthritis   . Blood transfusion   . Chest pain    a. Lexiscan Myoview  12/12:  EF 75%, no ischemia or scar;  b.  Echo 12/12:  EF 60-65%, no sig valvular abnormalities   . Closed left fibular fracture    Casted   . Complication of anesthesia 1998   Unable to arouse fully, respiratory depression, pt. reports that the family was told that she would need a trach., but then she stabilized & didn't need it   . Coronary artery disease    sees Dr Aundra Dubin every 6 months  . Depression   . Diabetes mellitus    Greater than 20 years type 2   . GERD (gastroesophageal reflux disease)   . H/O hiatal hernia   . Hypercholesteremia   . Hypertension    Greater than 20 years  . Lumbar disc disease    S/p surgery x4;  Sciatica   . Neuropathy due to secondary diabetes ([place])    Hx: of  . Parastomal hernia of ileal conduit 2009  . Pneumonia    s/p CABG  . [name] (peripheral vascular disease) (HCC)    Stable claudication. Arteriogram 07/2009 no significant aortoiliac disease, has bilateral SFA occlusions with distal reconstitution   . Rheumatoid arthritis ([place])    Dr Berna Bue @ [facility]  . Transfusion reaction 1960   Convulsions (? seizure)  . UTI (lower urinary tract infection) 01/03/15   Past Surgical History:  Procedure Laterality Date  . ABDOMINAL HYSTERECTOMY    . BACK SURGERY     4 back operations most recently 1995, 1998  . CARDIAC CATHETERIZATION     Hx: of 05/14/13  .  CATARACT EXTRACTION W/ INTRAOCULAR LENS  IMPLANT, BILATERAL     Hx: of  . COLONOSCOPY     Hx; of  . CORONARY ARTERY BYPASS GRAFT N/A 05/24/2013   Procedure: CORONARY ARTERY BYPASS GRAFTING (CABG);  Surgeon: Melrose Nakayama, MD;  Location: [place];  Service: Open Heart Surgery;  Laterality: N/A;  . DILATION AND CURETTAGE OF UTERUS    . HERNIA REPAIR  2009  . LEFT HEART CATHETERIZATION WITH CORONARY ANGIOGRAM N/A 05/14/2013   Procedure: LEFT HEART CATHETERIZATION WITH CORONARY ANGIOGRAM;  Surgeon: Larey Dresser, MD;  Location: [facility] CATH LAB;  Service: Cardiovascular;  Laterality: N/A;   . TONSILLECTOMY    . TOTAL HIP ARTHROPLASTY Left 01/10/2015   Procedure: TOTAL HIP ARTHROPLASTY ANTERIOR APPROACH;  Surgeon: Melrose Nakayama, MD;  Location: [place];  Service: Orthopedics;  Laterality: Left;  . TOTAL HIP ARTHROPLASTY Right 05/30/2020   Procedure: RIGHT TOTAL HIP ARTHROPLASTY ANTERIOR APPROACH;  Surgeon: Melrose Nakayama, MD;  [facility];  Service: Orthopedics;  Laterality: Right;    Social History:  reports that she quit smoking about 23 years ago. Her smoking use included cigarettes. She has a 5.00 pack-year smoking history. Her smokeless tobacco use includes chew. She reports that she does not drink alcohol and does not use drugs.   No Known Allergies  Family History  Problem Relation Age of Onset  . Coronary artery disease Mother 53  . Diabetes Mother   . Stroke Mother   . Heart attack Mother   . Cancer Brother   . Heart disease Brother   . Stroke Brother   . Kidney disease Brother   . Cancer Sister   . Diabetes Brother       Prior to Admission medications   Medication Sig Start Date End Date Taking? Authorizing Provider  allopurinol (ZYLOPRIM) 100 MG tablet Take 200 mg by mouth daily.  10/26/19   [provider]  aspirin EC 81 MG tablet Take 1 tablet (81 mg total) by mouth 2 (two) times daily after a meal. 05/30/20   Loni Dolly, PA-C  benazepril (LOTENSIN) 20 MG tablet Take 20 mg by mouth daily. 08/02/13   Larey Dresser, MD  folic acid (FOLVITE) 1 MG tablet Take 2 mg by mouth daily. 05/10/20   [provider]  furosemide (LASIX) 20 MG tablet Take 20 mg by mouth daily.     [provider]  HYDROcodone-acetaminophen (NORCO/VICODIN) 5-325 MG tablet Take 1-2 tablets by mouth every 6 (six) hours as needed for moderate pain or severe pain. 05/30/20   Loni Dolly, PA-C  hydroxychloroquine (PLAQUENIL) 200 MG tablet Take 200 mg by mouth 2 (two) times daily. 10/26/19   [provider]  LANTUS SOLOSTAR 100 UNIT/ML Solostar Pen Inject  10 Units into the skin daily. Patient reports she takes at 2pm daily per pt 09/01/19   [provider]  metoprolol tartrate (LOPRESSOR) 25 MG tablet Take 0.5 tablets (12.5 mg total) by mouth 2 (two) times daily. 07/19/19   Darreld [name], PA-C  ONE TOUCH ULTRA TEST test strip  06/24/13   [provider]  predniSONE (DELTASONE) 5 MG tablet Take 5 mg by mouth daily with breakfast.     [provider]  senna-docusate (SENOKOT-S) 8.6-50 MG tablet Take 1 tablet by mouth daily as needed for mild constipation.    [provider]  simvastatin (ZOCOR) 80 MG tablet Take 80 mg by mouth at bedtime.      [provider]  spironolactone (  ALDACTONE) 25 MG tablet Take 0.5 tablets (12.5 mg total) by mouth daily. 07/28/19   Belva Crome, MD  tiZANidine (ZANAFLEX) 2 MG tablet Take 1-2 tablets (2-4 mg total) by mouth every 6 (six) hours as needed for muscle spasms. 05/30/20 05/30/21  Loni Dolly, PA-C    Physical Exam: There were no vitals taken for this visit.  . General: 85 y.o. year-old female well developed well nourished in no acute distress.  Alert and oriented x3. . Cardiovascular: Regular rate and rhythm with no rubs or gallops.  No thyromegaly or JVD noted.  2+ pitting edema in lower extremities bilaterally.  Difficult to palpate dorsalis pedis pulses. Marland Kitchen Respiratory: Clear to auscultation with no wheezes or rales. Good inspiratory effort. . Abdomen: Soft nontender nondistended with normal bowel sounds x4 quadrants. . Muskuloskeletal: No cyanosis or clubbing.  2+ pitting edema in lower extremities bilaterally.   . Neuro: CN II-XII intact, strength, sensation, reflexes . Skin: No ulcerative lesions noted or rashes . Psychiatry: Judgement and insight appear normal. Mood is appropriate for condition and setting          Labs on Admission:  Basic Metabolic Panel: Recent Labs  Lab 06/08/20 1629  NA 135  K 4.3  CL 104  CO2 22  GLUCOSE 141*  BUN 22   CREATININE 1.29*  CALCIUM 8.8*   Liver Function Tests: Recent Labs  Lab 06/08/20 1629  AST 34  ALT 21  ALKPHOS 48  BILITOT 1.0  PROT 5.9*  ALBUMIN 3.1*   No results for input(s): LIPASE, AMYLASE in the last 168 hours. No results for input(s): AMMONIA in the last 168 hours. CBC: Recent Labs  Lab 06/08/20 1629 06/09/20 0600  WBC 11.2*  --   NEUTROABS 10.2*  --   HGB 7.7* 7.3*  HCT 23.3* 22.8*  MCV 105.0*  --   PLT 292  --    Cardiac Enzymes: No results for input(s): CKTOTAL, CKMB, CKMBINDEX, TROPONINI in the last 168 hours.  BNP (last 3 results) Recent Labs    06/08/20 1630  BNP 177.6*    ProBNP (last 3 results) Recent Labs    07/19/19 1607 08/31/19 1052  PROBNP 1,614* 980*    CBG: No results for input(s): GLUCAP in the last 168 hours.  Radiological Exams on Admission: No results found.  EKG: I independently viewed the EKG done and my findings are as followed: Not available at the time of this dictation.  Assessment/Plan Present on Admission: . Symptomatic anemia  Active Problems:   Symptomatic anemia Symptomatic anemia, unclear etiology Recent right knee replacement on 05/30/2020 with baseline hemoglobin 10 Presented at her cardiologist's office for a follow-up found to be very weak and dyspneic without hypoxia, advised to go to the hospital for further evaluation and workup. Her last hemoglobin 7.3, on 06/09/2020, negative FOBT 06/09/20 Obtain CBC with differential, iron studies, anemia work-up Transfuse hemoglobin less than eight Replete iron if deficient States she had an occasional blood in the stool 2 or 3 weeks ago. Has had a colonoscopy in the past which was benign, has not had an endoscopy.-Denies use of NSAIDs.  Admits to constipation. Consider GI consult to r/o GI as an etiology if anemia does not improve.  Generalized weakness, suspect multifactorial, contributed by symptomatic anemia Obtain urinalysis to rule out UTI TSH Treat anemia  as indicated above Encourage increase oral protein calorie intake Start oral supplements PT OT to assess  Bilateral lower extremity edema, unclear etiology Presented with bilateral 2+ pitting  edema on exam, onset about a month ago Obtain BNP, TSH Hold off IV fluids Poor peripheral dorsalis pedis pulses, rule out peripheral artery disease Obtain ABI Elevate legs  Dyspnea without hypoxia Suspect cardiac related Obtain BNP and chest x-ray Hold off IV fluids  Coronary artery disease status post CABG Denies any anginal symptoms at the time of this visit Resume home regimen  Chronic diastolic CHF Last 2D echo done on 07/26/2019 revealed normal LVEF Get BNP, she has bilateral lower extremity pitting edema x1 month duration Follows with cardiology Resume cardiac medications Start strict I's and O's and daily weight  Constipation, suspect opiate induced Reports no bowel movements in the last 7 days Start bowel regimen  Type 2 diabetes, no labs available to determine whether hyperglycemic or controlled Hemoglobin A1c Heart healthy carb modified diet Insulin sliding scale  Essential hypertension Resume home regimen Monitor vital signs  Hyperlipidemia Resume home regimen  Chronic back pain/gout Resume home regimen   DVT prophylaxis: Subcu Lovenox daily  Code Status: DO NOT INTUBATE, stated by the patient herself.  Family Communication: None at bedside.  Disposition Plan: Admit to telemetry cardiac  Consults called: Cardiology  Admission status: Observation status.   Status is: Observation    Dispo:  Patient From: Home  Planned Disposition: Home with Health Care Svc  Expected discharge date: 06/15/20  Medically stable for discharge: No, ongoing work-up of symptomatic anemia, generalized weakness.   Kayleen Memos MD [facility] Pager [phone]  If 7PM-7AM, please contact night-coverage [email or web address] Password [email or web address]  06/14/2020, 12:49 PM

## 2020-06-15 ENCOUNTER — Encounter (HOSPITAL_COMMUNITY): Payer: Medicare Other

## 2020-06-15 ENCOUNTER — Observation Stay (HOSPITAL_COMMUNITY): Payer: Medicare Other

## 2020-06-15 DIAGNOSIS — I959 Hypotension, unspecified: Secondary | ICD-10-CM | POA: Diagnosis not present

## 2020-06-15 DIAGNOSIS — E782 Mixed hyperlipidemia: Secondary | ICD-10-CM | POA: Diagnosis not present

## 2020-06-15 DIAGNOSIS — E785 Hyperlipidemia, unspecified: Secondary | ICD-10-CM | POA: Diagnosis present

## 2020-06-15 DIAGNOSIS — I70202 Unspecified atherosclerosis of native arteries of extremities, left leg: Secondary | ICD-10-CM

## 2020-06-15 DIAGNOSIS — M069 Rheumatoid arthritis, unspecified: Secondary | ICD-10-CM

## 2020-06-15 DIAGNOSIS — Z20822 Contact with and (suspected) exposure to covid-19: Secondary | ICD-10-CM | POA: Diagnosis present

## 2020-06-15 DIAGNOSIS — K922 Gastrointestinal hemorrhage, unspecified: Secondary | ICD-10-CM | POA: Diagnosis not present

## 2020-06-15 DIAGNOSIS — I5032 Chronic diastolic (congestive) heart failure: Secondary | ICD-10-CM | POA: Diagnosis present

## 2020-06-15 DIAGNOSIS — I25709 Atherosclerosis of coronary artery bypass graft(s), unspecified, with unspecified angina pectoris: Secondary | ICD-10-CM | POA: Diagnosis not present

## 2020-06-15 DIAGNOSIS — N182 Chronic kidney disease, stage 2 (mild): Secondary | ICD-10-CM | POA: Diagnosis present

## 2020-06-15 DIAGNOSIS — K449 Diaphragmatic hernia without obstruction or gangrene: Secondary | ICD-10-CM | POA: Diagnosis present

## 2020-06-15 DIAGNOSIS — K648 Other hemorrhoids: Secondary | ICD-10-CM | POA: Diagnosis present

## 2020-06-15 DIAGNOSIS — E7800 Pure hypercholesterolemia, unspecified: Secondary | ICD-10-CM | POA: Diagnosis present

## 2020-06-15 DIAGNOSIS — R5382 Chronic fatigue, unspecified: Secondary | ICD-10-CM | POA: Diagnosis not present

## 2020-06-15 DIAGNOSIS — D125 Benign neoplasm of sigmoid colon: Secondary | ICD-10-CM | POA: Diagnosis present

## 2020-06-15 DIAGNOSIS — G8929 Other chronic pain: Secondary | ICD-10-CM | POA: Diagnosis present

## 2020-06-15 DIAGNOSIS — K264 Chronic or unspecified duodenal ulcer with hemorrhage: Secondary | ICD-10-CM | POA: Diagnosis present

## 2020-06-15 DIAGNOSIS — I70219 Atherosclerosis of native arteries of extremities with intermittent claudication, unspecified extremity: Secondary | ICD-10-CM

## 2020-06-15 DIAGNOSIS — K269 Duodenal ulcer, unspecified as acute or chronic, without hemorrhage or perforation: Secondary | ICD-10-CM | POA: Diagnosis not present

## 2020-06-15 DIAGNOSIS — K219 Gastro-esophageal reflux disease without esophagitis: Secondary | ICD-10-CM | POA: Diagnosis present

## 2020-06-15 DIAGNOSIS — I130 Hypertensive heart and chronic kidney disease with heart failure and stage 1 through stage 4 chronic kidney disease, or unspecified chronic kidney disease: Secondary | ICD-10-CM | POA: Diagnosis present

## 2020-06-15 DIAGNOSIS — N179 Acute kidney failure, unspecified: Secondary | ICD-10-CM | POA: Diagnosis present

## 2020-06-15 DIAGNOSIS — E1140 Type 2 diabetes mellitus with diabetic neuropathy, unspecified: Secondary | ICD-10-CM | POA: Diagnosis present

## 2020-06-15 DIAGNOSIS — I2510 Atherosclerotic heart disease of native coronary artery without angina pectoris: Secondary | ICD-10-CM | POA: Diagnosis present

## 2020-06-15 DIAGNOSIS — M1990 Unspecified osteoarthritis, unspecified site: Secondary | ICD-10-CM | POA: Diagnosis present

## 2020-06-15 DIAGNOSIS — I739 Peripheral vascular disease, unspecified: Secondary | ICD-10-CM

## 2020-06-15 DIAGNOSIS — D649 Anemia, unspecified: Secondary | ICD-10-CM | POA: Diagnosis present

## 2020-06-15 DIAGNOSIS — M549 Dorsalgia, unspecified: Secondary | ICD-10-CM | POA: Diagnosis present

## 2020-06-15 DIAGNOSIS — K635 Polyp of colon: Secondary | ICD-10-CM | POA: Diagnosis not present

## 2020-06-15 DIAGNOSIS — I10 Essential (primary) hypertension: Secondary | ICD-10-CM

## 2020-06-15 DIAGNOSIS — D539 Nutritional anemia, unspecified: Secondary | ICD-10-CM | POA: Insufficient documentation

## 2020-06-15 DIAGNOSIS — I998 Other disorder of circulatory system: Secondary | ICD-10-CM | POA: Diagnosis not present

## 2020-06-15 DIAGNOSIS — K5730 Diverticulosis of large intestine without perforation or abscess without bleeding: Secondary | ICD-10-CM | POA: Diagnosis present

## 2020-06-15 DIAGNOSIS — I25810 Atherosclerosis of coronary artery bypass graft(s) without angina pectoris: Secondary | ICD-10-CM | POA: Diagnosis present

## 2020-06-15 DIAGNOSIS — K5900 Constipation, unspecified: Secondary | ICD-10-CM | POA: Diagnosis present

## 2020-06-15 DIAGNOSIS — D62 Acute posthemorrhagic anemia: Secondary | ICD-10-CM | POA: Diagnosis present

## 2020-06-15 DIAGNOSIS — M109 Gout, unspecified: Secondary | ICD-10-CM | POA: Diagnosis present

## 2020-06-15 LAB — CBC
HCT: 22.6 % — ABNORMAL LOW (ref 36.0–46.0)
Hemoglobin: 7.1 g/dL — ABNORMAL LOW (ref 12.0–15.0)
MCH: 32.4 pg (ref 26.0–34.0)
MCHC: 31.4 g/dL (ref 30.0–36.0)
MCV: 103.2 fL — ABNORMAL HIGH (ref 80.0–100.0)
Platelets: 166 10*3/uL (ref 150–400)
RBC: 2.19 MIL/uL — ABNORMAL LOW (ref 3.87–5.11)
RDW: 20 % — ABNORMAL HIGH (ref 11.5–15.5)
WBC: 6.6 10*3/uL (ref 4.0–10.5)
nRBC: 0.3 % — ABNORMAL HIGH (ref 0.0–0.2)

## 2020-06-15 LAB — URINALYSIS, ROUTINE W REFLEX MICROSCOPIC
Bacteria, UA: NONE SEEN
Bilirubin Urine: NEGATIVE
Glucose, UA: NEGATIVE mg/dL
Hgb urine dipstick: NEGATIVE
Ketones, ur: NEGATIVE mg/dL
Nitrite: NEGATIVE
Protein, ur: NEGATIVE mg/dL
Specific Gravity, Urine: 1.016 (ref 1.005–1.030)
pH: 5 (ref 5.0–8.0)

## 2020-06-15 LAB — HEMOGLOBIN AND HEMATOCRIT, BLOOD
HCT: 27.8 % — ABNORMAL LOW (ref 36.0–46.0)
Hemoglobin: 9 g/dL — ABNORMAL LOW (ref 12.0–15.0)

## 2020-06-15 LAB — GLUCOSE, CAPILLARY
Glucose-Capillary: 86 mg/dL (ref 70–99)
Glucose-Capillary: 131 mg/dL — ABNORMAL HIGH (ref 70–99)
Glucose-Capillary: 114 mg/dL — ABNORMAL HIGH (ref 70–99)
Glucose-Capillary: 144 mg/dL — ABNORMAL HIGH (ref 70–99)

## 2020-06-15 LAB — BASIC METABOLIC PANEL
Anion gap: 7 (ref 5–15)
BUN: 29 mg/dL — ABNORMAL HIGH (ref 8–23)
CO2: 23 mmol/L (ref 22–32)
Calcium: 8.6 mg/dL — ABNORMAL LOW (ref 8.9–10.3)
Chloride: 109 mmol/L (ref 98–111)
Creatinine, Ser: 1.15 mg/dL — ABNORMAL HIGH (ref 0.44–1.00)
GFR calc non Af Amer: 44 mL/min — ABNORMAL LOW (ref >60)
Glucose, Bld: 188 mg/dL — ABNORMAL HIGH (ref 70–99)
Potassium: 4.1 mmol/L (ref 3.5–5.1)
Sodium: 139 mmol/L (ref 135–145)

## 2020-06-15 LAB — PREPARE RBC (CROSSMATCH)

## 2020-06-15 MED ORDER — SODIUM CHLORIDE 0.9% IV SOLUTION: Freq: Once | INTRAVENOUS | Status: DC

## 2020-06-15 NOTE — Consult Note (Signed)
Hospital Consult    Reason for Consult: Critical limb ischemia of the right lower extremity Referring Physician: Dr. Cathlean Sauer MRN #:  [id number]  History of Present Illness: This is a 85 y.o. female with multiple medical problems including coronary artery disease, hypertension, hyperlipidemia, diabetes, diastolic heart failure, chronic anemia that vascular surgery has been consulted for critical limb ischemia of the right lower extremity.  Patient has been hospitalized for approximately 24 hours and was admitted with anemia and also chronic fatigue and weakness following hip surgery.  She had right hip surgery on 05/30/20.  Regarding her lower extremities she endorses chronic right hip pain and bilateral lower extremity swelling since at least August.  She states she has had intermittent numbness in her feet for the last several years and has been told she has neuropathy.  Over the last 2 to 3 months she has had some increasing numbness in both feet.  She denies any active tissue loss.  She has no pain at baseline.  She does not feel this keeps her awake at night.  Ultimately states she is 85 years old and does not want any more procedures or surgeries at this time.  Past Medical History:  Diagnosis Date  . Anemia   . Anemia 06/14/2020  . Anxiety   . Arthritis   . Blood transfusion   . Chest pain    a. Lexiscan Myoview 12/12:  EF 75%, no ischemia or scar;  b.  Echo 12/12:  EF 60-65%, no sig valvular abnormalities   . Closed left fibular fracture    Casted   . Complication of anesthesia 1998   Unable to arouse fully, respiratory depression, pt. reports that the family was told that she would need a trach., but then she stabilized & didn't need it   . Coronary artery disease    sees Dr Aundra Dubin every 6 months  . Depression   . Diabetes mellitus    Greater than 20 years type 2   . GERD (gastroesophageal reflux disease)   . H/O hiatal hernia   . Hypercholesteremia   . Hypertension    Greater  than 20 years  . Lumbar disc disease    S/p surgery x4;  Sciatica   . Neuropathy due to secondary diabetes ([place])    Hx: of  . Parastomal hernia of ileal conduit 2009  . Pneumonia    s/p CABG  . [name] (peripheral vascular disease) (HCC)    Stable claudication. Arteriogram 07/2009 no significant aortoiliac disease, has bilateral SFA occlusions with distal reconstitution   . Rheumatoid arthritis ([place])    Dr Berna Bue @ [facility]  . Transfusion reaction 1960   Convulsions (? seizure)  . UTI (lower urinary tract infection) 01/03/15    Past Surgical History:  Procedure Laterality Date  . ABDOMINAL HYSTERECTOMY    . BACK SURGERY     4 back operations most recently 1995, 1998  . CARDIAC CATHETERIZATION     Hx: of 05/14/13  . CATARACT EXTRACTION W/ INTRAOCULAR LENS  IMPLANT, BILATERAL     Hx: of  . COLONOSCOPY     Hx; of  . CORONARY ARTERY BYPASS GRAFT N/A 05/24/2013   Procedure: CORONARY ARTERY BYPASS GRAFTING (CABG);  Surgeon: Melrose Nakayama, MD;  Location: [place];  Service: Open Heart Surgery;  Laterality: N/A;  . DILATION AND CURETTAGE OF UTERUS    . HERNIA REPAIR  2009  . LEFT HEART CATHETERIZATION WITH CORONARY ANGIOGRAM N/A 05/14/2013   Procedure: LEFT HEART  CATHETERIZATION WITH CORONARY ANGIOGRAM;  Surgeon: Larey Dresser, MD;  Location: [facility] CATH LAB;  Service: Cardiovascular;  Laterality: N/A;  . TONSILLECTOMY    . TOTAL HIP ARTHROPLASTY Left 01/10/2015   Procedure: TOTAL HIP ARTHROPLASTY ANTERIOR APPROACH;  Surgeon: Melrose Nakayama, MD;  Location: [place];  Service: Orthopedics;  Laterality: Left;  . TOTAL HIP ARTHROPLASTY Right 05/30/2020   Procedure: RIGHT TOTAL HIP ARTHROPLASTY ANTERIOR APPROACH;  Surgeon: Melrose Nakayama, MD;  Location[facility];  Service: Orthopedics;  Laterality: Right;    No Known Allergies  Prior to Admission medications   Medication Sig Start Date End Date Taking? Authorizing Provider  allopurinol (ZYLOPRIM) 100 MG tablet Take 200 mg by mouth  daily.  10/26/19  Yes [provider]  aspirin EC 81 MG tablet Take 1 tablet (81 mg total) by mouth 2 (two) times daily after a meal. 05/30/20  Yes Nida, Mitzi Hansen, PA-C  benazepril (LOTENSIN) 20 MG tablet Take 20 mg by mouth daily. 08/02/13  Yes Larey Dresser, MD  folic acid (FOLVITE) 1 MG tablet Take 2 mg by mouth daily. 05/10/20  Yes [provider]  furosemide (LASIX) 20 MG tablet Take 20 mg by mouth daily.    Yes [provider]  furosemide (LASIX) 40 MG tablet Take 40 mg by mouth.   Yes [provider]  HYDROcodone-acetaminophen (NORCO/VICODIN) 5-325 MG tablet Take 1-2 tablets by mouth every 6 (six) hours as needed for moderate pain or severe pain. 05/30/20  Yes Loni Dolly, PA-C  hydroxychloroquine (PLAQUENIL) 200 MG tablet Take 200 mg by mouth 2 (two) times daily. 10/26/19  Yes [provider]  LANTUS SOLOSTAR 100 UNIT/ML Solostar Pen Inject 10 Units into the skin every evening.  09/01/19  Yes [provider]  metoprolol tartrate (LOPRESSOR) 25 MG tablet Take 0.5 tablets (12.5 mg total) by mouth 2 (two) times daily. 07/19/19  Yes Sande Rives E, PA-C  ONE TOUCH ULTRA TEST test strip  06/24/13  Yes [provider]  predniSONE (DELTASONE) 5 MG tablet Take 5 mg by mouth daily with breakfast.    Yes [provider]  senna-docusate (SENOKOT-S) 8.6-50 MG tablet Take 1 tablet by mouth daily as needed for mild constipation.   Yes [provider]  simvastatin (ZOCOR) 80 MG tablet Take 80 mg by mouth at bedtime.     Yes [provider]  spironolactone (ALDACTONE) 25 MG tablet Take 0.5 tablets (12.5 mg total) by mouth daily. 07/28/19  Yes Belva Crome, MD  tiZANidine (ZANAFLEX) 2 MG tablet Take 1-2 tablets (2-4 mg total) by mouth every 6 (six) hours as needed for muscle spasms. 05/30/20 05/30/21 Yes Loni Dolly, PA-C    Social History   Socioeconomic History  . Marital status: Married    Spouse name: Not on  file  . Number of children: 2  . Years of education: 62  . Highest education level: Not on file  Occupational History  . Occupation: Retired    Fish farm manager: RETIRED  Tobacco Use  . Smoking status: Former Smoker    Packs/day: 0.50    Years: 10.00    Pack years: 5.00    Types: Cigarettes    Quit date: 09/09/1996    Years since quitting: 23.7  . Smokeless tobacco: Current User    Types: Chew  Vaping Use  . Vaping Use: Never used  Substance and Sexual Activity  . Alcohol use: No  . Drug use: No  . Sexual activity: Not Currently  Other Topics  Concern  . Not on file  Social History Narrative   Married and has two children    Social Determinants of Health   Financial Resource Strain:   . Difficulty of Paying Living Expenses: Not on file  Food Insecurity:   . Worried About Charity fundraiser in the Last Year: Not on file  . Ran Out of Food in the Last Year: Not on file  Transportation Needs:   . Lack of Transportation (Medical): Not on file  . Lack of Transportation (Non-Medical): Not on file  Physical Activity:   . Days of Exercise per Week: Not on file  . Minutes of Exercise per Session: Not on file  Stress:   . Feeling of Stress : Not on file  Social Connections:   . Frequency of Communication with Friends and Family: Not on file  . Frequency of Social Gatherings with Friends and Family: Not on file  . Attends Religious Services: Not on file  . Active Member of Clubs or Organizations: Not on file  . Attends Archivist Meetings: Not on file  . Marital Status: Not on file  Intimate Partner Violence:   . Fear of Current or Ex-Partner: Not on file  . Emotionally Abused: Not on file  . Physically Abused: Not on file  . Sexually Abused: Not on file     Family History  Problem Relation Age of Onset  . Coronary artery disease Mother 27  . Diabetes Mother   . Stroke Mother   . Heart attack Mother   . Cancer Brother   . Heart disease Brother   . Stroke Brother    . Kidney disease Brother   . Cancer Sister   . Diabetes Brother     ROS: [x]  Positive   [ ]  Negative   [ ]  All sytems reviewed and are negative  Cardiovascular: []  chest pain/pressure []  palpitations []  SOB lying flat []  DOE [x]  pain in legs while walking []  pain in legs at rest []  pain in legs at night []  non-healing ulcers []  hx of DVT [x]  swelling in legs  Pulmonary: []  productive cough []  asthma/wheezing []  home O2  Neurologic: []  weakness in []  arms []  legs []  numbness in []  arms []  legs []  hx of CVA []  mini stroke [] difficulty speaking or slurred speech []  temporary loss of vision in one eye []  dizziness  Hematologic: []  hx of cancer []  bleeding problems []  problems with blood clotting easily  Endocrine:   []  diabetes []  thyroid disease  GI []  vomiting blood []  blood in stool  GU: []  CKD/renal failure []  HD--[]  M/W/F or []  T/T/S []  burning with urination []  blood in urine  Psychiatric: []  anxiety []  depression  Musculoskeletal: []  arthritis []  joint pain  Integumentary: []  rashes []  ulcers  Constitutional: []  fever []  chills   Physical Examination  Vitals:   06/15/20 1252 06/15/20 1325  BP: (!) 117/55 (!) 126/93  Pulse: 74   Resp: 18 18  Temp: 98.6 F (37 C) 97.8 F (36.6 C)  SpO2: 100%    Body mass index is 22.44 kg/m.  General:  WDWN in NAD Gait: Not observed HENT: WNL, normocephalic Pulmonary: normal non-labored breathing, without Rales, rhonchi,  wheezing Cardiac: regular, without  Murmurs, rubs or gallops Abdomen: soft, NT/ND, no masses Vascular Exam/Pulses: Right femoral pulse palpable and weaker then left femoral pulse Right peroneal and AT monophasic signal above the ankle, foot is warm, motor intact, no tissue loss Left PT  monophasic signal at the ankle, foot is warm, motor intact, no tissue loss Musculoskeletal: no muscle wasting or atrophy  Neurologic: A&O X 3; Appropriate Affect ; SENSATION: normal; MOTOR  FUNCTION:  moving all extremities equally. Speech is fluent/normal   CBC    Component Value Date/Time   WBC 6.6 06/15/2020 0135   RBC 2.19 (L) 06/15/2020 0135   HGB 7.1 (L) 06/15/2020 0135   HGB 10.6 (L) 07/19/2019 1607   HCT 22.6 (L) 06/15/2020 0135   HCT 32.6 (L) 07/19/2019 1607   PLT 166 06/15/2020 0135   PLT 208 07/19/2019 1607   MCV 103.2 (H) 06/15/2020 0135   MCV 88 07/19/2019 1607   MCH 32.4 06/15/2020 0135   MCHC 31.4 06/15/2020 0135   RDW 20.0 (H) 06/15/2020 0135   RDW 13.9 07/19/2019 1607   LYMPHSABS 1.2 06/14/2020 1351   LYMPHSABS 1.7 07/19/2019 1607   MONOABS 0.2 06/14/2020 1351   EOSABS 0.0 06/14/2020 1351   EOSABS 0.0 07/19/2019 1607   BASOSABS 0.0 06/14/2020 1351   BASOSABS 0.0 07/19/2019 1607    BMET    Component Value Date/Time   NA 139 06/15/2020 0135   NA 138 08/31/2019 1052   K 4.1 06/15/2020 0135   CL 109 06/15/2020 0135   CO2 23 06/15/2020 0135   GLUCOSE 188 (H) 06/15/2020 0135   BUN 29 (H) 06/15/2020 0135   BUN 14 08/31/2019 1052   CREATININE 1.15 (H) 06/15/2020 0135   CALCIUM 8.6 (L) 06/15/2020 0135   GFRNONAA 44 (L) 06/15/2020 0135   GFRAA 44 (L) 06/08/2020 1629    COAGS: Lab Results  Component Value Date   INR 1.0 05/24/2020   INR 1.00 12/29/2014   INR 1.41 05/24/2013     Non-Invasive Vascular Imaging:     ABIs today were reported as absent on the right and dampened monophasic on the left with non-compressible ABIs  ASSESSMENT/PLAN: This is a 85 y.o. female with multiple medical comorbidities admitted with anemia getting a blood transfusion in addition to generalized fatigue that vascular surgery has been consulted for critical limb ischemia of the right lower extremity.  Her ABIs suggested absent flow on the right but we were able to find a monophasic anterior tibial and peroneal signal above the ankle.  Her right foot is warm and motor intact.  Denies any pain in the foot.  Discussed with her that critical limb ischemia typically  presents with tissue loss or rest pain.  She has no active wounds at this time and no pain.  She does describe some numbness in both feet and discussed some of this could be from her chronic neuropathy versus chronic ischemia.  I discussed proceeding with lower extremity arteriogram and possible intervention to further evaluate.  Patient states she is 85 years old and otherwise is not interested in any additional invasive procedure or surgery at this time.  I offered follow-up in our office and ultimately she would prefer to contact our office if her symptoms worsen for reevaluation.  I did give her my card and contact information.  Please call vascular if we can be of further assistance.  Marty Heck, MD [facility] [place] Office: [place]

## 2020-06-15 NOTE — Progress Notes (Signed)
Pt is up to walk on RW, short trips to door with pt describing sensitivity on R thigh from previous surgery to do THA.  Following up with mobility will require encouragement of greater standing times, increasing RLE strength and following up with HHPT as planned.  06/15/20 1200  PT Visit Information  Last PT Received On 06/15/20  Assistance Needed +1  History of Present Illness 85 yo female with onset of BLE edema and weakness was sent to hosp by her cardiologist, and cleared for DVT and PE. Has anemia at 7.1 after being transfused, another transfusion planned.  Recent R THA with direct anterior approach and WBAT, about 2 weeks ago.  SOB but not with PT.  PMHx:  HTN, back pain, CHF, CAD with CABG, [name], DM, PN, depression, lumbar surgeries, L THA  Precautions  Precautions Fall  Precaution Comments Direct anterior approach R THA  Restrictions  Weight Bearing Restrictions No  Home Living  Family/patient expects to be discharged to: Private residence  Living Arrangements Spouse/significant other  Available Help at Discharge Family;Available 24 hours/day  Type of Home House  Home Access Stairs to enter  Entrance Stairs-Number of Steps 4  Entrance Stairs-Rails Can reach both  Home Layout Able to live on main level with bedroom/bathroom  Tax adviser - 2 wheels;Cane - single point;BSC  Additional Comments has had 4 home therapy sessions since her surgery  Prior Function  Level of Independence Independent with assistive device(s)  Comments family asks her to call for help but trying to walk alone  Communication  Communication No difficulties  Pain Assessment  Pain Assessment Faces  Faces Pain Scale 4  Pain Location right hip  Pain Descriptors / Indicators Guarding;Sore  Pain Intervention(s) Monitored during session;Repositioned;Premedicated before session  Cognition  Arousal/Alertness Awake/alert  Behavior During Therapy WFL for tasks assessed/performed   Overall Cognitive Status Within Functional Limits for tasks assessed  General Comments pt is asking and answering questions appropriately  Upper Extremity Assessment  Upper Extremity Assessment Defer to OT evaluation  Lower Extremity Assessment  Lower Extremity Assessment RLE deficits/detail  RLE Deficits / Details requires help to assist off the bed  RLE Coordination decreased gross motor  Cervical / Trunk Assessment  Cervical / Trunk Assessment Kyphotic  Bed Mobility  Overal bed mobility Needs Assistance  Bed Mobility Supine to Sit  Supine to sit Mod assist  General bed mobility comments assisted trunk and RLE  Transfers  Overall transfer level Needs assistance  Equipment used Rolling walker (2 wheeled)  Transfers Sit to/from Stand  Sit to Stand Min assist  General transfer comment using UE's effectively and repositions RLE well  Ambulation/Gait  Ambulation/Gait assistance Min guard  Gait Distance (Feet) 45 Feet  Assistive device Rolling walker (2 wheeled)  Gait Pattern/deviations Step-to pattern;Step-through pattern;Decreased stance time - right;Decreased weight shift to right;Wide base of support  General Gait Details pt was assisted to direct walker around furniture, and to use awareness of her limits  Gait velocity reduced  Gait velocity interpretation <1.31 ft/sec, indicative of household ambulator  General stair comments has 4 stairs for home  Balance  Overall balance assessment Needs assistance  Sitting-balance support Feet supported  Sitting balance-Leahy Scale Fair  Standing balance support Bilateral upper extremity supported;During functional activity  Standing balance-Leahy Scale Poor  General Comments  General comments (skin integrity, edema, etc.) Pt is up to side of bed with skin sore from incontinence of urine per pt, changed her pants due  to purwick leak   Exercises  Exercises Other exercises (R hip 3+ and LLE WFL)  PT - End of Session  Equipment Utilized  During Treatment Gait belt  Activity Tolerance Patient tolerated treatment well  Patient left in chair;with call bell/phone within reach;with nursing/sitter in room  Nurse Communication Mobility status  PT Assessment  PT Recommendation/Assessment Patient needs continued PT services  PT Visit Diagnosis Unsteadiness on feet (R26.81);Other abnormalities of gait and mobility (R26.89);Muscle weakness (generalized) (M62.81);Pain  Pain - Right/Left Right  Pain - part of body Hip  PT Problem List Decreased strength;Decreased range of motion;Decreased activity tolerance;Decreased balance;Decreased mobility;Decreased coordination;Decreased knowledge of use of DME;Decreased safety awareness;Pain  Barriers to Discharge Inaccessible home environment  Barriers to Discharge Comments 4 steps to enter house  PT Plan  PT Frequency (ACUTE ONLY) Min 3X/week  PT Treatment/Interventions (ACUTE ONLY) DME instruction;Gait training;Stair training;Functional mobility training;Therapeutic activities;Therapeutic exercise;Balance training;Neuromuscular re-education;Patient/family education  AM-PAC PT "6 Clicks" Mobility Outcome Measure (Version 2)  Help needed turning from your back to your side while in a flat bed without using bedrails? 3  Help needed moving from lying on your back to sitting on the side of a flat bed without using bedrails? 2  Help needed moving to and from a bed to a chair (including a wheelchair)? 3  Help needed standing up from a chair using your arms (e.g., wheelchair or bedside chair)? 3  Help needed to walk in hospital room? 3  Help needed climbing 3-5 steps with a railing?  2  6 Click Score 16  Consider Recommendation of Discharge To: Home with [facility]  PT Recommendation  Follow Up Recommendations Home health PT;Supervision/Assistance - 24 hour;Supervision for mobility/OOB  PT equipment None recommended by PT  Individuals Consulted  Consulted and Agree with Results and Recommendations Patient   Acute Rehab PT Goals  Patient Stated Goal reduce the edema and go home  PT Goal Formulation With patient  Time For Goal Achievement 06/22/20  Potential to Achieve Goals Good  PT Time Calculation  PT Start Time (ACUTE ONLY) 1207  PT Stop Time (ACUTE ONLY) 1232  PT Time Calculation (min) (ACUTE ONLY) 25 min  PT General Charges  $$ ACUTE PT VISIT 1 Visit  PT Evaluation  $PT Eval Moderate Complexity 1 Mod  PT Treatments  $Gait Training 8-22 mins  Written Expression  Dominant Hand Right    Mee Hives, PT MS Acute Rehab Dept. Number: [place] and [place]

## 2020-06-15 NOTE — Progress Notes (Addendum)
PROGRESS NOTE    Patricia Villegas  VEH:[id number] DOB: 03-26-1935 DOA: 06/14/2020 PCP: Haywood Pao, MD    Brief Narrative:  Patient admitted to the hospital with working diagnosis of symptomatic anemia.  85 year old female with significant past medical history for hypertension, dyslipidemia, type 2 diabetes mellitus, coronary artery disease, chronic anemia, and chronic back pain.  Patient reported significant weakness, fatigue and dyspnea for about 2 weeks.  It was associated with lower extremity edema.  ED visit 10/1, found to have anemia with hemoglobin 7.3, work-up with ultrasonography of lower extremities and chest CT angiography was negative for venous thromboembolism.  On the day of admission she had a follow-up at the cardiology office, and she was referred for further hospitalization.  On her initial physical examination temperature 98, blood pressure 118/43, heart rate 70, respiratory rate 19, oxygen saturation 100% on room air.  Lungs were clear to auscultation bilaterally, heart S1-S2, present and rhythmic, abdomen soft, positive lower extremity edema bilaterally. Sodium 141, potassium 3.8, chloride 106, bicarb 24, glucose 143, BUN 33, creatinine 1.57, white count 9.8, hemoglobin 7.7, hematocrit 24.7, platelets 244.  SARS COVID-19 negative.  Urinalysis negative for infection.  Chest radiograph with increased lung markings bilaterally. EKG 66 bpm, left axis deviation, left bundle branch block, sinus rhythm, Q-wave V1-V2, J-point elevation V1-V3, T wave inversion aVL.  Assessment & Plan:   Principal Problem:   Symptomatic anemia Active Problems:   Hypertension   Hyperlipidemia   Peripheral vascular disease (HCC)   CAD (coronary artery disease) of artery bypass graft   Femoral artery occlusion, bilateral   Rheumatoid arthritis (HCC)   Chronic diastolic CHF (congestive heart failure) ([place])   1. Symptomatic anemia. On anemia of chronic disease. Likely multifactorial including  recent orthopedic surgery to her right hip. Iron panel with serum iron of 62, tibc 300, transferrin saturation 21 and ferritin 156. No clinical signs of active bleeding.   Hgb this am down to 7,1 and Hct at 22,6, despite 1 unit PRBC. Will add 2nd unit PRBC today and will follow up cell count in am. Patient may be a good candidate for EPO injections as outpatient.   2. Peripheral vascular disease/ atherosclerotic vascular disease/ CAD sp CABG/ dyslipidemia. Patient with chronic right lower extremity claudication. On exam normal temperature and skin color. Mild local edema at the ankle on the right.   ABI with critical ischemia to the right lower extremity, ATA, PTA and peroneal arteries appeared to be occluded.   Continue medical therapy with aspirin and statin.   3. Chronic diastolic heart failure. No sings of acute exacerbation, continue blood pressure monitoring.   4. T2DM. Continue sliding scale for glucose cover and monitoring.   5. HTN. Episode of hypotension yesterday, continue to hold on antihypertensive medications for now.   6. RA. Continue with hydroxychloroquine and prednisone.   7. AKI on CKD stage 2. Renal function this am with cr at 1,15 with K at 4,1 and bicarbonate at 23.  Continue close follow up of renal function in am.   Patient continue to be at high risk for worsening symptomatic anemia.   Status is: Inpatient  Remains inpatient appropriate because:IV treatments appropriate due to intensity of illness or inability to take PO.    Dispo:  Patient From: Home  Planned Disposition: Home with [facility] Care [facility]  Expected discharge date: 06/16/20  Medically stable for discharge: No     DVT prophylaxis: Enoxaparin   Code Status:   Partial   Family  Communication:    I spoke over the phone with the patient's son about patient's  condition, plan of care, prognosis and all questions were addressed.     Consultants:   Vascular surgery    Subjective: Patient is  feeling better, after on unit PRBC but not yet back to baseline, she continue to have right leg edema and reports chronic claudication. No active pain at the right leg or foot.   Objective: Vitals:   06/15/20 0450 06/15/20 0455 06/15/20 0821 06/15/20 1101  BP: (!) 111/56  (!) 122/55 (!) 110/49  Pulse: 63  63 66  Resp: 18  15 14   Temp: 98.1 F (36.7 C)  97.7 F (36.5 C) 97.8 F (36.6 C)  TempSrc: Oral  Oral Oral  SpO2: 99%  100% 100%  Weight:  63 kg    Height:        Intake/Output Summary (Last 24 hours) at 06/15/2020 1210 Last data filed at 06/15/2020 0450 Gross per 24 hour  Intake 540 ml  Output 250 ml  Net 290 ml   Filed Weights   06/14/20 2305 06/15/20 0455  Weight: 64.7 kg 63 kg    Examination:   General: Not in pain or dyspnea, deconditioned  Neurology: Awake and alert, non focal  E ENT: mild pallor, no icterus, oral mucosa moist Cardiovascular: No JVD. S1-S2 present, rhythmic, no gallops, rubs, or murmurs. Trace right lower extremity edema at the ankle Pulmonary: positive breath sounds bilaterally, adequate air movement, no wheezing, rhonchi or rales. Gastrointestinal. Abdomen soft and non tender Skin. No rashes/ no discoloration of right foot.  Musculoskeletal: no joint deformities     Data Reviewed: I have personally reviewed following labs and imaging studies  CBC: Recent Labs  Lab 06/08/20 1629 06/09/20 0600 06/14/20 1351 06/15/20 0135  WBC 11.2*  --  9.8 6.6  NEUTROABS 10.2*  --  8.2*  --   HGB 7.7* 7.3* 7.7* 7.1*  HCT 23.3* 22.8* 24.7* 22.6*  MCV 105.0*  --  108.3* 103.2*  PLT 292  --  244 263   Basic Metabolic Panel: Recent Labs  Lab 06/08/20 1629 06/14/20 1351 06/15/20 0135  NA 135 141 139  K 4.3 3.8 4.1  CL 104 106 109  CO2 22 24 23   GLUCOSE 141* 143* 188*  BUN 22 33* 29*  CREATININE 1.29* 1.57* 1.15*  CALCIUM 8.8* 9.0 8.6*  MG  --  2.3  --   PHOS  --  3.7  --    GFR: Estimated Creatinine Clearance: 34.7 mL/min (A) (by C-G  formula based on SCr of 1.15 mg/dL (H)). Liver Function Tests: Recent Labs  Lab 06/08/20 1629 06/14/20 1351  AST 34 34  ALT 21 24  ALKPHOS 48 53  BILITOT 1.0 0.8  PROT 5.9* 5.7*  ALBUMIN 3.1* 3.2*   No results for input(s): LIPASE, AMYLASE in the last 168 hours. No results for input(s): AMMONIA in the last 168 hours. Coagulation Profile: No results for input(s): INR, PROTIME in the last 168 hours. Cardiac Enzymes: No results for input(s): CKTOTAL, CKMB, CKMBINDEX, TROPONINI in the last 168 hours. BNP (last 3 results) Recent Labs    07/19/19 1607 08/31/19 1052  PROBNP 1,614* 980*   HbA1C: Recent Labs    06/14/20 1351  HGBA1C 6.1*   CBG: Recent Labs  Lab 06/14/20 1627 06/14/20 2101 06/15/20 0805 06/15/20 1142  GLUCAP 140* 97 86 131*   Lipid Profile: No results for input(s): CHOL, HDL, LDLCALC, TRIG, CHOLHDL, LDLDIRECT in the  last 72 hours. Thyroid Function Tests: Recent Labs    06/14/20 1351  TSH 4.392   Anemia Panel: Recent Labs    06/14/20 1351  VITAMINB12 471  FOLATE >100.0  FERRITIN 156  TIBC 300  IRON 62      Radiology Studies: I have reviewed all of the imaging during this hospital visit personally     Scheduled Meds: . sodium chloride   Intravenous Once  . sodium chloride   Intravenous Once  . allopurinol  200 mg Oral Daily  . aspirin EC  81 mg Oral BID PC  . atorvastatin  40 mg Oral Daily  . enoxaparin (LOVENOX) injection  30 mg Subcutaneous Q24H  . feeding supplement (ENSURE ENLIVE)  237 mL Oral BID BM  . folic acid  2 mg Oral Daily  . hydroxychloroquine  200 mg Oral BID  . insulin aspart  0-5 Units Subcutaneous QHS  . insulin aspart  0-9 Units Subcutaneous TID WC  . polyethylene glycol  17 g Oral Daily  . predniSONE  5 mg Oral Q breakfast  . senna-docusate  2 tablet Oral BID   Continuous Infusions:   LOS: 0 days        Kazuo Durnil Gerome Apley, MD

## 2020-06-15 NOTE — Progress Notes (Signed)
ABI has been completed.   Preliminary results in CV Proc.   Patricia Villegas 06/15/2020 10:21 AM

## 2020-06-15 NOTE — Progress Notes (Signed)
PT Cancellation Note  Patient Details Name: Patricia Villegas MRN: [id number] DOB: [birth year]   Cancelled Treatment:    Reason Eval/Treat Not Completed: Patient at procedure or test/unavailable.  Retry as time and pt allow.   Ramond Dial 06/15/2020, 10:36 AM  Mee Hives, PT MS Acute Rehab Dept. Number: [place] and [place]

## 2020-06-15 NOTE — Progress Notes (Addendum)
HOSPITAL MEDICINE OVERNIGHT EVENT NOTE    Notified by nursing that patient had one episode of small-volume hematochezia with a mixture of both bright red and dark red blood mixed in her stool.  Patient has no change in symptoms and is hemodynamically stable.  According to nursing patient has been constipated for the past few days.  Chart reviewed, symptomatic anemia is indeed one of the major items addressed during this hospitalization however I am uncertain based on this report of 1 small amount of hematochezia that this is the cause of patient's symptomatic anemia.  Will order repeat hemoglobin and hematocrit to be performed at midnight.  If hemoglobin is dramatically downtrending or if patient is exhibiting recurrent bouts of gross bleeding will consider contacting gastroenterology this evening otherwise we will notify day team for consideration of GI consultation at their discretion.  Patricia Emerald  MD [facility]    ADDENDUM (10/8 3:50AM)  Repeat Hgb 8.7.  Reasonably stable.  No further episodes of bleeding per nursing.  Continue to monitor.  [name] Patricia Villegas

## 2020-06-15 NOTE — Progress Notes (Signed)
Report given to [facility], [place]. Patient is alert and oriented x4 resting in bed with call light in reach. VSS. 1 unit of PRBCs given overnight. ACHS, Hgb 7.1, Hct 22.6, platelets 166, K+ 4.1, Creat 1.15, UA results posted this AM. Patient may need recollect since H&H went down after transfusion. Family updated this AM and overnight of patient's status.

## 2020-06-16 DIAGNOSIS — I5032 Chronic diastolic (congestive) heart failure: Secondary | ICD-10-CM

## 2020-06-16 DIAGNOSIS — I70202 Unspecified atherosclerosis of native arteries of extremities, left leg: Secondary | ICD-10-CM | POA: Diagnosis not present

## 2020-06-16 DIAGNOSIS — K922 Gastrointestinal hemorrhage, unspecified: Secondary | ICD-10-CM

## 2020-06-16 DIAGNOSIS — I25709 Atherosclerosis of coronary artery bypass graft(s), unspecified, with unspecified angina pectoris: Secondary | ICD-10-CM | POA: Diagnosis not present

## 2020-06-16 DIAGNOSIS — D649 Anemia, unspecified: Secondary | ICD-10-CM | POA: Diagnosis not present

## 2020-06-16 LAB — CBC
HCT: 27.1 % — ABNORMAL LOW (ref 36.0–46.0)
Hemoglobin: 8.7 g/dL — ABNORMAL LOW (ref 12.0–15.0)
MCH: 31.9 pg (ref 26.0–34.0)
MCHC: 32.1 g/dL (ref 30.0–36.0)
MCV: 99.3 fL (ref 80.0–100.0)
Platelets: 166 10*3/uL (ref 150–400)
RBC: 2.73 MIL/uL — ABNORMAL LOW (ref 3.87–5.11)
RDW: 20.4 % — ABNORMAL HIGH (ref 11.5–15.5)
WBC: 8.5 10*3/uL (ref 4.0–10.5)
nRBC: 0.2 % (ref 0.0–0.2)

## 2020-06-16 LAB — BPAM RBC
Blood Product Expiration Date: 202110222359
Blood Product Expiration Date: 202110272359
ISSUE DATE / TIME: 202110061820
ISSUE DATE / TIME: 202110071258
Unit Type and Rh: 6200
Unit Type and Rh: 6200

## 2020-06-16 LAB — TYPE AND SCREEN
ABO/RH(D): A POS
Antibody Screen: NEGATIVE
Unit division: 0
Unit division: 0

## 2020-06-16 LAB — BASIC METABOLIC PANEL
Anion gap: 7 (ref 5–15)
BUN: 16 mg/dL (ref 8–23)
CO2: 24 mmol/L (ref 22–32)
Calcium: 8.9 mg/dL (ref 8.9–10.3)
Chloride: 113 mmol/L — ABNORMAL HIGH (ref 98–111)
Creatinine, Ser: 0.87 mg/dL (ref 0.44–1.00)
GFR calc non Af Amer: >60 mL/min (ref >60)
Glucose, Bld: 90 mg/dL (ref 70–99)
Potassium: 4.1 mmol/L (ref 3.5–5.1)
Sodium: 144 mmol/L (ref 135–145)

## 2020-06-16 LAB — GLUCOSE, CAPILLARY
Glucose-Capillary: 69 mg/dL — ABNORMAL LOW (ref 70–99)
Glucose-Capillary: 107 mg/dL — ABNORMAL HIGH (ref 70–99)
Glucose-Capillary: 108 mg/dL — ABNORMAL HIGH (ref 70–99)
Glucose-Capillary: 163 mg/dL — ABNORMAL HIGH (ref 70–99)
Glucose-Capillary: 123 mg/dL — ABNORMAL HIGH (ref 70–99)

## 2020-06-16 LAB — HEMOGLOBIN: Hemoglobin: 10 g/dL — ABNORMAL LOW (ref 12.0–15.0)

## 2020-06-16 MED ORDER — PEG-KCL-NACL-NASULF-NA ASC-C 100 G PO SOLR
0.5000 | Freq: Once | ORAL | Status: AC
  Administered 2020-06-16: 100 g via ORAL

## 2020-06-16 MED ORDER — POLYETHYLENE GLYCOL 3350 17 G PO PACK
51.0000 g | PACK | Freq: Once | ORAL | Status: AC
  Administered 2020-06-16: 51 g via ORAL
  Filled 2020-06-16: qty 3

## 2020-06-16 MED ORDER — PEG-KCL-NACL-NASULF-NA ASC-C 100 G PO SOLR
0.5000 | Freq: Once | ORAL | Status: AC
  Administered 2020-06-16: 100 g via ORAL
  Filled 2020-06-16: qty 1

## 2020-06-16 MED ORDER — PANTOPRAZOLE SODIUM 40 MG IV SOLR
40.0000 mg | Freq: Two times a day (BID) | INTRAVENOUS | Status: DC
  Administered 2020-06-16 (×2): 40 mg via INTRAVENOUS
  Filled 2020-06-16 (×2): qty 40

## 2020-06-16 MED ORDER — SODIUM CHLORIDE 0.9 % IV SOLN: INTRAVENOUS | Status: DC

## 2020-06-16 MED ORDER — PEG-KCL-NACL-NASULF-NA ASC-C 100 G PO SOLR: 1.0000 | Freq: Once | ORAL | Status: DC

## 2020-06-16 MED ORDER — PANTOPRAZOLE SODIUM 40 MG PO TBEC
40.0000 mg | DELAYED_RELEASE_TABLET | Freq: Two times a day (BID) | ORAL | Status: DC
  Administered 2020-06-16: 40 mg via ORAL
  Filled 2020-06-16: qty 1

## 2020-06-16 NOTE — Anesthesia Preprocedure Evaluation (Addendum)
Anesthesia Evaluation  Patient identified by MRN, date of birth, ID band Patient awake    Reviewed: Allergy & Precautions, NPO status , Patient's Chart, lab work & pertinent test results  History of Anesthesia Complications (+) history of anesthetic complications  Airway Mallampati: II  TM Distance: >3 FB Neck ROM: Full    Dental  (+) Dental Advisory Given, Partial Upper   Pulmonary former smoker,    Pulmonary exam normal        Cardiovascular hypertension, Pt. on home beta blockers and Pt. on medications + CAD, + CABG, + Peripheral Vascular Disease and +CHF  Normal cardiovascular exam   '20 TTE - EF 60 to 65%. Grade II diastolic dysfunction (pseudonormalization). Left atrial size was mildly dilated. Mild MR and TR. Trivial AI. Mild to moderate aortic valve sclerosis/calcification without any evidence of aortic stenosis. There is mild dilatation of the ascending aorta measuring 42 mm.  Moderately elevated pulmonary artery systolic pressure at 70DUKR.   '20 Carotid US - 1-39% b/l ICAS   Neuro/Psych PSYCHIATRIC DISORDERS Anxiety Depression  Neuromuscular disease (neuropathy)    GI/Hepatic Neg liver ROS, hiatal hernia, GERD  ,  Endo/Other  diabetes, Type 2, Insulin Dependent  Renal/GU negative Renal ROS     Musculoskeletal  (+) Arthritis , Rheumatoid disorders,    Abdominal   Peds  Hematology  (+) anemia ,  Thrombocytopenia, Plt 135k    Anesthesia Other Findings Covid test negative   Reproductive/Obstetrics                            Anesthesia Physical Anesthesia Plan  ASA: III  Anesthesia Plan: MAC   Post-op Pain Management:    Induction: Intravenous  PONV Risk Score and Plan: 2 and Propofol infusion and Treatment may vary due to age or medical condition  Airway Management Planned: Nasal Cannula and Natural Airway  Additional Equipment: None  Intra-op Plan:   Post-operative  Plan:   Informed Consent: I have reviewed the patients History and Physical, chart, labs and discussed the procedure including the risks, benefits and alternatives for the proposed anesthesia with the patient or authorized representative who has indicated his/her understanding and acceptance.   Patient has DNR.  Discussed DNR with patient and Suspend DNR.     Plan Discussed with: CRNA and Anesthesiologist  Anesthesia Plan Comments:       Anesthesia Quick Evaluation

## 2020-06-16 NOTE — Progress Notes (Addendum)
PROGRESS NOTE    Patricia Villegas  XVQ:[id number] DOB: 1934/11/02 DOA: 06/14/2020 PCP: Haywood Pao, MD    Brief Narrative:  Patient admitted to the hospital with working diagnosis of symptomatic anemia, multifactorial combined with acute blood loss due to upper GI bleed.  85 year old female with significant past medical history for hypertension, dyslipidemia, type 2 diabetes mellitus, coronary artery disease, chronic anemia, and chronic back pain.  Patient reported significant weakness, fatigue and dyspnea for about 2 weeks.  It was associated with lower extremity edema.  ED visit 10/1, found to have anemia with hemoglobin 7.3, work-up with ultrasonography of lower extremities and chest CT angiography was negative for venous thromboembolism.  On the day of admission she had a follow-up at the cardiology office, and she was referred for further hospitalization.  On her initial physical examination temperature 98, blood pressure 118/43, heart rate 70, respiratory rate 19, oxygen saturation 100% on room air.  Lungs were clear to auscultation bilaterally, heart S1-S2, present and rhythmic, abdomen soft, positive lower extremity edema bilaterally. Sodium 141, potassium 3.8, chloride 106, bicarb 24, glucose 143, BUN 33, creatinine 1.57, white count 9.8, hemoglobin 7.7, hematocrit 24.7, platelets 244.  SARS COVID-19 negative.  Urinalysis negative for infection.  Chest radiograph with increased lung markings bilaterally. EKG 66 bpm, left axis deviation, left bundle branch block, sinus rhythm, Q-wave V1-V2, J-point elevation V1-V3, T wave inversion aVL.  Patient received 2 units of PRBC with good toleration, last nigh and this am with positive melena and hematochezia.   Added bid pantoprazole and will consult gastroenterology for possible endoscopy.    Assessment & Plan:   Principal Problem:   Symptomatic anemia Active Problems:   Hypertension   Hyperlipidemia   Peripheral vascular disease  (HCC)   CAD (coronary artery disease) of artery bypass graft   Femoral artery occlusion, bilateral   Rheumatoid arthritis (HCC)   Chronic diastolic CHF (congestive heart failure) ([place])    1. Symptomatic anemia. On anemia of chronic disease. Acute blood loss anemia due to upper GI bleed.  Recent orthopedic surgery to her right hip. Iron panel with serum iron of 62, tibc 300, transferrin saturation 21 and ferritin 156. Positive melena and hematochezia since last night, hgb this am at 8,7 after 2 units of PRBC.   Patient high pre-test probability for peptic ulcer disease, considering recent bone fracture and orthopedic surgery. Start proton pump inhibition with pantoprazole 40 mg po bid and hold on aspirin for now.  I discuss with Mr. Hendricksen further work up with upper endoscopy and she agrees, will consult GI. Patient had colonoscopy in the past many years ago at her primary care physician.   2. Peripheral vascular disease/ atherosclerotic vascular disease/ CAD sp CABG/ dyslipidemia. Chronic right lower extremity claudication. ABI with critical ischemia to the right lower extremity, ATA, PTA and peroneal arteries appeared to be occluded.   No clinical signs of acute ischemic limb, patient has decided to follow up as outpatient with vascular surgery. Continue with medical therapy with statin, hold on aspirin for now due to possible GI bleeding.  3. Chronic diastolic heart failure. No acute exacerbation, continue blood pressure monitoring.  Continue to hold on diuretic therapy for now.   4. T2DM. On sliding scale for glucose cover and monitoring.   5. HTN. Blood pressure this am 093 mmHg systolic, will continue close monitoring, holding on antihypertensive medications.  6. RA. On hydroxychloroquine and prednisone.   7. AKI on CKD stage 2. Continue to improve  renal function with serum cr this am at 0,87 with K at 4,1 and bicarbonate at 24.   Patient continue to be at high risk for  worsening anemia.   Status is: Inpatient  Remains inpatient appropriate because:Inpatient level of care appropriate due to severity of illness   Dispo:  Patient From: Home  Planned Disposition: Home with Health Care Svc  Expected discharge date: 06/17/20  Medically stable for discharge: No Pending GI evaluation     DVT prophylaxis: Patient out of bed, hold on enoxaparin due to GI bleeding.  Code Status:   Partial   Family Communication:  I spoke over the phone with the patient's son about patient's  condition, plan of care, prognosis and all questions were addressed.     Consultants:   Vascular surgery   GI    Subjective: Patient with positive melena and hematochezia since last night, no chest pain, no nausea or vomiting, weakness and dyspnea have improved,.   Objective: Vitals:   06/15/20 2320 06/16/20 0231 06/16/20 0430 06/16/20 0648  BP: (!) 123/53   128/66  Pulse: 60   60  Resp: 17   16  Temp: (!) 97.4 F (36.3 C)   (!) 97.3 F (36.3 C)  TempSrc: Oral   Oral  SpO2: 100%  99% 100%  Weight:  63 kg    Height:        Intake/Output Summary (Last 24 hours) at 06/16/2020 1024 Last data filed at 06/16/2020 0200 Gross per 24 hour  Intake 365 ml  Output 600 ml  Net -235 ml   Filed Weights   06/14/20 2305 06/15/20 0455 06/16/20 0231  Weight: 64.7 kg 63 kg 63 kg    Examination:   General: Not in pain or dyspnea, deconditioned  Neurology: Awake and alert, non focal  E ENT: no pallor, no icterus, oral mucosa moist Cardiovascular: No JVD. S1-S2 present, rhythmic, no gallops, rubs, or murmurs. Trace right  lower extremity edema. Pulmonary: positive breath sounds bilaterally, adequate air movement, no wheezing, rhonchi or rales. Gastrointestinal. Abdomen soft and non tender Skin. No rashes Musculoskeletal: no joint deformities     Data Reviewed: I have personally reviewed following labs and imaging studies  CBC: Recent Labs  Lab 06/14/20 1351  06/15/20 0135 06/15/20 1844 06/16/20 0158  WBC 9.8 6.6  --  8.5  NEUTROABS 8.2*  --   --   --   HGB 7.7* 7.1* 9.0* 8.7*  HCT 24.7* 22.6* 27.8* 27.1*  MCV 108.3* 103.2*  --  99.3  PLT 244 166  --  673   Basic Metabolic Panel: Recent Labs  Lab 06/14/20 1351 06/15/20 0135 06/16/20 0158  NA 141 139 144  K 3.8 4.1 4.1  CL 106 109 113*  CO2 24 23 24   GLUCOSE 143* 188* 90  BUN 33* 29* 16  CREATININE 1.57* 1.15* 0.87  CALCIUM 9.0 8.6* 8.9  MG 2.3  --   --   PHOS 3.7  --   --    GFR: Estimated Creatinine Clearance: 45.9 mL/min (by C-G formula based on SCr of 0.87 mg/dL). Liver Function Tests: Recent Labs  Lab 06/14/20 1351  AST 34  ALT 24  ALKPHOS 53  BILITOT 0.8  PROT 5.7*  ALBUMIN 3.2*   No results for input(s): LIPASE, AMYLASE in the last 168 hours. No results for input(s): AMMONIA in the last 168 hours. Coagulation Profile: No results for input(s): INR, PROTIME in the last 168 hours. Cardiac Enzymes: No results for input(s):  CKTOTAL, CKMB, CKMBINDEX, TROPONINI in the last 168 hours. BNP (last 3 results) Recent Labs    07/19/19 1607 08/31/19 1052  PROBNP 1,614* 980*   HbA1C: Recent Labs    06/14/20 1351  HGBA1C 6.1*   CBG: Recent Labs  Lab 06/15/20 1142 06/15/20 1646 06/15/20 2145 06/16/20 0801 06/16/20 0841  GLUCAP 131* 114* 144* 69* 107*   Lipid Profile: No results for input(s): CHOL, HDL, LDLCALC, TRIG, CHOLHDL, LDLDIRECT in the last 72 hours. Thyroid Function Tests: Recent Labs    06/14/20 1351  TSH 4.392   Anemia Panel: Recent Labs    06/14/20 1351  VITAMINB12 471  FOLATE >100.0  FERRITIN 156  TIBC 300  IRON 62      Radiology Studies: I have reviewed all of the imaging during this hospital visit personally     Scheduled Meds: . sodium chloride   Intravenous Once  . sodium chloride   Intravenous Once  . allopurinol  200 mg Oral Daily  . aspirin EC  81 mg Oral BID PC  . atorvastatin  40 mg Oral Daily  . enoxaparin  (LOVENOX) injection  30 mg Subcutaneous Q24H  . feeding supplement (ENSURE ENLIVE)  237 mL Oral BID BM  . folic acid  2 mg Oral Daily  . hydroxychloroquine  200 mg Oral BID  . insulin aspart  0-5 Units Subcutaneous QHS  . insulin aspart  0-9 Units Subcutaneous TID WC  . polyethylene glycol  17 g Oral Daily  . predniSONE  5 mg Oral Q breakfast  . senna-docusate  2 tablet Oral BID   Continuous Infusions:   LOS: 1 day        Tanica Gaige Gerome Apley, MD

## 2020-06-16 NOTE — Evaluation (Signed)
Occupational Therapy Evaluation Patient Details Name: Patricia Villegas MRN: [id number] DOB: 1935-07-03 Today's Date: 06/16/2020    History of Present Illness 85 yo female with onset of BLE edema and weakness was sent to hosp by her cardiologist, and cleared for DVT and PE. Has anemia at 7.1 after being transfused, another transfusion planned.  Recent R THA with direct anterior approach and WBAT, about 2 weeks ago.  SOB but not with PT.  PMHx:  HTN, back pain, CHF, CAD with CABG, [name], DM, PN, depression, lumbar surgeries, L THA   Clinical Impression   Pt. Has decreased I and safety with adls and mobility. Pt. Will need further ed on use of ae for le adls. Pt. Is motivated to work with ot in hospital and return home with hhot   Follow Up Recommendations  Home health OT    Equipment Recommendations  None recommended by OT    Recommendations for Other Services       Precautions / Restrictions Precautions Precautions: Fall Precaution Comments: Direct anterior approach R THA Restrictions Weight Bearing Restrictions: No      Mobility Bed Mobility Overal bed mobility: Needs Assistance Bed Mobility: Supine to Sit     Supine to sit: Min assist     General bed mobility comments: assisted l le  Transfers Overall transfer level: Needs assistance Equipment used: Rolling walker (2 wheeled) Transfers: Sit to/from Stand Sit to Stand: Min guard         General transfer comment: Pt. using proper hand placemetn for sit to stand    Balance     Sitting balance-Leahy Scale: Good       Standing balance-Leahy Scale: Fair                             ADL either performed or assessed with clinical judgement   ADL Overall ADL's : Needs assistance/impaired Eating/Feeding: Independent   Grooming: Wash/dry hands;Wash/dry face;Supervision/safety;Standing   Upper Body Bathing: Set up;Sitting   Lower Body Bathing: Minimal assistance;Sit to/from stand   Upper Body  Dressing : Set up;Sitting   Lower Body Dressing: Moderate assistance;Sit to/from stand   Toilet Transfer: Min guard;BSC   Toileting- Water quality scientist and Hygiene: Min guard       Functional mobility during ADLs: Min guard;Rolling walker General ADL Comments: Pt. required assist wtih le adls and needs further ed on ae.      Vision Baseline Vision/History: Wears glasses Wears Glasses: Reading only Patient Visual Report: No change from baseline       Perception     Praxis      Pertinent Vitals/Pain Pain Assessment: 0-10 Pain Score: 3  Pain Location: right hip Pain Descriptors / Indicators: Aching Pain Intervention(s): Limited activity within patient's tolerance;Monitored during session (pt. does not want pain medicine)     Hand Dominance Right   Extremity/Trunk Assessment Upper Extremity Assessment Upper Extremity Assessment: Generalized weakness   Lower Extremity Assessment Lower Extremity Assessment: Defer to PT evaluation   Cervical / Trunk Assessment Cervical / Trunk Assessment: Kyphotic   Communication Communication Communication: No difficulties   Cognition Arousal/Alertness: Awake/alert Behavior During Therapy: WFL for tasks assessed/performed Overall Cognitive Status: Within Functional Limits for tasks assessed                                     General Comments  Exercises     Shoulder Instructions      Home Living Family/patient expects to be discharged to:: Private residence Living Arrangements: Spouse/significant other Available Help at Discharge: Family;Available 24 hours/day Type of Home: House Home Access: Stairs to enter [employer] of Steps: 4 Entrance Stairs-Rails: Can reach both Home Layout: Able to live on main level with bedroom/bathroom     Bathroom Shower/Tub: Occupational psychologist: Standard     Home Equipment: Environmental consultant - 2 wheels;Cane - single point;Bedside commode;Shower  seat          Prior Functioning/Environment Level of Independence: Independent with assistive device(s)        Comments: prior to sx for hip         OT Problem List: Decreased activity tolerance;Decreased knowledge of use of DME or AE      OT Treatment/Interventions: Self-care/ADL training;DME and/or AE instruction;Therapeutic activities;Patient/family education    OT Goals(Current goals can be found in the care plan section) Acute Rehab OT Goals Patient Stated Goal: to go home OT Goal Formulation: With patient Time For Goal Achievement: 06/30/20 Potential to Achieve Goals: Good ADL Goals Pt Will Perform Grooming: with modified independence;standing Pt Will Perform Lower Body Bathing: with modified independence;sit to/from stand Pt Will Perform Lower Body Dressing: with modified independence;sit to/from stand Pt Will Transfer to Toilet: with modified independence;ambulating Pt Will Perform Toileting - Clothing Manipulation and hygiene: with modified independence;sit to/from stand Pt Will Perform Tub/Shower Transfer: with modified independence;ambulating  OT Frequency: Min 2X/week   Barriers to D/C:            Co-evaluation              AM-PAC OT "6 Clicks" Daily Activity     Outcome Measure Help from another person eating meals?: None Help from another person taking care of personal grooming?: A Little Help from another person toileting, which includes using toliet, bedpan, or urinal?: A Little Help from another person bathing (including washing, rinsing, drying)?: A Little Help from another person to put on and taking off regular upper body clothing?: A Little Help from another person to put on and taking off regular lower body clothing?: A Lot 6 Click Score: 18   End of Session Equipment Utilized During Treatment: Rolling walker;Gait belt Nurse Communication:  (ok therapy)  Activity Tolerance: Patient tolerated treatment well Patient left: in chair;with  call bell/phone within reach;with chair alarm set  OT Visit Diagnosis: Unsteadiness on feet (R26.81)                Time: 9150-5697 OT Time Calculation (min): 49 min Charges:  OT General Charges $OT Visit: 1 Visit OT Evaluation $OT Eval Low Complexity: 1 Low OT Treatments $Self Care/Home Management : 8-22 mins $Therapeutic Activity: 8-22 mins Reece Packer OT/L  Tyson Masin 06/16/2020, 11:17 AM

## 2020-06-16 NOTE — Progress Notes (Signed)
Physical Therapy Treatment Patient Details Name: Patricia Villegas MRN: [id number] DOB: 1935-01-22 Today's Date: 06/16/2020    History of Present Illness 85 yo female with onset of BLE edema and weakness was sent to hosp by her cardiologist, and cleared for DVT and PE. Has anemia at 7.1 after being transfused, another transfusion planned.  Recent R THA with direct anterior approach and WBAT, about 2 weeks ago.  SOB but not with PT.  PMHx:  HTN, back pain, CHF, CAD with CABG, [name], DM, PN, depression, lumbar surgeries, L THA    PT Comments    Pt was seen for gait training in a limited session as pt is beginning to prep a colonoscopy.  Pt is getting up with minor help and is able to get around her room with more control of the walker, better standing balance and a greater awareness of her ability to control the walker.  Discussed confined spaces to move walker and will expect her to continue on acutely to work toward safer quality gait to return home.    Follow Up Recommendations  Home health PT;Supervision/Assistance - 24 hour;Supervision for mobility/OOB     Equipment Recommendations  None recommended by PT    Recommendations for Other Services       Precautions / Restrictions Precautions Precautions: Fall Precaution Comments: Direct anterior approach R THA Restrictions Weight Bearing Restrictions: No    Mobility  Bed Mobility Overal bed mobility: Needs Assistance Bed Mobility: Supine to Sit     Supine to sit: Min guard;Min assist     General bed mobility comments: Pt is assisting RLE and able to get to side of bed  Transfers Overall transfer level: Needs assistance Equipment used: Rolling walker (2 wheeled) Transfers: Sit to/from Stand Sit to Stand: Min guard            Ambulation/Gait Ambulation/Gait assistance: Min guard Gait Distance (Feet): 80 Feet Assistive device: Rolling walker (2 wheeled) Gait Pattern/deviations: Step-to pattern;Step-through  pattern;Decreased stance time - right;Decreased weight shift to right;Wide base of support Gait velocity: reduced Gait velocity interpretation: <1.31 ft/sec, indicative of household ambulator General Gait Details: pt is more stable to walk around obstacles and turns with S,   Stairs             Wheelchair Mobility    Modified Rankin (Stroke Patients Only)       Balance     Sitting balance-Leahy Scale: Good     Standing balance support: Bilateral upper extremity supported;During functional activity Standing balance-Leahy Scale: Fair                              Cognition Arousal/Alertness: Awake/alert Behavior During Therapy: WFL for tasks assessed/performed Overall Cognitive Status: Within Functional Limits for tasks assessed                                        Exercises      General Comments General comments (skin integrity, edema, etc.): Pt is more comfortable today, less painful to move and able to get on a nd off bed with help for RLE only      Pertinent Vitals/Pain Pain Assessment: Faces Faces Pain Scale: Hurts little more Pain Location: right hip Pain Descriptors / Indicators: Guarding Pain Intervention(s): Monitored during session;Repositioned    Home Living  Prior Function            PT Goals (current goals can now be found in the care plan section) Acute Rehab PT Goals Patient Stated Goal: to go home Progress towards PT goals: Progressing toward goals    Frequency    Min 3X/week      PT Plan Current plan remains appropriate    Co-evaluation              AM-PAC PT "6 Clicks" Mobility   Outcome Measure  Help needed turning from your back to your side while in a flat bed without using bedrails?: A Little Help needed moving from lying on your back to sitting on the side of a flat bed without using bedrails?: A Little Help needed moving to and from a bed to a chair  (including a wheelchair)?: A Little Help needed standing up from a chair using your arms (e.g., wheelchair or bedside chair)?: A Little Help needed to walk in hospital room?: A Little Help needed climbing 3-5 steps with a railing? : A Lot 6 Click Score: 17    End of Session Equipment Utilized During Treatment: Gait belt Activity Tolerance: Patient tolerated treatment well Patient left: in bed;with call bell/phone within reach;with bed alarm set Nurse Communication: Mobility status PT Visit Diagnosis: Unsteadiness on feet (R26.81);Other abnormalities of gait and mobility (R26.89);Muscle weakness (generalized) (M62.81);Pain Pain - Right/Left: Right Pain - part of body: Hip     Time: 2081-3887 PT Time Calculation (min) (ACUTE ONLY): 12 min  Charges:  $Gait Training: 8-22 mins                   Ramond Dial 06/16/2020, 11:31 PM  Mee Hives, PT MS Acute Rehab Dept. Number: [place] and [place]

## 2020-06-16 NOTE — Consult Note (Signed)
Consultation  Referring Provider: Dr. Cathlean Sauer     Primary Care Physician:  Osborne Casco Fransico Him, MD Primary Gastroenterologist: Althia Forts        Reason for Consultation: Symptomatic anemia with melena and hematochezia            HPI:   Patricia Villegas is a 85 y.o. female with multiple medical problems including CAD status post CABG, hypertension, hyperlipidemia, diabetes, diastolic heart failure, chronic normocytic anemia, chronic back pain post spinal surgeries, and multiple others, who presented to the hospital on 06/14/2020 with fatigue, weakness and leg swelling.  We are consulted today in regards to her worsening anemia with melena and hematochezia overnight.    At time of admission patient was admitted from her cardiologist office due to significant weakness, fatigue and dyspnea without hypoxia for 2 weeks.  She has associated bilateral lower extremity edema which had started a month ago prior to her right hip replacement on 05/30/2020.  Apparently initially presented in ED 06/09/2020 for the same complaints and was found to have a hemoglobin of 7.3, work-up revealed bilateral Doppler ultrasound negative for DVT, CTA PE negative for pulmonary embolism and she is advised to follow-up with PCP.  At that time also complained of constipation with no bowel movement in a week.    Today, the patient explains that about 2 months ago ago she finally decided to get her hip replaced and was put on pain medicine until they can get her scheduled, tells me that over the next 5 weeks while she is waiting for procedure she became weakened due to the pain and inability to really walk around.  She then had her hip replacement on 05/30/2020 and was doing fairly well, but a couple of weeks after this procedure saw the therapist at her home and became very tired and short of breath so much so that she had to sit down in her chair and apparently "passed out", for about 10 minutes.  At this point she proceeded to the ER on  10/1 as above with a negative work-up.  Over the next week or so she became even more weak and proceeded to her cardiologist who then directly admitted her.  Tells me that she had not had a bowel movement since about a week ago until this morning when she had 2 bowel movements.  The first 1 was "black as tar at first and then some bright red blood", and the second 1 was about an hour ago and she tells me that this was dark and thick like Jell-O.  Does describe some intermittent episodes of vomiting at home of the past couple weeks which was "yellow".  Also tells me that she has some indigestion symptoms which they told her were all related to her heart in the past.  Denies being on antireflux meds at home.    Denies any previous GI work-up including colonoscopy or endoscopy.    Denies fever, chills or weight loss.  Past Medical History:  Diagnosis Date  . Anemia   . Anemia 06/14/2020  . Anxiety   . Arthritis   . Blood transfusion   . Chest pain    a. Lexiscan Myoview 12/12:  EF 75%, no ischemia or scar;  b.  Echo 12/12:  EF 60-65%, no sig valvular abnormalities   . Closed left fibular fracture    Casted   . Complication of anesthesia 1998   Unable to arouse fully, respiratory depression, pt. reports that the family  was told that she would need a trach., but then she stabilized & didn't need it   . Coronary artery disease    sees Dr Aundra Dubin every 6 months  . Depression   . Diabetes mellitus    Greater than 20 years type 2   . GERD (gastroesophageal reflux disease)   . H/O hiatal hernia   . Hypercholesteremia   . Hypertension    Greater than 20 years  . Lumbar disc disease    S/p surgery x4;  Sciatica   . Neuropathy due to secondary diabetes ([place])    Hx: of  . Parastomal hernia of ileal conduit 2009  . Pneumonia    s/p CABG  . [name] (peripheral vascular disease) ([facility])    Stable claudication. Arteriogram 07/2009 no significant aortoiliac disease, has bilateral SFA occlusions with distal  reconstitution   . Rheumatoid arthritis ([place])    Dr Berna Bue @ [facility]  . Transfusion reaction 1960   Convulsions (? seizure)  . UTI (lower urinary tract infection) 01/03/15    Past Surgical History:  Procedure Laterality Date  . ABDOMINAL HYSTERECTOMY    . BACK SURGERY     4 back operations most recently 1995, 1998  . CARDIAC CATHETERIZATION     Hx: of 05/14/13  . CATARACT EXTRACTION W/ INTRAOCULAR LENS  IMPLANT, BILATERAL     Hx: of  . COLONOSCOPY     Hx; of  . CORONARY ARTERY BYPASS GRAFT N/A 05/24/2013   Procedure: CORONARY ARTERY BYPASS GRAFTING (CABG);  Surgeon: Melrose Nakayama, MD;  Location: [facility];  Service: Open Heart Surgery;  Laterality: N/A;  . DILATION AND CURETTAGE OF UTERUS    . HERNIA REPAIR  2009  . LEFT HEART CATHETERIZATION WITH CORONARY ANGIOGRAM N/A 05/14/2013   Procedure: LEFT HEART CATHETERIZATION WITH CORONARY ANGIOGRAM;  Surgeon: Larey Dresser, MD;  Location: [facility] CATH LAB;  Service: Cardiovascular;  Laterality: N/A;  . TONSILLECTOMY    . TOTAL HIP ARTHROPLASTY Left 01/10/2015   Procedure: TOTAL HIP ARTHROPLASTY ANTERIOR APPROACH;  Surgeon: Melrose Nakayama, MD;  Location: [facility];  Service: Orthopedics;  Laterality: Left;  . TOTAL HIP ARTHROPLASTY Right 05/30/2020   Procedure: RIGHT TOTAL HIP ARTHROPLASTY ANTERIOR APPROACH;  Surgeon: Melrose Nakayama, MD;  Location: [facility];  Service: Orthopedics;  Laterality: Right;    Family History  Problem Relation Age of Onset  . Coronary artery disease Mother 18  . Diabetes Mother   . Stroke Mother   . Heart attack Mother   . Cancer Brother   . Heart disease Brother   . Stroke Brother   . Kidney disease Brother   . Cancer Sister   . Diabetes Brother      Social History   Tobacco Use  . Smoking status: Former Smoker    Packs/day: 0.50    Years: 10.00    Pack years: 5.00    Types: Cigarettes    Quit date: 09/09/1996    Years since quitting: 23.7  . Smokeless tobacco: Current User    Types: Chew   Vaping Use  . Vaping Use: Never used  Substance Use Topics  . Alcohol use: No  . Drug use: No    Prior to Admission medications   Medication Sig Start Date End Date Taking? Authorizing Provider  allopurinol (ZYLOPRIM) 100 MG tablet Take 200 mg by mouth daily.  10/26/19  Yes [provider]  aspirin EC 81 MG tablet Take 1 tablet (81 mg total) by mouth 2 (two)  times daily after a meal. 05/30/20  Yes Nida, Mitzi Hansen, PA-C  benazepril (LOTENSIN) 20 MG tablet Take 20 mg by mouth daily. 08/02/13  Yes Larey Dresser, MD  folic acid (FOLVITE) 1 MG tablet Take 2 mg by mouth daily. 05/10/20  Yes [provider]  furosemide (LASIX) 20 MG tablet Take 20 mg by mouth daily.    Yes [provider]  furosemide (LASIX) 40 MG tablet Take 40 mg by mouth.   Yes [provider]  HYDROcodone-acetaminophen (NORCO/VICODIN) 5-325 MG tablet Take 1-2 tablets by mouth every 6 (six) hours as needed for moderate pain or severe pain. 05/30/20  Yes Loni Dolly, PA-C  hydroxychloroquine (PLAQUENIL) 200 MG tablet Take 200 mg by mouth 2 (two) times daily. 10/26/19  Yes [provider]  LANTUS SOLOSTAR 100 UNIT/ML Solostar Pen Inject 10 Units into the skin every evening.  09/01/19  Yes [provider]  metoprolol tartrate (LOPRESSOR) 25 MG tablet Take 0.5 tablets (12.5 mg total) by mouth 2 (two) times daily. 07/19/19  Yes Sande Rives E, PA-C  ONE TOUCH ULTRA TEST test strip  06/24/13  Yes [provider]  predniSONE (DELTASONE) 5 MG tablet Take 5 mg by mouth daily with breakfast.    Yes [provider]  senna-docusate (SENOKOT-S) 8.6-50 MG tablet Take 1 tablet by mouth daily as needed for mild constipation.   Yes [provider]  simvastatin (ZOCOR) 80 MG tablet Take 80 mg by mouth at bedtime.     Yes [provider]  spironolactone (ALDACTONE) 25 MG tablet Take 0.5 tablets (12.5 mg total) by mouth daily. 07/28/19  Yes Belva Crome, MD   tiZANidine (ZANAFLEX) 2 MG tablet Take 1-2 tablets (2-4 mg total) by mouth every 6 (six) hours as needed for muscle spasms. 05/30/20 05/30/21 Yes Loni Dolly, PA-C    Current Facility-Administered Medications  Medication Dose Route Frequency Provider Last Rate Last Admin  . 0.9 %  sodium chloride infusion (Manually program via Guardrails IV Fluids)   Intravenous Once Hall, Carole N, DO      . 0.9 %  sodium chloride infusion (Manually program via Guardrails IV Fluids)   Intravenous Once Arrien, Jimmy Picket, MD      . acetaminophen (TYLENOL) tablet 650 mg  650 mg Oral Q6H PRN Irene Pap N, DO      . allopurinol (ZYLOPRIM) tablet 200 mg  200 mg Oral Daily Irene Pap N, DO   200 mg at 06/16/20 8756  . atorvastatin (LIPITOR) tablet 40 mg  40 mg Oral Daily Irene Pap N, DO   40 mg at 06/16/20 4332  . feeding supplement (ENSURE ENLIVE) (ENSURE ENLIVE) liquid 237 mL  237 mL Oral BID BM Hall, Carole N, DO   237 mL at 06/16/20 0918  . folic acid (FOLVITE) tablet 2 mg  2 mg Oral Daily Paisley, Carole N, DO   2 mg at 06/16/20 9518  . hydroxychloroquine (PLAQUENIL) tablet 200 mg  200 mg Oral BID Irene Pap N, DO   200 mg at 06/16/20 8416  . insulin aspart (novoLOG) injection 0-5 Units  0-5 Units Subcutaneous QHS Hall, Carole N, DO      . insulin aspart (novoLOG) injection 0-9 Units  0-9 Units Subcutaneous TID WC Irene Pap N, DO   1 Units at 06/15/20 1243  . ondansetron (ZOFRAN) injection 4 mg  4 mg Intravenous Q6H PRN Hall, Carole N, DO      . pantoprazole (PROTONIX) EC tablet 40 mg  40 mg Oral BID Arrien, Jimmy Picket, MD      . polyethylene glycol [facility] / Floria Raveling) packet 17 g  17 g Oral Daily Irene Pap N, DO   17 g at 06/15/20 1043  . predniSONE (DELTASONE) tablet 5 mg  5 mg Oral Q breakfast Irene Pap N, DO   5 mg at 06/16/20 6301  . senna-docusate (Senokot-S) tablet 2 tablet  2 tablet Oral BID Kayleen Memos, DO   2 tablet at 06/16/20 6010    Allergies as of 06/14/2020  . (No  Known Allergies)    Review of Systems:    Constitutional: No weight loss, fever or chills Skin: No rash Cardiovascular: No chest pain Respiratory: No SOB  Gastrointestinal: See HPI and otherwise negative Genitourinary: No dysuria  Neurological: No headache, dizziness or syncope Musculoskeletal: No new muscle or joint pain Hematologic: + bruising and bleeding Psychiatric: No history of depression or anxiety    Physical Exam:  Vital signs in last 24 hours: Temp:  [97.3 F (36.3 C)-98.6 F (37 C)] 97.3 F (36.3 C) (10/08 0648) Pulse Rate:  [60-79] 60 (10/08 0648) Resp:  [16-19] 16 (10/08 0648) BP: (117-131)/(52-98) 128/66 (10/08 0648) SpO2:  [98 %-100 %] 100 % (10/08 0648) Weight:  [93 kg] 63 kg (10/08 0231) Last BM Date: 06/15/20 General:   Pleasant Elderly Caucasian female appears to be in NAD, Well developed, Well nourished, alert and cooperative Head:  Normocephalic and atraumatic. Eyes:   PEERL, EOMI. No icterus. Conjunctiva pink. Ears:  Normal auditory acuity. Neck:  Supple Throat: Oral cavity and pharynx without inflammation, swelling or lesion.  Lungs: Respirations even and unlabored. Lungs clear to auscultation bilaterally.   No wheezes, crackles, or rhonchi.  Heart: Normal S1, S2. No MRG. Regular rate and rhythm. No peripheral edema, cyanosis or pallor.  Abdomen:  Soft, nondistended, nontender. No rebound or guarding. Normal bowel sounds. No appreciable masses or hepatomegaly. Rectal:  Not performed.  Msk:  Symmetrical without gross deformities. Peripheral pulses intact.  Extremities:  Without edema, no deformity or joint abnormality.  Neurologic:  Alert and  oriented x4;  grossly normal neurologically.   Skin:   +bruising Psychiatric: Demonstrates good judgement and reason without abnormal affect or behaviors.   LAB RESULTS: Recent Labs    06/14/20 1351 06/14/20 1351 06/15/20 0135 06/15/20 1844 06/16/20 0158  WBC 9.8  --  6.6  --  8.5  HGB 7.7*   < > 7.1*  9.0* 8.7*  HCT 24.7*   < > 22.6* 27.8* 27.1*  PLT 244  --  166  --  166   < > = values in this interval not displayed.   BMET Recent Labs    06/14/20 1351 06/15/20 0135 06/16/20 0158  NA 141 139 144  K 3.8 4.1 4.1  CL 106 109 113*  CO2 24 23 24   GLUCOSE 143* 188* 90  BUN 33* 29* 16  CREATININE 1.57* 1.15* 0.87  CALCIUM 9.0 8.6* 8.9   LFT Recent Labs    06/14/20 1351  PROT 5.7*  ALBUMIN 3.2*  AST 34  ALT 24  ALKPHOS 53  BILITOT 0.8    STUDIES: DG CHEST PORT 1 VIEW  Result Date: 06/14/2020 CLINICAL DATA:  85 year old female with increasing shortness of breath and weakness. Hip surgery last month. EXAM: PORTABLE CHEST 1 VIEW COMPARISON:  CTA chest 06/09/2020 and earlier. FINDINGS: Portable AP semi upright view at 1410 hours. Lung volumes and mediastinal contours remain normal. Prior CABG. Mild chronic increased  interstitial markings are stable. Otherwise Allowing for portable technique the lungs are clear. No pneumothorax or pleural effusion. No acute osseous abnormality identified. IMPRESSION: No acute cardiopulmonary abnormality. Electronically Signed   By: Genevie Ann M.D.   On: 06/14/2020 14:23   VAS [place] ABI WITH/WO TBI  Result Date: 06/15/2020 LOWER EXTREMITY DOPPLER STUDY Indications: Claudication. High Risk         Hypertension, hyperlipidemia, Diabetes, coronary artery Factors:          disease.  Comparison Study: no prior Performing Technologist: Abram Sander RVS  Examination Guidelines: A complete evaluation includes at minimum, Doppler waveform signals and systolic blood pressure reading at the level of bilateral brachial, anterior tibial, and posterior tibial arteries, when vessel segments are accessible. Bilateral testing is considered an integral part of a complete examination. Photoelectric Plethysmograph (PPG) waveforms and toe systolic pressure readings are included as required and additional duplex testing as needed. Limited examinations for reoccurring indications  may be performed as noted.  ABI Findings: +--------+------------------+-----+---------+--------+ Right   Rt Pressure (mmHg)IndexWaveform Comment  +--------+------------------+-----+---------+--------+ [id number]                    triphasic         +--------+------------------+-----+---------+--------+ PTA                            absent            +--------+------------------+-----+---------+--------+ DP                             absent            +--------+------------------+-----+---------+--------+ +--------+------------------+-----+-------------------+-------+ Left    Lt Pressure (mmHg)IndexWaveform           Comment +--------+------------------+-----+-------------------+-------+ [id number]                    triphasic                  +--------+------------------+-----+-------------------+-------+ PTA     255               2.24 dampened monophasic        +--------+------------------+-----+-------------------+-------+ DP      97                0.85 dampened monophasic        +--------+------------------+-----+-------------------+-------+ +-------+-----------+-----------+------------+------------+ ABI/TBIToday's ABIToday's TBIPrevious ABIPrevious TBI +-------+-----------+-----------+------------+------------+ Right             2.25                                +-------+-----------+-----------+------------+------------+  Summary: Right: Resting right ankle-brachial index indicates critical limb ischemia. ATA, PTA, Peroneal artery all appear to be occluded. Further testing may be warranted. Popliteal artery has monophasic flow. Left: Resting left ankle-brachial index indicates noncompressible left lower extremity arteries.  *See table(s) above for measurements and observations.  Electronically signed by Monica Martinez MD on 06/15/2020 at 3:17:31 PM.   Final      Impression / Plan:   Impression: 1.  Symptomatic anemia, unclear etiology:  10/1 found to have anemia with a hemoglobin of 7.3, at time of admission hemoglobin 7.7--> 2 units PRBCs-->9.0-->8.7, had 1 bowel movement last night with streaks of bright red blood and 2 reported today, the first was black and the second was black with also  some bright red blood, no hemoglobin recheck since 1:00 this morning; consider upper versus lower GI bleed 2.  Generalized weakness: Thought to be multifactorial 3.  Bilateral lower extremity edema 4.  Dyspnea without hypoxia 5.  CAD status post CABG 6.  Chronic diastolic CHF 7.  Constipation: Suspected opiate induced  Plan: 1.  Ordered recheck hemoglobin now 2.  Plans are for EGD and colonoscopy tomorrow morning given that the patient has already eaten today.  Did discuss risks, benefits, limitations and alternatives and patient agrees to proceed. 3.  Movi prep is ordered.  Patient will be on clear liquid diet today and n.p.o. after midnight. 4.  Patient is already on Pantoprazole 40 mg twice daily, would continue this 5.  Please await any further recommendations from Dr. Havery Moros later this afternoon.  Thank you for your kind consultation, we will continue to follow.  Lavone Nian Daylyn Azbill  06/16/2020, 11:03 AM

## 2020-06-16 NOTE — H&P (View-Only) (Signed)
Consultation  Referring Provider: Dr. Cathlean Sauer     Primary Care Physician:  Osborne Casco Fransico Him, MD Primary Gastroenterologist: Althia Forts        Reason for Consultation: Symptomatic anemia with melena and hematochezia            HPI:   Patricia Villegas is a 85 y.o. female with multiple medical problems including CAD status post CABG, hypertension, hyperlipidemia, diabetes, diastolic heart failure, chronic normocytic anemia, chronic back pain post spinal surgeries, and multiple others, who presented to the hospital on 06/14/2020 with fatigue, weakness and leg swelling.  We are consulted today in regards to her worsening anemia with melena and hematochezia overnight.    At time of admission patient was admitted from her cardiologist office due to significant weakness, fatigue and dyspnea without hypoxia for 2 weeks.  She has associated bilateral lower extremity edema which had started a month ago prior to her right hip replacement on 05/30/2020.  Apparently initially presented in ED 06/09/2020 for the same complaints and was found to have a hemoglobin of 7.3, work-up revealed bilateral Doppler ultrasound negative for DVT, CTA PE negative for pulmonary embolism and she is advised to follow-up with PCP.  At that time also complained of constipation with no bowel movement in a week.    Today, the patient explains that about 2 months ago ago she finally decided to get her hip replaced and was put on pain medicine until they can get her scheduled, tells me that over the next 5 weeks while she is waiting for procedure she became weakened due to the pain and inability to really walk around.  She then had her hip replacement on 05/30/2020 and was doing fairly well, but a couple of weeks after this procedure saw the therapist at her home and became very tired and short of breath so much so that she had to sit down in her chair and apparently "passed out", for about 10 minutes.  At this point she proceeded to the ER on  10/1 as above with a negative work-up.  Over the next week or so she became even more weak and proceeded to her cardiologist who then directly admitted her.  Tells me that she had not had a bowel movement since about a week ago until this morning when she had 2 bowel movements.  The first 1 was "black as tar at first and then some bright red blood", and the second 1 was about an hour ago and she tells me that this was dark and thick like Jell-O.  Does describe some intermittent episodes of vomiting at home of the past couple weeks which was "yellow".  Also tells me that she has some indigestion symptoms which they told her were all related to her heart in the past.  Denies being on antireflux meds at home.    Denies any previous GI work-up including colonoscopy or endoscopy.    Denies fever, chills or weight loss.  Past Medical History:  Diagnosis Date   Anemia    Anemia 06/14/2020   Anxiety    Arthritis    Blood transfusion    Chest pain    a. Lexiscan Myoview 12/12:  EF 75%, no ischemia or scar;  b.  Echo 12/12:  EF 60-65%, no sig valvular abnormalities    Closed left fibular fracture    Casted    Complication of anesthesia 1998   Unable to arouse fully, respiratory depression, pt. reports that the family  was told that she would need a trach., but then she stabilized & didn't need it    Coronary artery disease    sees Dr Aundra Dubin every 6 months   Depression    Diabetes mellitus    Greater than 20 years type 2    GERD (gastroesophageal reflux disease)    H/O hiatal hernia    Hypercholesteremia    Hypertension    Greater than 20 years   Lumbar disc disease    S/p surgery x4;  Sciatica    Neuropathy due to secondary diabetes ([place])    Hx: of   Parastomal hernia of ileal conduit 2009   Pneumonia    s/p CABG   [name] (peripheral vascular disease) ([facility])    Stable claudication. Arteriogram 07/2009 no significant aortoiliac disease, has bilateral SFA occlusions with distal  reconstitution    Rheumatoid arthritis ([place])    Dr Berna Bue @ [facility]   Transfusion reaction 1960   Convulsions (? seizure)   UTI (lower urinary tract infection) 01/03/15    Past Surgical History:  Procedure Laterality Date   ABDOMINAL HYSTERECTOMY     BACK SURGERY     4 back operations most recently [place]     Hx: of 05/14/13   CATARACT EXTRACTION W/ INTRAOCULAR LENS  IMPLANT, BILATERAL     Hx: of   COLONOSCOPY     Hx; of   CORONARY ARTERY BYPASS GRAFT N/A 05/24/2013   Procedure: CORONARY ARTERY BYPASS GRAFTING (CABG);  Surgeon: Melrose Nakayama, MD;  Location: [place];  Service: Open Heart Surgery;  Laterality: N/A;   DILATION AND CURETTAGE OF UTERUS     HERNIA REPAIR  2009   LEFT HEART CATHETERIZATION WITH CORONARY ANGIOGRAM N/A 05/14/2013   Procedure: LEFT HEART CATHETERIZATION WITH CORONARY ANGIOGRAM;  Surgeon: Larey Dresser, MD;  Location: [facility] CATH LAB;  Service: Cardiovascular;  Laterality: N/A;   TONSILLECTOMY     TOTAL HIP ARTHROPLASTY Left 01/10/2015   Procedure: TOTAL HIP ARTHROPLASTY ANTERIOR APPROACH;  Surgeon: Melrose Nakayama, MD;  Location: [place];  Service: Orthopedics;  Laterality: Left;   TOTAL HIP ARTHROPLASTY Right 05/30/2020   Procedure: RIGHT TOTAL HIP ARTHROPLASTY ANTERIOR APPROACH;  Surgeon: Melrose Nakayama, MD;  Location: [facility];  Service: Orthopedics;  Laterality: Right;    Family History  Problem Relation Age of Onset   Coronary artery disease Mother 50   Diabetes Mother    Stroke Mother    Heart attack Mother    Cancer Brother    Heart disease Brother    Stroke Brother    Kidney disease Brother    Cancer Sister    Diabetes Brother      Social History   Tobacco Use   Smoking status: Former Smoker    Packs/day: 0.50    Years: 10.00    Pack years: 5.00    Types: Cigarettes    Quit date: 09/09/1996    Years since quitting: 23.7   Smokeless tobacco: Current User    Types: Chew   Vaping Use   Vaping Use: Never used  Substance Use Topics   Alcohol use: No   Drug use: No    Prior to Admission medications   Medication Sig Start Date End Date Taking? Authorizing Provider  allopurinol (ZYLOPRIM) 100 MG tablet Take 200 mg by mouth daily.  10/26/19  Yes [provider]  aspirin EC 81 MG tablet Take 1 tablet (81 mg total) by mouth 2 (two)  times daily after a meal. 05/30/20  Yes Nida, Mitzi Hansen, PA-C  benazepril (LOTENSIN) 20 MG tablet Take 20 mg by mouth daily. 08/02/13  Yes Larey Dresser, MD  folic acid (FOLVITE) 1 MG tablet Take 2 mg by mouth daily. 05/10/20  Yes [provider]  furosemide (LASIX) 20 MG tablet Take 20 mg by mouth daily.    Yes [provider]  furosemide (LASIX) 40 MG tablet Take 40 mg by mouth.   Yes [provider]  HYDROcodone-acetaminophen (NORCO/VICODIN) 5-325 MG tablet Take 1-2 tablets by mouth every 6 (six) hours as needed for moderate pain or severe pain. 05/30/20  Yes Loni Dolly, PA-C  hydroxychloroquine (PLAQUENIL) 200 MG tablet Take 200 mg by mouth 2 (two) times daily. 10/26/19  Yes [provider]  LANTUS SOLOSTAR 100 UNIT/ML Solostar Pen Inject 10 Units into the skin every evening.  09/01/19  Yes [provider]  metoprolol tartrate (LOPRESSOR) 25 MG tablet Take 0.5 tablets (12.5 mg total) by mouth 2 (two) times daily. 07/19/19  Yes Sande Rives E, PA-C  ONE TOUCH ULTRA TEST test strip  06/24/13  Yes [provider]  predniSONE (DELTASONE) 5 MG tablet Take 5 mg by mouth daily with breakfast.    Yes [provider]  senna-docusate (SENOKOT-S) 8.6-50 MG tablet Take 1 tablet by mouth daily as needed for mild constipation.   Yes [provider]  simvastatin (ZOCOR) 80 MG tablet Take 80 mg by mouth at bedtime.     Yes [provider]  spironolactone (ALDACTONE) 25 MG tablet Take 0.5 tablets (12.5 mg total) by mouth daily. 07/28/19  Yes Belva Crome, MD   tiZANidine (ZANAFLEX) 2 MG tablet Take 1-2 tablets (2-4 mg total) by mouth every 6 (six) hours as needed for muscle spasms. 05/30/20 05/30/21 Yes Loni Dolly, PA-C    Current Facility-Administered Medications  Medication Dose Route Frequency Provider Last Rate Last Admin   0.9 %  sodium chloride infusion (Manually program via Guardrails IV Fluids)   Intravenous Once [place], Carole N, DO       0.9 %  sodium chloride infusion (Manually program via Guardrails IV Fluids)   Intravenous Once Arrien, Jimmy Picket, MD       acetaminophen (TYLENOL) tablet 650 mg  650 mg Oral Q6H PRN Kayleen Memos, DO       allopurinol (ZYLOPRIM) tablet 200 mg  200 mg Oral Daily Irene Pap N, DO   200 mg at 06/16/20 6073   atorvastatin (LIPITOR) tablet 40 mg  40 mg Oral Daily Irene Pap N, DO   40 mg at 06/16/20 7106   feeding supplement (ENSURE ENLIVE) (ENSURE ENLIVE) liquid 237 mL  237 mL Oral BID BM Irene Pap N, DO   237 mL at 26/94/85 4627   folic acid (FOLVITE) tablet 2 mg  2 mg Oral Daily Irene Pap N, DO   2 mg at 06/16/20 0350   hydroxychloroquine (PLAQUENIL) tablet 200 mg  200 mg Oral BID Irene Pap N, DO   200 mg at 06/16/20 0938   insulin aspart (novoLOG) injection 0-5 Units  0-5 Units Subcutaneous QHS Hall, Carole N, DO       insulin aspart (novoLOG) injection 0-9 Units  0-9 Units Subcutaneous TID WC Irene Pap N, DO   1 Units at 06/15/20 1243   ondansetron (ZOFRAN) injection 4 mg  4 mg Intravenous Q6H PRN Irene Pap N, DO       pantoprazole (PROTONIX) EC tablet 40 mg  40 mg Oral BID Arrien, Jimmy Picket, MD       polyethylene glycol [facility] / Floria Raveling) packet 17 g  17 g Oral Daily Kayleen Memos, DO   17 g at 06/15/20 1043   predniSONE (DELTASONE) tablet 5 mg  5 mg Oral Q breakfast Kayleen Memos, DO   5 mg at 06/16/20 0630   senna-docusate (Senokot-S) tablet 2 tablet  2 tablet Oral BID Kayleen Memos, DO   2 tablet at 06/16/20 1601    Allergies as of 06/14/2020   (No  Known Allergies)    Review of Systems:    Constitutional: No weight loss, fever or chills Skin: No rash Cardiovascular: No chest pain Respiratory: No SOB  Gastrointestinal: See HPI and otherwise negative Genitourinary: No dysuria  Neurological: No headache, dizziness or syncope Musculoskeletal: No new muscle or joint pain Hematologic: + bruising and bleeding Psychiatric: No history of depression or anxiety    Physical Exam:  Vital signs in last 24 hours: Temp:  [97.3 F (36.3 C)-98.6 F (37 C)] 97.3 F (36.3 C) (10/08 0648) Pulse Rate:  [60-79] 60 (10/08 0648) Resp:  [16-19] 16 (10/08 0648) BP: (117-131)/(52-98) 128/66 (10/08 0648) SpO2:  [98 %-100 %] 100 % (10/08 0648) Weight:  [09 kg] 63 kg (10/08 0231) Last BM Date: 06/15/20 General:   Pleasant Elderly Caucasian female appears to be in NAD, Well developed, Well nourished, alert and cooperative Head:  Normocephalic and atraumatic. Eyes:   PEERL, EOMI. No icterus. Conjunctiva pink. Ears:  Normal auditory acuity. Neck:  Supple Throat: Oral cavity and pharynx without inflammation, swelling or lesion.  Lungs: Respirations even and unlabored. Lungs clear to auscultation bilaterally.   No wheezes, crackles, or rhonchi.  Heart: Normal S1, S2. No MRG. Regular rate and rhythm. No peripheral edema, cyanosis or pallor.  Abdomen:  Soft, nondistended, nontender. No rebound or guarding. Normal bowel sounds. No appreciable masses or hepatomegaly. Rectal:  Not performed.  Msk:  Symmetrical without gross deformities. Peripheral pulses intact.  Extremities:  Without edema, no deformity or joint abnormality.  Neurologic:  Alert and  oriented x4;  grossly normal neurologically.   Skin:   +bruising Psychiatric: Demonstrates good judgement and reason without abnormal affect or behaviors.   LAB RESULTS: Recent Labs    06/14/20 1351 06/14/20 1351 06/15/20 0135 06/15/20 1844 06/16/20 0158  WBC 9.8  --  6.6  --  8.5  HGB 7.7*   < > 7.1*  9.0* 8.7*  HCT 24.7*   < > 22.6* 27.8* 27.1*  PLT 244  --  166  --  166   < > = values in this interval not displayed.   BMET Recent Labs    06/14/20 1351 06/15/20 0135 06/16/20 0158  NA 141 139 144  K 3.8 4.1 4.1  CL 106 109 113*  CO2 24 23 24   GLUCOSE 143* 188* 90  BUN 33* 29* 16  CREATININE 1.57* 1.15* 0.87  CALCIUM 9.0 8.6* 8.9   LFT Recent Labs    06/14/20 1351  PROT 5.7*  ALBUMIN 3.2*  AST 34  ALT 24  ALKPHOS 53  BILITOT 0.8    STUDIES: DG CHEST PORT 1 VIEW  Result Date: 06/14/2020 CLINICAL DATA:  85 year old female with increasing shortness of breath and weakness. Hip surgery last month. EXAM: PORTABLE CHEST 1 VIEW COMPARISON:  CTA chest 06/09/2020 and earlier. FINDINGS: Portable AP semi upright view at 1410 hours. Lung volumes and mediastinal contours remain normal. Prior CABG. Mild chronic increased  interstitial markings are stable. Otherwise Allowing for portable technique the lungs are clear. No pneumothorax or pleural effusion. No acute osseous abnormality identified. IMPRESSION: No acute cardiopulmonary abnormality. Electronically Signed   By: Genevie Ann M.D.   On: 06/14/2020 14:23   VAS [place] ABI WITH/WO TBI  Result Date: 06/15/2020 LOWER EXTREMITY DOPPLER STUDY Indications: Claudication. High Risk         Hypertension, hyperlipidemia, Diabetes, coronary artery Factors:          disease.  Comparison Study: no prior Performing Technologist: Abram Sander RVS  Examination Guidelines: A complete evaluation includes at minimum, Doppler waveform signals and systolic blood pressure reading at the level of bilateral brachial, anterior tibial, and posterior tibial arteries, when vessel segments are accessible. Bilateral testing is considered an integral part of a complete examination. Photoelectric Plethysmograph (PPG) waveforms and toe systolic pressure readings are included as required and additional duplex testing as needed. Limited examinations for reoccurring indications  may be performed as noted.  ABI Findings: +--------+------------------+-----+---------+--------+  Right    Rt Pressure (mmHg) Index Waveform  Comment   +--------+------------------+-----+---------+--------+  Brachial 114                      triphasic           +--------+------------------+-----+---------+--------+  PTA                               absent              +--------+------------------+-----+---------+--------+  DP                                absent              +--------+------------------+-----+---------+--------+ +--------+------------------+-----+-------------------+-------+  Left     Lt Pressure (mmHg) Index Waveform            Comment  +--------+------------------+-----+-------------------+-------+  Brachial 111                      triphasic                    +--------+------------------+-----+-------------------+-------+  PTA      255                2.24  dampened monophasic          +--------+------------------+-----+-------------------+-------+  DP       97                 0.85  dampened monophasic          +--------+------------------+-----+-------------------+-------+ +-------+-----------+-----------+------------+------------+  ABI/TBI Today's ABI Today's TBI Previous ABI Previous TBI  +-------+-----------+-----------+------------+------------+  Right               2.25                                   +-------+-----------+-----------+------------+------------+  Summary: Right: Resting right ankle-brachial index indicates critical limb ischemia. ATA, PTA, Peroneal artery all appear to be occluded. Further testing may be warranted. Popliteal artery has monophasic flow. Left: Resting left ankle-brachial index indicates noncompressible left lower extremity arteries.  *See table(s) above for measurements and observations.  Electronically signed by Monica Martinez MD on 06/15/2020 at 3:17:31 PM.   Final  Impression / Plan:   Impression: 1.  Symptomatic anemia, unclear etiology:  10/1 found to have anemia with a hemoglobin of 7.3, at time of admission hemoglobin 7.7--> 2 units PRBCs-->9.0-->8.7, had 1 bowel movement last night with streaks of bright red blood and 2 reported today, the first was black and the second was black with also some bright red blood, no hemoglobin recheck since 1:00 this morning; consider upper versus lower GI bleed 2.  Generalized weakness: Thought to be multifactorial 3.  Bilateral lower extremity edema 4.  Dyspnea without hypoxia 5.  CAD status post CABG 6.  Chronic diastolic CHF 7.  Constipation: Suspected opiate induced  Plan: 1.  Ordered recheck hemoglobin now 2.  Plans are for EGD and colonoscopy tomorrow morning given that the patient has already eaten today.  Did discuss risks, benefits, limitations and alternatives and patient agrees to proceed. 3.  Movi prep is ordered.  Patient will be on clear liquid diet today and n.p.o. after midnight. 4.  Patient is already on Pantoprazole 40 mg twice daily, would continue this 5.  Please await any further recommendations from Dr. Havery Moros later this afternoon.  Thank you for your kind consultation, we will continue to follow.  Lavone Nian Tauri Ethington  06/16/2020, 11:03 AM

## 2020-06-16 NOTE — Progress Notes (Signed)
Report given to [facility], [place]. Patient is alert and oriented x4 resting in bed with call light in reach. VSS. ACHS. 1 episode of bloody stools noted overnight and on call MD made aware.  Hgb 8.7, Hct 27.1 (slight decrease), K+ 4.1, Creat 0.87 this AM. If stable, patient to DC home today with possible home health services. Daughter updated via telephone overnight. Per vascular MD, follow up may be needed for ABI results (right limb ischemia).

## 2020-06-17 ENCOUNTER — Inpatient Hospital Stay (HOSPITAL_COMMUNITY): Payer: Medicare Other | Admitting: Anesthesiology

## 2020-06-17 ENCOUNTER — Encounter (HOSPITAL_COMMUNITY)
Admission: AD | Disposition: A | Discharge status: Home Health | Payer: Self-pay | Source: Ambulatory Visit | Attending: Internal Medicine

## 2020-06-17 ENCOUNTER — Encounter (HOSPITAL_COMMUNITY): Payer: Self-pay | Admitting: Internal Medicine

## 2020-06-17 DIAGNOSIS — I25709 Atherosclerosis of coronary artery bypass graft(s), unspecified, with unspecified angina pectoris: Secondary | ICD-10-CM | POA: Diagnosis not present

## 2020-06-17 DIAGNOSIS — K269 Duodenal ulcer, unspecified as acute or chronic, without hemorrhage or perforation: Secondary | ICD-10-CM

## 2020-06-17 DIAGNOSIS — I5032 Chronic diastolic (congestive) heart failure: Secondary | ICD-10-CM | POA: Diagnosis not present

## 2020-06-17 DIAGNOSIS — D649 Anemia, unspecified: Secondary | ICD-10-CM | POA: Diagnosis not present

## 2020-06-17 DIAGNOSIS — D125 Benign neoplasm of sigmoid colon: Secondary | ICD-10-CM | POA: Diagnosis not present

## 2020-06-17 DIAGNOSIS — K635 Polyp of colon: Secondary | ICD-10-CM

## 2020-06-17 HISTORY — PX: POLYPECTOMY: SHX5525

## 2020-06-17 HISTORY — PX: ESOPHAGOGASTRODUODENOSCOPY (EGD) WITH PROPOFOL: SHX5813

## 2020-06-17 HISTORY — PX: BIOPSY: SHX5522

## 2020-06-17 HISTORY — PX: COLONOSCOPY WITH PROPOFOL: SHX5780

## 2020-06-17 HISTORY — PX: HEMOSTASIS CLIP PLACEMENT: SHX6857

## 2020-06-17 LAB — BASIC METABOLIC PANEL
Anion gap: 10 (ref 5–15)
BUN: 12 mg/dL (ref 8–23)
CO2: 21 mmol/L — ABNORMAL LOW (ref 22–32)
Calcium: 8.8 mg/dL — ABNORMAL LOW (ref 8.9–10.3)
Chloride: 116 mmol/L — ABNORMAL HIGH (ref 98–111)
Creatinine, Ser: 0.9 mg/dL (ref 0.44–1.00)
GFR, Estimated: 59 mL/min — ABNORMAL LOW (ref >60)
Glucose, Bld: 91 mg/dL (ref 70–99)
Potassium: 3.7 mmol/L (ref 3.5–5.1)
Sodium: 147 mmol/L — ABNORMAL HIGH (ref 135–145)

## 2020-06-17 LAB — GLUCOSE, CAPILLARY
Glucose-Capillary: 76 mg/dL (ref 70–99)
Glucose-Capillary: 81 mg/dL (ref 70–99)
Glucose-Capillary: 109 mg/dL — ABNORMAL HIGH (ref 70–99)
Glucose-Capillary: 109 mg/dL — ABNORMAL HIGH (ref 70–99)

## 2020-06-17 LAB — CBC
HCT: 32.8 % — ABNORMAL LOW (ref 36.0–46.0)
Hemoglobin: 10.3 g/dL — ABNORMAL LOW (ref 12.0–15.0)
MCH: 31.9 pg (ref 26.0–34.0)
MCHC: 31.4 g/dL (ref 30.0–36.0)
MCV: 101.5 fL — ABNORMAL HIGH (ref 80.0–100.0)
Platelets: 135 10*3/uL — ABNORMAL LOW (ref 150–400)
RBC: 3.23 MIL/uL — ABNORMAL LOW (ref 3.87–5.11)
RDW: 19.9 % — ABNORMAL HIGH (ref 11.5–15.5)
WBC: 8.4 10*3/uL (ref 4.0–10.5)
nRBC: 0.5 % — ABNORMAL HIGH (ref 0.0–0.2)

## 2020-06-17 SURGERY — ESOPHAGOGASTRODUODENOSCOPY (EGD) WITH PROPOFOL
Anesthesia: Monitor Anesthesia Care

## 2020-06-17 MED ORDER — LACTATED RINGERS IV SOLN: INTRAVENOUS | Status: DC | PRN

## 2020-06-17 MED ORDER — PANTOPRAZOLE SODIUM 40 MG PO TBEC
40.0000 mg | DELAYED_RELEASE_TABLET | Freq: Two times a day (BID) | ORAL | Status: DC
  Administered 2020-06-17 – 2020-06-18 (×2): 40 mg via ORAL
  Filled 2020-06-17 (×3): qty 1

## 2020-06-17 MED ORDER — PROPOFOL 500 MG/50ML IV EMUL
INTRAVENOUS | Status: DC | PRN
  Administered 2020-06-17: 100 ug/kg/min via INTRAVENOUS

## 2020-06-17 MED ORDER — PROPOFOL 10 MG/ML IV BOLUS
INTRAVENOUS | Status: DC | PRN
  Administered 2020-06-17: 20 mg via INTRAVENOUS
  Administered 2020-06-17 (×2): 10 mg via INTRAVENOUS

## 2020-06-17 SURGICAL SUPPLY — 25 items

## 2020-06-17 NOTE — Anesthesia Postprocedure Evaluation (Signed)
Anesthesia Post Note  Patient: Patricia Villegas  Procedure(s) Performed: ESOPHAGOGASTRODUODENOSCOPY (EGD) WITH PROPOFOL (N/A ) COLONOSCOPY WITH PROPOFOL (N/A ) BIOPSY     Patient location during evaluation: PACU Anesthesia Type: MAC Level of consciousness: awake and alert Pain management: pain level controlled Vital Signs Assessment: post-procedure vital signs reviewed and stable Respiratory status: spontaneous breathing, nonlabored ventilation and respiratory function stable Cardiovascular status: stable and blood pressure returned to baseline Anesthetic complications: no   No complications documented.  Last Vitals:  Vitals:   06/17/20 1130 06/17/20 1223  BP: 130/60 (!) 136/52  Pulse: 73 71  Resp: 18 15  Temp: (!) 36.2 C (!) 36.4 C  SpO2: 100% 96%    Last Pain:  Vitals:   06/17/20 1223  TempSrc: Oral  PainSc:                  Audry Pili

## 2020-06-17 NOTE — Progress Notes (Addendum)
PROGRESS NOTE    Patricia Villegas  HCW:[id number] DOB: 16-Mar-1935 DOA: 06/14/2020 PCP: Haywood Pao, MD    Brief Narrative:  Patient admitted to the hospital with working diagnosis of symptomatic anemia, multifactorial combined with acute blood loss due to upper GI bleed/ duodenal ulcers x30.  85 year old female with significant past medical history for hypertension, dyslipidemia, type 2 diabetes mellitus, coronary artery disease, chronic anemia, and chronic back pain. Patient reported significant weakness, fatigue and dyspnea for about 2 weeks. It was associated with lower extremity edema. ED visit 10/1, found to have anemia with hemoglobin 7.3, work-up with ultrasonography of lower extremities and chest CT angiography was negative for venous thromboembolism. On the day of admission she had a follow-up at the cardiology office, and she was referred for further hospitalization. On her initial physical examination temperature 98, blood pressure 118/43, heart rate 70, respiratory rate 19, oxygen saturation 100% on room air. Lungs were clear to auscultation bilaterally, heart S1-S2, present and rhythmic, abdomen soft, positive lower extremity edema bilaterally. Sodium 141, potassium 3.8, chloride 106, bicarb 24, glucose 143, BUN 33, creatinine 1.57, white count 9.8, hemoglobin 7.7, hematocrit 24.7, platelets 244. SARS COVID-19 negative. Urinalysis negative for infection. Chest radiograph with increased lung markings bilaterally. EKG 66 bpm, left axis deviation, left bundle branch block, sinus rhythm, Q-wave V1-V2, J-point elevation V1-V3, T wave inversion aVL.  Patient received 2 units of PRBC with good toleration, last nigh and this am with positive melena and hematochezia.   Added bid pantoprazole and will consult gastroenterology for possible endoscopy.   Today patient underwent upper endoscopy finding 2 duodenal ulcers, colonoscopy with diverticulosis and polyps, one was removed.    Assessment & Plan:   Principal Problem:   Symptomatic anemia Active Problems:   Hypertension   Hyperlipidemia   Peripheral vascular disease (HCC)   CAD (coronary artery disease) of artery bypass graft   Femoral artery occlusion, bilateral   Rheumatoid arthritis (HCC)   Chronic diastolic CHF (congestive heart failure) (HCC)   Gastrointestinal hemorrhage   Duodenal ulcer   Benign neoplasm of sigmoid colon   1. Symptomatic anemia. On anemia of chronic disease. Acute blood loss anemia due to upper GI bleed/ #2 duodenal ulcers.  Recent orthopedic surgery to her right hip. Iron panel with serum iron of 62, tibc 300, transferrin saturation 21 and ferritin 156.    Hgb this am is 10.3 with Hct is 32.8. Continue with pantoprazole 40 mg po bid for 4 weeks and then continue once daily, transition to oral pantoprazole this pm. Plan to resume on aspiring in 7 days.   Plan to check H&H in am. Out of bed to chair tid with meals.   2. Peripheral vascular disease/ atherosclerotic vascular disease/ CAD sp CABG/ dyslipidemia. Chronic right lower extremity claudication. ABI with critical ischemia to the right lower extremity, ATA, PTA and peroneal arteries appeared to be occluded.  No clinical signs of acute ischemic limb, patient has decided to follow up as outpatient with vascular surgery.   On statin, will plan to resume aspirin in 7 days.   3. Chronic diastolic heart failure. No clinical signs of acute exacerbation, holding diuresis for now.   4. T2DM/ controlled. Fating glucose is 91, capillary 76 and 81 will hold on insulin therapy for now.  5. HTN. stable blood pressure continue close monitoring   6. RA/ gout. Continue with hydroxychloroquine and prednisone.Continue with allopurinol.   7. AKI on CKD stage 2.  Stable  renal function and  electrolytes with cr at 0,90 and K at 3,7 with bicarbonate at 21.     Status is: Inpatient  Remains inpatient appropriate because:Inpatient  level of care appropriate due to severity of illness   Dispo:  Patient From: Home  Planned Disposition: Home with [facility]  Expected discharge date: 06/18/20  Medically stable for discharge: No plan to check H&H in am, if stable will plan to dc home in am.     DVT prophylaxis: scd and out of bed   Code Status:    full  Family Communication:   I spoke over the phone with the patient's son about patient's  condition, plan of care, prognosis and all questions were addressed.   Consultants:   GI   Vascular surgery   Procedures:   egd and colonoscopy      Subjective: Patient is feeling well, no nausea or vomiting, she had egd and colonoscopy this am,. No nausea or vomiting,   Objective: Vitals:   06/17/20 0945 06/17/20 1120 06/17/20 1130 06/17/20 1223  BP: (!) 149/50 (!) 123/55 130/60 (!) 136/52  Pulse: 77 76 73 71  Resp: 15 (!) 21 18 15   Temp: 98.5 F (36.9 C) (!) 97.1 F (36.2 C) (!) 97.2 F (36.2 C) (!) 97.5 F (36.4 C)  TempSrc: Oral   Oral  SpO2: 100% 100% 100% 96%  Weight:      Height:        Intake/Output Summary (Last 24 hours) at 06/17/2020 1334 Last data filed at 06/17/2020 1108 Gross per 24 hour  Intake 1009.09 ml  Output --  Net 1009.09 ml   Filed Weights   06/15/20 0455 06/16/20 0231 06/17/20 0507  Weight: 63 kg 63 kg 62.6 kg    Examination:   General: Not in pain or dyspnea, deconditioned  Neurology: Awake and alert, non focal  E ENT: mild pallor, no icterus, oral mucosa moist Cardiovascular: No JVD. S1-S2 present, rhythmic, no gallops, rubs, or murmurs. Mild right  lower extremity edema. Pulmonary: positive breath sounds bilaterally, adequate air movement, no wheezing, rhonchi or rales. Gastrointestinal. Abdomen soft and non tender Skin. No rashes Musculoskeletal: no joint deformities     Data Reviewed: I have personally reviewed following labs and imaging studies  CBC: Recent Labs  Lab 06/14/20 1351 06/14/20 1351  06/15/20 0135 06/15/20 1844 06/16/20 0158 06/16/20 1224 06/17/20 0221  WBC 9.8  --  6.6  --  8.5  --  8.4  NEUTROABS 8.2*  --   --   --   --   --   --   HGB 7.7*   < > 7.1* 9.0* 8.7* 10.0* 10.3*  HCT 24.7*  --  22.6* 27.8* 27.1*  --  32.8*  MCV 108.3*  --  103.2*  --  99.3  --  101.5*  PLT 244  --  166  --  166  --  135*   < > = values in this interval not displayed.   Basic Metabolic Panel: Recent Labs  Lab 06/14/20 1351 06/15/20 0135 06/16/20 0158 06/17/20 0221  NA 141 139 144 147*  K 3.8 4.1 4.1 3.7  CL 106 109 113* 116*  CO2 24 23 24  21*  GLUCOSE 143* 188* 90 91  BUN 33* 29* 16 12  CREATININE 1.57* 1.15* 0.87 0.90  CALCIUM 9.0 8.6* 8.9 8.8*  MG 2.3  --   --   --   PHOS 3.7  --   --   --  GFR: Estimated Creatinine Clearance: 44.3 mL/min (by C-G formula based on SCr of 0.9 mg/dL). Liver Function Tests: Recent Labs  Lab 06/14/20 1351  AST 34  ALT 24  ALKPHOS 53  BILITOT 0.8  PROT 5.7*  ALBUMIN 3.2*   No results for input(s): LIPASE, AMYLASE in the last 168 hours. No results for input(s): AMMONIA in the last 168 hours. Coagulation Profile: No results for input(s): INR, PROTIME in the last 168 hours. Cardiac Enzymes: No results for input(s): CKTOTAL, CKMB, CKMBINDEX, TROPONINI in the last 168 hours. BNP (last 3 results) Recent Labs    07/19/19 1607 08/31/19 1052  PROBNP 1,614* 980*   HbA1C: Recent Labs    06/14/20 1351  HGBA1C 6.1*   CBG: Recent Labs  Lab 06/16/20 1143 06/16/20 1629 06/16/20 2232 06/17/20 0820 06/17/20 1220  GLUCAP 108* 163* 123* 76 81   Lipid Profile: No results for input(s): CHOL, HDL, LDLCALC, TRIG, CHOLHDL, LDLDIRECT in the last 72 hours. Thyroid Function Tests: Recent Labs    06/14/20 1351  TSH 4.392   Anemia Panel: Recent Labs    06/14/20 1351  VITAMINB12 471  FOLATE >100.0  FERRITIN 156  TIBC 300  IRON 62      Radiology Studies: I have reviewed all of the imaging during this hospital visit  personally     Scheduled Meds: . sodium chloride   Intravenous Once  . sodium chloride   Intravenous Once  . allopurinol  200 mg Oral Daily  . atorvastatin  40 mg Oral Daily  . feeding supplement (ENSURE ENLIVE)  237 mL Oral BID BM  . folic acid  2 mg Oral Daily  . hydroxychloroquine  200 mg Oral BID  . insulin aspart  0-5 Units Subcutaneous QHS  . insulin aspart  0-9 Units Subcutaneous TID WC  . pantoprazole (PROTONIX) IV  40 mg Intravenous Q12H  . predniSONE  5 mg Oral Q breakfast  . senna-docusate  2 tablet Oral BID   Continuous Infusions:   LOS: 2 days        Brihany Butch Gerome Apley, MD

## 2020-06-17 NOTE — Interval H&P Note (Signed)
History and Physical Interval Note:  06/17/2020 9:44 AM  Patricia Villegas  has presented today for surgery, with the diagnosis of Anemia, GI Bleed.  The various methods of treatment have been discussed with the patient and family. After consideration of risks, benefits and other options for treatment, the patient has consented to  Procedure(s): ESOPHAGOGASTRODUODENOSCOPY (EGD) WITH PROPOFOL (N/A) COLONOSCOPY WITH PROPOFOL (N/A) as a surgical intervention.  The patient's history has been reviewed, patient examined, no change in status, stable for surgery.  I have reviewed the patient's chart and labs.  Questions were answered to the patient's satisfaction.     [place]

## 2020-06-17 NOTE — Op Note (Addendum)
[facility] Patient Name: Patricia Villegas Procedure Date : 06/17/2020 MRN: [id number] Attending MD: Carlota Raspberry. Havery Moros , MD Date of Birth: 05/22/1935 CSN: [id number] Age: 85 Admit Type: Inpatient Procedure:                Upper GI endoscopy Indications:              Gastrointestinal bleeding of unknown origin - dark                            stools with red blood, anemia Providers:                Carlota Raspberry. Havery Moros, MD, Baird Cancer, RN, Tyna Jaksch Technician Referring MD:              Medicines:                Monitored Anesthesia Care Complications:            No immediate complications. Estimated blood loss:                            Minimal. Estimated Blood Loss:     Estimated blood loss was minimal. Procedure:                Pre-Anesthesia Assessment:                           - Prior to the procedure, a History and Physical                            was performed, and patient medications and                            allergies were reviewed. The patient's tolerance of                            previous anesthesia was also reviewed. The risks                            and benefits of the procedure and the sedation                            options and risks were discussed with the patient.                            All questions were answered, and informed consent                            was obtained. Prior Anticoagulants: The patient has                            taken no previous anticoagulant or antiplatelet  agents. ASA Grade Assessment: III - A patient with                            severe systemic disease. After reviewing the risks                            and benefits, the patient was deemed in                            satisfactory condition to undergo the procedure.                           After obtaining informed consent, the endoscope was                            passed under  direct vision. Throughout the                            procedure, the patient's blood pressure, pulse, and                            oxygen saturations were monitored continuously. The                            [id number]-[id number] ([id number]) Olympus gastroscope was                            introduced through the mouth, and advanced to the                            second part of duodenum. The upper GI endoscopy was                            accomplished without difficulty. The patient                            tolerated the procedure well. Scope In: Scope Out: Findings:      Esophagogastric landmarks were identified: the Z-line was found at 36       cm, the gastroesophageal junction was found at 36 cm and the upper       extent of the gastric folds was found at 38 cm from the incisors.      A 2 cm hiatal hernia was present.      The exam of the esophagus was otherwise normal.      Patchy mildly erythematous mucosa was found in the gastric antrum,       without focal ulcerations.      The exam of the stomach was otherwise normal.      Biopsies were taken with a cold forceps in the gastric body, at the       incisura and in the gastric antrum for Helicobacter pylori testing.      One non-bleeding cratered duodenal ulcer with no stigmata of bleeding       was found in the duodenal bulb. The lesion was 5 mm in largest  dimension. There was another 2-45mm ulcer in the duodenal sweep, clean       based. Edema noted in the duodenal sweep which made visualization in       this area somewhat challenging, no other obvious pathology noted.      The exam of the duodenum was otherwise normal. No heme noted. Impression:               - Esophagogastric landmarks identified.                           - 2 cm hiatal hernia.                           - Normal esophagus otherwise                           - Mildly erythematous mucosa in the antrum.                           - Normal stomach otherwise.  Biopsies taken to rule                            out H pylori                           - 2 non-bleeding duodenal ulcers, clean based, with                            no stigmata of bleeding.                           Ulcers are the likely cause of symptoms, both clean                            based, low risk for recurrent bleeding. No bleeding                            symptoms for > 24 hours. Recommendation:           - Return patient to hospital ward for ongoing care.                           - Resume previous diet.                           - Continue present medications.                           - Await pathology results.                           - continue protonix 40mg  twice daily for 1 month,                            then once daily for one month                           -  Avoid NSAIDs, and hold baby aspirin for a few                            days if possible                           - Patient is stable for discharge home from                            bleeding perspective later today, defer timing of                            discharge to primary team if you wish to watch her                            another night                           - Patient will be contacted with pathology results                            and further recommendations                           - GI service will sign off for now, call with                            further questions Procedure Code(s):        --- Professional ---                           [phone], Esophagogastroduodenoscopy, flexible,                            transoral; with biopsy, single or multiple Diagnosis Code(s):        --- Professional ---                           K44.9, Diaphragmatic hernia without obstruction or                            gangrene                           K31.89, Other diseases of stomach and duodenum                           K26.9, Duodenal ulcer, unspecified as acute or                             chronic, without hemorrhage or perforation                           K92.2, Gastrointestinal hemorrhage, unspecified CPT copyright 2019 American Medical Association. All rights reserved. The codes documented in this report are preliminary and upon coder review may  be revised to meet current compliance requirements. Remo Lipps P. [place] Spainhower, MD 06/17/2020 11:32:28 AM This report has been signed electronically. Number of Addenda: 0

## 2020-06-17 NOTE — Progress Notes (Signed)
Report given to Marya Amsler, Therapist, sports. Patient is alert and oriented x4 resting in bed with call light in reach. IVF infusing. VSS.  Patient completed prep for upcoming EGD and colonoscopy today. ACHS. Patient to follow up with vascular outpatient.  Hgb 10.3, platelets 135, K+ 3.7 and Creat 0.90 this AM. Home with home health when stable.

## 2020-06-17 NOTE — Transfer of Care (Signed)
Immediate Anesthesia Transfer of Care Note  Patient: Patricia Villegas  Procedure(s) Performed: ESOPHAGOGASTRODUODENOSCOPY (EGD) WITH PROPOFOL (N/A ) COLONOSCOPY WITH PROPOFOL (N/A ) BIOPSY  Patient Location: PACU  Anesthesia Type:MAC  Level of Consciousness: awake, alert  and oriented  Airway & Oxygen Therapy: Patient Spontanous Breathing  Post-op Assessment: Report given to RN and Post -op Vital signs reviewed and stable  Post vital signs: Reviewed and stable  Last Vitals:  Vitals Value Taken Time  BP 123/55 06/17/20 1118  Temp    Pulse 76 06/17/20 1120  Resp 21 06/17/20 1120  SpO2 100 % 06/17/20 1120  Vitals shown include unvalidated device data.  Last Pain:  Vitals:   06/17/20 0945  TempSrc: Oral  PainSc: 0-No pain         Complications: No complications documented.

## 2020-06-17 NOTE — Op Note (Signed)
[facility] Patient Name: Patricia Villegas Procedure Date : 06/17/2020 MRN: [id number] Attending MD: Carlota Raspberry. Havery Moros , MD Date of Birth: 1934-10-26 CSN: [id number] Age: 85 Admit Type: Inpatient Procedure:                Colonoscopy Indications:              Gastrointestinal bleeding of unclear etiology,                            anemia Providers:                Carlota Raspberry. Havery Moros, MD, Baird Cancer, RN, Tyna Jaksch Technician Referring MD:              Medicines:                Monitored Anesthesia Care Complications:            No immediate complications. Estimated blood loss:                            Minimal. Estimated Blood Loss:     Estimated blood loss was minimal. Procedure:                Pre-Anesthesia Assessment:                           - Prior to the procedure, a History and Physical                            was performed, and patient medications and                            allergies were reviewed. The patient's tolerance of                            previous anesthesia was also reviewed. The risks                            and benefits of the procedure and the sedation                            options and risks were discussed with the patient.                            All questions were answered, and informed consent                            was obtained. Prior Anticoagulants: The patient has                            taken no previous anticoagulant or antiplatelet                            agents. ASA Grade Assessment: III - A  patient with                            severe systemic disease. After reviewing the risks                            and benefits, the patient was deemed in                            satisfactory condition to undergo the procedure.                           After obtaining informed consent, the colonoscope                            was passed under direct vision. Throughout the                             procedure, the patient's blood pressure, pulse, and                            oxygen saturations were monitored continuously. The                            [id number]-H190DL ([id number] Olympus pediatric colonscope                            was introduced through the anus and advanced to the                            the terminal ileum, with identification of the                            appendiceal orifice and IC valve. The colonoscopy                            was performed without difficulty. The patient                            tolerated the procedure well. The quality of the                            bowel preparation was good. The terminal ileum,                            ileocecal valve, appendiceal orifice, and rectum                            were photographed. Scope In: 10:41:56 AM Scope Out: 11:08:39 AM Scope Withdrawal Time: 0 hours 21 minutes 14 seconds  Total Procedure Duration: 0 hours 26 minutes 43 seconds  Findings:      Hemorrhoids were found on perianal exam.      The terminal ileum appeared normal.      A few small-mouthed diverticula were found in the  sigmoid colon.      A 10 to 12 mm polyp was found in the sigmoid colon. The polyp was       semi-pedunculated. The polyp was removed with a hot snare. Resection and       retrieval were complete. To prevent bleeding after the polypectomy given       the patient's recent bleeding symptoms, one hemostatic clip was       successfully placed.      Internal hemorrhoids were found during retroflexion.      The exam was otherwise without abnormality. Impression:               - Hemorrhoids found on perianal exam.                           - The examined portion of the ileum was normal.                           - Diverticulosis in the sigmoid colon.                           - One 10 to 12 mm polyp in the sigmoid colon,                            removed with a hot snare. Resected and retrieved.                             Clip was placed prophylactically.                           - Internal hemorrhoids.                           - The examination was otherwise normal.                           No cause for bleeding on colonoscopy. Symptoms                            likely due to duodenal ulcer noted on EGD. Recommendation:           - Return patient to hospital ward for ongoing care.                           - Resume regular diet.                           - Continue present medications.                           - Await pathology results.                           - Full recommendations per EGD note Procedure Code(s):        --- Professional ---  45385, Colonoscopy, flexible; with removal of                            tumor(s), polyp(s), or other lesion(s) by snare                            technique Diagnosis Code(s):        --- Professional ---                           K64.8, Other hemorrhoids                           K63.5, Polyp of colon                           K92.2, Gastrointestinal hemorrhage, unspecified                           K57.30, Diverticulosis of large intestine without                            perforation or abscess without bleeding CPT copyright 2019 American Medical Association. All rights reserved. The codes documented in this report are preliminary and upon coder review may  be revised to meet current compliance requirements. Remo Lipps P. Tashauna Caisse, MD 06/17/2020 11:16:14 AM This report has been signed electronically. Number of Addenda: 0

## 2020-06-18 ENCOUNTER — Encounter (HOSPITAL_COMMUNITY): Payer: Self-pay | Admitting: Gastroenterology

## 2020-06-18 DIAGNOSIS — D125 Benign neoplasm of sigmoid colon: Secondary | ICD-10-CM | POA: Diagnosis not present

## 2020-06-18 DIAGNOSIS — K264 Chronic or unspecified duodenal ulcer with hemorrhage: Secondary | ICD-10-CM

## 2020-06-18 DIAGNOSIS — K269 Duodenal ulcer, unspecified as acute or chronic, without hemorrhage or perforation: Secondary | ICD-10-CM | POA: Diagnosis not present

## 2020-06-18 DIAGNOSIS — I25709 Atherosclerosis of coronary artery bypass graft(s), unspecified, with unspecified angina pectoris: Secondary | ICD-10-CM | POA: Diagnosis not present

## 2020-06-18 DIAGNOSIS — D649 Anemia, unspecified: Secondary | ICD-10-CM | POA: Diagnosis not present

## 2020-06-18 LAB — GLUCOSE, CAPILLARY: Glucose-Capillary: 81 mg/dL (ref 70–99)

## 2020-06-18 LAB — HEMOGLOBIN AND HEMATOCRIT, BLOOD
HCT: 29.8 % — ABNORMAL LOW (ref 36.0–46.0)
Hemoglobin: 9.3 g/dL — ABNORMAL LOW (ref 12.0–15.0)

## 2020-06-18 MED ORDER — PANTOPRAZOLE SODIUM 40 MG PO TBEC: 40.0000 mg | DELAYED_RELEASE_TABLET | Freq: Two times a day (BID) | ORAL | 0 refills | Status: DC

## 2020-06-18 MED ORDER — METHOTREXATE 2.5 MG PO TABS: 2.5000 mg | ORAL_TABLET | ORAL | 0 refills | Status: DC

## 2020-06-18 MED ORDER — ASPIRIN EC 81 MG PO TBEC: 81.0000 mg | DELAYED_RELEASE_TABLET | Freq: Every day | ORAL | 0 refills | Status: DC

## 2020-06-18 MED ORDER — ENSURE ENLIVE PO LIQD: 237.0000 mL | Freq: Two times a day (BID) | ORAL | 0 refills | Status: DC

## 2020-06-18 MED ORDER — METHOTREXATE 2.5 MG PO TABS: ORAL_TABLET | ORAL | 0 refills | Status: DC

## 2020-06-18 MED ORDER — METOPROLOL TARTRATE 12.5 MG HALF TABLET
12.5000 mg | ORAL_TABLET | Freq: Two times a day (BID) | ORAL | Status: DC
  Administered 2020-06-18: 12.5 mg via ORAL
  Filled 2020-06-18: qty 1

## 2020-06-18 NOTE — Progress Notes (Signed)
Report given to Belenda Cruise, Therapist, sports. Patient is alert and oriented x4 resting in bed with call light in reach. VSS. No pain or events overnight. ACHS. Plan is to be [place] home today with home health. Hgb 9.3 and Hct 29.8 this AM.  Per GI, Protonix BID x1 month and patient can restart ASA in 7 days. Patient following up with vascular outpatient.

## 2020-06-18 NOTE — Discharge Summary (Addendum)
Physician Discharge Summary  Patricia Villegas OIN:[id number] DOB: November 25, 1934 DOA: 06/14/2020  PCP: Haywood Pao, MD  Admit date: 06/14/2020 Discharge date: 06/18/2020  Admitted From: Home  Disposition:  Home   Recommendations for Outpatient Follow-up and new medication changes:  1. Follow up with Dr. Osborne Casco in 7 days.  2. Continue with pantoprazole 40 mg bid for 1 month then continue once daily. 3. Resume aspirin on 06/26/20.   Home Health: na   Equipment/Devices: na    Discharge Condition: stable  CODE STATUS: full  Diet recommendation: heart healthy and diabetic prudent.   Brief/Interim Summary: Patient admitted to the hospital with working diagnosis of symptomatic anemia, multifactorial combined with acute blood loss due to upper GI bleed/ duodenal ulcers x67.  85 year old female with significant past medical history for hypertension, dyslipidemia, type 2 diabetes mellitus, coronary artery disease, chronic anemia, and chronic back pain. Patient reported significant weakness, fatigue and dyspnea for about 2 weeks. Symptoms were associated with lower extremity edema. ED visit 10/1, found to have anemia with hemoglobin 7.3, work-up with ultrasonography of lower extremities and chest CT angiography was negative for venous thromboembolism. On the day of admission she had a follow-up at the cardiology office, and she was referred for further hospitalization due to worsening symptomatic anemia. On her initial physical examination temperature 98, blood pressure 118/43, heart rate 70, respiratory rate 19, oxygen saturation 100% on room air. Lungs were clear to auscultation bilaterally, heart S1-S2, present and rhythmic, abdomen soft, positive lower extremity edema bilaterally. Sodium 141, potassium 3.8, chloride 106, bicarb 24, glucose 143, BUN 33, creatinine 1.57, white count 9.8, hemoglobin 7.7, hematocrit 24.7, platelets 244. SARS COVID-19 negative. Urinalysis negative for infection.  Chest radiograph with increased lung markings bilaterally. EKG 66 bpm, left axis deviation, left bundle branch block, sinus rhythm, Q-wave V1-V2, J-point elevation V1-V3, T wave inversion aVL.   Her Hgb drop to 7.1 and patient received 2 units of PRBC with good toleration. During her hospitalization she had positive melena and hematochezia.   Added bid pantoprazole and consulted gastroenterology.  Patient underwent upper endoscopy finding 2 duodenal ulcers, colonoscopy with diverticulosis and polyp which was removed.   1.  Symptomatic acute on chronic anemia, multifactorial, component of acute blood loss due to upper GI bleed, duodenal ulcers.  Patient was admitted to the medical ward, she was placed on a remote telemetry monitoring. Iron stores showed iron of 62, TIBC 300, transferrin saturation 21 and ferritin 156.  After 2 units of vitamin B12 deficiency but hemoglobin stabilized.  Because of melena/hematochezia aspirin was discontinued and patient was placed on pantoprazole.  Further work-up with endoscopy and colonoscopy showed: Mildly erythematous mucosa in the antrum, biopsies were taken to rule out H. pylori, positive to 1 nonbleeding duodenal ulcers, clean base with no stigmata of bleeding.  Cannot determine your polyp in the sigmoid colon which was removed.  Patient will continue taking pantoprazole twice daily for 1 month and then continue once daily, resume aspirin within 7 days. Her discharge hemoglobin is 9.3, hematocrit 29.8.  2.  Peripheral vascular disease, atherosclerotic vascular disease coronary artery disease status post bypass grafting, dyslipidemia.  Patient had chronic lower extremity claudication, further work-up with ABIs showed critical ischemia to the right lower extremity, ATA, PTA and peroneal arteries appear to be occluded.  Clinically does not have any signs of acute ischemia, normal temperature, normal skin appearance.  Patient was evaluated by vascular  surgery, decision was to continue medical therapy and follow-up as an  outpatient. Continue statin therapy, and resume aspirin within 7 days.  3.  Chronic diastolic heart failure.  No signs of acute exacerbation, patient will resume furosemide at discharge.  4.  Type 2 diabetes mellitus, controlled.  Her glucose remained controlled during her hospitalization. At discharge continue insulin therapy with 5 units of lantus.   5.  Hypertension.  Blood pressure remained controlled during her hospitalization. At discharge will resume benazepril metoprolol, spironolactone and furosemide.   6.  Rheumatoid arthritis/gout.  No signs of acute flare, continue hydroxychloroquine, prednisone and allopurinol.  7.  Acute kidney injury on chronic kidney disease stage II.  Patient received supportive medical therapy, including correction of anemia.  Her peak creatinine reached 1.57, at discharge is 0.90, potassium 3.7, bicarb 21, chloride 116.   Discharge Diagnoses:  Principal Problem:   Symptomatic anemia Active Problems:   Hypertension   Hyperlipidemia   Peripheral vascular disease (HCC)   CAD (coronary artery disease) of artery bypass graft   Femoral artery occlusion, bilateral   Rheumatoid arthritis (HCC)   Chronic diastolic CHF (congestive heart failure) (HCC)   Gastrointestinal hemorrhage   Duodenal ulcer   Benign neoplasm of sigmoid colon    Discharge Instructions   Allergies as of 06/18/2020   No Known Allergies     Medication List    TAKE these medications   allopurinol 100 MG tablet Commonly known as: ZYLOPRIM Take 200 mg by mouth daily.   aspirin EC 81 MG tablet Take 1 tablet (81 mg total) by mouth daily. Please resume on 06/26/20 What changed:   when to take this  additional instructions   benazepril 20 MG tablet Commonly known as: LOTENSIN Take 20 mg by mouth daily.   feeding supplement (ENSURE ENLIVE) Liqd Take 237 mLs by mouth 2 (two) times daily between meals.    folic acid 1 MG tablet Commonly known as: FOLVITE Take 2 mg by mouth daily.   furosemide 20 MG tablet Commonly known as: LASIX Take 20 mg by mouth daily. What changed: Another medication with the same name was removed. Continue taking this medication, and follow the directions you see here.   HYDROcodone-acetaminophen 5-325 MG tablet Commonly known as: NORCO/VICODIN Take 1-2 tablets by mouth every 6 (six) hours as needed for moderate pain or severe pain.   hydroxychloroquine 200 MG tablet Commonly known as: PLAQUENIL Take 200 mg by mouth 2 (two) times daily.   Lantus SoloStar 100 UNIT/ML Solostar Pen Generic drug: insulin glargine Inject 10 Units into the skin every evening.   methotrexate 2.5 MG tablet Commonly known as: RHEUMATREX Caution:Chemotherapy. Protect from light. Please resume your regular dose as before admission to the hospital.   metoprolol tartrate 25 MG tablet Commonly known as: LOPRESSOR Take 0.5 tablets (12.5 mg total) by mouth 2 (two) times daily.   ONE TOUCH ULTRA TEST test strip Generic drug: glucose blood Notes to patient: As Before, As Directed   pantoprazole 40 MG tablet Commonly known as: PROTONIX Take 1 tablet (40 mg total) by mouth 2 (two) times daily. Take twice daily for one month then continue with once daily.   predniSONE 5 MG tablet Commonly known as: DELTASONE Take 5 mg by mouth daily with breakfast.   senna-docusate 8.6-50 MG tablet Commonly known as: Senokot-S Take 1 tablet by mouth daily as needed for mild constipation.   simvastatin 80 MG tablet Commonly known as: ZOCOR Take 80 mg by mouth at bedtime.   spironolactone 25 MG tablet Commonly known as: ALDACTONE Take 0.5  tablets (12.5 mg total) by mouth daily.   tiZANidine 2 MG tablet Commonly known as: ZANAFLEX Take 1-2 tablets (2-4 mg total) by mouth every 6 (six) hours as needed for muscle spasms.       No Known Allergies  Consultations:  GI     Procedures/Studies: DG Chest 2 View  Result Date: 06/08/2020 CLINICAL DATA:  Shortness of breath. Additional history provided: Patient reports central chest pain, weakness and shortness of breath for 1 day. EXAM: CHEST - 2 VIEW COMPARISON:  Prior chest radiographs 07/21/2019 and earlier FINDINGS: Prior median sternotomy and CABG. Heart size within normal limits. Aortic atherosclerosis. No appreciable airspace consolidation. Chronic prominence of the interstitial lung markings. No evidence of pleural effusion or pneumothorax. No acute bony abnormality identified. IMPRESSION: No evidence of acute cardiopulmonary abnormality. Redemonstrated chronic prominence of the interstitial lung markings. Prior CABG. Aortic Atherosclerosis (ICD10-I70.0). Electronically Signed   By: Kellie Simmering DO   On: 06/08/2020 16:56   CT Angio Chest PE W and/or Wo Contrast  Result Date: 06/09/2020 CLINICAL DATA:  Shortness of breath and bilateral leg edema. Hip surgery 9 days ago. Suspicion of pulmonary embolus with high probability. EXAM: CT ANGIOGRAPHY CHEST WITH CONTRAST TECHNIQUE: Multidetector CT imaging of the chest was performed using the standard protocol during bolus administration of intravenous contrast. Multiplanar CT image reconstructions and MIPs were obtained to evaluate the vascular anatomy. CONTRAST:  1102mL OMNIPAQUE IOHEXOL 350 MG/ML SOLN COMPARISON:  08/23/2011 FINDINGS: Cardiovascular: Good opacification of the central and segmental pulmonary arteries. No focal filling defects. No evidence of significant pulmonary embolus. Cardiac enlargement. No pericardial effusions. Postoperative changes in the mediastinum consistent with coronary bypass. Calcification of the aorta. Calcified mitral valve annulus. Dilated ascending thoracic aorta at 3.9 cm AP diameter. No aortic dissection. Great vessel origins are patent. Mediastinum/Nodes: Esophagus is decompressed. No significant lymphadenopathy. Lungs/Pleura: Motion  artifact limits examination. Peripheral basilar infiltrates probably representing dependent atelectasis. Probable emphysematous changes. Focal subpleural nodular opacities in the right lung are unchanged since prior study. No pleural effusions. No pneumothorax. Airways are patent. Upper Abdomen: Cholelithiasis with contracted gallbladder. No bile duct dilatation. Right renal cyst. Musculoskeletal: Degenerative changes in the spine. Anterior compression of T11. This is new since the previous study but appears more chronic. Sternotomy wires. Review of the MIP images confirms the above findings. IMPRESSION: 1. No evidence of significant pulmonary embolus. 2. Cardiac enlargement. 3. Peripheral basilar infiltrates probably representing dependent atelectasis. 4. Probable emphysematous changes. 5. Cholelithiasis with contracted gallbladder. 6. Anterior compression of T11, new since previous study but appears more chronic. 7.  Aortic atherosclerosis. Aortic Atherosclerosis (ICD10-I70.0) and Emphysema (ICD10-J43.9). Electronically Signed   By: Lucienne Capers M.D.   On: 06/09/2020 01:15   DG CHEST PORT 1 VIEW  Result Date: 06/14/2020 CLINICAL DATA:  85 year old female with increasing shortness of breath and weakness. Hip surgery last month. EXAM: PORTABLE CHEST 1 VIEW COMPARISON:  CTA chest 06/09/2020 and earlier. FINDINGS: Portable AP semi upright view at 1410 hours. Lung volumes and mediastinal contours remain normal. Prior CABG. Mild chronic increased interstitial markings are stable. Otherwise Allowing for portable technique the lungs are clear. No pneumothorax or pleural effusion. No acute osseous abnormality identified. IMPRESSION: No acute cardiopulmonary abnormality. Electronically Signed   By: Genevie Ann M.D.   On: 06/14/2020 14:23   DG C-Arm 1-60 Min-No Report  Result Date: 05/30/2020 Fluoroscopy was utilized by the requesting physician.  No radiographic interpretation.   VAS [place] ABI WITH/WO TBI  Result  Date: 06/15/2020  LOWER EXTREMITY DOPPLER STUDY Indications: Claudication. High Risk         Hypertension, hyperlipidemia, Diabetes, coronary artery Factors:          disease.  Comparison Study: no prior Performing Technologist: Abram Sander RVS  Examination Guidelines: A complete evaluation includes at minimum, Doppler waveform signals and systolic blood pressure reading at the level of bilateral brachial, anterior tibial, and posterior tibial arteries, when vessel segments are accessible. Bilateral testing is considered an integral part of a complete examination. Photoelectric Plethysmograph (PPG) waveforms and toe systolic pressure readings are included as required and additional duplex testing as needed. Limited examinations for reoccurring indications may be performed as noted.  ABI Findings: +--------+------------------+-----+---------+--------+ Right   Rt Pressure (mmHg)IndexWaveform Comment  +--------+------------------+-----+---------+--------+ [id number]                    triphasic         +--------+------------------+-----+---------+--------+ PTA                            absent            +--------+------------------+-----+---------+--------+ DP                             absent            +--------+------------------+-----+---------+--------+ +--------+------------------+-----+-------------------+-------+ Left    Lt Pressure (mmHg)IndexWaveform           Comment +--------+------------------+-----+-------------------+-------+ [id number]                    triphasic                  +--------+------------------+-----+-------------------+-------+ PTA     255               2.24 dampened monophasic        +--------+------------------+-----+-------------------+-------+ DP      97                0.85 dampened monophasic        +--------+------------------+-----+-------------------+-------+ +-------+-----------+-----------+------------+------------+  ABI/TBIToday's ABIToday's TBIPrevious ABIPrevious TBI +-------+-----------+-----------+------------+------------+ Right             2.25                                +-------+-----------+-----------+------------+------------+  Summary: Right: Resting right ankle-brachial index indicates critical limb ischemia. ATA, PTA, Peroneal artery all appear to be occluded. Further testing may be warranted. Popliteal artery has monophasic flow. Left: Resting left ankle-brachial index indicates noncompressible left lower extremity arteries.  *See table(s) above for measurements and observations.  Electronically signed by Monica Martinez MD on 06/15/2020 at 3:17:31 PM.   Final    DG HIP OPERATIVE UNILAT W OR W/O PELVIS RIGHT  Result Date: 05/30/2020 CLINICAL DATA:  Total hip replacement. EXAM: OPERATIVE RIGHT HIP (WITH PELVIS IF PERFORMED) 1 VIEW TECHNIQUE: Fluoroscopic spot image(s) were submitted for interpretation post-operatively. COMPARISON:  01/10/2015. FINDINGS: Total right hip replacement. Hardware intact. Anatomic alignment. Peripheral vascular calcification. Fluoro time 0 minutes 17 seconds. Radiation dose 1.6 mGy. IMPRESSION: 1.  Total right hip replacement with anatomic alignment. 2.  Peripheral vascular disease. Electronically Signed   By: Marcello Moores  Register   On: 05/30/2020 09:17   VAS [place] LOWER EXTREMITY VENOUS (DVT) (ONLY [facility] & [facility])  Result Date: 06/10/2020  Lower Venous DVTStudy Indications: SOB, right  leg swelling.  Limitations: Poor ultrasound/tissue interface. Comparison Study: No prior study Performing Technologist: Maudry Mayhew MHA, RDMS, RVT, RDCS  Examination Guidelines: A complete evaluation includes B-mode imaging, spectral Doppler, color Doppler, and power Doppler as needed of all accessible portions of each vessel. Bilateral testing is considered an integral part of a complete examination. Limited examinations for reoccurring indications may be performed as noted. The reflux  portion of the exam is performed with the patient in reverse Trendelenburg.  +---------+---------------+---------+-----------+----------+--------------+ RIGHT    CompressibilityPhasicitySpontaneityPropertiesThrombus Aging +---------+---------------+---------+-----------+----------+--------------+ CFV      Full           Yes      Yes                                 +---------+---------------+---------+-----------+----------+--------------+ SFJ      Full                                                        +---------+---------------+---------+-----------+----------+--------------+ FV Prox  Full                                                        +---------+---------------+---------+-----------+----------+--------------+ FV Mid   Full                                                        +---------+---------------+---------+-----------+----------+--------------+ FV DistalFull                                                        +---------+---------------+---------+-----------+----------+--------------+ PFV      Full                                                        +---------+---------------+---------+-----------+----------+--------------+ POP      Full           Yes      Yes                                 +---------+---------------+---------+-----------+----------+--------------+ PTV      Full                                                        +---------+---------------+---------+-----------+----------+--------------+   Right Technical Findings: Not visualized segments include PFV, peroneal veins.  +----+---------------+---------+-----------+----------+--------------+ LEFTCompressibilityPhasicitySpontaneityPropertiesThrombus Aging +----+---------------+---------+-----------+----------+--------------+ CFV Full  Yes      Yes                                  +----+---------------+---------+-----------+----------+--------------+     Summary: RIGHT: - There is no evidence of deep vein thrombosis in the lower extremity. However, portions of this examination were limited- see technologist comments above.  - No cystic structure found in the popliteal fossa.  LEFT: - No evidence of common femoral vein obstruction.  *See table(s) above for measurements and observations. Electronically signed by Harold Barban MD on 06/10/2020 at 1:10:09 PM.    Final       Procedures: upper endoscopy and colonoscopy.   Subjective: Patient is feeling better, no nausea or vomiting, no chest pain or dyspnea, no further melena or hematochezia.   Discharge Exam: Vitals:   06/17/20 2055 06/18/20 0400  BP:  137/89  Pulse:  78  Resp:  17  Temp:  98.7 F (37.1 C)  SpO2: 98% 98%   Vitals:   06/17/20 1223 06/17/20 2027 06/17/20 2055 06/18/20 0400  BP: (!) 136/52 (!) 112/55  137/89  Pulse: 71 74  78  Resp: 15 18  17   Temp: (!) 97.5 F (36.4 C) (!) 97.5 F (36.4 C)  98.7 F (37.1 C)  TempSrc: Oral Oral  Oral  SpO2: 96% 98% 98% 98%  Weight:    62.9 kg  Height:        General: Not in pain or dyspnea.,  Neurology: Awake and alert, non focal  E ENT: no pallor, no icterus, oral mucosa moist Cardiovascular: No JVD. S1-S2 present, rhythmic, no gallops, rubs, or murmurs. No lower extremity edema. Pulmonary: positive breath sounds bilaterally, Gastrointestinal. Abdomen soft abdomen. Skin. No rashes Musculoskeletal: no joint deformities   The results of significant diagnostics from this hospitalization (including imaging, microbiology, ancillary and laboratory) are listed below for reference.     Microbiology: Recent Results (from the past 240 hour(s))  Respiratory Panel by RT PCR (Flu A&B, Covid) - Nasopharyngeal Swab     Status: None   Collection Time: 06/14/20  1:26 PM   Specimen: Nasopharyngeal Swab  Result Value Ref Range Status   SARS Coronavirus 2 by RT PCR  NEGATIVE NEGATIVE Final    Comment: (NOTE) SARS-CoV-2 target nucleic acids are NOT DETECTED.  The SARS-CoV-2 RNA is generally detectable in upper respiratoy specimens during the acute phase of infection. The lowest concentration of SARS-CoV-2 viral copies this assay can detect is 131 copies/mL. A negative result does not preclude SARS-Cov-2 infection and should not be used as the sole basis for treatment or other patient management decisions. A negative result may occur with  improper specimen collection/handling, submission of specimen other than nasopharyngeal swab, presence of viral mutation(s) within the areas targeted by this assay, and inadequate number of viral copies (<131 copies/mL). A negative result must be combined with clinical observations, patient history, and epidemiological information. The expected result is Negative.  Fact Sheet for Patients:  PinkCheek.be  Fact Sheet for Healthcare Providers:  GravelBags.it  This test is no t yet approved or cleared by the [place] FDA and  has been authorized for detection and/or diagnosis of SARS-CoV-2 by FDA under an Emergency Use Authorization (EUA). This EUA will remain  in effect (meaning this test can be used) for the duration of the COVID-19 declaration under Section 564(b)(1) of the Act, 21 U.S.C. section 360bbb-3(b)(1), unless the authorization is terminated  or revoked sooner.     Influenza A by PCR NEGATIVE NEGATIVE Final   Influenza B by PCR NEGATIVE NEGATIVE Final    Comment: (NOTE) The Xpert Xpress SARS-CoV-2/FLU/RSV assay is intended as an aid in  the diagnosis of influenza from Nasopharyngeal swab specimens and  should not be used as a sole basis for treatment. Nasal washings and  aspirates are unacceptable for Xpert Xpress SARS-CoV-2/FLU/RSV  testing.  Fact Sheet for Patients: PinkCheek.be  Fact Sheet for  Healthcare Providers: GravelBags.it  This test is not yet approved or cleared by the [place] FDA and  has been authorized for detection and/or diagnosis of SARS-CoV-2 by  FDA under an Emergency Use Authorization (EUA). This EUA will remain  in effect (meaning this test can be used) for the duration of the  Covid-19 declaration under Section 564(b)(1) of the Act, 21  U.S.C. section 360bbb-3(b)(1), unless the authorization is  terminated or revoked. Performed at [facility], [place] [address], [place], [place] [address]      Labs: BNP (last 3 results) Recent Labs    06/08/20 1630 06/14/20 1351  BNP 177.6* 503.5*   Basic Metabolic Panel: Recent Labs  Lab 06/14/20 1351 06/15/20 0135 06/16/20 0158 06/17/20 0221  NA 141 139 144 147*  K 3.8 4.1 4.1 3.7  CL 106 109 113* 116*  CO2 24 23 24  21*  GLUCOSE 143* 188* 90 91  BUN 33* 29* 16 12  CREATININE 1.57* 1.15* 0.87 0.90  CALCIUM 9.0 8.6* 8.9 8.8*  MG 2.3  --   --   --   PHOS 3.7  --   --   --    Liver Function Tests: Recent Labs  Lab 06/14/20 1351  AST 34  ALT 24  ALKPHOS 53  BILITOT 0.8  PROT 5.7*  ALBUMIN 3.2*   No results for input(s): LIPASE, AMYLASE in the last 168 hours. No results for input(s): AMMONIA in the last 168 hours. CBC: Recent Labs  Lab 06/14/20 1351 06/14/20 1351 06/15/20 0135 06/15/20 0135 06/15/20 1844 06/16/20 0158 06/16/20 1224 06/17/20 0221 06/18/20 0200  WBC 9.8  --  6.6  --   --  8.5  --  8.4  --   NEUTROABS 8.2*  --   --   --   --   --   --   --   --   HGB 7.7*   < > 7.1*   < > 9.0* 8.7* 10.0* 10.3* 9.3*  HCT 24.7*   < > 22.6*  --  27.8* 27.1*  --  32.8* 29.8*  MCV 108.3*  --  103.2*  --   --  99.3  --  101.5*  --   PLT 244  --  166  --   --  166  --  135*  --    < > = values in this interval not displayed.   Cardiac Enzymes: No results for input(s): CKTOTAL, CKMB, CKMBINDEX, TROPONINI in the last 168 hours. BNP: Invalid input(s):  POCBNP CBG: Recent Labs  Lab 06/17/20 0820 06/17/20 1220 06/17/20 1725 06/17/20 2051 06/18/20 0744  GLUCAP 76 81 109* 109* 81   D-Dimer No results for input(s): DDIMER in the last 72 hours. Hgb A1c No results for input(s): HGBA1C in the last 72 hours. Lipid Profile No results for input(s): CHOL, HDL, LDLCALC, TRIG, CHOLHDL, LDLDIRECT in the last 72 hours. Thyroid function studies No results for input(s): TSH, T4TOTAL, T3FREE, THYROIDAB in the last 72 hours.  Invalid input(s): FREET3 Anemia work up No results for input(s): VITAMINB12, FOLATE, FERRITIN, TIBC, IRON, RETICCTPCT in the last 72 hours. Urinalysis    Component Value Date/Time   COLORURINE YELLOW 06/15/2020 0440   APPEARANCEUR CLEAR 06/15/2020 0440   LABSPEC 1.016 06/15/2020 0440   PHURINE 5.0 06/15/2020 0440   GLUCOSEU NEGATIVE 06/15/2020 0440   HGBUR NEGATIVE 06/15/2020 0440   BILIRUBINUR NEGATIVE 06/15/2020 0440   KETONESUR NEGATIVE 06/15/2020 0440   PROTEINUR NEGATIVE 06/15/2020 0440   UROBILINOGEN 0.2 12/29/2014 1103   NITRITE NEGATIVE 06/15/2020 0440   LEUKOCYTESUR MODERATE (A) 06/15/2020 0440   Sepsis Labs Invalid input(s): PROCALCITONIN,  WBC,  LACTICIDVEN Microbiology Recent Results (from the past 240 hour(s))  Respiratory Panel by RT PCR (Flu A&B, Covid) - Nasopharyngeal Swab     Status: None   Collection Time: 06/14/20  1:26 PM   Specimen: Nasopharyngeal Swab  Result Value Ref Range Status   SARS Coronavirus 2 by RT PCR NEGATIVE NEGATIVE Final    Comment: (NOTE) SARS-CoV-2 target nucleic acids are NOT DETECTED.  The SARS-CoV-2 RNA is generally detectable in upper respiratoy specimens during the acute phase of infection. The lowest concentration of SARS-CoV-2 viral copies this assay can detect is 131 copies/mL. A negative result does not preclude SARS-Cov-2 infection and should not be used as the sole basis for treatment or other patient management decisions. A negative result may occur with   improper specimen collection/handling, submission of specimen other than nasopharyngeal swab, presence of viral mutation(s) within the areas targeted by this assay, and inadequate number of viral copies (<131 copies/mL). A negative result must be combined with clinical observations, patient history, and epidemiological information. The expected result is Negative.  Fact Sheet for Patients:  PinkCheek.be  Fact Sheet for Healthcare Providers:  GravelBags.it  This test is no t yet approved or cleared by the [place] FDA and  has been authorized for detection and/or diagnosis of SARS-CoV-2 by FDA under an Emergency Use Authorization (EUA). This EUA will remain  in effect (meaning this test can be used) for the duration of the COVID-19 declaration under Section 564(b)(1) of the Act, 21 U.S.C. section 360bbb-3(b)(1), unless the authorization is terminated or revoked sooner.     Influenza A by PCR NEGATIVE NEGATIVE Final   Influenza B by PCR NEGATIVE NEGATIVE Final    Comment: (NOTE) The Xpert Xpress SARS-CoV-2/FLU/RSV assay is intended as an aid in  the diagnosis of influenza from Nasopharyngeal swab specimens and  should not be used as a sole basis for treatment. Nasal washings and  aspirates are unacceptable for Xpert Xpress SARS-CoV-2/FLU/RSV  testing.  Fact Sheet for Patients: PinkCheek.be  Fact Sheet for Healthcare Providers: GravelBags.it  This test is not yet approved or cleared by the [place] FDA and  has been authorized for detection and/or diagnosis of SARS-CoV-2 by  FDA under an Emergency Use Authorization (EUA). This EUA will remain  in effect (meaning this test can be used) for the duration of the  Covid-19 declaration under Section 564(b)(1) of the Act, 21  U.S.C. section 360bbb-3(b)(1), unless the authorization is  terminated or  revoked. Performed at [facility], [place] [address], [place], [place] [address]      Time coordinating discharge: 45 minutes  SIGNED:   Tawni Millers, MD  [facility] 06/18/2020, 8:13 AM

## 2020-06-18 NOTE — Care Management (Signed)
[id number] 06-18-20 Plan for patient to transition home. Patient is currently active with [facility] for [facility]- confirmed with [facility]. Start of care to begin within 24-48 hours post transition home. Bethena Roys, RN,BSN Case Manager

## 2020-06-19 NOTE — Progress Notes (Deleted)
CARDIOLOGY OFFICE NOTE  Date:  06/19/2020    [name] Date of Birth: Dec 01, 1934 Medical Record #[id number]  PCP:  Haywood Pao, MD  Cardiologist:  Jennings Books  No chief complaint on file.   History of Present Illness: Patricia Villegas is a 85 y.o. female who presents today for a post hospital/follow up visit. Seen for Dr. Tamala Julian.   She has a history of DM II, HTN, [name], chronic diastolic HF and known CAD with remote CABG in 2014.   Last seen by Dr. Tamala Julian in March - felt to be doing ok.   I saw her as a work in earlier this month - she had had her hip replaced last month - while getting PT she passed out. BP was elevated. Lots of dizziness and swelling. Ended up in the ER. Doppler negative for DVT. Lasix had been increased by PCP. She was quite anemic. Continued to feel pre syncopal and very weak. Was sent for direct admission/blood transfusion. Found to have melena and hematochezia - PPI was added and GI consulted. She had EGD - found to have 2 duodenal ulcers. Had colonoscopy as well. Transfused. Also had ABIs done - critical ischemia to the right lower extremity - no signs of acute ischemia - seen by vascular and was to be managed medically and see [facility] as outpatient.   Comes in today. Here with   Past Medical History:  Diagnosis Date  . Anemia   . Anemia 06/14/2020  . Anxiety   . Arthritis   . Blood transfusion   . Chest pain    a. Lexiscan Myoview 12/12:  EF 75%, no ischemia or scar;  b.  Echo 12/12:  EF 60-65%, no sig valvular abnormalities   . Closed left fibular fracture    Casted   . Complication of anesthesia 1998   Unable to arouse fully, respiratory depression, pt. reports that the family was told that she would need a trach., but then she stabilized & didn't need it   . Coronary artery disease    sees Dr Aundra Dubin every 6 months  . Depression   . Diabetes mellitus    Greater than 20 years type 2   . GERD (gastroesophageal reflux disease)   .  H/O hiatal hernia   . Hypercholesteremia   . Hypertension    Greater than 20 years  . Lumbar disc disease    S/p surgery x4;  Sciatica   . Neuropathy due to secondary diabetes ([place])    Hx: of  . Parastomal hernia of ileal conduit 2009  . Pneumonia    s/p CABG  . [name] (peripheral vascular disease) ([facility])    Stable claudication. Arteriogram 07/2009 no significant aortoiliac disease, has bilateral SFA occlusions with distal reconstitution   . Rheumatoid arthritis ([place])    Dr Berna Bue @ [facility]  . Transfusion reaction 1960   Convulsions (? seizure)  . UTI (lower urinary tract infection) 01/03/15    Past Surgical History:  Procedure Laterality Date  . ABDOMINAL HYSTERECTOMY    . BACK SURGERY     4 back operations most recently 1995, 1998  . BIOPSY  06/17/2020   Procedure: BIOPSY;  Surgeon: Yetta Flock, MD;  Location: [facility] ENDOSCOPY;  Service: Gastroenterology;;  . CARDIAC CATHETERIZATION     Hx: of 05/14/13  . CATARACT EXTRACTION W/ INTRAOCULAR LENS  IMPLANT, BILATERAL     Hx: of  . COLONOSCOPY     Hx; of  .  COLONOSCOPY WITH PROPOFOL N/A 06/17/2020   Procedure: COLONOSCOPY WITH PROPOFOL;  Surgeon: Yetta Flock, MD;  Location: [place];  Service: Gastroenterology;  Laterality: N/A;  . CORONARY ARTERY BYPASS GRAFT N/A 05/24/2013   Procedure: CORONARY ARTERY BYPASS GRAFTING (CABG);  Surgeon: Melrose Nakayama, MD;  Location: [place];  Service: Open Heart Surgery;  Laterality: N/A;  . DILATION AND CURETTAGE OF UTERUS    . ESOPHAGOGASTRODUODENOSCOPY (EGD) WITH PROPOFOL N/A 06/17/2020   Procedure: ESOPHAGOGASTRODUODENOSCOPY (EGD) WITH PROPOFOL;  Surgeon: Yetta Flock, MD;  Location: [place];  Service: Gastroenterology;  Laterality: N/A;  . HEMOSTASIS CLIP PLACEMENT  06/17/2020   Procedure: HEMOSTASIS CLIP PLACEMENT;  Surgeon: Yetta Flock, MD;  Location: [facility] ENDOSCOPY;  Service: Gastroenterology;;  . HERNIA REPAIR  2009  . LEFT HEART  CATHETERIZATION WITH CORONARY ANGIOGRAM N/A 05/14/2013   Procedure: LEFT HEART CATHETERIZATION WITH CORONARY ANGIOGRAM;  Surgeon: Larey Dresser, MD;  Location: [facility] CATH LAB;  Service: Cardiovascular;  Laterality: N/A;  . POLYPECTOMY  06/17/2020   Procedure: POLYPECTOMY;  Surgeon: Yetta Flock, MD;  Location: [facility] ENDOSCOPY;  Service: Gastroenterology;;  . TONSILLECTOMY    . TOTAL HIP ARTHROPLASTY Left 01/10/2015   Procedure: TOTAL HIP ARTHROPLASTY ANTERIOR APPROACH;  Surgeon: Melrose Nakayama, MD;  Location: [place];  Service: Orthopedics;  Laterality: Left;  . TOTAL HIP ARTHROPLASTY Right 05/30/2020   Procedure: RIGHT TOTAL HIP ARTHROPLASTY ANTERIOR APPROACH;  Surgeon: Melrose Nakayama, MD;  Location: [facility];  Service: Orthopedics;  Laterality: Right;     Medications: No outpatient medications have been marked as taking for the 06/28/20 encounter (Appointment) with Burtis Junes, NP.     Allergies: No Known Allergies  Social History: The patient  reports that she quit smoking about 23 years ago. Her smoking use included cigarettes. She has a 5.00 pack-year smoking history. Her smokeless tobacco use includes chew. She reports that she does not drink alcohol and does not use drugs.   Family History: The patient's ***family history includes Cancer in her brother and sister; Coronary artery disease (age of onset: 64) in her mother; Diabetes in her brother and mother; Heart attack in her mother; Heart disease in her brother; Kidney disease in her brother; Stroke in her brother and mother.   Review of Systems: Please see the history of present illness.   All other systems are reviewed and negative.   Physical Exam: VS:  There were no vitals taken for this visit. Marland Kitchen  BMI There is no height or weight on file to calculate BMI.  Wt Readings from Last 3 Encounters:  06/18/20 138 lb 10.7 oz (62.9 kg)  06/08/20 138 lb 14.2 oz (63 kg)  05/30/20 139 lb 3.2 oz (63.1 kg)    General: Pleasant.  Well developed, well nourished and in no acute distress.   HEENT: Normal.  Neck: Supple, no JVD, carotid bruits, or masses noted.  Cardiac: ***Regular rate and rhythm. No murmurs, rubs, or gallops. No edema.  Respiratory:  Lungs are clear to auscultation bilaterally with normal work of breathing.  GI: Soft and nontender.  MS: No deformity or atrophy. Gait and ROM intact.  Skin: Warm and dry. Color is normal.  Neuro:  Strength and sensation are intact and no gross focal deficits noted.  Psych: Alert, appropriate and with normal affect.   LABORATORY DATA:  EKG:  EKG {ACTION; IS/IS [id number]} ordered today.  Personally reviewed by me. This demonstrates ***.  Lab Results  Component Value Date   WBC 8.4  06/17/2020   HGB 9.3 (L) 06/18/2020   HCT 29.8 (L) 06/18/2020   PLT 135 (L) 06/17/2020   GLUCOSE 91 06/17/2020   CHOL 166 05/13/2014   TRIG 140.0 05/13/2014   HDL 52.50 05/13/2014   LDLCALC 86 05/13/2014   ALT 24 06/14/2020   AST 34 06/14/2020   NA 147 (H) 06/17/2020   K 3.7 06/17/2020   CL 116 (H) 06/17/2020   CREATININE 0.90 06/17/2020   BUN 12 06/17/2020   CO2 21 (L) 06/17/2020   TSH 4.392 06/14/2020   INR 1.0 05/24/2020   HGBA1C 6.1 (H) 06/14/2020       BNP (last 3 results) Recent Labs    06/08/20 1630 06/14/20 1351  BNP 177.6* 171.3*    ProBNP (last 3 results) Recent Labs    07/19/19 1607 08/31/19 1052  PROBNP 1,614* 980*     Other Studies Reviewed Today:  ECHO IMPRESSIONS 07/2019  1. Left ventricular ejection fraction, by visual estimation, is 60 to  65%. The left ventricle has normal function. There is no left ventricular  hypertrophy.  2. Elevated left ventricular end-diastolic pressure.  3. Left ventricular diastolic parameters are consistent with Grade II  diastolic dysfunction (pseudonormalization).  4. Global right ventricle has normal systolic function.The right  ventricular size is normal. No increase in right ventricular wall    thickness.  5. Left atrial size was mildly dilated.  6. Right atrial size was normal.  7. The mitral valve is normal in structure. Mild mitral valve  regurgitation. No evidence of mitral stenosis.  8. The tricuspid valve is normal in structure. Tricuspid valve  regurgitation is mild.  9. The aortic valve is tricuspid. Aortic valve regurgitation is trivial.  Mild to moderate aortic valve sclerosis/calcification without any evidence  of aortic stenosis.  10. The pulmonic valve was normal in structure. Pulmonic valve  regurgitation is not visualized.  11. There is mild dilatation of the ascending aorta measuring 42 mm.  12. Moderately elevated pulmonary artery systolic pressure at [id number].  13. The inferior vena cava is normal in size with greater than 50%  respiratory variability, suggesting right atrial pressure of 3 mmHg.  14. The average left ventricular global longitudinal strain is -20.7 %.     ASSESSMENT & PLAN:   1. Shortness of breath/syncope/generalized weakness - this is felt to be due to acute blood loss anemia that has progressed - not able to transfuse as outpatient until tomorrow - I do not think that is the safest option for her and would favor admission instead. Have discussed her case with Dr. Tamala Julian here in the office as well who is in agreement. Would hold her antihypertensives for now. Cut aspirin back to QD. Transfuse and probably needs IV lasix to follow.   We can see her back after discharge - reassess her cardiac status and make sure no further testing from our standpoint is needed. Appointment has been made.   2. Recent hip replacement with presumed blood loss anemia - seen by ortho earlier today - apparently had a good report from him.   3. CAD - some vague chest pain - I suspect this is due from her anemia - she will need labs. EKG from the weekend stable.   4. Acute on chronic diastolic HF - most likely exacerbated by her anemia. See above.    5. HTN - BP soft - would favor holding medicines as needed.   6. [name] - not discussed  7. DM - not discussed.  1.  Symptomatic acute on chronic anemia, multifactorial, component of acute blood loss due to upper GI bleed, duodenal ulcers.  Patient was admitted to the medical ward, she was placed on a remote telemetry monitoring. Iron stores showed iron of 62, TIBC 300, transferrin saturation 21 and ferritin 156.  After 2 units of vitamin B12 deficiency but hemoglobin stabilized.  Because of melena/hematochezia aspirin was discontinued and patient was placed on pantoprazole.  Further work-up with endoscopy and colonoscopy showed: Mildly erythematous mucosa in the antrum, biopsies were taken to rule out H. pylori, positive to 1 nonbleeding duodenal ulcers, clean base with no stigmata of bleeding.  Cannot determine your polyp in the sigmoid colon which was removed.  Patient will continue taking pantoprazole twice daily for 1 month and then continue once daily, resume aspirin within 7 days. Her discharge hemoglobin is 9.3, hematocrit 29.8.  2.  Peripheral vascular disease, atherosclerotic vascular disease coronary artery disease status post bypass grafting, dyslipidemia.  Patient had chronic lower extremity claudication, further work-up with ABIs showed critical ischemia to the right lower extremity, ATA, PTA and peroneal arteries appear to be occluded.  Clinically does not have any signs of acute ischemia, normal temperature, normal skin appearance.  Patient was evaluated by vascular surgery, decision was to continue medical therapy and follow-up as an outpatient. Continue statin therapy, and resume aspirin within 7 days.  3.  Chronic diastolic heart failure.  No signs of acute exacerbation, patient will resume furosemide at discharge.  4.  Type 2 diabetes mellitus, controlled.  Her glucose remained controlled during her hospitalization. At discharge continue insulin therapy with 5  units of lantus.   5.  Hypertension.  Blood pressure remained controlled during her hospitalization. At discharge will resume benazepril metoprolol, spironolactone and furosemide.   6.  Rheumatoid arthritis/gout.  No signs of acute flare, continue hydroxychloroquine, prednisone and allopurinol.  7.  Acute kidney injury on chronic kidney disease stage II.  Patient received supportive medical therapy, including correction of anemia.  Her peak creatinine reached 1.57, at discharge is 0.90, potassium 3.7, bicarb 21, chloride 116.  Current medicines are reviewed with the patient today.  The patient does not have concerns regarding medicines other than what has been noted above.  The following changes have been made:  See above.  Labs/ tests ordered today include:   No orders of the defined types were placed in this encounter.    Disposition:   FU with *** in {gen number [id number]} {Days to years:10300}.   Patient is agreeable to this plan and will call if any problems develop in the interim.   SignedTruitt Merle, NP  06/19/2020 7:31 AM  [facility] [address] [place], [place]  [address] Phone: [phone] Fax: [phone]

## 2020-06-20 ENCOUNTER — Other Ambulatory Visit: Payer: Self-pay | Admitting: Physician Assistant

## 2020-06-20 LAB — SURGICAL PATHOLOGY

## 2020-06-21 ENCOUNTER — Other Ambulatory Visit: Payer: Self-pay

## 2020-06-21 ENCOUNTER — Telehealth: Payer: Self-pay | Admitting: Gastroenterology

## 2020-06-21 DIAGNOSIS — A048 Other specified bacterial intestinal infections: Secondary | ICD-10-CM

## 2020-06-21 MED ORDER — AMOXICILLIN 500 MG PO TABS: 1000.0000 mg | ORAL_TABLET | Freq: Two times a day (BID) | ORAL | 0 refills | Status: DC

## 2020-06-21 MED ORDER — METRONIDAZOLE 500 MG PO TABS: 500.0000 mg | ORAL_TABLET | Freq: Two times a day (BID) | ORAL | 0 refills | Status: DC

## 2020-06-21 MED ORDER — CLARITHROMYCIN 500 MG PO TABS: 500.0000 mg | ORAL_TABLET | Freq: Two times a day (BID) | ORAL | 0 refills | Status: DC

## 2020-06-21 NOTE — Telephone Encounter (Signed)
Pathology report and letter with results mailed.

## 2020-06-28 ENCOUNTER — Ambulatory Visit: Payer: Medicare Other | Admitting: Nurse Practitioner

## 2020-07-01 ENCOUNTER — Inpatient Hospital Stay (HOSPITAL_COMMUNITY)
Admission: EM | Admit: 2020-07-01 | Discharge: 2020-07-06 | DRG: 682 | Disposition: A | Discharge status: Skilled Nursing Facility | Payer: Medicare Other | Attending: Internal Medicine | Admitting: Internal Medicine

## 2020-07-01 ENCOUNTER — Emergency Department (HOSPITAL_COMMUNITY): Payer: Medicare Other

## 2020-07-01 ENCOUNTER — Other Ambulatory Visit: Payer: Self-pay

## 2020-07-01 ENCOUNTER — Encounter (HOSPITAL_COMMUNITY): Payer: Self-pay

## 2020-07-01 DIAGNOSIS — E1151 Type 2 diabetes mellitus with diabetic peripheral angiopathy without gangrene: Secondary | ICD-10-CM | POA: Diagnosis present

## 2020-07-01 DIAGNOSIS — R809 Proteinuria, unspecified: Secondary | ICD-10-CM | POA: Diagnosis present

## 2020-07-01 DIAGNOSIS — Z7952 Long term (current) use of systemic steroids: Secondary | ICD-10-CM

## 2020-07-01 DIAGNOSIS — Z951 Presence of aortocoronary bypass graft: Secondary | ICD-10-CM

## 2020-07-01 DIAGNOSIS — Z961 Presence of intraocular lens: Secondary | ICD-10-CM | POA: Diagnosis present

## 2020-07-01 DIAGNOSIS — R55 Syncope and collapse: Secondary | ICD-10-CM | POA: Diagnosis present

## 2020-07-01 DIAGNOSIS — Y92002 Bathroom of unspecified non-institutional (private) residence single-family (private) house as the place of occurrence of the external cause: Secondary | ICD-10-CM

## 2020-07-01 DIAGNOSIS — F419 Anxiety disorder, unspecified: Secondary | ICD-10-CM | POA: Diagnosis present

## 2020-07-01 DIAGNOSIS — K219 Gastro-esophageal reflux disease without esophagitis: Secondary | ICD-10-CM | POA: Diagnosis present

## 2020-07-01 DIAGNOSIS — R600 Localized edema: Secondary | ICD-10-CM | POA: Diagnosis present

## 2020-07-01 DIAGNOSIS — I10 Essential (primary) hypertension: Secondary | ICD-10-CM | POA: Diagnosis present

## 2020-07-01 DIAGNOSIS — K269 Duodenal ulcer, unspecified as acute or chronic, without hemorrhage or perforation: Secondary | ICD-10-CM | POA: Diagnosis present

## 2020-07-01 DIAGNOSIS — M1990 Unspecified osteoarthritis, unspecified site: Secondary | ICD-10-CM | POA: Diagnosis present

## 2020-07-01 DIAGNOSIS — R0609 Other forms of dyspnea: Secondary | ICD-10-CM | POA: Diagnosis present

## 2020-07-01 DIAGNOSIS — Z87891 Personal history of nicotine dependence: Secondary | ICD-10-CM

## 2020-07-01 DIAGNOSIS — D539 Nutritional anemia, unspecified: Secondary | ICD-10-CM

## 2020-07-01 DIAGNOSIS — Z90710 Acquired absence of both cervix and uterus: Secondary | ICD-10-CM

## 2020-07-01 DIAGNOSIS — W1839XA Other fall on same level, initial encounter: Secondary | ICD-10-CM | POA: Diagnosis present

## 2020-07-01 DIAGNOSIS — R531 Weakness: Secondary | ICD-10-CM | POA: Diagnosis not present

## 2020-07-01 DIAGNOSIS — Z833 Family history of diabetes mellitus: Secondary | ICD-10-CM

## 2020-07-01 DIAGNOSIS — I088 Other rheumatic multiple valve diseases: Secondary | ICD-10-CM | POA: Diagnosis present

## 2020-07-01 DIAGNOSIS — Z96643 Presence of artificial hip joint, bilateral: Secondary | ICD-10-CM | POA: Diagnosis present

## 2020-07-01 DIAGNOSIS — Z7982 Long term (current) use of aspirin: Secondary | ICD-10-CM

## 2020-07-01 DIAGNOSIS — W19XXXA Unspecified fall, initial encounter: Secondary | ICD-10-CM

## 2020-07-01 DIAGNOSIS — E1142 Type 2 diabetes mellitus with diabetic polyneuropathy: Secondary | ICD-10-CM | POA: Diagnosis present

## 2020-07-01 DIAGNOSIS — I959 Hypotension, unspecified: Secondary | ICD-10-CM | POA: Diagnosis present

## 2020-07-01 DIAGNOSIS — Z20822 Contact with and (suspected) exposure to covid-19: Secondary | ICD-10-CM | POA: Diagnosis present

## 2020-07-01 DIAGNOSIS — K264 Chronic or unspecified duodenal ulcer with hemorrhage: Secondary | ICD-10-CM | POA: Diagnosis present

## 2020-07-01 DIAGNOSIS — Z794 Long term (current) use of insulin: Secondary | ICD-10-CM

## 2020-07-01 DIAGNOSIS — Z9841 Cataract extraction status, right eye: Secondary | ICD-10-CM

## 2020-07-01 DIAGNOSIS — Z8701 Personal history of pneumonia (recurrent): Secondary | ICD-10-CM

## 2020-07-01 DIAGNOSIS — I5032 Chronic diastolic (congestive) heart failure: Secondary | ICD-10-CM | POA: Diagnosis present

## 2020-07-01 DIAGNOSIS — I70221 Atherosclerosis of native arteries of extremities with rest pain, right leg: Secondary | ICD-10-CM | POA: Diagnosis present

## 2020-07-01 DIAGNOSIS — I2510 Atherosclerotic heart disease of native coronary artery without angina pectoris: Secondary | ICD-10-CM | POA: Diagnosis present

## 2020-07-01 DIAGNOSIS — I82442 Acute embolism and thrombosis of left tibial vein: Secondary | ICD-10-CM | POA: Diagnosis present

## 2020-07-01 DIAGNOSIS — Z87440 Personal history of urinary (tract) infections: Secondary | ICD-10-CM

## 2020-07-01 DIAGNOSIS — M109 Gout, unspecified: Secondary | ICD-10-CM | POA: Diagnosis present

## 2020-07-01 DIAGNOSIS — M069 Rheumatoid arthritis, unspecified: Secondary | ICD-10-CM | POA: Diagnosis present

## 2020-07-01 DIAGNOSIS — E782 Mixed hyperlipidemia: Secondary | ICD-10-CM | POA: Diagnosis present

## 2020-07-01 DIAGNOSIS — Z79899 Other long term (current) drug therapy: Secondary | ICD-10-CM

## 2020-07-01 DIAGNOSIS — I82452 Acute embolism and thrombosis of left peroneal vein: Secondary | ICD-10-CM | POA: Diagnosis present

## 2020-07-01 DIAGNOSIS — Z8249 Family history of ischemic heart disease and other diseases of the circulatory system: Secondary | ICD-10-CM

## 2020-07-01 DIAGNOSIS — N179 Acute kidney failure, unspecified: Principal | ICD-10-CM | POA: Diagnosis present

## 2020-07-01 DIAGNOSIS — Z9842 Cataract extraction status, left eye: Secondary | ICD-10-CM

## 2020-07-01 DIAGNOSIS — I110 Hypertensive heart disease with heart failure: Secondary | ICD-10-CM | POA: Diagnosis present

## 2020-07-01 DIAGNOSIS — F32A Depression, unspecified: Secondary | ICD-10-CM | POA: Diagnosis present

## 2020-07-01 DIAGNOSIS — I739 Peripheral vascular disease, unspecified: Secondary | ICD-10-CM | POA: Diagnosis present

## 2020-07-01 DIAGNOSIS — I82462 Acute embolism and thrombosis of left calf muscular vein: Secondary | ICD-10-CM | POA: Diagnosis present

## 2020-07-01 DIAGNOSIS — B9681 Helicobacter pylori [H. pylori] as the cause of diseases classified elsewhere: Secondary | ICD-10-CM | POA: Diagnosis present

## 2020-07-01 DIAGNOSIS — E869 Volume depletion, unspecified: Secondary | ICD-10-CM | POA: Diagnosis present

## 2020-07-01 LAB — CBC WITH DIFFERENTIAL/PLATELET
Abs Immature Granulocytes: 0.16 10*3/uL — ABNORMAL HIGH (ref 0.00–0.07)
Basophils Absolute: 0 10*3/uL (ref 0.0–0.1)
Basophils Relative: 0 %
Eosinophils Absolute: 0 10*3/uL (ref 0.0–0.5)
Eosinophils Relative: 0 %
HCT: 31.8 % — ABNORMAL LOW (ref 36.0–46.0)
Hemoglobin: 10.2 g/dL — ABNORMAL LOW (ref 12.0–15.0)
Immature Granulocytes: 2 %
Lymphocytes Relative: 7 %
Lymphs Abs: 0.7 10*3/uL (ref 0.7–4.0)
MCH: 32.9 pg (ref 26.0–34.0)
MCHC: 32.1 g/dL (ref 30.0–36.0)
MCV: 102.6 fL — ABNORMAL HIGH (ref 80.0–100.0)
Monocytes Absolute: 0.6 10*3/uL (ref 0.1–1.0)
Monocytes Relative: 6 %
Neutro Abs: 8.6 10*3/uL — ABNORMAL HIGH (ref 1.7–7.7)
Neutrophils Relative %: 85 %
Platelets: 114 10*3/uL — ABNORMAL LOW (ref 150–400)
RBC: 3.1 MIL/uL — ABNORMAL LOW (ref 3.87–5.11)
RDW: 18.6 % — ABNORMAL HIGH (ref 11.5–15.5)
WBC: 10.1 10*3/uL (ref 4.0–10.5)
nRBC: 0 % (ref 0.0–0.2)

## 2020-07-01 LAB — BASIC METABOLIC PANEL
Anion gap: 9 (ref 5–15)
BUN: 34 mg/dL — ABNORMAL HIGH (ref 8–23)
CO2: 25 mmol/L (ref 22–32)
Calcium: 8.6 mg/dL — ABNORMAL LOW (ref 8.9–10.3)
Chloride: 105 mmol/L (ref 98–111)
Creatinine, Ser: 1.36 mg/dL — ABNORMAL HIGH (ref 0.44–1.00)
GFR, Estimated: 39 mL/min — ABNORMAL LOW (ref >60)
Glucose, Bld: 206 mg/dL — ABNORMAL HIGH (ref 70–99)
Potassium: 3.4 mmol/L — ABNORMAL LOW (ref 3.5–5.1)
Sodium: 139 mmol/L (ref 135–145)

## 2020-07-01 MED ORDER — LACTATED RINGERS IV BOLUS
500.0000 mL | Freq: Once | INTRAVENOUS | Status: AC
  Administered 2020-07-01: 500 mL via INTRAVENOUS

## 2020-07-01 NOTE — ED Triage Notes (Signed)
Per [facility], pt coming from home reporting mechanical fall to her knees. Pt  Has bleeding ulcers discharged from here a week ago. hgb 6 during that admission, 3-4 days ago pt started having tarry stools , SOB, and weakness. 18G in right [facility], recent hip surgery in September.

## 2020-07-01 NOTE — ED Provider Notes (Signed)
[facility] EMERGENCY DEPARTMENT Provider Note   CSN: [id number] Arrival date & time: 07/01/20  2053     History Chief Complaint  Patient presents with  . Fall  . GI Bleeding    Patricia Villegas is a 85 y.o. female.  The history is provided by the patient and medical records.  Trauma Mechanism of injury: fall Incident location: home   Fall:      Fall occurred: standing      Point of impact: knees  Current symptoms:      Associated symptoms:            Reports back pain.            Denies abdominal pain, chest pain, headache, nausea and vomiting.  Near Syncope This is a new problem. The current episode started 3 to 5 hours ago (with standing from toliet). The problem has been gradually improving. Pertinent negatives include no chest pain, no abdominal pain, no headaches and no shortness of breath.  Back Pain Pain location: sacral. Radiates to:  Does not radiate Pain severity:  Moderate Onset quality:  Sudden (after fall) Duration:  3 hours Timing:  Constant Progression:  Worsening Chronicity:  New Context: falling   Associated symptoms: no abdominal pain, no chest pain, no fever and no headaches         Past Medical History:  Diagnosis Date  . Anemia   . Anemia 06/14/2020  . Anxiety   . Arthritis   . Blood transfusion   . Chest pain    a. Lexiscan Myoview 12/12:  EF 75%, no ischemia or scar;  b.  Echo 12/12:  EF 60-65%, no sig valvular abnormalities   . Closed left fibular fracture    Casted   . Complication of anesthesia 1998   Unable to arouse fully, respiratory depression, pt. reports that the family was told that she would need a trach., but then she stabilized & didn't need it   . Coronary artery disease    sees Dr Aundra Dubin every 6 months  . Depression   . Diabetes mellitus    Greater than 20 years type 2   . GERD (gastroesophageal reflux disease)   . H/O hiatal hernia   . Hypercholesteremia   . Hypertension    Greater than 20  years  . Lumbar disc disease    S/p surgery x4;  Sciatica   . Neuropathy due to secondary diabetes ([place])    Hx: of  . Parastomal hernia of ileal conduit 2009  . Pneumonia    s/p CABG  . [name] (peripheral vascular disease) ([facility])    Stable claudication. Arteriogram 07/2009 no significant aortoiliac disease, has bilateral SFA occlusions with distal reconstitution   . Rheumatoid arthritis ([place])    Dr Berna Bue @ [facility]  . Transfusion reaction 1960   Convulsions (? seizure)  . UTI (lower urinary tract infection) 01/03/15    Patient Active Problem List   Diagnosis Date Noted  . Duodenal ulcer   . Benign neoplasm of sigmoid colon   . Gastrointestinal hemorrhage   . Anemia 06/15/2020  . Symptomatic anemia 06/14/2020  . Primary localized osteoarthritis of right hip 05/30/2020  . S/P total hip arthroplasty 05/30/2020  . Primary osteoarthritis of right hip 05/30/2020  . Carotid bruit 03/21/2016  . Primary osteoarthritis of left hip 01/10/2015  . Chronic diastolic CHF (congestive heart failure) ([place]) 06/30/2013  . CAD (coronary artery disease) of artery bypass graft 05/18/2013  .  Femoral artery occlusion, bilateral 05/18/2013  . Diabetes 1.5, managed as type 2 ([place]) 05/18/2013  . Rheumatoid arthritis ([place]) 05/18/2013  . Chest pain 09/18/2011  . Peripheral vascular disease ([place]) 09/18/2011  . Unstable angina ([place]) 08/23/2011  . Hypertension 08/23/2011  . Hyperlipidemia 08/23/2011    Past Surgical History:  Procedure Laterality Date  . ABDOMINAL HYSTERECTOMY    . BACK SURGERY     4 back operations most recently 1995, 1998  . BIOPSY  06/17/2020   Procedure: BIOPSY;  Surgeon: Yetta Flock, MD;  Location: [facility] ENDOSCOPY;  Service: Gastroenterology;;  . CARDIAC CATHETERIZATION     Hx: of 05/14/13  . CATARACT EXTRACTION W/ INTRAOCULAR LENS  IMPLANT, BILATERAL     Hx: of  . COLONOSCOPY     Hx; of  . COLONOSCOPY WITH PROPOFOL N/A 06/17/2020   Procedure: COLONOSCOPY  WITH PROPOFOL;  Surgeon: Yetta Flock, MD;  Location: [facility] ENDOSCOPY;  Service: Gastroenterology;  Laterality: N/A;  . CORONARY ARTERY BYPASS GRAFT N/A 05/24/2013   Procedure: CORONARY ARTERY BYPASS GRAFTING (CABG);  Surgeon: Melrose Nakayama, MD;  Location: [place];  Service: Open Heart Surgery;  Laterality: N/A;  . DILATION AND CURETTAGE OF UTERUS    . ESOPHAGOGASTRODUODENOSCOPY (EGD) WITH PROPOFOL N/A 06/17/2020   Procedure: ESOPHAGOGASTRODUODENOSCOPY (EGD) WITH PROPOFOL;  Surgeon: Yetta Flock, MD;  Location: [place];  Service: Gastroenterology;  Laterality: N/A;  . HEMOSTASIS CLIP PLACEMENT  06/17/2020   Procedure: HEMOSTASIS CLIP PLACEMENT;  Surgeon: Yetta Flock, MD;  Location: [place] ENDOSCOPY;  Service: Gastroenterology;;  . HERNIA REPAIR  2009  . LEFT HEART CATHETERIZATION WITH CORONARY ANGIOGRAM N/A 05/14/2013   Procedure: LEFT HEART CATHETERIZATION WITH CORONARY ANGIOGRAM;  Surgeon: Larey Dresser, MD;  Location[facility] CATH LAB;  Service: Cardiovascular;  Laterality: N/A;  . POLYPECTOMY  06/17/2020   Procedure: POLYPECTOMY;  Surgeon: Yetta Flock, MD;  Location: [facility] ENDOSCOPY;  Service: Gastroenterology;;  . TONSILLECTOMY    . TOTAL HIP ARTHROPLASTY Left 01/10/2015   Procedure: TOTAL HIP ARTHROPLASTY ANTERIOR APPROACH;  Surgeon: Melrose Nakayama, MD;  Location: [place];  Service: Orthopedics;  Laterality: Left;  . TOTAL HIP ARTHROPLASTY Right 05/30/2020   Procedure: RIGHT TOTAL HIP ARTHROPLASTY ANTERIOR APPROACH;  Surgeon: Melrose Nakayama, MD;  Location: [facility] ORS;  Service: Orthopedics;  Laterality: Right;     OB History   No obstetric history on file.     Family History  Problem Relation Age of Onset  . Coronary artery disease Mother 71  . Diabetes Mother   . Stroke Mother   . Heart attack Mother   . Cancer Brother   . Heart disease Brother   . Stroke Brother   . Kidney disease Brother   . Cancer Sister   . Diabetes Brother     Social History    Tobacco Use  . Smoking status: Former Smoker    Packs/day: 0.50    Years: 10.00    Pack years: 5.00    Types: Cigarettes    Quit date: 09/09/1996    Years since quitting: 23.8  . Smokeless tobacco: Current User    Types: Chew  Vaping Use  . Vaping Use: Never used  Substance Use Topics  . Alcohol use: No  . Drug use: No    Home Medications Prior to Admission medications   Medication Sig Start Date End Date Taking? Authorizing Provider  allopurinol (ZYLOPRIM) 100 MG tablet Take 200 mg by mouth daily.  10/26/19  Yes [provider]  amoxicillin (AMOXIL) 500 MG tablet Take 2 tablets (1,000 mg total) by mouth 2 (two) times daily for 14 days. 06/21/20 07/05/20 Yes Armbruster, Carlota Raspberry, MD  aspirin EC 81 MG tablet Take 1 tablet (81 mg total) by mouth daily. Please resume on 06/26/20 06/18/20  Yes Arrien, Jimmy Picket, MD  benazepril (LOTENSIN) 20 MG tablet Take 20 mg by mouth daily. 08/02/13  Yes Larey Dresser, MD  clarithromycin (BIAXIN) 500 MG tablet Take 1 tablet (500 mg total) by mouth 2 (two) times daily for 14 days. 06/21/20 07/05/20 Yes Armbruster, Carlota Raspberry, MD  feeding supplement, ENSURE ENLIVE, (ENSURE ENLIVE) LIQD Take 237 mLs by mouth 2 (two) times daily between meals. Patient taking differently: Take 237 mLs by mouth 2 (two) times daily as needed (supplement).  06/18/20 07/18/20 Yes Arrien, Jimmy Picket, MD  folic acid (FOLVITE) 1 MG tablet Take 2 mg by mouth daily. 05/10/20  Yes [provider]  furosemide (LASIX) 40 MG tablet Take 40 mg by mouth daily.    Yes [provider]  HYDROcodone-acetaminophen (NORCO/VICODIN) 5-325 MG tablet Take 1-2 tablets by mouth every 6 (six) hours as needed for moderate pain or severe pain. 05/30/20  Yes Loni Dolly, PA-C  hydroxychloroquine (PLAQUENIL) 200 MG tablet Take 200 mg by mouth 2 (two) times daily. 10/26/19  Yes [provider]  LANTUS SOLOSTAR 100 UNIT/ML Solostar Pen Inject 10 Units into the  skin every evening.  09/01/19  Yes [provider]  metoprolol tartrate (LOPRESSOR) 25 MG tablet Take 0.5 tablets (12.5 mg total) by mouth 2 (two) times daily. 07/19/19  Yes Sande Rives E, PA-C  metroNIDAZOLE (FLAGYL) 500 MG tablet Take 1 tablet (500 mg total) by mouth 2 (two) times daily for 14 days. 06/21/20 07/05/20 Yes Armbruster, Carlota Raspberry, MD  pantoprazole (PROTONIX) 40 MG tablet Take 1 tablet (40 mg total) by mouth 2 (two) times daily. Take twice daily for one month then continue with once daily. 06/18/20 07/18/20 Yes Arrien, Jimmy Picket, MD  predniSONE (DELTASONE) 5 MG tablet Take 5 mg by mouth daily with breakfast.    Yes [provider]  senna-docusate (SENOKOT-S) 8.6-50 MG tablet Take 1 tablet by mouth daily as needed for mild constipation.   Yes [provider]  simvastatin (ZOCOR) 80 MG tablet Take 80 mg by mouth at bedtime.     Yes [provider]  spironolactone (ALDACTONE) 25 MG tablet Take 0.5 tablets (12.5 mg total) by mouth daily. 07/28/19  Yes Belva Crome, MD  methotrexate (RHEUMATREX) 2.5 MG tablet Caution:Chemotherapy. Protect from light. Please resume your regular dose as before admission to the hospital. 06/18/20   Arrien, Jimmy Picket, MD  ONE TOUCH ULTRA TEST test strip  06/24/13   [provider]  tiZANidine (ZANAFLEX) 2 MG tablet Take 1-2 tablets (2-4 mg total) by mouth every 6 (six) hours as needed for muscle spasms. Patient not taking: Reported on 07/01/2020 05/30/20 05/30/21  Loni Dolly, PA-C    Allergies    Patient has no known allergies.  Review of Systems   Review of Systems  Constitutional: Negative for chills and fever.  HENT: Negative for congestion and sore throat.   Respiratory: Negative for shortness of breath.   Cardiovascular: Positive for near-syncope. Negative for chest pain.  Gastrointestinal: Positive for blood in stool. Negative for abdominal pain, nausea and vomiting.       Melena   Genitourinary: Negative for difficulty urinating.  Musculoskeletal: Positive for back pain.  R hip pain and leg pain  Neurological: Positive for light-headedness. Negative for headaches.  All other systems reviewed and are negative.   Physical Exam Updated Vital Signs BP (!) 109/46   Pulse 69   Temp 97.8 F (36.6 C) (Oral)   Resp 17   Ht 5\' 2"  (1.575 m)   Wt 63 kg   SpO2 99%   BMI 25.42 kg/m   Physical Exam Vitals reviewed. Exam conducted with a chaperone present.  Constitutional:      Appearance: Normal appearance.  HENT:     Head: Normocephalic and atraumatic.     Nose: Nose normal.     Mouth/Throat:     Mouth: Mucous membranes are moist.  Eyes:     Conjunctiva/sclera: Conjunctivae normal.  Cardiovascular:     Heart sounds: Normal heart sounds.  Pulmonary:     Effort: Pulmonary effort is normal.     Breath sounds: Normal breath sounds.  Abdominal:     General: Abdomen is flat.     Palpations: Abdomen is soft.     Tenderness: There is no abdominal tenderness.  Genitourinary:    Comments: NO stool on rectal, no obvious bleeding Musculoskeletal:     Cervical back: Neck supple.     Right lower leg: Edema present.     Left lower leg: Edema present.  Skin:    General: Skin is warm and dry.     Capillary Refill: Capillary refill takes less than 2 seconds.     Comments: R hip surgical incision well healing, no drainage or erythema  Neurological:     Mental Status: She is alert.     Motor: Weakness present.     Comments: RLE weakness at hip flexor  Psychiatric:        Mood and Affect: Mood normal.        Behavior: Behavior normal.     ED Results / Procedures / Treatments   Labs (all labs ordered are listed, but only abnormal results are displayed) Labs Reviewed  CBC WITH DIFFERENTIAL/PLATELET - Abnormal; Notable for the following components:      Result Value   RBC 3.10 (*)    Hemoglobin 10.2 (*)    HCT 31.8 (*)    MCV 102.6 (*)    RDW 18.6 (*)     Platelets 114 (*)    Neutro Abs 8.6 (*)    Abs Immature Granulocytes 0.16 (*)    All other components within normal limits  BASIC METABOLIC PANEL - Abnormal; Notable for the following components:   Potassium 3.4 (*)    Glucose, Bld 206 (*)    BUN 34 (*)    Creatinine, Ser 1.36 (*)    Calcium 8.6 (*)    GFR, Estimated 39 (*)    All other components within normal limits  OCCULT BLOOD X 1 CARD TO LAB, STOOL    EKG EKG Interpretation  Date/Time:  Saturday July 01 2020 23:29:43 EDT Ventricular Rate:  68 PR Interval:    QRS Duration: 144 QT Interval:  502 QTC Calculation: 534 R Axis:   -31 Text Interpretation: Sinus or ectopic atrial rhythm Borderline prolonged PR interval Left bundle branch block Baseline wander in lead(s) V3 similar to prior. No STEMI Confirmed by Antony Blackbird [phone]) on 07/01/2020 11:51:23 PM   Radiology CT Lumbar Spine Wo Contrast  Result Date: 07/01/2020 CLINICAL DATA:  Lower back pain, trauma EXAM: CT LUMBAR SPINE WITHOUT CONTRAST TECHNIQUE: Multidetector CT imaging of the lumbar spine was performed  without intravenous contrast administration. Multiplanar CT image reconstructions were also generated. COMPARISON:  None. FINDINGS: Segmentation: There are 5 non-rib bearing lumbar type vertebral bodies with the last intervertebral disc space labeled as L5-S1. Alignment: There is a minimal leftward curvature of the lumbar spine. Vertebrae: There is an age indeterminate, however likely subacute compression fracture at the inferior endplate of L1 with less than 25% loss in height. There is mildly displaced fracture seen through the anteroinferior right-sided endplate. There is buckling of the posteroinferior endplate causing mild canal narrowing. There is diffuse osteopenia. Interbody fixation device is seen at L4-L5. Paraspinal and other soft tissues: The paraspinal soft tissues and visualized retroperitoneal structures are unremarkable. The sacroiliac joints are  intact. Scattered dense vascular calcifications are noted. Low-density lesion seen partially exophytic the upper pole scattered colonic diverticula noted Disc levels: Multilevel lumbar spine spondylosis is seen most notable at L5-S1 with facet arthropathy causing severe right and moderate left neural foraminal narrowing IMPRESSION: Age indeterminate, however likely subacute inferior compression fracture of the L1 vertebral body with less than 25% height. There is buckling of the posteroinferior cortex causing mild canal narrowing Electronically Signed   By: Prudencio Pair M.D.   On: 07/01/2020 22:59    Procedures Procedures (including critical care time)  Medications Ordered in ED Medications  lactated ringers bolus 500 mL (0 mLs Intravenous Stopped 07/01/20 2249)  lactated ringers bolus 500 mL (500 mLs Intravenous New Bag/Given 07/01/20 2331)    ED Course  I have reviewed the triage vital signs and the nursing notes.  Pertinent labs & imaging results that were available during my care of the patient were reviewed by me and considered in my medical decision making (see chart for details).    MDM Rules/Calculators/A&P                          Medical Decision Making: Patricia Villegas is a 85 y.o. female who presented to the ED today with fall today. Pt denies head injury or LOC. Pt reports feeling light headed when she stood from toilet and that legs gave out on her and she fell forward off toilet and is unsure how she landed but denies head injury or LOC.  Past medical history significant for PMHx T2DM, HTN, CKD stage II, [name], chronic anemia with recent admission for acute symptomatic anemia 2/2 duodenal ulcers   Pt reports hip surgery 1 month ago, has been in PT, is currently on anticoagulation. Pt reports worse lower back pain since fall today, and ongoing pain in R hip/thigh, stable since fall.  Pt reports 1 week of tarry/ black stools and worsening weakness and lightheadedness with standing.   Reviewed and confirmed nursing documentation for past medical history, family history, social history. CT lumbar with Age indeterminate, however likely subacute inferior compression fracture of the L1 vertebral body  X-rays pelvis and femur pending.  EKG with sinus rhythm, LBBB, similar to prior, no STEMI.  Hgb 10.2, stable from prior, recommend trending given pt reported bleeding. NO indication for transfusion at this time   Pt hypotensive on re-evaluation, Systolic 88,  given LR bolus.    PLAN:  - pt will likely need admission for high risk syncope - follow up xrays  Pt care handed off to oncoming ED attending Dr. Leonette Monarch.   The above care was discussed with and agreed upon by my attending physician.   Emergency Department Medication Summary:  Medications  lactated ringers bolus 500  mL (0 mLs Intravenous Stopped 07/01/20 2249)  lactated ringers bolus 500 mL (500 mLs Intravenous New Bag/Given 07/01/20 2331)        Final Clinical Impression(s) / ED Diagnoses Final diagnoses:  Fall, initial encounter    Rx / DC Orders ED Discharge Orders    None       Roosevelt Locks, MD 07/02/20 0000    Tegeler, Gwenyth Allegra, MD 07/03/20 [phone]

## 2020-07-02 ENCOUNTER — Observation Stay (HOSPITAL_COMMUNITY): Payer: Medicare Other

## 2020-07-02 ENCOUNTER — Encounter (HOSPITAL_COMMUNITY): Payer: Medicare Other

## 2020-07-02 DIAGNOSIS — R9431 Abnormal electrocardiogram [ECG] [EKG]: Secondary | ICD-10-CM | POA: Diagnosis not present

## 2020-07-02 DIAGNOSIS — I10 Essential (primary) hypertension: Secondary | ICD-10-CM

## 2020-07-02 DIAGNOSIS — D539 Nutritional anemia, unspecified: Secondary | ICD-10-CM

## 2020-07-02 DIAGNOSIS — I739 Peripheral vascular disease, unspecified: Secondary | ICD-10-CM

## 2020-07-02 DIAGNOSIS — I5032 Chronic diastolic (congestive) heart failure: Secondary | ICD-10-CM | POA: Diagnosis not present

## 2020-07-02 DIAGNOSIS — I2510 Atherosclerotic heart disease of native coronary artery without angina pectoris: Secondary | ICD-10-CM

## 2020-07-02 DIAGNOSIS — R531 Weakness: Secondary | ICD-10-CM | POA: Diagnosis not present

## 2020-07-02 DIAGNOSIS — R600 Localized edema: Secondary | ICD-10-CM | POA: Diagnosis present

## 2020-07-02 DIAGNOSIS — E1142 Type 2 diabetes mellitus with diabetic polyneuropathy: Secondary | ICD-10-CM

## 2020-07-02 DIAGNOSIS — K269 Duodenal ulcer, unspecified as acute or chronic, without hemorrhage or perforation: Secondary | ICD-10-CM

## 2020-07-02 DIAGNOSIS — I340 Nonrheumatic mitral (valve) insufficiency: Secondary | ICD-10-CM | POA: Diagnosis not present

## 2020-07-02 DIAGNOSIS — I361 Nonrheumatic tricuspid (valve) insufficiency: Secondary | ICD-10-CM

## 2020-07-02 DIAGNOSIS — E782 Mixed hyperlipidemia: Secondary | ICD-10-CM

## 2020-07-02 DIAGNOSIS — Z794 Long term (current) use of insulin: Secondary | ICD-10-CM

## 2020-07-02 LAB — URINALYSIS, COMPLETE (UACMP) WITH MICROSCOPIC
Bilirubin Urine: NEGATIVE
Glucose, UA: NEGATIVE mg/dL
Ketones, ur: NEGATIVE mg/dL
Nitrite: NEGATIVE
Protein, ur: NEGATIVE mg/dL
Specific Gravity, Urine: 1.01 (ref 1.005–1.030)
pH: 6 (ref 5.0–8.0)

## 2020-07-02 LAB — CBG MONITORING, ED
Glucose-Capillary: 91 mg/dL (ref 70–99)
Glucose-Capillary: 83 mg/dL (ref 70–99)

## 2020-07-02 LAB — BRAIN NATRIURETIC PEPTIDE: B Natriuretic Peptide: 297 pg/mL — ABNORMAL HIGH (ref 0.0–100.0)

## 2020-07-02 LAB — ECHOCARDIOGRAM COMPLETE
Area-P 1/2: 1.94 cm2
Height: 62 in
MV M vel: 5.47 m/s
MV Peak grad: 119.7 mmHg
S' Lateral: 2.6 cm
Weight: 2224 oz

## 2020-07-02 LAB — GLUCOSE, CAPILLARY
Glucose-Capillary: 99 mg/dL (ref 70–99)
Glucose-Capillary: 161 mg/dL — ABNORMAL HIGH (ref 70–99)
Glucose-Capillary: 155 mg/dL — ABNORMAL HIGH (ref 70–99)

## 2020-07-02 LAB — CBC
HCT: 29.8 % — ABNORMAL LOW (ref 36.0–46.0)
Hemoglobin: 9.6 g/dL — ABNORMAL LOW (ref 12.0–15.0)
MCH: 33 pg (ref 26.0–34.0)
MCHC: 32.2 g/dL (ref 30.0–36.0)
MCV: 102.4 fL — ABNORMAL HIGH (ref 80.0–100.0)
Platelets: 121 10*3/uL — ABNORMAL LOW (ref 150–400)
RBC: 2.91 MIL/uL — ABNORMAL LOW (ref 3.87–5.11)
RDW: 18.5 % — ABNORMAL HIGH (ref 11.5–15.5)
WBC: 9.2 10*3/uL (ref 4.0–10.5)
nRBC: 0 % (ref 0.0–0.2)

## 2020-07-02 LAB — HEPATIC FUNCTION PANEL
ALT: 44 U/L (ref 0–44)
AST: 69 U/L — ABNORMAL HIGH (ref 15–41)
Albumin: 2.9 g/dL — ABNORMAL LOW (ref 3.5–5.0)
Alkaline Phosphatase: 39 U/L (ref 38–126)
Bilirubin, Direct: 0.1 mg/dL (ref 0.0–0.2)
Indirect Bilirubin: 0.5 mg/dL (ref 0.3–0.9)
Total Bilirubin: 0.6 mg/dL (ref 0.3–1.2)
Total Protein: 5.2 g/dL — ABNORMAL LOW (ref 6.5–8.1)

## 2020-07-02 LAB — TROPONIN I (HIGH SENSITIVITY): Troponin I (High Sensitivity): 59 ng/L — ABNORMAL HIGH (ref <18)

## 2020-07-02 LAB — RESPIRATORY PANEL BY RT PCR (FLU A&B, COVID)
Influenza A by PCR: NEGATIVE
Influenza B by PCR: NEGATIVE
SARS Coronavirus 2 by RT PCR: NEGATIVE

## 2020-07-02 LAB — MAGNESIUM: Magnesium: 1.9 mg/dL (ref 1.7–2.4)

## 2020-07-02 MED ORDER — FOLIC ACID 1 MG PO TABS
2.0000 mg | ORAL_TABLET | Freq: Every day | ORAL | Status: DC
  Administered 2020-07-02 – 2020-07-06 (×5): 2 mg via ORAL
  Filled 2020-07-02 (×5): qty 2

## 2020-07-02 MED ORDER — CLARITHROMYCIN 500 MG PO TABS
500.0000 mg | ORAL_TABLET | Freq: Two times a day (BID) | ORAL | Status: AC
  Administered 2020-07-02 – 2020-07-04 (×6): 500 mg via ORAL
  Filled 2020-07-02 (×6): qty 1

## 2020-07-02 MED ORDER — ASPIRIN EC 81 MG PO TBEC
81.0000 mg | DELAYED_RELEASE_TABLET | Freq: Every day | ORAL | Status: DC
  Administered 2020-07-02 – 2020-07-06 (×5): 81 mg via ORAL
  Filled 2020-07-02 (×5): qty 1

## 2020-07-02 MED ORDER — PREDNISONE 5 MG PO TABS
5.0000 mg | ORAL_TABLET | Freq: Every day | ORAL | Status: DC
  Administered 2020-07-03 – 2020-07-06 (×4): 5 mg via ORAL
  Filled 2020-07-02 (×5): qty 1

## 2020-07-02 MED ORDER — AMOXICILLIN 500 MG PO CAPS
1000.0000 mg | ORAL_CAPSULE | Freq: Two times a day (BID) | ORAL | Status: AC
  Administered 2020-07-02 – 2020-07-04 (×6): 1000 mg via ORAL
  Filled 2020-07-02 (×6): qty 2

## 2020-07-02 MED ORDER — ONDANSETRON HCL 4 MG/2ML IJ SOLN: 4.0000 mg | Freq: Four times a day (QID) | INTRAMUSCULAR | Status: DC | PRN

## 2020-07-02 MED ORDER — LACTATED RINGERS IV SOLN: INTRAVENOUS | Status: AC

## 2020-07-02 MED ORDER — LACTATED RINGERS IV SOLN
INTRAVENOUS | Status: AC
  Administered 2020-07-02: 100 mL/h via INTRAVENOUS

## 2020-07-02 MED ORDER — ONDANSETRON HCL 4 MG PO TABS: 4.0000 mg | ORAL_TABLET | Freq: Four times a day (QID) | ORAL | Status: DC | PRN

## 2020-07-02 MED ORDER — ACETAMINOPHEN 325 MG PO TABS: 650.0000 mg | ORAL_TABLET | Freq: Four times a day (QID) | ORAL | Status: DC | PRN

## 2020-07-02 MED ORDER — INSULIN GLARGINE 100 UNIT/ML ~~LOC~~ SOLN
10.0000 [IU] | Freq: Every evening | SUBCUTANEOUS | Status: DC
  Administered 2020-07-02 – 2020-07-03 (×2): 10 [IU] via SUBCUTANEOUS
  Filled 2020-07-02 (×3): qty 0.1

## 2020-07-02 MED ORDER — POTASSIUM CHLORIDE CRYS ER 20 MEQ PO TBCR
40.0000 meq | EXTENDED_RELEASE_TABLET | Freq: Once | ORAL | Status: AC
  Administered 2020-07-02: 40 meq via ORAL
  Filled 2020-07-02: qty 2

## 2020-07-02 MED ORDER — HYDROXYCHLOROQUINE SULFATE 200 MG PO TABS
200.0000 mg | ORAL_TABLET | Freq: Two times a day (BID) | ORAL | Status: DC
  Administered 2020-07-02 – 2020-07-06 (×9): 200 mg via ORAL
  Filled 2020-07-02 (×9): qty 1

## 2020-07-02 MED ORDER — PANTOPRAZOLE SODIUM 40 MG PO TBEC
40.0000 mg | DELAYED_RELEASE_TABLET | Freq: Two times a day (BID) | ORAL | Status: DC
  Administered 2020-07-02 – 2020-07-06 (×9): 40 mg via ORAL
  Filled 2020-07-02 (×9): qty 1

## 2020-07-02 MED ORDER — METRONIDAZOLE 500 MG PO TABS
500.0000 mg | ORAL_TABLET | Freq: Two times a day (BID) | ORAL | Status: AC
  Administered 2020-07-02 – 2020-07-04 (×6): 500 mg via ORAL
  Filled 2020-07-02 (×6): qty 1

## 2020-07-02 MED ORDER — INSULIN ASPART 100 UNIT/ML ~~LOC~~ SOLN
0.0000 [IU] | Freq: Three times a day (TID) | SUBCUTANEOUS | Status: DC
  Administered 2020-07-02 (×2): 3 [IU] via SUBCUTANEOUS
  Administered 2020-07-03: 2 [IU] via SUBCUTANEOUS
  Administered 2020-07-04: 5 [IU] via SUBCUTANEOUS

## 2020-07-02 MED ORDER — ATORVASTATIN CALCIUM 40 MG PO TABS
40.0000 mg | ORAL_TABLET | Freq: Every day | ORAL | Status: DC
  Administered 2020-07-02 – 2020-07-06 (×5): 40 mg via ORAL
  Filled 2020-07-02 (×5): qty 1

## 2020-07-02 MED ORDER — ALLOPURINOL 100 MG PO TABS
200.0000 mg | ORAL_TABLET | Freq: Every day | ORAL | Status: DC
  Administered 2020-07-02 – 2020-07-06 (×5): 200 mg via ORAL
  Filled 2020-07-02 (×5): qty 2

## 2020-07-02 MED ORDER — POLYETHYLENE GLYCOL 3350 17 G PO PACK: 17.0000 g | PACK | Freq: Every day | ORAL | Status: DC | PRN

## 2020-07-02 MED ORDER — ENSURE ENLIVE PO LIQD
237.0000 mL | Freq: Two times a day (BID) | ORAL | Status: DC
  Administered 2020-07-02 – 2020-07-06 (×7): 237 mL via ORAL
  Filled 2020-07-02: qty 237

## 2020-07-02 MED ORDER — METOPROLOL TARTRATE 12.5 MG HALF TABLET
12.5000 mg | ORAL_TABLET | Freq: Two times a day (BID) | ORAL | Status: DC
  Administered 2020-07-02 – 2020-07-06 (×8): 12.5 mg via ORAL
  Filled 2020-07-02 (×8): qty 1

## 2020-07-02 MED ORDER — ACETAMINOPHEN 650 MG RE SUPP: 650.0000 mg | Freq: Four times a day (QID) | RECTAL | Status: DC | PRN

## 2020-07-02 MED ORDER — HYDROCODONE-ACETAMINOPHEN 5-325 MG PO TABS
1.0000 | ORAL_TABLET | Freq: Four times a day (QID) | ORAL | Status: DC | PRN
  Filled 2020-07-02: qty 1

## 2020-07-02 NOTE — H&P (Signed)
History and Physical    Patricia Villegas:[id number] DOB: [birth year] DOA: 07/01/2020  PCP: Haywood Pao, MD  Patient coming from: Home   Chief Complaint:  Chief Complaint  Patient presents with  . Fall  . GI Bleeding     HPI:    85 year old female with past medical history of coronary artery disease (CABG 2014), diabetes mellitus type 2, diabetic polyneuropathy, hyperlipidemia, hypertension, diastolic congestive heart failure (Echo 07/2019 EF 60-65%), rheumatoid arthritis, gout, peripheral vascular disease and recent diagnosis of bleeding duodenal ulcers with Helicobacter pylori presenting to [facility] emergency department with complaints of severe weakness.  Of note, patient was hospitalized at [facility] from 10/6-10/10 for upper gastrointestinal bleeding.  Patient received 2 units of packed red blood cell transfusion.  Patient underwent gastroenterology consultation with both EGD and colonoscopy.  EGD revealed 2 duodenal ulcers with path report after discharge eventually revealing Helicobacter pylori for which the patient has since been placed on metronidazole, clarithromycin and amoxicillin.  Patient explains that shortly after her discharge from [facility] on 10/10, the generalized weakness she had developed secondary her to her anemia gradually improved with home health physical therapy.    However, several days after that the patient developed a recurrence of her generalized weakness.  Patient states that her weakness is nonfocal and was initially mild but progressively became more and more severe as the days progressed.  Patient states that in addition to her generalized weakness, she has developed dyspnea on exertion and occasional cough, sometimes productive with scant amounts of clear sputum.  Upon further questioning, patient also complains of regular bouts of black stools.  Patient denies any associated abdominal pain, dysuria or diarrhea.  Upon further  questioning patient also denies any changes in appetite, paroxysmal nocturnal dyspnea, pillow orthopnea, sick contacts or fever.  Patient symptoms continue to worsen until she experienced an episode of extreme lightheadedness and fall without loss of consciousness prompting her to call EMS and be brought to [facility] emergency department for evaluation.    Upon evaluation in the emergency department patient was found to be extremely weak and initially hypotensive with systolic blood pressures in the 90s.  Patient was provided with 1 L of isotonic fluids without significant improvement in weakness and therefore the hospitalist group was called to assess the patient for admission to the hospital. Review of Systems:   Review of Systems  Constitutional: Positive for malaise/fatigue.  Respiratory: Positive for cough, sputum production and shortness of breath.   Cardiovascular: Positive for leg swelling.  Gastrointestinal: Positive for melena.  Neurological: Positive for weakness.  All other systems reviewed and are negative.   Past Medical History:  Diagnosis Date  . Anemia   . Anemia 06/14/2020  . Anxiety   . Arthritis   . Blood transfusion   . Chest pain    a. Lexiscan Myoview 12/12:  EF 75%, no ischemia or scar;  b.  Echo 12/12:  EF 60-65%, no sig valvular abnormalities   . Closed left fibular fracture    Casted   . Complication of anesthesia 1998   Unable to arouse fully, respiratory depression, pt. reports that the family was told that she would need a trach., but then she stabilized & didn't need it   . Coronary artery disease    sees Dr Aundra Dubin every 6 months  . Depression   . Diabetes mellitus    Greater than 20 years type 2   . GERD (gastroesophageal reflux disease)   .  H/O hiatal hernia   . Hypercholesteremia   . Hypertension    Greater than 20 years  . Lumbar disc disease    S/p surgery x4;  Sciatica   . Neuropathy due to secondary diabetes ([place])    Hx: of  .  Parastomal hernia of ileal conduit 2009  . Pneumonia    s/p CABG  . [name] (peripheral vascular disease) (HCC)    Stable claudication. Arteriogram 07/2009 no significant aortoiliac disease, has bilateral SFA occlusions with distal reconstitution   . Rheumatoid arthritis ([place])    Dr Berna Bue @ [facility]  . Transfusion reaction 1960   Convulsions (? seizure)  . UTI (lower urinary tract infection) 01/03/15    Past Surgical History:  Procedure Laterality Date  . ABDOMINAL HYSTERECTOMY    . BACK SURGERY     4 back operations most recently 1995, 1998  . BIOPSY  06/17/2020   Procedure: BIOPSY;  Surgeon: Yetta Flock, MD;  Location: [facility] ENDOSCOPY;  Service: Gastroenterology;;  . CARDIAC CATHETERIZATION     Hx: of 05/14/13  . CATARACT EXTRACTION W/ INTRAOCULAR LENS  IMPLANT, BILATERAL     Hx: of  . COLONOSCOPY     Hx; of  . COLONOSCOPY WITH PROPOFOL N/A 06/17/2020   Procedure: COLONOSCOPY WITH PROPOFOL;  Surgeon: Yetta Flock, MD;  Location: [facility] ENDOSCOPY;  Service: Gastroenterology;  Laterality: N/A;  . CORONARY ARTERY BYPASS GRAFT N/A 05/24/2013   Procedure: CORONARY ARTERY BYPASS GRAFTING (CABG);  Surgeon: Melrose Nakayama, MD;  Location: [place];  Service: Open Heart Surgery;  Laterality: N/A;  . DILATION AND CURETTAGE OF UTERUS    . ESOPHAGOGASTRODUODENOSCOPY (EGD) WITH PROPOFOL N/A 06/17/2020   Procedure: ESOPHAGOGASTRODUODENOSCOPY (EGD) WITH PROPOFOL;  Surgeon: Yetta Flock, MD;  Location: [place];  Service: Gastroenterology;  Laterality: N/A;  . HEMOSTASIS CLIP PLACEMENT  06/17/2020   Procedure: HEMOSTASIS CLIP PLACEMENT;  Surgeon: Yetta Flock, MD;  Location: [place] ENDOSCOPY;  Service: Gastroenterology;;  . HERNIA REPAIR  2009  . LEFT HEART CATHETERIZATION WITH CORONARY ANGIOGRAM N/A 05/14/2013   Procedure: LEFT HEART CATHETERIZATION WITH CORONARY ANGIOGRAM;  Surgeon: Larey Dresser, MD;  Location[facility] C[facility] LAB;  Service: Cardiovascular;   Laterality: N/A;  . POLYPECTOMY  06/17/2020   Procedure: POLYPECTOMY;  Surgeon: Yetta Flock, MD;  Location: [facility] ENDOSCOPY;  Service: Gastroenterology;;  . TONSILLECTOMY    . TOTAL HIP ARTHROPLASTY Left 01/10/2015   Procedure: TOTAL HIP ARTHROPLASTY ANTERIOR APPROACH;  Surgeon: Melrose Nakayama, MD;  Location: [place];  Service: Orthopedics;  Laterality: Left;  . TOTAL HIP ARTHROPLASTY Right 05/30/2020   Procedure: RIGHT TOTAL HIP ARTHROPLASTY ANTERIOR APPROACH;  Surgeon: Melrose Nakayama, MD;  Location: [facility] ORS;  Service: Orthopedics;  Laterality: Right;     reports that she quit smoking about 23 years ago. Her smoking use included cigarettes. She has a 5.00 pack-year smoking history. Her smokeless tobacco use includes chew. She reports that she does not drink alcohol and does not use drugs.  No Known Allergies  Family History  Problem Relation Age of Onset  . Coronary artery disease Mother 75  . Diabetes Mother   . Stroke Mother   . Heart attack Mother   . Cancer Brother   . Heart disease Brother   . Stroke Brother   . Kidney disease Brother   . Cancer Sister   . Diabetes Brother      Prior to Admission medications   Medication Sig Start Date End Date Taking? Authorizing Provider  allopurinol (ZYLOPRIM) 100 MG tablet Take 200 mg by mouth daily.  10/26/19  Yes [provider]  amoxicillin (AMOXIL) 500 MG tablet Take 2 tablets (1,000 mg total) by mouth 2 (two) times daily for 14 days. 06/21/20 07/05/20 Yes Armbruster, Carlota Raspberry, MD  aspirin EC 81 MG tablet Take 1 tablet (81 mg total) by mouth daily. Please resume on 06/26/20 06/18/20  Yes Arrien, Jimmy Picket, MD  benazepril (LOTENSIN) 20 MG tablet Take 20 mg by mouth daily. 08/02/13  Yes Larey Dresser, MD  clarithromycin (BIAXIN) 500 MG tablet Take 1 tablet (500 mg total) by mouth 2 (two) times daily for 14 days. 06/21/20 07/05/20 Yes Armbruster, Carlota Raspberry, MD  feeding supplement, ENSURE ENLIVE, (ENSURE ENLIVE) LIQD Take  237 mLs by mouth 2 (two) times daily between meals. Patient taking differently: Take 237 mLs by mouth 2 (two) times daily as needed (supplement).  06/18/20 07/18/20 Yes Arrien, Jimmy Picket, MD  folic acid (FOLVITE) 1 MG tablet Take 2 mg by mouth daily. 05/10/20  Yes [provider]  furosemide (LASIX) 40 MG tablet Take 40 mg by mouth daily.    Yes [provider]  HYDROcodone-acetaminophen (NORCO/VICODIN) 5-325 MG tablet Take 1-2 tablets by mouth every 6 (six) hours as needed for moderate pain or severe pain. 05/30/20  Yes Loni Dolly, PA-C  hydroxychloroquine (PLAQUENIL) 200 MG tablet Take 200 mg by mouth 2 (two) times daily. 10/26/19  Yes [provider]  LANTUS SOLOSTAR 100 UNIT/ML Solostar Pen Inject 10 Units into the skin every evening.  09/01/19  Yes [provider]  metoprolol tartrate (LOPRESSOR) 25 MG tablet Take 0.5 tablets (12.5 mg total) by mouth 2 (two) times daily. 07/19/19  Yes Sande Rives E, PA-C  metroNIDAZOLE (FLAGYL) 500 MG tablet Take 1 tablet (500 mg total) by mouth 2 (two) times daily for 14 days. 06/21/20 07/05/20 Yes Armbruster, Carlota Raspberry, MD  pantoprazole (PROTONIX) 40 MG tablet Take 1 tablet (40 mg total) by mouth 2 (two) times daily. Take twice daily for one month then continue with once daily. 06/18/20 07/18/20 Yes Arrien, Jimmy Picket, MD  predniSONE (DELTASONE) 5 MG tablet Take 5 mg by mouth daily with breakfast.    Yes [provider]  senna-docusate (SENOKOT-S) 8.6-50 MG tablet Take 1 tablet by mouth daily as needed for mild constipation.   Yes [provider]  simvastatin (ZOCOR) 80 MG tablet Take 80 mg by mouth at bedtime.     Yes [provider]  spironolactone (ALDACTONE) 25 MG tablet Take 0.5 tablets (12.5 mg total) by mouth daily. 07/28/19  Yes Belva Crome, MD  methotrexate (RHEUMATREX) 2.5 MG tablet Caution:Chemotherapy. Protect from light. Please resume your regular dose as before admission to  the hospital. 06/18/20   Arrien, Jimmy Picket, MD  ONE TOUCH ULTRA TEST test strip  06/24/13   [provider]  tiZANidine (ZANAFLEX) 2 MG tablet Take 1-2 tablets (2-4 mg total) by mouth every 6 (six) hours as needed for muscle spasms. Patient not taking: Reported on 07/01/2020 05/30/20 05/30/21  Loni Dolly, PA-C    Physical Exam: Vitals:   07/02/20 0245 07/02/20 0300 07/02/20 0330 07/02/20 0400  BP:  (!) 115/48 (!) 107/49 (!) 127/54  Pulse: 66 60 65 65  Resp: 18 17 17 16   Temp:      TempSrc:      SpO2: 98% 98% 99% 98%  Weight:      Height:        Constitutional: Acute alert  and oriented x3, no associated distress.   Skin: no rashes, no lesions, poor skin turgor noted. Eyes: Pupils are equally reactive to light.  No evidence of scleral icterus or conjunctival pallor.  ENMT: Dry mucous membranes noted.  Posterior pharynx clear of any exudate or lesions.   Neck: normal, supple, no masses, no thyromegaly.  No evidence of jugular venous distension.   Respiratory: clear to auscultation bilaterally, no wheezing, no crackles. Normal respiratory effort. No accessory muscle use.  Cardiovascular: Regular rate and rhythm, no murmurs / rubs / gallops.  Extensive bilateral lower extremity pitting edema that tracks up to the knees.  2+ pedal pulses. No carotid bruits.  Chest:   Nontender without crepitus or deformity.   Back:   Nontender without crepitus or deformity. Abdomen: Abdomen is soft.  Notable lower abdominal tenderness.  No evidence of intra-abdominal masses.  Positive bowel sounds noted in all quadrants.   Musculoskeletal: Bilateral lower extremity pitting edema as detailed above, right greater than left.  No joint deformity upper and lower extremities. Good ROM, no contractures. Normal muscle tone.  Neurologic: CN 2-12 grossly intact. Sensation intact.  Patient moving all 4 extremities spontaneously.  Patient is following all commands.  Patient is responsive to verbal stimuli.    Psychiatric: Patient exhibits normal mood with appropriate affect.  Patient seems to possess insight as to their current situation.     Labs on Admission: I have personally reviewed following labs and imaging studies -   CBC: Recent Labs  Lab 07/01/20 2220  WBC 10.1  NEUTROABS 8.6*  HGB 10.2*  HCT 31.8*  MCV 102.6*  PLT 518*   Basic Metabolic Panel: Recent Labs  Lab 07/01/20 2220  NA 139  K 3.4*  CL 105  CO2 25  GLUCOSE 206*  BUN 34*  CREATININE 1.36*  CALCIUM 8.6*   GFR: Estimated Creatinine Clearance: 27.4 mL/min (A) (by C-G formula based on SCr of 1.36 mg/dL (H)). Liver Function Tests: No results for input(s): AST, ALT, ALKPHOS, BILITOT, PROT, ALBUMIN in the last 168 hours. No results for input(s): LIPASE, AMYLASE in the last 168 hours. No results for input(s): AMMONIA in the last 168 hours. Coagulation Profile: No results for input(s): INR, PROTIME in the last 168 hours. Cardiac Enzymes: No results for input(s): CKTOTAL, CKMB, CKMBINDEX, TROPONINI in the last 168 hours. BNP (last 3 results) Recent Labs    07/19/19 1607 08/31/19 1052  PROBNP 1,614* 980*   HbA1C: No results for input(s): HGBA1C in the last 72 hours. CBG: No results for input(s): GLUCAP in the last 168 hours. Lipid Profile: No results for input(s): CHOL, HDL, LDLCALC, TRIG, CHOLHDL, LDLDIRECT in the last 72 hours. Thyroid Function Tests: No results for input(s): TSH, T4TOTAL, FREET4, T3FREE, THYROIDAB in the last 72 hours. Anemia Panel: No results for input(s): VITAMINB12, FOLATE, FERRITIN, TIBC, IRON, RETICCTPCT in the last 72 hours. Urine analysis:    Component Value Date/Time   COLORURINE YELLOW 07/02/2020 0249   APPEARANCEUR CLEAR 07/02/2020 0249   LABSPEC 1.010 07/02/2020 0249   PHURINE 6.0 07/02/2020 0249   GLUCOSEU NEGATIVE 07/02/2020 0249   HGBUR SMALL (A) 07/02/2020 0249   BILIRUBINUR NEGATIVE 07/02/2020 0249   KETONESUR NEGATIVE 07/02/2020 0249   PROTEINUR NEGATIVE  07/02/2020 0249   UROBILINOGEN 0.2 12/29/2014 1103   NITRITE NEGATIVE 07/02/2020 0249   LEUKOCYTESUR TRACE (A) 07/02/2020 0249    Radiological Exams on Admission - Personally Reviewed: DG Chest 1 View  Result Date: 07/02/2020 CLINICAL DATA:  Fall EXAM: CHEST  1 VIEW COMPARISON:  June 14, 2020 FINDINGS: The heart size and mediastinal contours unchanged with mild cardiomegaly. Aortic knob and mitral valve calcifications are seen. Overlying median sternotomy wires are present. Both lungs are clear. The visualized skeletal structures are unremarkable. IMPRESSION: No active disease. Electronically Signed   By: Prudencio Pair M.D.   On: 07/02/2020 00:17   DG Pelvis 1-2 Views  Result Date: 07/02/2020 CLINICAL DATA:  Fall EXAM: PELVIS - 1-2 VIEW COMPARISON:  None. FINDINGS: The patient is status post bilateral total hip arthroplasty. No periprosthetic lucency or fracture is identified. There are dense vascular calcifications. Diffuse osteopenia seen. Fixation hardware in the lower lumbar spine. IMPRESSION: Status post bilateral total hip arthroplasty without definite acute complication. Electronically Signed   By: Prudencio Pair M.D.   On: 07/02/2020 00:19   CT Lumbar Spine Wo Contrast  Result Date: 07/01/2020 CLINICAL DATA:  Lower back pain, trauma EXAM: CT LUMBAR SPINE WITHOUT CONTRAST TECHNIQUE: Multidetector CT imaging of the lumbar spine was performed without intravenous contrast administration. Multiplanar CT image reconstructions were also generated. COMPARISON:  None. FINDINGS: Segmentation: There are 5 non-rib bearing lumbar type vertebral bodies with the last intervertebral disc space labeled as L5-S1. Alignment: There is a minimal leftward curvature of the lumbar spine. Vertebrae: There is an age indeterminate, however likely subacute compression fracture at the inferior endplate of L1 with less than 25% loss in height. There is mildly displaced fracture seen through the anteroinferior  right-sided endplate. There is buckling of the posteroinferior endplate causing mild canal narrowing. There is diffuse osteopenia. Interbody fixation device is seen at L4-L5. Paraspinal and other soft tissues: The paraspinal soft tissues and visualized retroperitoneal structures are unremarkable. The sacroiliac joints are intact. Scattered dense vascular calcifications are noted. Low-density lesion seen partially exophytic the upper pole scattered colonic diverticula noted Disc levels: Multilevel lumbar spine spondylosis is seen most notable at L5-S1 with facet arthropathy causing severe right and moderate left neural foraminal narrowing IMPRESSION: Age indeterminate, however likely subacute inferior compression fracture of the L1 vertebral body with less than 25% height. There is buckling of the posteroinferior cortex causing mild canal narrowing Electronically Signed   By: Prudencio Pair M.D.   On: 07/01/2020 22:59   DG FEMUR, MIN 2 VIEWS RIGHT  Result Date: 07/02/2020 CLINICAL DATA:  Fall EXAM: RIGHT FEMUR 2 VIEWS COMPARISON:  None. FINDINGS: The patient is status post right total hip arthroplasty. No periprosthetic lucency or fracture is seen. Overlying subcutaneous edema noted. Dense vascular calcifications are seen. IMPRESSION: Status post right total hip arthroplasty without acute complication. Electronically Signed   By: Prudencio Pair M.D.   On: 07/02/2020 00:20    EKG: Personally reviewed.  Rhythm is normal sinus rhythm with heart rate of 60 bpm.  Evidence of prolonged QTC of 534 with left bundle branch block.  No dynamic ST segment changes appreciated.  Assessment/Plan Principal Problem:   Generalized weakness   Patient presenting with vague 2-week history of progressively worsening nonfocal weakness since discharge from [facility] on 10/10  Patient additionally complaining of episodes of presyncope, found to be somewhat hypotensive on arrival systolic blood pressures in the  90s.  Clinically, patient appears dry with poor skin turgor and dry mucous membranes despite peripheral edema.  Chemistry reveals elevated BUN of 34 and an elevated creatinine of 1.36 additionally suggestive of hypokalemia  Patient likely suffering from volume depletion despite peripheral edema.  Alternative possibilities include urinary tract infection, polypharmacy or deconditioning.  Patient does  complain of dark stools however this is likely secondary to iron supplementation -hemoglobin and hematocrit are actually higher on this presentation then date of discharge on 10/10  Obtaining troponin, BNP, urinalysis.  Chest x-ray reveals no evidence of pneumonia.  Hydrating patient gently with intravenous isotonic fluids  Obtaining physical therapy evaluation  Active Problems:   Bilateral leg edema   Etiology unclear  While patient exhibits bilateral leg edema, patient exhibits poor skin turgor and dry mucous membranes with hypotension on presentation with elevated BUN and creatinine making me feel that this is unlikely to be secondary to acute congestive heart failure.  Obtaining urinalysis to identify any significant proteinuria.  Obtaining hepatic function panel to evaluate albumin.  Obtaining BNP and based on this result will consider echocardiogram.  Obtaining bilateral lower extremity ultrasound to evaluate for any evidence of ileocaval thrombosis.   Mixed hyperlipidemia   Continue home regimen of statin therapy    Peripheral vascular disease ([place])   Patient has known history of significant peripheral vascular disease with critical ischemia of the right lower extremity identified during most recent hospitalization from 10/6-10/10.  Patient was evaluated by vascular surgery at that time but was recommended the patient remain on medical management with outpatient follow-up.  No significant cyanosis or pain of the right lower extremity, continue outpatient follow-up.     Rheumatoid arthritis ([place])   Continue home regimen of Plaquenil    Chronic diastolic CHF (congestive heart failure) (HCC)   Clinically, I feel patient is likely volume depleted despite the bilateral lower extremity pitting edema  Temporarily holding diuretics of Lasix and spironolactone  Obtaining BNP    Macrocytic anemia   Hemoglobin and hematocrit are actually higher than what they were on date of discharge on 10/10  Performing repeat hemoglobin and hematocrit in the morning to ensure that this is stable  Monitoring for any evidence of bleeding    Duodenal ulcer   Recent diagnosis of duodenal ulcers and Helicobacter pylori infection  Continuing amoxicillin, clarithromycin and metronidazole -patient is to remain on this regimen until 10/27.    Type 2 diabetes mellitus with diabetic polyneuropathy, with long-term current use of insulin (HCC)   Accu checks before every meal and nightly with sliding scale insulin  Continue home regimen of Lantus    Coronary artery disease involving native coronary artery of native heart without angina pectoris   No clinical evidence of chest discomfort  Monitoring patient on telemetry  Continuing home regimen of beta-blocker, statin therapy    Essential hypertension    Holding home regimen of antihypertensives in the setting of hypotension and concern for volume depletion  Code Status:  Full code Family Communication: deferred   Status is: Observation  The patient remains OBS appropriate and will d/c before 2 midnights.  Dispo: The patient is from: Home              Anticipated d/c is to: Home              Anticipated d/c date is: 2 days              Patient currently is not medically stable to d/c.        Vernelle Emerald MD [facility] Pager [phone]  If 7PM-7AM, please contact night-coverage [email or web address] Use universal [place] password for that web site. If you do not have the password,  please call the hospital operator.  07/02/2020, 4:13 AM

## 2020-07-02 NOTE — Care Management Obs Status (Signed)
[place] NOTIFICATION   Patient Details  Name: Patricia Villegas MRN: [id number] Date of Birth: 1934-12-26   Medicare Observation Status Notification Given:  Yes    Claudie Leach, RN 07/02/2020, 6:07 PM

## 2020-07-02 NOTE — ED Notes (Signed)
Patient transported to Vascular 

## 2020-07-02 NOTE — ED Notes (Signed)
PT at bedside.

## 2020-07-02 NOTE — Progress Notes (Signed)
  Echocardiogram 2D Echocardiogram has been performed.  Patricia Villegas 07/02/2020, 10:13 AM

## 2020-07-02 NOTE — ED Notes (Signed)
Breakfast ordered 

## 2020-07-02 NOTE — Progress Notes (Signed)
PROGRESS NOTE  Patricia Villegas  DOB: [birth year]  PCP: Haywood Pao, MD LPF:[id number]  DOA: 07/01/2020  LOS: 0 days   Chief Complaint  Patient presents with  . Fall  . GI Bleeding   Brief narrative: Patricia Villegas is a 84 y.o. female with PMH of DM2, HTN, [name], CAD(CABG 5329), PAD, diastolic CHF, rheumatoid arthritis, gout who underwent a hip surgery in September and was last hospitalized 10/6-10/10 for bleeding duodenal ulcers with H. pylori infection.  Patient was brought from home to the ED on 10/23 for fall to her knees. During her last hospitalization for GI bleeding, she had hemoglobin low at 6, received 2 units of PRBCs, patient underwent EGD, noted to have 2 duodenal ulcers  Pathology identified H. pylori infection for which she was placed on triple therapy with clarithromycin, metronidazole and amoxicillin. Since her discharge on 10/10, she was getting physical therapy at home and her weakness was gradually improving.  However for few days prior to presentation, patient started to get weak again.  Also reports regular blood bouts of black stools.  Patient is on iron supplement.  On 10/23, patient had an episode of extreme lightheadedness and fall without loss of consciousness.  EMS was called and she was subsequently brought to ED.  In the ED, patient was afebrile, heart rate in 70s, blood pressure in 100s.  She was extremely weak and looked dry on examination. Lab with WBC normal, hemoglobin 10.2, platelet 114, potassium 3.4, creatinine 1.36  Subjective: Patient was seen and examined this afternoon. Lying down in bed.  Not in distress.  Feels weak. chart reviewed. No fever, remains hemodynamically stable with soft blood pressure. Repeat blood work this morning with hemoglobin slightly lower at 9.6.  Assessment/Plan: AKI  -presented with progressive generalized weakness leading to lightheadedness and fall. -blood pressure soft, looked dry on examination although with  peripheral edema, BUN/creatinine elevated than baseline. -Continue LR at 75 mL/h. -Continue monitor creatinine. Recent Labs    07/19/19 1607 08/11/19 1347 08/31/19 1052 05/24/20 1201 06/08/20 1629 06/14/20 1351 06/15/20 0135 06/16/20 0158 06/17/20 0221 07/01/20 2220  BUN 18 24 14  30* 22 33* 29* 16 12 34*  CREATININE 0.88 1.25* 0.81 1.26* 1.29* 1.57* 1.15* 0.87 0.90 1.36*   Generalized weakness Lightheadedness, fall -PT evaluation ordered -continue to monitor in telemetry   Chronic diastolic CHF Essential hypertension Bilateral lower extremity edema -urinalysis without albuminuria -Obtain blood albumin level. -Echocardiogram with EF 60 to 92%, grade 1 diastolic dysfunction, mildly elevated right ventricular pressure. -Home meds include Lopressor 12.5 mg twice daily Lasix 40 mg daily, Benazepril 20 mg daily, -Continue Lopressor.  Keep others on hold.  CAD(CABG 2014), PAD, [name] - Patient has known history of significant PAD with critical ischemia of the right lower extremity identified during most recent hospitalization from 10/6-10/10. Patient was evaluated by vascular surgery at that time but was recommended the patient remain on medical management with outpatient follow-up. -No significant cyanosis or pain of the right lower extremity, continue outpatient follow-up. -Continue aspirin 81 mg daily, simvastatin 80 mg daily,  Rheumatoid arthritis -Continue home regimen of Methotrexate weekly, Plaquenil 200 mg twice daily, prednisone 5 mg daily, Zanaflex as needed, Norco as needed, Senokot.  Chronic macrocytic anemia -Hemoglobin stable. -Continue folic acid. Recent Labs    06/14/20 1351 06/15/20 0135 06/16/20 0158 06/16/20 1224 06/17/20 0221 06/17/20 0221 06/18/20 0200 07/01/20 2220 07/02/20 0415  HGB 7.7*   < >  --  10.0* 10.3*  --  9.3*  10.2* 9.6*  MCV 108.3*   < >   < >  --  101.5*   < >  --  102.6* 102.4*  VITAMINB12 471  --   --   --   --   --   --   --   --     FOLATE >100.0  --   --   --   --   --   --   --   --   FERRITIN 156  --   --   --   --   --   --   --   --   TIBC 300  --   --   --   --   --   --   --   --   IRON 62  --   --   --   --   --   --   --   --    < > = values in this interval not displayed.   Recent history of duodenal ulcer bleeding/H. pylori infection -Continuing amoxicillin, clarithromycin and metronidazole -patient is to remain on this regimen until 10/27. -Continue Protonix twice daily,  Type 2 diabetes mellitus  Diabetic polyneuropathy  -A1c 6.1 on 10/6 -Patient is continued on Lantus 10 units nightly at home. -Continue sliding scale insulin with Accu-Cheks. Recent Labs  Lab 07/02/20 0539 07/02/20 0739  GLUCAP 91 83   Gout -Continue Allopurinol   Mobility: PT eval Code Status:   Code Status: Full Code  Nutritional status: Body mass index is 25.42 kg/m.     Diet Order            Diet heart healthy/carb modified Room service appropriate? Yes; Fluid consistency: Thin  Diet effective now                 DVT prophylaxis: SCDs Start: 07/02/20 0413   Antimicrobials:  None Fluid: LR at 75 ml per hour Consultants: None Family Communication:  None at bedside  Infusions:  . lactated ringers 100 mL/hr (07/02/20 0431)    Scheduled Meds: . allopurinol  200 mg Oral Daily  . amoxicillin  1,000 mg Oral BID  . aspirin EC  81 mg Oral Daily  . atorvastatin  40 mg Oral Daily  . clarithromycin  500 mg Oral BID  . feeding supplement  237 mL Oral BID BM  . folic acid  2 mg Oral Daily  . hydroxychloroquine  200 mg Oral BID  . insulin aspart  0-15 Units Subcutaneous TID AC & HS  . insulin glargine  10 Units Subcutaneous QPM  . metoprolol tartrate  12.5 mg Oral BID  . metroNIDAZOLE  500 mg Oral BID  . pantoprazole  40 mg Oral BID  . predniSONE  5 mg Oral Q breakfast    Antimicrobials: Anti-infectives (From admission, onward)   Start     Dose/Rate Route Frequency Ordered Stop   07/02/20 1000   hydroxychloroquine (PLAQUENIL) tablet 200 mg        200 mg Oral 2 times daily 07/02/20 0412     07/02/20 1000  amoxicillin (AMOXIL) capsule 1,000 mg        1,000 mg Oral 2 times daily 07/02/20 0418 07/05/20 0959   07/02/20 1000  metroNIDAZOLE (FLAGYL) tablet 500 mg        500 mg Oral 2 times daily 07/02/20 0418 07/05/20 0959   07/02/20 1000  clarithromycin (BIAXIN) tablet 500 mg  500 mg Oral 2 times daily 07/02/20 0418 07/05/20 0959      PRN meds: acetaminophen **OR** acetaminophen, HYDROcodone-acetaminophen, ondansetron **OR** ondansetron (ZOFRAN) IV, polyethylene glycol   Objective: Vitals:   07/02/20 0700 07/02/20 0730  BP: (!) 117/49 (!) 100/55  Pulse: 63 63  Resp: 16 17  Temp:    SpO2: 98% 100%   No intake or output data in the 24 hours ending 07/02/20 0833 Filed Weights   07/01/20 2058  Weight: 63 kg   Weight change:  Body mass index is 25.42 kg/m.   Physical Exam: General exam: Appears calm and comfortable.  Not in physical distress Skin: No rashes, lesions or ulcers. HEENT: Atraumatic, normocephalic, supple neck, no obvious bleeding Lungs: Clear to auscultation bilaterally CVS: Regular rate and rhythm, no murmur GI/Abd soft, nontender, nondistended, bowel sound present CNS: Alert, awake, oriented x3 Psychiatry: Mood appropriate Extremities: Pedal edema trace bilaterally, right more than left.  No calf tenderness.  Dorsalis pedis weak, but both extremities feel warm.  Data Review: I have personally reviewed the laboratory data and studies available.  Recent Labs  Lab 07/01/20 2220 07/02/20 0415  WBC 10.1 9.2  NEUTROABS 8.6*  --   HGB 10.2* 9.6*  HCT 31.8* 29.8*  MCV 102.6* 102.4*  PLT 114* 121*   Recent Labs  Lab 07/01/20 2220  NA 139  K 3.4*  CL 105  CO2 25  GLUCOSE 206*  BUN 34*  CREATININE 1.36*  CALCIUM 8.6*    F/u labs ordered.  Signed, Terrilee Croak, MD [facility] 07/02/2020

## 2020-07-02 NOTE — Evaluation (Signed)
Physical Therapy Evaluation Patient Details Name: Patricia Villegas MRN: [id number] DOB: 09-Sep-1935 Today's Date: 07/02/2020   History of Present Illness  85 year old female with past medical history of coronary artery disease (CABG 2014), diabetes mellitus type 2, diabetic polyneuropathy, hyperlipidemia, hypertension, diastolic congestive heart failure (Echo 07/2019 EF 60-65%), rheumatoid arthritis, gout, peripheral vascular disease and recent diagnosis of bleeding duodenal ulcers with Helicobacter pylori, recent R THA  presenting to [facility] emergency department with complaints of severe weakness, lightheadedness and fall. Admitted for observation of B LE edema, and generalized weakness  Clinical Impression  This is the second hospitalization since pt R THA in September. Pt lives with husband in multistory home with bed and bath on main level and 4 steps to enter. Pt was able to ambulate household distances with RW and family provided iADLs. Pt is currently limited in safe mobility by generalized weakness and burning pain below bilateral knees. Pt requires modA for bed mobility and modA for transfers. Pt unable to attempt ambulation due to weakness and pain. PT recommends SNF level rehab to build strength and progress mobility to return home and safely navigate her home environment. PT will continue to follow acutely.      Follow Up Recommendations SNF    Equipment Recommendations  None recommended by PT    Recommendations for Other Services OT consult     Precautions / Restrictions Precautions Precautions: Fall Precaution Comments: Direct anterior approach R THA 10/8 Restrictions Weight Bearing Restrictions: No      Mobility  Bed Mobility Overal bed mobility: Needs Assistance Bed Mobility: Supine to Sit;Sit to Supine     Supine to sit: Mod assist Sit to supine: Mod assist   General bed mobility comments: assist to bring LE off bed and for bringing trunk to upright, assist for  bringing R LE back into bed and for squaring hips and trunk to bed     Transfers Overall transfer level: Needs assistance Equipment used: Rolling walker (2 wheeled);1 person hand held assist Transfers: Sit to/from [employer] Transfers Sit to Stand: Min assist Stand pivot transfers: Mod assist Squat pivot transfers: Mod assist     General transfer comment: modA for squat pivot from bed to [facility] with HHA, min A for power up and steadying for self pericare, modA for steadying at hips to pivot back to bed   Ambulation/Gait             General Gait Details: unable         Balance Overall balance assessment: Needs assistance Sitting-balance support: Feet supported Sitting balance-Leahy Scale: Fair     Standing balance support: Bilateral upper extremity supported;During functional activity Standing balance-Leahy Scale: Poor                               Pertinent Vitals/Pain Pain Assessment: 0-10 Pain Score: 6  Pain Location: bilateral LE below knees Pain Descriptors / Indicators: Burning Pain Intervention(s): Limited activity within patient's tolerance;Monitored during session;Repositioned    Home Living Family/patient expects to be discharged to:: Private residence Living Arrangements: Spouse/significant other Available Help at Discharge: Family;Available 24 hours/day Type of Home: House Home Access: Stairs to enter Entrance Stairs-Rails: Can reach both Entrance Stairs-Number of Steps: 4 Home Layout: Able to live on main level with bedroom/bathroom Home Equipment: Walker - 2 wheels;Cane - single point;Bedside commode;Shower seat Additional Comments: working with [facility] since R THA     Prior Function  Level of Independence: Needs assistance   Gait / Transfers Assistance Needed: ambulated household distances with RW  ADL's / Homemaking Assistance Needed: husband assisting with ADLs and family providing iADLs        Hand  Dominance   Dominant Hand: Right    Extremity/Trunk Assessment   Upper Extremity Assessment Upper Extremity Assessment: Generalized weakness    Lower Extremity Assessment Lower Extremity Assessment: RLE deficits/detail;LLE deficits/detail RLE Deficits / Details: AAROM WFL, R hip grossly 3/5, knee and ankle 3+/5 RLE Sensation:  (increased sensitivity to light touch ) RLE Coordination: decreased gross motor LLE Deficits / Details: AROM WFL, strength grossly 3+/5  LLE Sensation:  (increased sensitivity to touch ) LLE Coordination: decreased gross motor    Cervical / Trunk Assessment Cervical / Trunk Assessment: Kyphotic  Communication   Communication: No difficulties  Cognition Arousal/Alertness: Awake/alert Behavior During Therapy: WFL for tasks assessed/performed Overall Cognitive Status: Within Functional Limits for tasks assessed                                        General Comments General comments (skin integrity, edema, etc.): Pt with wound in sacral area, sensitive to sliding hips on bed, BP soft, SaO2 on RA >90%O2        Assessment/Plan    PT Assessment Patient needs continued PT services  PT Problem List Decreased strength;Decreased range of motion;Decreased activity tolerance;Decreased balance;Decreased mobility;Decreased coordination;Decreased knowledge of use of DME;Decreased safety awareness;Pain       PT Treatment Interventions DME instruction;Gait training;Stair training;Functional mobility training;Therapeutic activities;Therapeutic exercise;Balance training;Neuromuscular re-education;Patient/family education    PT Goals (Current goals can be found in the Care Plan section)  Acute Rehab PT Goals Patient Stated Goal: to go home PT Goal Formulation: With patient Time For Goal Achievement: 06/22/20 Potential to Achieve Goals: Good    Frequency Min 2X/week   Barriers to discharge Inaccessible home environment 4 steps to enter        AM-PAC PT "6 Clicks" Mobility  Outcome Measure Help needed turning from your back to your side while in a flat bed without using bedrails?: A Little Help needed moving from lying on your back to sitting on the side of a flat bed without using bedrails?: A Lot Help needed moving to and from a bed to a chair (including a wheelchair)?: A Lot Help needed standing up from a chair using your arms (e.g., wheelchair or bedside chair)?: A Little Help needed to walk in hospital room?: A Lot Help needed climbing 3-5 steps with a railing? : Total 6 Click Score: 13    End of Session Equipment Utilized During Treatment: Gait belt Activity Tolerance: Patient tolerated treatment well Patient left: in bed;with call bell/phone within reach;with bed alarm set Nurse Communication: Mobility status PT Visit Diagnosis: Unsteadiness on feet (R26.81);Other abnormalities of gait and mobility (R26.89);Muscle weakness (generalized) (M62.81);Pain Pain - Right/Left: Left Pain - part of body: Hip (LE below knee, and R hip )    Time: 2878-6767 PT Time Calculation (min) (ACUTE ONLY): 28 min   Charges:   PT Evaluation $PT Eval Moderate Complexity: 1 Mod PT Treatments $Therapeutic Activity: 8-22 mins        Clint Biello B. Migdalia Dk PT, DPT Acute Rehabilitation Services Pager [phone] Office [phone]   [place] 07/02/2020, 10:22 AM

## 2020-07-02 NOTE — Care Management (Signed)
Patient from home with husband.  She states she hopes to leave hospital tomorrow and would consider going to a SNF as recommended by PT.  She states she and her family are discussing but don't have information on facilities.  SNF list provided.  She has not previously been in a facility and has no preference currently.

## 2020-07-03 ENCOUNTER — Inpatient Hospital Stay (HOSPITAL_COMMUNITY): Payer: Medicare Other

## 2020-07-03 DIAGNOSIS — D539 Nutritional anemia, unspecified: Secondary | ICD-10-CM | POA: Diagnosis present

## 2020-07-03 DIAGNOSIS — I70221 Atherosclerosis of native arteries of extremities with rest pain, right leg: Secondary | ICD-10-CM | POA: Diagnosis present

## 2020-07-03 DIAGNOSIS — M7989 Other specified soft tissue disorders: Secondary | ICD-10-CM

## 2020-07-03 DIAGNOSIS — E1151 Type 2 diabetes mellitus with diabetic peripheral angiopathy without gangrene: Secondary | ICD-10-CM | POA: Diagnosis present

## 2020-07-03 DIAGNOSIS — Z951 Presence of aortocoronary bypass graft: Secondary | ICD-10-CM | POA: Diagnosis not present

## 2020-07-03 DIAGNOSIS — E782 Mixed hyperlipidemia: Secondary | ICD-10-CM | POA: Diagnosis present

## 2020-07-03 DIAGNOSIS — Z961 Presence of intraocular lens: Secondary | ICD-10-CM | POA: Diagnosis present

## 2020-07-03 DIAGNOSIS — W1839XA Other fall on same level, initial encounter: Secondary | ICD-10-CM | POA: Diagnosis present

## 2020-07-03 DIAGNOSIS — Z9842 Cataract extraction status, left eye: Secondary | ICD-10-CM | POA: Diagnosis not present

## 2020-07-03 DIAGNOSIS — K264 Chronic or unspecified duodenal ulcer with hemorrhage: Secondary | ICD-10-CM | POA: Diagnosis present

## 2020-07-03 DIAGNOSIS — N179 Acute kidney failure, unspecified: Secondary | ICD-10-CM | POA: Diagnosis present

## 2020-07-03 DIAGNOSIS — I82452 Acute embolism and thrombosis of left peroneal vein: Secondary | ICD-10-CM | POA: Diagnosis present

## 2020-07-03 DIAGNOSIS — R52 Pain, unspecified: Secondary | ICD-10-CM

## 2020-07-03 DIAGNOSIS — I5032 Chronic diastolic (congestive) heart failure: Secondary | ICD-10-CM | POA: Diagnosis present

## 2020-07-03 DIAGNOSIS — I088 Other rheumatic multiple valve diseases: Secondary | ICD-10-CM | POA: Diagnosis present

## 2020-07-03 DIAGNOSIS — I82462 Acute embolism and thrombosis of left calf muscular vein: Secondary | ICD-10-CM | POA: Diagnosis present

## 2020-07-03 DIAGNOSIS — Z9841 Cataract extraction status, right eye: Secondary | ICD-10-CM | POA: Diagnosis not present

## 2020-07-03 DIAGNOSIS — E1142 Type 2 diabetes mellitus with diabetic polyneuropathy: Secondary | ICD-10-CM | POA: Diagnosis present

## 2020-07-03 DIAGNOSIS — I82442 Acute embolism and thrombosis of left tibial vein: Secondary | ICD-10-CM | POA: Diagnosis present

## 2020-07-03 DIAGNOSIS — Z96643 Presence of artificial hip joint, bilateral: Secondary | ICD-10-CM | POA: Diagnosis present

## 2020-07-03 DIAGNOSIS — Y92002 Bathroom of unspecified non-institutional (private) residence single-family (private) house as the place of occurrence of the external cause: Secondary | ICD-10-CM | POA: Diagnosis not present

## 2020-07-03 DIAGNOSIS — Z8701 Personal history of pneumonia (recurrent): Secondary | ICD-10-CM | POA: Diagnosis not present

## 2020-07-03 DIAGNOSIS — M069 Rheumatoid arthritis, unspecified: Secondary | ICD-10-CM | POA: Diagnosis present

## 2020-07-03 DIAGNOSIS — R531 Weakness: Secondary | ICD-10-CM | POA: Diagnosis present

## 2020-07-03 DIAGNOSIS — I2510 Atherosclerotic heart disease of native coronary artery without angina pectoris: Secondary | ICD-10-CM | POA: Diagnosis present

## 2020-07-03 DIAGNOSIS — Z20822 Contact with and (suspected) exposure to covid-19: Secondary | ICD-10-CM | POA: Diagnosis present

## 2020-07-03 DIAGNOSIS — B9681 Helicobacter pylori [H. pylori] as the cause of diseases classified elsewhere: Secondary | ICD-10-CM | POA: Diagnosis present

## 2020-07-03 DIAGNOSIS — M109 Gout, unspecified: Secondary | ICD-10-CM | POA: Diagnosis present

## 2020-07-03 DIAGNOSIS — I110 Hypertensive heart disease with heart failure: Secondary | ICD-10-CM | POA: Diagnosis present

## 2020-07-03 LAB — COMPREHENSIVE METABOLIC PANEL
ALT: 39 U/L (ref 0–44)
AST: 58 U/L — ABNORMAL HIGH (ref 15–41)
Albumin: 2.4 g/dL — ABNORMAL LOW (ref 3.5–5.0)
Alkaline Phosphatase: 32 U/L — ABNORMAL LOW (ref 38–126)
Anion gap: 8 (ref 5–15)
BUN: 21 mg/dL (ref 8–23)
CO2: 25 mmol/L (ref 22–32)
Calcium: 8.3 mg/dL — ABNORMAL LOW (ref 8.9–10.3)
Chloride: 109 mmol/L (ref 98–111)
Creatinine, Ser: 1.05 mg/dL — ABNORMAL HIGH (ref 0.44–1.00)
GFR, Estimated: 53 mL/min — ABNORMAL LOW (ref >60)
Glucose, Bld: 77 mg/dL (ref 70–99)
Potassium: 2.9 mmol/L — ABNORMAL LOW (ref 3.5–5.1)
Sodium: 142 mmol/L (ref 135–145)
Total Bilirubin: 0.7 mg/dL (ref 0.3–1.2)
Total Protein: 4.3 g/dL — ABNORMAL LOW (ref 6.5–8.1)

## 2020-07-03 LAB — URINE CULTURE

## 2020-07-03 LAB — GLUCOSE, CAPILLARY
Glucose-Capillary: 77 mg/dL (ref 70–99)
Glucose-Capillary: 119 mg/dL — ABNORMAL HIGH (ref 70–99)
Glucose-Capillary: 92 mg/dL (ref 70–99)
Glucose-Capillary: 150 mg/dL — ABNORMAL HIGH (ref 70–99)

## 2020-07-03 LAB — CBC WITH DIFFERENTIAL/PLATELET
Abs Immature Granulocytes: 0.22 10*3/uL — ABNORMAL HIGH (ref 0.00–0.07)
Basophils Absolute: 0 10*3/uL (ref 0.0–0.1)
Basophils Relative: 0 %
Eosinophils Absolute: 0 10*3/uL (ref 0.0–0.5)
Eosinophils Relative: 0 %
HCT: 28.8 % — ABNORMAL LOW (ref 36.0–46.0)
Hemoglobin: 9.1 g/dL — ABNORMAL LOW (ref 12.0–15.0)
Immature Granulocytes: 2 %
Lymphocytes Relative: 9 %
Lymphs Abs: 0.8 10*3/uL (ref 0.7–4.0)
MCH: 33 pg (ref 26.0–34.0)
MCHC: 31.6 g/dL (ref 30.0–36.0)
MCV: 104.3 fL — ABNORMAL HIGH (ref 80.0–100.0)
Monocytes Absolute: 0.8 10*3/uL (ref 0.1–1.0)
Monocytes Relative: 8 %
Neutro Abs: 7.9 10*3/uL — ABNORMAL HIGH (ref 1.7–7.7)
Neutrophils Relative %: 81 %
Platelets: 140 10*3/uL — ABNORMAL LOW (ref 150–400)
RBC: 2.76 MIL/uL — ABNORMAL LOW (ref 3.87–5.11)
RDW: 18.8 % — ABNORMAL HIGH (ref 11.5–15.5)
WBC: 9.8 10*3/uL (ref 4.0–10.5)
nRBC: 0 % (ref 0.0–0.2)

## 2020-07-03 LAB — MAGNESIUM: Magnesium: 1.8 mg/dL (ref 1.7–2.4)

## 2020-07-03 MED ORDER — POLYETHYLENE GLYCOL 3350 17 G PO PACK
17.0000 g | PACK | Freq: Every day | ORAL | Status: DC | PRN
  Administered 2020-07-03 – 2020-07-04 (×2): 17 g via ORAL
  Filled 2020-07-03 (×2): qty 1

## 2020-07-03 NOTE — Progress Notes (Signed)
Occupational Therapy Evaluation Patient Details Name: Patricia Villegas MRN: [id number] DOB: Aug 14, 1935 Today's Date: 07/03/2020    History of Present Illness 85 year old female with past medical history of coronary artery disease (CABG 2014), diabetes mellitus type 2, diabetic polyneuropathy, hyperlipidemia, hypertension, diastolic congestive heart failure (Echo 07/2019 EF 60-65%), rheumatoid arthritis, gout, peripheral vascular disease and recent diagnosis of bleeding duodenal ulcers with Helicobacter pylori, recent R THA  presenting to [facility] emergency department with complaints of severe weakness, lightheadedness and fall. Admitted for observation of B LE edema, and generalized weakness   Clinical Impression   PTA, pt living at home with husband after recent hip surgery and having assistance form family for ADL/IADL tasks. Pt states until recent fall, she was walking household distances and doing some of her self care. Pt states I was getting "weaker and weaker". Pt required Mod A with sit - stand and only able to take 1 side step before fatigued. Max A with LB ADL. Session limited by IV team. Will return to progress pt OOB with use of Stedy. Pt will greatly benefit from rehab at SNF to maximize functional level of independence. Pt's goal is to return home with her husband. Will follow acutely.     Follow Up Recommendations  SNF;Supervision/Assistance - 24 hour    Equipment Recommendations  None recommended by OT    Recommendations for Other Services       Precautions / Restrictions Precautions Precautions: Fall      Mobility Bed Mobility Overal bed mobility: Needs Assistance Bed Mobility: Supine to Sit;Sit to Supine     Supine to sit: Mod assist Sit to supine: Mod assist        Transfers Overall transfer level: Needs assistance   Transfers: Sit to/from Stand Sit to Stand: Mod assist;From elevated surface Stand pivot transfers: Max assist            Balance  Overall balance assessment: Needs assistance   Sitting balance-Leahy Scale: Poor Sitting balance - Comments: Leaning L; falling toward L     Standing balance-Leahy Scale: Zero                             ADL either performed or assessed with clinical judgement   ADL Overall ADL's : Needs assistance/impaired     Grooming: Minimal assistance;Sitting   Upper Body Bathing: Minimal assistance;Sitting   Lower Body Bathing: Maximal assistance;Sit to/from stand   Upper Body Dressing : Moderate assistance;Sitting   Lower Body Dressing: Maximal assistance;Sit to/from stand   Toilet Transfer: Maximal assistance;Stand-pivot   Toileting- Clothing Manipulation and Hygiene: Maximal assistance       Functional mobility during ADLs: Maximal assistance;Rolling walker;Cueing for safety;Cueing for sequencing (able to stand with mod A however Max A to take 1 side step)       Vision         Perception     Praxis      Pertinent Vitals/Pain Pain Assessment: Faces Faces Pain Scale: Hurts even more Pain Location: bilateral LE below knees (R great toe; heel; with movemetn of RLE) Pain Descriptors / Indicators: Discomfort;Grimacing;Guarding;Burning Pain Intervention(s): Limited activity within patient's tolerance;Repositioned     Hand Dominance Right   Extremity/Trunk Assessment Upper Extremity Assessment Upper Extremity Assessment: Generalized weakness   Lower Extremity Assessment Lower Extremity Assessment: Defer to PT evaluation RLE Deficits / Details: difficulty with dorsiflexion R foot;  positions with foot inverted in plantarflexion   Cervical /  Trunk Assessment Cervical / Trunk Assessment: Kyphotic (hx of back surgery)   Communication Communication Communication: No difficulties   Cognition Arousal/Alertness: Awake/alert Behavior During Therapy: Anxious Overall Cognitive Status: Within Functional Limits for tasks assessed                                      General Comments       Exercises Total Joint Exercises Ankle Circles/Pumps: Both;10 reps;Supine Heel Slides: Right;10 reps;Supine   Shoulder Instructions      Home Living Family/patient expects to be discharged to:: Skilled nursing facility                                        Prior Functioning/Environment Level of Independence: Needs assistance  Gait / Transfers Assistance Needed: ambulated household distances with RW ADL's / Homemaking Assistance Needed: husband assisting with ADLs and family providing iADLs            OT Problem List: Decreased strength;Decreased range of motion;Decreased activity tolerance;Impaired balance (sitting and/or standing);Decreased safety awareness;Decreased knowledge of use of DME or AE;Pain      OT Treatment/Interventions: Self-care/ADL training;Therapeutic exercise;Energy conservation;DME and/or AE instruction;Therapeutic activities;Patient/family education;Balance training    OT Goals(Current goals can be found in the care plan section) Acute Rehab OT Goals Patient Stated Goal: to go to rehab and get stronger OT Goal Formulation: With patient Time For Goal Achievement: 07/17/20 Potential to Achieve Goals: Good  OT Frequency: Min 2X/week   Barriers to D/C:            Co-evaluation              AM-PAC OT "6 Clicks" Daily Activity     Outcome Measure Help from another person eating meals?: None Help from another person taking care of personal grooming?: A Little Help from another person toileting, which includes using toliet, bedpan, or urinal?: A Lot Help from another person bathing (including washing, rinsing, drying)?: A Lot Help from another person to put on and taking off regular upper body clothing?: A Lot Help from another person to put on and taking off regular lower body clothing?: A Lot 6 Click Score: 15   End of Session Equipment Utilized During Treatment: Gait belt;Rolling  walker Nurse Communication: Mobility status;Other (comment) (wound on R buttock)  Activity Tolerance: Patient tolerated treatment well;Other (comment) (treatment limited by IV team) Patient left: in bed;with call bell/phone within reach;with bed alarm set  OT Visit Diagnosis: Unsteadiness on feet (R26.81);Other abnormalities of gait and mobility (R26.89);History of falling (Z91.81);Muscle weakness (generalized) (M62.81);Pain;Dizziness and giddiness (R42) Pain - Right/Left: Right Pain - part of body: Leg;Hip;Ankle and joints of foot                Time: 2263-3354 OT Time Calculation (min): 17 min Charges:  OT General Charges $OT Visit: 1 Visit OT Evaluation $OT Eval Moderate Complexity: [place], OT/L   Acute OT Clinical Specialist Acute Rehabilitation Services Pager [phone] Office [phone]   [facility] 07/03/2020, 12:55 PM

## 2020-07-03 NOTE — Progress Notes (Signed)
Bilateral lower extremity venous duplex and IVC/Iliac vein duplex completed. Refer to "CV Proc" under chart review to view preliminary results.  Preliminary results discussed with Remo Lipps, RN, who will relay preliminary results to attending.  07/03/2020 6:26 PM Kelby Aline., MHA, RVT, RDCS, RDMS

## 2020-07-03 NOTE — Progress Notes (Signed)
OT Treatment Note  Pt seen additional session to progress OOB to recliner. Required Max A with use of Stedy. Pt with increased complaints of low back pain. Educated nsg staffon  use Stedy +2/transfer technique to assist with mobility. Pt complained of dizziness initially BP 104/48 sitting; 111/52 after sitting several minutes. Will greatly benefit from rehab at [facility].Will continue to follow acutely.  Asked Chaplain to visit with pt.    07/03/20 1300  OT Visit Information  Last OT Received On 07/03/20  Assistance Needed +2  History of Present Illness 85 year old female with past medical history of coronary artery disease (CABG 2014), diabetes mellitus type 2, diabetic polyneuropathy, hyperlipidemia, hypertension, diastolic congestive heart failure (Echo 07/2019 EF 60-65%), rheumatoid arthritis, gout, peripheral vascular disease and recent diagnosis of bleeding duodenal ulcers with Helicobacter pylori, recent R THA  presenting to [facility] emergency department with complaints of severe weakness, lightheadedness and fall. Admitted for observation of B LE edema, and generalized weakness. Lumbar CT subacute inferior compression fracture of the L1 vertebral body   Precautions  Precautions Fall  Precaution Comments back precautions for pain control due to L1 subacute compression fx  Pain Assessment  Pain Assessment Faces  Faces Pain Scale 6  Pain Location lower back  Pain Descriptors / Indicators Discomfort;Grimacing;Guarding;Burning  Pain Intervention(s) Limited activity within patient's tolerance;Repositioned  Cognition  Arousal/Alertness Awake/alert  Behavior During Therapy Anxious  Overall Cognitive Status Within Functional Limits for tasks assessed  General Comments most likely baseline memory impairment; decreased STM during session  Upper Extremity Assessment  Upper Extremity Assessment Generalized weakness  Lower Extremity Assessment  Lower Extremity Assessment Defer to PT evaluation   Bed Mobility  Overal bed mobility Needs Assistance  Bed Mobility Supine to Sit  Supine to sit Mod assist  Balance  Sitting balance-Leahy Scale Poor  Sitting balance - Comments L lean  Standing balance-Leahy Scale Zero  Transfers  Overall transfer level Needs assistance  Transfer via Lift Equipment Stedy  Transfers Sit to/from Stand  Sit to Stand Max assist  General Comments  General comments (skin integrity, edema, etc.) 108/47; HR 68; SpO2 95 RA; 111/52 after sitting  OT - End of Session  Equipment Utilized During Treatment Gait belt Charlaine Dalton)  Activity Tolerance Patient tolerated treatment well  Patient left in chair;with call bell/phone within reach;with chair alarm set  Nurse Communication Mobility status;Need for lift equipment  OT Assessment/Plan  OT Plan Discharge plan remains appropriate  OT Visit Diagnosis Unsteadiness on feet (R26.81);Other abnormalities of gait and mobility (R26.89);History of falling (Z91.81);Muscle weakness (generalized) (M62.81);Pain;Dizziness and giddiness (R42)  Pain - Right/Left Right  Pain - part of body Leg;Hip;Ankle and joints of foot (back)  OT Frequency (ACUTE ONLY) Min 2X/week  Follow Up Recommendations SNF;Supervision/Assistance - 24 hour  OT Equipment None recommended by OT  AM-PAC OT "6 Clicks" Daily Activity Outcome Measure (Version 2)  Help from another person eating meals? 4  Help from another person taking care of personal grooming? 3  Help from another person toileting, which includes using toliet, bedpan, or urinal? 2  Help from another person bathing (including washing, rinsing, drying)? 2  Help from another person to put on and taking off regular upper body clothing? 2  Help from another person to put on and taking off regular lower body clothing? 2  6 Click Score 15  OT Goal Progression  Progress towards OT goals Progressing toward goals  Acute Rehab OT Goals  Patient Stated Goal to go to rehab and get  stronger  OT Goal  Formulation With patient  Time For Goal Achievement 07/17/20  Potential to Achieve Goals Good  ADL Goals  Pt Will Perform Grooming with modified independence;standing  Pt Will Perform Upper Body Bathing with set-up;sitting  Pt Will Perform Lower Body Bathing with min assist;sit to/from stand  Pt Will Perform Lower Body Dressing with min assist;sit to/from stand;with adaptive equipment  Pt Will Transfer to Toilet with min assist;ambulating;bedside commode  Pt Will Perform Toileting - Clothing Manipulation and hygiene sit to/from stand;with min assist  Pt Will Perform Tub/Shower Transfer with modified independence;ambulating  OT Time Calculation  OT Start Time (ACUTE ONLY) 1304  OT Stop Time (ACUTE ONLY) 1331  OT Time Calculation (min) 27 min  OT General Charges  $OT Visit 1 Visit  OT Treatments  $Self Care/Home Management  23-37 mins  Maurie Boettcher, OT/L   Acute OT Clinical Specialist [place] Pager [phone] Office [phone]

## 2020-07-03 NOTE — Progress Notes (Addendum)
PROGRESS NOTE  Patricia Villegas  DOB: Jun 30, 1935  PCP: Haywood Pao, MD PJA:[id number]  DOA: 07/01/2020  LOS: 0 days   Chief Complaint  Patient presents with   Fall   GI Bleeding   Brief narrative: Patricia Villegas is a 85 y.o. female with PMH of DM2, HTN, [name], CAD(CABG 3419), PAD, diastolic CHF, rheumatoid arthritis, gout who underwent a hip surgery in September and was last hospitalized 10/6-10/10 for bleeding duodenal ulcers with H. pylori infection.  Patient was brought from home to the ED on 10/23 for fall to her knees.  During her last hospitalization for GI bleeding, she had hemoglobin low at 6, received 2 units of PRBCs, patient underwent EGD, noted to have 2 duodenal ulcers  Pathology identified H. pylori infection for which she was placed on triple therapy with clarithromycin, metronidazole and amoxicillin. Since her discharge on 10/10, she was getting physical therapy at home and her weakness was gradually improving.  However for few days prior to presentation, patient started to get weak again.  Also reports regular blood bouts of black stools.  Patient is on iron supplement.   On 10/23, patient had an episode of extreme lightheadedness and fall without loss of consciousness.  EMS was called and she was subsequently brought to ED.  In the ED, patient was afebrile, heart rate in 70s, blood pressure in 100s.  She was extremely weak and looked dry on examination. Lab with WBC normal, hemoglobin 10.2, platelet 114, potassium 3.4, creatinine 1.36 Patient was admitted to hospital service for further evaluation management.  Subjective: Patient was seen and examined this morning. Lying down in bed.  Not in distress.  Continues to feel weak.  Husband at bedside. Vital signs stable.  Blood work this morning with potassium low at 2.3  Assessment/Plan: AKI  -presented with progressive generalized weakness leading to lightheadedness and fall. -On admission, patient looked dry,  blood pressure was soft.  She was started on LR at 75 mL/h. -Creatinine gradually improving.  Okay to stop IV fluid today. Recent Labs    08/11/19 1347 08/31/19 1052 05/24/20 1201 06/08/20 1629 06/14/20 1351 06/15/20 0135 06/16/20 0158 06/17/20 0221 07/01/20 2220 07/03/20 0332  BUN 24 14 30* 22 33* 29* 16 12 34* 21  CREATININE 1.25* 0.81 1.26* 1.29* 1.57* 1.15* 0.87 0.90 1.36* 1.05*   Generalized weakness Lightheadedness, fall -PT evaluation obtained.  SNF recommended.  Patient and husband agree.  Chronic diastolic CHF Essential hypertension Bilateral lower extremity edema -urinalysis without albuminuria -Echocardiogram with EF 60 to 37%, grade 1 diastolic dysfunction, mildly elevated right ventricular pressure. -Home meds include Lopressor 12.5 mg twice daily, Lasix 40 mg daily, Benazepril 20 mg daily, -Continue Lopressor.  Currently Lasix and benazepril are on hold.  CAD(CABG 2014), PAD, [name] - Patient has known history of significant PAD with critical ischemia of the right lower extremity identified during most recent hospitalization from 10/6-10/10. Patient was evaluated by vascular surgery at that time but was recommended that the patient remain on medical management with outpatient follow-up. -No significant cyanosis or pain of the right lower extremity, continue outpatient follow-up. -Continue aspirin 81 mg daily, simvastatin 80 mg daily,  Rheumatoid arthritis -Continue home regimen of Methotrexate weekly, Plaquenil 200 mg twice daily, prednisone 5 mg daily, Zanaflex as needed, Norco as needed, Senokot.  Chronic macrocytic anemia -Hemoglobin stable. -Continue folic acid. Recent Labs    06/14/20 1351 06/15/20 0135 06/17/20 0221 06/17/20 0221 06/18/20 0200 07/01/20 2220 07/01/20 2220 07/02/20 9024 07/03/20 0973  HGB 7.7*   < > 10.3*  --  9.3* 10.2*  --  9.6* 9.1*  MCV 108.3*   < > 101.5*   < >  --  102.6*   < > 102.4* 104.3*  VITAMINB12 471  --   --   --    --   --   --   --   --   FOLATE >100.0  --   --   --   --   --   --   --   --   FERRITIN 156  --   --   --   --   --   --   --   --   TIBC 300  --   --   --   --   --   --   --   --   IRON 62  --   --   --   --   --   --   --   --    < > = values in this interval not displayed.   Recent history of duodenal ulcer bleeding/H. pylori infection -Continuing amoxicillin, clarithromycin and metronidazole -patient is to remain on this regimen until 10/27. -Continue Protonix twice daily,  Type 2 diabetes mellitus  Diabetic polyneuropathy  -A1c 6.1 on 10/6 -Continue Lantus 10 units nightly, continue sliding scale insulin with Accu-Cheks. Recent Labs  Lab 07/02/20 1153 07/02/20 1639 07/02/20 2142 07/03/20 0748 07/03/20 1218  GLUCAP 99 161* 155* 77 119*   Gout -Continue Allopurinol   Mobility: PT eval Code Status:   Code Status: Full Code  Nutritional status: Body mass index is 22.95 kg/m.     Diet Order            Diet heart healthy/carb modified Room service appropriate? Yes; Fluid consistency: Thin  Diet effective now                 DVT prophylaxis: SCDs Start: 07/02/20 0413   Antimicrobials:  None Fluid: IV fluid is stopped. Consultants: None Family Communication:  None at bedside  Status is: Observation  The patient will require care spanning > 2 midnights and should be moved to inpatient because: -Patient remains weak.  Needs to go to SNF.  Dispo: The patient is from: Home              Anticipated d/c is to: SNF              Anticipated d/c date is: 1 day              Patient currently is not medically stable to d/c.   Infusions:    Scheduled Meds:  allopurinol  200 mg Oral Daily   amoxicillin  1,000 mg Oral BID   aspirin EC  81 mg Oral Daily   atorvastatin  40 mg Oral Daily   clarithromycin  500 mg Oral BID   feeding supplement  237 mL Oral BID BM   folic acid  2 mg Oral Daily   hydroxychloroquine  200 mg Oral BID   insulin aspart  0-15  Units Subcutaneous TID AC & HS   insulin glargine  10 Units Subcutaneous QPM   metoprolol tartrate  12.5 mg Oral BID   metroNIDAZOLE  500 mg Oral BID   pantoprazole  40 mg Oral BID   predniSONE  5 mg Oral Q breakfast    Antimicrobials: Anti-infectives (From admission, onward)   Start     Dose/Rate  Route Frequency Ordered Stop   07/02/20 1000  hydroxychloroquine (PLAQUENIL) tablet 200 mg        200 mg Oral 2 times daily 07/02/20 0412     07/02/20 1000  amoxicillin (AMOXIL) capsule 1,000 mg        1,000 mg Oral 2 times daily 07/02/20 0418 07/05/20 0959   07/02/20 1000  metroNIDAZOLE (FLAGYL) tablet 500 mg        500 mg Oral 2 times daily 07/02/20 0418 07/05/20 0959   07/02/20 1000  clarithromycin (BIAXIN) tablet 500 mg        500 mg Oral 2 times daily 07/02/20 0418 07/05/20 0959      PRN meds: acetaminophen **OR** acetaminophen, HYDROcodone-acetaminophen, ondansetron **OR** ondansetron (ZOFRAN) IV, polyethylene glycol   Objective: Vitals:   07/03/20 0045 07/03/20 0524  BP: (!) 93/51 (!) 123/53  Pulse: 67 71  Resp: 20 20  Temp: 99.3 F (37.4 C) 98.2 F (36.8 C)  SpO2: 98% 95%    Intake/Output Summary (Last 24 hours) at 07/03/2020 1410 Last data filed at 07/03/2020 0930 Gross per 24 hour  Intake 875.33 ml  Output 1500 ml  Net -624.67 ml   Filed Weights   07/01/20 2058 07/02/20 1155  Weight: 63 kg 64.5 kg   Weight change: 1.45 kg Body mass index is 22.95 kg/m.   Physical Exam: General exam: Appears calm and comfortable.  Not in physical distress Skin: No rashes, lesions or ulcers. HEENT: Atraumatic, normocephalic, supple neck, no obvious bleeding Lungs: Clear to auscultation bilaterally CVS: Regular rate and rhythm, no murmur GI/Abd soft, nontender, nondistended, bowel sound present CNS: Alert, awake, oriented x3 Psychiatry: Mood appropriate Extremities: Pedal edema trace bilaterally, right more than left.  No calf tenderness.  Dorsalis pedis weak, but  both extremities feel warm.  Data Review: I have personally reviewed the laboratory data and studies available.  Recent Labs  Lab 07/01/20 2220 07/02/20 0415 07/03/20 0332  WBC 10.1 9.2 9.8  NEUTROABS 8.6*  --  7.9*  HGB 10.2* 9.6* 9.1*  HCT 31.8* 29.8* 28.8*  MCV 102.6* 102.4* 104.3*  PLT 114* 121* 140*   Recent Labs  Lab 07/01/20 2220 07/02/20 1314 07/03/20 0332  NA 139  --  142  K 3.4*  --  2.9*  CL 105  --  109  CO2 25  --  25  GLUCOSE 206*  --  77  BUN 34*  --  21  CREATININE 1.36*  --  1.05*  CALCIUM 8.6*  --  8.3*  MG  --  1.9 1.8    F/u labs ordered.  Signed, Terrilee Croak, MD [facility] 07/03/2020

## 2020-07-04 LAB — BASIC METABOLIC PANEL
Anion gap: 8 (ref 5–15)
BUN: 13 mg/dL (ref 8–23)
CO2: 27 mmol/L (ref 22–32)
Calcium: 8.6 mg/dL — ABNORMAL LOW (ref 8.9–10.3)
Chloride: 109 mmol/L (ref 98–111)
Creatinine, Ser: 0.89 mg/dL (ref 0.44–1.00)
GFR, Estimated: >60 mL/min (ref >60)
Glucose, Bld: 94 mg/dL (ref 70–99)
Potassium: 3.3 mmol/L — ABNORMAL LOW (ref 3.5–5.1)
Sodium: 144 mmol/L (ref 135–145)

## 2020-07-04 LAB — GLUCOSE, CAPILLARY
Glucose-Capillary: 58 mg/dL — ABNORMAL LOW (ref 70–99)
Glucose-Capillary: 77 mg/dL (ref 70–99)
Glucose-Capillary: 224 mg/dL — ABNORMAL HIGH (ref 70–99)
Glucose-Capillary: 136 mg/dL — ABNORMAL HIGH (ref 70–99)
Glucose-Capillary: 110 mg/dL — ABNORMAL HIGH (ref 70–99)

## 2020-07-04 LAB — CBC WITH DIFFERENTIAL/PLATELET
Abs Immature Granulocytes: 0.13 10*3/uL — ABNORMAL HIGH (ref 0.00–0.07)
Basophils Absolute: 0 10*3/uL (ref 0.0–0.1)
Basophils Relative: 0 %
Eosinophils Absolute: 0 10*3/uL (ref 0.0–0.5)
Eosinophils Relative: 0 %
HCT: 30.9 % — ABNORMAL LOW (ref 36.0–46.0)
Hemoglobin: 9.9 g/dL — ABNORMAL LOW (ref 12.0–15.0)
Immature Granulocytes: 2 %
Lymphocytes Relative: 9 %
Lymphs Abs: 0.8 10*3/uL (ref 0.7–4.0)
MCH: 33 pg (ref 26.0–34.0)
MCHC: 32 g/dL (ref 30.0–36.0)
MCV: 103 fL — ABNORMAL HIGH (ref 80.0–100.0)
Monocytes Absolute: 0.8 10*3/uL (ref 0.1–1.0)
Monocytes Relative: 9 %
Neutro Abs: 7.2 10*3/uL (ref 1.7–7.7)
Neutrophils Relative %: 80 %
Platelets: 147 10*3/uL — ABNORMAL LOW (ref 150–400)
RBC: 3 MIL/uL — ABNORMAL LOW (ref 3.87–5.11)
RDW: 18.7 % — ABNORMAL HIGH (ref 11.5–15.5)
WBC: 9 10*3/uL (ref 4.0–10.5)
nRBC: 0 % (ref 0.0–0.2)

## 2020-07-04 LAB — MAGNESIUM: Magnesium: 1.9 mg/dL (ref 1.7–2.4)

## 2020-07-04 LAB — PHOSPHORUS: Phosphorus: 2.9 mg/dL (ref 2.5–4.6)

## 2020-07-04 MED ORDER — APIXABAN 5 MG PO TABS
10.0000 mg | ORAL_TABLET | Freq: Two times a day (BID) | ORAL | Status: DC
  Administered 2020-07-04 – 2020-07-06 (×5): 10 mg via ORAL
  Filled 2020-07-04 (×5): qty 2

## 2020-07-04 MED ORDER — APIXABAN 5 MG PO TABS: 5.0000 mg | ORAL_TABLET | Freq: Two times a day (BID) | ORAL | Status: DC

## 2020-07-04 MED ORDER — INSULIN ASPART 100 UNIT/ML ~~LOC~~ SOLN
0.0000 [IU] | Freq: Three times a day (TID) | SUBCUTANEOUS | Status: DC
  Administered 2020-07-05: 3 [IU] via SUBCUTANEOUS

## 2020-07-04 MED ORDER — INSULIN GLARGINE 100 UNIT/ML ~~LOC~~ SOLN
5.0000 [IU] | Freq: Every evening | SUBCUTANEOUS | Status: DC
  Administered 2020-07-04 – 2020-07-05 (×2): 5 [IU] via SUBCUTANEOUS
  Filled 2020-07-04 (×3): qty 0.05

## 2020-07-04 MED ORDER — INSULIN ASPART 100 UNIT/ML ~~LOC~~ SOLN: 0.0000 [IU] | Freq: Every day | SUBCUTANEOUS | Status: DC

## 2020-07-04 NOTE — Progress Notes (Signed)
CRITICAL VALUE ALERT  Critical Value:  Positive DVT to her leg  Date & Time Notied: 07/03/20 @ 1800  Provider Notified: Dr. Pietro Cassis  Orders Received/Actions taken: MD aware, awaiting orders

## 2020-07-04 NOTE — Discharge Instructions (Signed)
Information on my medicine - ELIQUIS (apixaban)  This medication education was reviewed with me or my healthcare representative as part of my discharge preparation.   Why was Eliquis prescribed for you? Eliquis was prescribed to treat blood clots that may have been found in the veins of your legs (deep vein thrombosis) or in your lungs (pulmonary embolism) and to reduce the risk of them occurring again.  What do You need to know about Eliquis ? The starting dose is 10 mg (two 5 mg tablets) taken TWICE daily for the FIRST SEVEN (7) DAYS, then on 08/08/2020  the dose is reduced to ONE 5 mg tablet taken TWICE daily.  Eliquis may be taken with or without food.   Try to take the dose about the same time in the morning and in the evening. If you have difficulty swallowing the tablet whole please discuss with your pharmacist how to take the medication safely.  Take Eliquis exactly as prescribed and DO NOT stop taking Eliquis without talking to the doctor who prescribed the medication.  Stopping may increase your risk of developing a new blood clot.  Refill your prescription before you run out.  After discharge, you should have regular check-up appointments with your healthcare provider that is prescribing your Eliquis.    What do you do if you miss a dose? If a dose of ELIQUIS is not taken at the scheduled time, take it as soon as possible on the same day and twice-daily administration should be resumed. The dose should not be doubled to make up for a missed dose.  Important Safety Information A possible side effect of Eliquis is bleeding. You should call your healthcare provider right away if you experience any of the following: ? Bleeding from an injury or your nose that does not stop. ? Unusual colored urine (red or dark brown) or unusual colored stools (red or black). ? Unusual bruising for unknown reasons. ? A serious fall or if you hit your head (even if there is no bleeding).  Some  medicines may interact with Eliquis and might increase your risk of bleeding or clotting while on Eliquis. To help avoid this, consult your healthcare provider or pharmacist prior to using any new prescription or non-prescription medications, including herbals, vitamins, non-steroidal anti-inflammatory drugs (NSAIDs) and supplements.  This website has more information on Eliquis (apixaban): http://[email or web address].com/eliquis/home

## 2020-07-04 NOTE — Progress Notes (Signed)
Inpatient Diabetes Program Recommendations  AACE/ADA: New Consensus Statement on Inpatient Glycemic Control (2015)  Target Ranges:  Prepandial:   less than 140 mg/dL      Peak postprandial:   less than 180 mg/dL (1-2 hours)      Critically ill patients:  140 - 180 mg/dL   Lab Results  Component Value Date   GLUCAP 77 07/04/2020   HGBA1C 6.1 (H) 06/14/2020    Review of Glycemic Control  Diabetes history:  DM2 Outpatient Diabetes medications: Lantus 10 units QHS Current orders for Inpatient glycemic control: Lantus 10 units QHS, Novolog 0-15 units tidwc and hs. On Prednisone 5 mg QD  HgbA1C - 6.1% - likely not accurate d/t low H/H 58, 77 mg/dL this am.    Inpatient Diabetes Program Recommendations:     Decrease Lantus to 5 units QHS Decrease Novolog to 0-9 units tidwc and hs  Secure text to MD.  Thank you. Lorenda Peck, RD, LDN, CDE Inpatient Diabetes Coordinator [phone]

## 2020-07-04 NOTE — Progress Notes (Signed)
PROGRESS NOTE  Patricia Villegas  DOB: [birth year]  PCP: Haywood Pao, MD DVV:[id number]  DOA: 07/01/2020  LOS: 1 day   Chief Complaint  Patient presents with  . Fall  . GI Bleeding   Brief narrative: Patricia Villegas is a 85 y.o. female with PMH of DM2, HTN, [name], CAD(CABG 6269), PAD, diastolic CHF, rheumatoid arthritis, gout who underwent a hip surgery in September and was last hospitalized 10/6-10/10 for bleeding duodenal ulcers with H. pylori infection.  Patient was brought from home to the ED on 10/23 for fall to her knees.  During her last hospitalization for GI bleeding, she had hemoglobin low at 6, received 2 units of PRBCs, patient underwent EGD, noted to have 2 duodenal ulcers  Pathology identified H. pylori infection for which she was placed on triple therapy with clarithromycin, metronidazole and amoxicillin. Since her discharge on 10/10, she was getting physical therapy at home and her weakness was gradually improving.  However for few days prior to presentation, patient started to get weak again.  Also reports regular blood bouts of black stools.  Patient is on iron supplement.   On 10/23, patient had an episode of extreme lightheadedness and fall without loss of consciousness.  EMS was called and she was subsequently brought to ED.  In the ED, patient was afebrile, heart rate in 70s, blood pressure in 100s.  She was extremely weak and looked dry on examination. Lab with WBC normal, hemoglobin 10.2, platelet 114, potassium 3.4, creatinine 1.36 Patient was admitted to hospital service for further evaluation management.  Subjective: Patient was seen and examined this morning. Lying down in bed.  Not in distress. Family not at bedside. We discussed about the new diagnosis of DVT in the left lower extremity.  Patient agrees to start anticoagulation.  Assessment/Plan: AKI  -presented with progressive generalized weakness leading to lightheadedness and fall. -On admission,  patient looked dry, blood pressure was soft.  She was started on LR at 75 mL/h. -Creatinine gradually improved.  Off IV fluid now. Recent Labs    08/31/19 1052 05/24/20 1201 06/08/20 1629 06/14/20 1351 06/15/20 0135 06/16/20 0158 06/17/20 0221 07/01/20 2220 07/03/20 0332 07/04/20 0843  BUN 14 30* 22 33* 29* 16 12 34* 21 13  CREATININE 0.81 1.26* 1.29* 1.57* 1.15* 0.87 0.90 1.36* 1.05* 0.89   Acute left leg DVT -Patient has bilateral leg swelling right more than left.  Ultrasound duplex noted of the less swollen left leg showed an acute DVT involving the left posterior tibial veins, left peroneal veins, and left gastrocnemius veins -I had a discussion with patient this morning about the risk and benefit of anticoagulation.  She was recently hospitalized for duodenal ulcer and is currently on Protonix twice daily and triple therapy. -Anticoagulation increases her risk of bleeding but as long as he stays on Protonix twice daily the benefits of anticoagulation outweighs the risk.  Patient agrees to proceed.  Started on Eliquis 10 mg twice daily this morning.  Generalized weakness Lightheadedness, fall -PT evaluation was obtained.  SNF recommended.  Patient and husband wants to go to [facility].  Chronic diastolic CHF Essential hypertension Bilateral lower extremity edema -urinalysis without albuminuria -Echocardiogram with EF 60 to 48%, grade 1 diastolic dysfunction, mildly elevated right ventricular pressure. -Home meds include Lopressor 12.5 mg twice daily, Lasix 40 mg daily, Benazepril 20 mg daily, -Continue Lopressor.  Currently Lasix and benazepril are on hold. -Blood pressure in low normal range.  I would continue to hold them.  CAD(CABG 2014), PAD, [name] - Patient has known history of significant PAD with critical ischemia of the right lower extremity identified during most recent hospitalization from 10/6-10/10. Patient was evaluated by vascular surgery at that time but was  recommended that the patient remain on medical management with outpatient follow-up. -No significant cyanosis or pain of the right lower extremity, continue outpatient follow-up. -Continue aspirin 81 mg daily, simvastatin 80 mg daily,  Rheumatoid arthritis -Continue home regimen of Methotrexate weekly, Plaquenil 200 mg twice daily, prednisone 5 mg daily, Zanaflex as needed, Norco as needed, Senokot.  Chronic macrocytic anemia -Hemoglobin stable. -Continue folic acid. Recent Labs    06/14/20 1351 06/15/20 0135 06/17/20 0221 06/18/20 0200 07/01/20 2220 07/01/20 2220 07/02/20 0415 07/02/20 0415 07/03/20 0332 07/04/20 0843  HGB 7.7*   < >  --  9.3* 10.2*  --  9.6*  --  9.1* 9.9*  MCV 108.3*   < >   < >  --  102.6*   < > 102.4*   < > 104.3* 103.0*  VITAMINB12 471  --   --   --   --   --   --   --   --   --   FOLATE >100.0  --   --   --   --   --   --   --   --   --   FERRITIN 156  --   --   --   --   --   --   --   --   --   TIBC 300  --   --   --   --   --   --   --   --   --   IRON 62  --   --   --   --   --   --   --   --   --    < > = values in this interval not displayed.   Recent history of duodenal ulcer bleeding/H. pylori infection -Continuing amoxicillin, clarithromycin and metronidazole till today 10/27. -Continue Protonix twice daily,  Type 2 diabetes mellitus  Diabetic polyneuropathy  -A1c 6.1 on 10/6 -Blood sugar level was low at 58 this morning.  Diabetes care coordinator consult appreciated.   -Reduce Lantus dose to 5 units nightly.  Downgrade sliding scale insulin to very sensitive scale.  Recent Labs  Lab 07/03/20 1637 07/03/20 2113 07/04/20 0756 07/04/20 0845 07/04/20 1210  GLUCAP 92 150* 58* 77 224*   Gout -Continue Allopurinol   Mobility: PT eval Code Status:   Code Status: Full Code  Nutritional status: Body mass index is 22.95 kg/m.     Diet Order            Diet heart healthy/carb modified Room service appropriate? Yes with Assist;  Fluid consistency: Thin  Diet effective now                 DVT prophylaxis: SCDs Start: 07/02/20 0413   Antimicrobials:  None Fluid: IV fluid is stopped. Consultants: None Family Communication:  None at bedside  Status is: Inpatient  Remains inpatient appropriate because:Unsafe d/c plan   Dispo: The patient is from: Home              Anticipated d/c is to: Home              Anticipated d/c date is: Whenever SNF bed is available  Patient currently is medically stable to d/c.  Infusions:    Scheduled Meds: . allopurinol  200 mg Oral Daily  . amoxicillin  1,000 mg Oral BID  . apixaban  10 mg Oral BID   Followed by  . [START ON 07/31/2020] apixaban  5 mg Oral BID  . aspirin EC  81 mg Oral Daily  . atorvastatin  40 mg Oral Daily  . clarithromycin  500 mg Oral BID  . feeding supplement  237 mL Oral BID BM  . folic acid  2 mg Oral Daily  . hydroxychloroquine  200 mg Oral BID  . insulin aspart  0-5 Units Subcutaneous QHS  . insulin aspart  0-6 Units Subcutaneous TID WC  . insulin glargine  5 Units Subcutaneous QPM  . metoprolol tartrate  12.5 mg Oral BID  . metroNIDAZOLE  500 mg Oral BID  . pantoprazole  40 mg Oral BID  . predniSONE  5 mg Oral Q breakfast    Antimicrobials: Anti-infectives (From admission, onward)   Start     Dose/Rate Route Frequency Ordered Stop   07/02/20 1000  hydroxychloroquine (PLAQUENIL) tablet 200 mg        200 mg Oral 2 times daily 07/02/20 0412     07/02/20 1000  amoxicillin (AMOXIL) capsule 1,000 mg        1,000 mg Oral 2 times daily 07/02/20 0418 07/05/20 0959   07/02/20 1000  metroNIDAZOLE (FLAGYL) tablet 500 mg        500 mg Oral 2 times daily 07/02/20 0418 07/05/20 0959   07/02/20 1000  clarithromycin (BIAXIN) tablet 500 mg        500 mg Oral 2 times daily 07/02/20 0418 07/05/20 0959      PRN meds: acetaminophen **OR** acetaminophen, HYDROcodone-acetaminophen, ondansetron **OR** ondansetron (ZOFRAN) IV, polyethylene  glycol   Objective: Vitals:   07/04/20 0455 07/04/20 1214  BP: (!) 116/53 (!) 114/53  Pulse: 66 66  Resp: 15 16  Temp: 98 F (36.7 C) 98.4 F (36.9 C)  SpO2: 98% 98%    Intake/Output Summary (Last 24 hours) at 07/04/2020 1531 Last data filed at 07/04/2020 1217 Gross per 24 hour  Intake 625 ml  Output 1400 ml  Net -775 ml   Filed Weights   07/01/20 2058 07/02/20 1155  Weight: 63 kg 64.5 kg   Weight change:  Body mass index is 22.95 kg/m.   Physical Exam: General exam: Appears calm and comfortable.  Not in physical distress Skin: No rashes, lesions or ulcers. HEENT: Atraumatic, normocephalic, supple neck, no obvious bleeding Lungs: Clear to auscultation bilaterally CVS: Regular rate and rhythm, no murmur GI/Abd soft, nontender, nondistended, bowel sound present CNS: Awake, alert, oriented x3 Psychiatry: Mood appropriate Extremities: Pedal edema trace bilaterally, right more than left.  No calf tenderness.    Data Review: I have personally reviewed the laboratory data and studies available.  Recent Labs  Lab 07/01/20 2220 07/02/20 0415 07/03/20 0332 07/04/20 0843  WBC 10.1 9.2 9.8 9.0  NEUTROABS 8.6*  --  7.9* 7.2  HGB 10.2* 9.6* 9.1* 9.9*  HCT 31.8* 29.8* 28.8* 30.9*  MCV 102.6* 102.4* 104.3* 103.0*  PLT 114* 121* 140* 147*   Recent Labs  Lab 07/01/20 2220 07/02/20 1314 07/03/20 0332 07/04/20 0843  NA 139  --  142 144  K 3.4*  --  2.9* 3.3*  CL 105  --  109 109  CO2 25  --  25 27  GLUCOSE 206*  --  77 94  BUN 34*  --  21 13  CREATININE 1.36*  --  1.05* 0.89  CALCIUM 8.6*  --  8.3* 8.6*  MG  --  1.9 1.8 1.9  PHOS  --   --   --  2.9    F/u labs ordered.  Signed, Terrilee Croak, MD [facility] 07/04/2020

## 2020-07-04 NOTE — TOC Progression Note (Signed)
Transition of Care [facility]) - Progression Note    Patient Details  Name: Patricia Villegas MRN: [id number] Date of Birth: 09-05-1935  Transition of Care [facility]) CM/SW [place], [place] Phone Number: 07/04/2020, 1:40 PM  Clinical Narrative:     CSW met with pt and preferred that she chooses [facility] [place] bed offer. Pt confirms she wants [facility]. CSW explains insurance auth process and discusses plan for transition in next 1-2 days.   CSW called and notified son Kona Yusuf of plan; he is agreeable.   CSW started Auth with [facility]. Ref # [id number]. Clinicals faxed  Expected Discharge Plan: [place] Barriers to Discharge: SNF Pending bed offer  Expected Discharge Plan and Services Expected Discharge Plan: [place]                                               Social Determinants of Health (SDOH) Interventions    Readmission Risk Interventions No flowsheet data found.

## 2020-07-04 NOTE — Progress Notes (Signed)
Physical Therapy Treatment Patient Details Name: Patricia Villegas MRN: [id number] DOB: [birth year] Today's Date: 07/04/2020    History of Present Illness 85 year old female with past medical history of coronary artery disease (CABG 2014), diabetes mellitus type 2, diabetic polyneuropathy, hyperlipidemia, hypertension, diastolic congestive heart failure (Echo 07/2019 EF 60-65%), rheumatoid arthritis, gout, peripheral vascular disease and recent diagnosis of bleeding duodenal ulcers with Helicobacter pylori, recent R THA  presenting to [facility] emergency department with complaints of severe weakness, lightheadedness and fall. Admitted for observation of B LE edema, and generalized weakness. Lumbar CT subacute inferior compression fracture of the L1 vertebral body     PT Comments    Pt agreeable to therapy session, with good participation, but limited due to anxiety with transfers and significant global weakness. Primary session focus on bed mobility and transfer training. Pt continues to require +49maxA for sit<>stand from bed and BSC heights to [facility], and has difficulty with full upright posture/hip extension once standing. Pt unable to attempt weight shifting in stance to initiate pre-gait training this session and significantly fatigued after standing x2 reps to [facility], deferred transfer to chair after toilet transfer and remained in bed at end of session. Pt continues to benefit from PT services to progress toward functional mobility goals. D/C recs below remain appropriate at this time.   Follow Up Recommendations  SNF     Equipment Recommendations  None recommended by PT    Recommendations for Other Services       Precautions / Restrictions Precautions Precautions: Fall Precaution Comments: back precautions for pain control due to L1 subacute compression fx Restrictions Weight Bearing Restrictions: No    Mobility  Bed Mobility Overal bed mobility: Needs Assistance Bed Mobility:  Supine to Sit     Supine to sit: Mod assist Sit to supine: Mod assist   General bed mobility comments: cues for log roll to reduce LBP, pt needs modA for trunk rise  Transfers Overall transfer level: Needs assistance   Transfers: Sit to/from Stand Sit to Stand: Max assist;+2 physical assistance         General transfer comment: heavy +39maxA for sit<>stand, pt able to pull herself up but difficulty maintaining hip extension while weight bearing  Ambulation/Gait                 Stairs             Wheelchair Mobility    Modified Rankin (Stroke Patients Only)       Balance Overall balance assessment: Needs assistance Sitting-balance support: Feet supported Sitting balance-Leahy Scale: Poor Sitting balance - Comments: min guard to minA for seated balance at EOB   Standing balance support: Bilateral upper extremity supported Standing balance-Leahy Scale: Zero Standing balance comment: max cues for hip extension and pulling body toward St[place] but has difficulty 2/2 weakness                            Cognition Arousal/Alertness: Awake/alert Behavior During Therapy: Anxious Overall Cognitive Status: Within Functional Limits for tasks assessed                                 General Comments: most likely baseline memory impairment; decreased STM during session      Exercises      General Comments        Pertinent Vitals/Pain Pain Assessment: 0-10 Pain Score: 4  Pain Location: lower back Pain Descriptors / Indicators: Discomfort;Grimacing;Guarding;Burning Pain Intervention(s): Monitored during session;Premedicated before session;Repositioned   Vitals:   07/04/20 1214  BP: (!) 114/53  Pulse: 66  Resp: 16  Temp: 98.4 F (36.9 C)  SpO2: 98%    Home Living                      Prior Function            PT Goals (current goals can now be found in the care plan section) Acute Rehab PT Goals Patient Stated  Goal: to go to rehab and get stronger PT Goal Formulation: With patient Time For Goal Achievement: 06/22/20 Potential to Achieve Goals: Good Progress towards PT goals: Progressing toward goals    Frequency    Min 2X/week      PT Plan Current plan remains appropriate    Co-evaluation              AM-PAC PT "6 Clicks" Mobility   Outcome Measure  Help needed turning from your back to your side while in a flat bed without using bedrails?: A Little Help needed moving from lying on your back to sitting on the side of a flat bed without using bedrails?: A Lot Help needed moving to and from a bed to a chair (including a wheelchair)?: A Lot Help needed standing up from a chair using your arms (e.g., wheelchair or bedside chair)?: A Lot Help needed to walk in hospital room?: Total Help needed climbing 3-5 steps with a railing? : Total 6 Click Score: 11    End of Session Equipment Utilized During Treatment: Gait belt Activity Tolerance: Patient tolerated treatment well Patient left: in bed;with bed alarm set;with call bell/phone within reach;with nursing/sitter in room Nurse Communication: Mobility status PT Visit Diagnosis: Unsteadiness on feet (R26.81);Other abnormalities of gait and mobility (R26.89);Muscle weakness (generalized) (M62.81);Pain Pain - Right/Left: Right Pain - part of body: Hip     Time: 4034-7425 PT Time Calculation (min) (ACUTE ONLY): 33 min  Charges:  $Therapeutic Activity: 23-37 mins                     Elisha Mcgruder P., PTA Acute Rehabilitation Services Pager: [phone] Office: [place] 07/04/2020, 3:48 PM

## 2020-07-04 NOTE — Progress Notes (Signed)
ANTICOAGULATION CONSULT NOTE - Initial Consult  Pharmacy Consult for apixaban Indication: DVT  No Known Allergies  Patient Measurements: Height: 5\' 6"  (167.6 cm) Weight: 64.5 kg (142 lb 3.2 oz) IBW/kg (Calculated) : 59.3  Vital Signs: Temp: 98 F (36.7 C) (10/26 0455) Temp Source: Oral (10/26 0455) BP: 116/53 (10/26 0455) Pulse Rate: 66 (10/26 0455)  Labs: Recent Labs    07/01/20 2220 07/01/20 2220 07/02/20 0415 07/02/20 1314 07/03/20 0332  HGB 10.2*   < > 9.6*  --  9.1*  HCT 31.8*  --  29.8*  --  28.8*  PLT 114*  --  121*  --  140*  CREATININE 1.36*  --   --   --  1.05*  TROPONINIHS  --   --   --  59*  --    < > = values in this interval not displayed.    Estimated Creatinine Clearance: 38 mL/min (A) (by C-G formula based on SCr of 1.05 mg/dL (H)).   Medical History: Past Medical History:  Diagnosis Date  . Anemia   . Anemia 06/14/2020  . Anxiety   . Arthritis   . Blood transfusion   . Chest pain    a. Lexiscan Myoview 12/12:  EF 75%, no ischemia or scar;  b.  Echo 12/12:  EF 60-65%, no sig valvular abnormalities   . Closed left fibular fracture    Casted   . Complication of anesthesia 1998   Unable to arouse fully, respiratory depression, pt. reports that the family was told that she would need a trach., but then she stabilized & didn't need it   . Coronary artery disease    sees Dr Aundra Dubin every 6 months  . Depression   . Diabetes mellitus    Greater than 20 years type 2   . GERD (gastroesophageal reflux disease)   . H/O hiatal hernia   . Hypercholesteremia   . Hypertension    Greater than 20 years  . Lumbar disc disease    S/p surgery x4;  Sciatica   . Neuropathy due to secondary diabetes ([place])    Hx: of  . Parastomal hernia of ileal conduit 2009  . Pneumonia    s/p CABG  . [name] (peripheral vascular disease) ([facility])    Stable claudication. Arteriogram 07/2009 no significant aortoiliac disease, has bilateral SFA occlusions with distal  reconstitution   . Rheumatoid arthritis ([place])    Dr Berna Bue @ [facility]  . Transfusion reaction 1960   Convulsions (? seizure)  . UTI (lower urinary tract infection) 01/03/15   Assessment: 18 yof presented to the hospital with weakness. She was recently discharged s/p GIB and H. Pylori infection. She continues on antibiotics which would complete today. Found to have an acute DVT and now starting apixaban. Hgb is low stable and platelets are low but improving as of 10/25. No new bleeding noted.   Goal of Therapy:  Therapeutic anticoagulation  Monitor platelets by anticoagulation protocol: Yes   Plan:  Apixaban 10mg  PO BID x 7 days the 5mg  PO BID thereafter Will need to monitor for bleeding closely in the setting of recent GIB  Alegandro Macnaughton, Rande Lawman 07/04/2020,8:15 AM

## 2020-07-05 LAB — GLUCOSE, CAPILLARY
Glucose-Capillary: 79 mg/dL (ref 70–99)
Glucose-Capillary: 104 mg/dL — ABNORMAL HIGH (ref 70–99)
Glucose-Capillary: 285 mg/dL — ABNORMAL HIGH (ref 70–99)
Glucose-Capillary: 136 mg/dL — ABNORMAL HIGH (ref 70–99)

## 2020-07-05 LAB — CBC WITH DIFFERENTIAL/PLATELET
Abs Immature Granulocytes: 0.15 10*3/uL — ABNORMAL HIGH (ref 0.00–0.07)
Basophils Absolute: 0 10*3/uL (ref 0.0–0.1)
Basophils Relative: 0 %
Eosinophils Absolute: 0 10*3/uL (ref 0.0–0.5)
Eosinophils Relative: 0 %
HCT: 31.1 % — ABNORMAL LOW (ref 36.0–46.0)
Hemoglobin: 9.8 g/dL — ABNORMAL LOW (ref 12.0–15.0)
Immature Granulocytes: 2 %
Lymphocytes Relative: 10 %
Lymphs Abs: 1 10*3/uL (ref 0.7–4.0)
MCH: 32.2 pg (ref 26.0–34.0)
MCHC: 31.5 g/dL (ref 30.0–36.0)
MCV: 102.3 fL — ABNORMAL HIGH (ref 80.0–100.0)
Monocytes Absolute: 0.9 10*3/uL (ref 0.1–1.0)
Monocytes Relative: 9 %
Neutro Abs: 7.8 10*3/uL — ABNORMAL HIGH (ref 1.7–7.7)
Neutrophils Relative %: 79 %
Platelets: 149 10*3/uL — ABNORMAL LOW (ref 150–400)
RBC: 3.04 MIL/uL — ABNORMAL LOW (ref 3.87–5.11)
RDW: 18.6 % — ABNORMAL HIGH (ref 11.5–15.5)
WBC: 9.8 10*3/uL (ref 4.0–10.5)
nRBC: 0 % (ref 0.0–0.2)

## 2020-07-05 LAB — BASIC METABOLIC PANEL
Anion gap: 9 (ref 5–15)
BUN: 17 mg/dL (ref 8–23)
CO2: 26 mmol/L (ref 22–32)
Calcium: 8.6 mg/dL — ABNORMAL LOW (ref 8.9–10.3)
Chloride: 109 mmol/L (ref 98–111)
Creatinine, Ser: 1.21 mg/dL — ABNORMAL HIGH (ref 0.44–1.00)
GFR, Estimated: 44 mL/min — ABNORMAL LOW (ref >60)
Glucose, Bld: 87 mg/dL (ref 70–99)
Potassium: 4 mmol/L (ref 3.5–5.1)
Sodium: 144 mmol/L (ref 135–145)

## 2020-07-05 MED ORDER — SODIUM CHLORIDE 0.9 % IV SOLN: INTRAVENOUS | Status: DC

## 2020-07-05 MED ORDER — MELATONIN 3 MG PO TABS
3.0000 mg | ORAL_TABLET | Freq: Every day | ORAL | Status: DC
  Administered 2020-07-05: 3 mg via ORAL
  Filled 2020-07-05: qty 1

## 2020-07-05 NOTE — TOC Progression Note (Signed)
Transition of Care [facility]) - Progression Note    Patient Details  Name: Patricia Villegas MRN: [id number] Date of Birth: 11-27-1934  Transition of Care [facility]) CM/SW [place], [place] Phone Number: 07/05/2020, 10:15 AM  Clinical Narrative:     Josem Kaufmann received #[id number]. Approved from 10/28 -- 10/30.   CSW informed pt son that pt would be d/cing 10/28 per MD.   CSW updated [facility] on d/c date   Expected Discharge Plan: [place] Barriers to Discharge: SNF Pending bed offer  Expected Discharge Plan and Services Expected Discharge Plan: [place]                                               Social Determinants of Health (SDOH) Interventions    Readmission Risk Interventions No flowsheet data found.

## 2020-07-05 NOTE — Progress Notes (Signed)
Pt had a dark BM, Dr. Pietro Cassis notified since pt had just restarted her eliquis today, no active bleeding or bright red blood.  No new orders.

## 2020-07-05 NOTE — Progress Notes (Signed)
PROGRESS NOTE  Patricia Villegas  DOB: 1935-01-09  PCP: Haywood Pao, MD GUR:[id number]  DOA: 07/01/2020  LOS: 2 days   Chief Complaint  Patient presents with  . Fall  . GI Bleeding   Brief narrative: Patricia Villegas is a 85 y.o. female with PMH of DM2, HTN, [name], CAD(CABG 2831), PAD, diastolic CHF, rheumatoid arthritis, gout who underwent a hip surgery in September and was last hospitalized 10/6-10/10 for bleeding duodenal ulcers with H. pylori infection.  Patient was brought from home to the ED on 10/23 for fall to her knees.  During her last hospitalization for GI bleeding, she had hemoglobin low at 6, received 2 units of PRBCs, patient underwent EGD, noted to have 2 duodenal ulcers  Pathology identified H. pylori infection for which she was placed on triple therapy with clarithromycin, metronidazole and amoxicillin. Since her discharge on 10/10, she was getting physical therapy at home and her weakness was gradually improving.  However for few days prior to presentation, patient started to get weak again.  Also reports regular blood bouts of black stools.  Patient is on iron supplement.   On 10/23, patient had an episode of extreme lightheadedness and fall without loss of consciousness.  EMS was called and she was subsequently brought to ED.  In the ED, patient was afebrile, heart rate in 70s, blood pressure in 100s.  She was extremely weak and looked dry on examination. Lab with WBC normal, hemoglobin 10.2, platelet 114, potassium 3.4, creatinine 1.36 Patient was admitted to hospital service for further evaluation management.  Subjective: Patient was seen and examined this morning. Lying down in bed.  Not in distress.   Patient seems clinically dry.  She agrees she has not been eating or drinking enough. Creatinine up to 1.21 this morning.  Assessment/Plan: AKI  -presented with progressive generalized weakness leading to lightheadedness and fall. -And was elevated to 1.36 on  admission. Improved with IV hydration.  -Creatinine elevated again at 1.21 this morning.  Resumed her on IV fluid this morning. -Repeat creatinine tomorrow. Recent Labs    05/24/20 1201 06/08/20 1629 06/14/20 1351 06/15/20 0135 06/16/20 0158 06/17/20 0221 07/01/20 2220 07/03/20 0332 07/04/20 0843 07/05/20 0249  BUN 30* 22 33* 29* 16 12 34* 21 13 17   CREATININE 1.26* 1.29* 1.57* 1.15* 0.87 0.90 1.36* 1.05* 0.89 1.21*   Acute left leg DVT -Patient has bilateral leg swelling right more than left.  Ultrasound duplex 10/25 noted an acute DVT of the less swollen left leg involving the left posterior tibial veins, left peroneal veins, and left gastrocnemius veins -I had a discussion with patient this morning about the risk and benefit of anticoagulation.  She was recently hospitalized for duodenal ulcer and is currently on Protonix twice daily and triple therapy. -Anticoagulation increases her risk of bleeding but as long as he stays on Protonix twice daily the benefits of anticoagulation outweighs the risk.  Patient agrees to proceed with anticoagulation.  Started on Eliquis 10 mg twice daily this morning.  Generalized weakness Lightheadedness, fall -PT evaluation was obtained.  SNF recommended.  Patient and husband wants to go to [facility].  Chronic diastolic CHF Essential hypertension Bilateral lower extremity edema -urinalysis without albuminuria -Echocardiogram with EF 60 to 51%, grade 1 diastolic dysfunction, mildly elevated right ventricular pressure. -Home meds include Lopressor 12.5 mg twice daily, Lasix 40 mg daily, Benazepril 20 mg daily, -Continue Lopressor.  Currently Lasix and benazepril are on hold. -Blood pressure in low normal range, her  intake is poor and she has AKI.  I would continue to hold them.  CAD(CABG 2014), PAD, [name] - Patient has known history of significant PAD with critical ischemia of the right lower extremity identified during most recent hospitalization from  10/6-10/10. Patient was evaluated by vascular surgery at that time but was recommended that the patient remain on medical management with outpatient follow-up. -No significant cyanosis or pain of the right lower extremity, continue outpatient follow-up. -Continue aspirin 81 mg daily, simvastatin 80 mg daily,  Rheumatoid arthritis -Continue home regimen of Methotrexate weekly, Plaquenil 200 mg twice daily, prednisone 5 mg daily, Zanaflex as needed, Norco as needed, Senokot.  Chronic macrocytic anemia -Hemoglobin stable. -Continue folic acid. Recent Labs    06/14/20 1351 06/15/20 0135 07/01/20 2220 07/01/20 2220 07/02/20 0415 07/02/20 0415 07/03/20 0332 07/03/20 0332 07/04/20 0843 07/05/20 0249  HGB 7.7*   < > 10.2*  --  9.6*  --  9.1*  --  9.9* 9.8*  MCV 108.3*   < > 102.6*   < > 102.4*   < > 104.3*   < > 103.0* 102.3*  VITAMINB12 471  --   --   --   --   --   --   --   --   --   FOLATE >100.0  --   --   --   --   --   --   --   --   --   FERRITIN 156  --   --   --   --   --   --   --   --   --   TIBC 300  --   --   --   --   --   --   --   --   --   IRON 62  --   --   --   --   --   --   --   --   --    < > = values in this interval not displayed.   Recent history of duodenal ulcer bleeding/H. pylori infection -Treated the course of triple therapy with amoxicillin, clarithromycin and metronidazole on 10/27. -Continue Protonix twice daily,  Type 2 diabetes mellitus  Diabetic polyneuropathy  -A1c 6.1 on 10/6 -Currently on Lantus dose to 5 units nightly and very sensitive sliding scale insulin. -Blood sugar low at 79 this morning. Recent Labs  Lab 07/04/20 0845 07/04/20 1210 07/04/20 1652 07/04/20 2144 07/05/20 0754  GLUCAP 77 224* 136* 110* 79   Gout -Continue Allopurinol   Mobility: PT eval Code Status:   Code Status: Full Code  Nutritional status: Body mass index is 22.95 kg/m.     Diet Order            Diet heart healthy/carb modified Room service  appropriate? Yes with Assist; Fluid consistency: Thin  Diet effective now                 DVT prophylaxis: SCDs Start: 07/02/20 0413   Antimicrobials:  None Fluid: IV fluid is stopped. Consultants: None Family Communication:  None at bedside  Status is: Inpatient  Remains inpatient appropriate because -I would hold discharge today because of AKI.   Dispo: The patient is from: Home              Anticipated d/c is to: [facility]              Anticipated d/c date is: Hopefully discharge tomorrow after  24-hour of hydration              Patient currently is not medically stable to d/c.  Infusions:  . sodium chloride 75 mL/hr at 07/05/20 0818    Scheduled Meds: . allopurinol  200 mg Oral Daily  . apixaban  10 mg Oral BID   Followed by  . [START ON 07/17/2020] apixaban  5 mg Oral BID  . aspirin EC  81 mg Oral Daily  . atorvastatin  40 mg Oral Daily  . feeding supplement  237 mL Oral BID BM  . folic acid  2 mg Oral Daily  . hydroxychloroquine  200 mg Oral BID  . insulin aspart  0-5 Units Subcutaneous QHS  . insulin aspart  0-6 Units Subcutaneous TID WC  . insulin glargine  5 Units Subcutaneous QPM  . metoprolol tartrate  12.5 mg Oral BID  . pantoprazole  40 mg Oral BID  . predniSONE  5 mg Oral Q breakfast    Antimicrobials: Anti-infectives (From admission, onward)   Start     Dose/Rate Route Frequency Ordered Stop   07/02/20 1000  hydroxychloroquine (PLAQUENIL) tablet 200 mg        200 mg Oral 2 times daily 07/02/20 0412     07/02/20 1000  amoxicillin (AMOXIL) capsule 1,000 mg        1,000 mg Oral 2 times daily 07/02/20 0418 07/04/20 2219   07/02/20 1000  metroNIDAZOLE (FLAGYL) tablet 500 mg        500 mg Oral 2 times daily 07/02/20 0418 07/04/20 2218   07/02/20 1000  clarithromycin (BIAXIN) tablet 500 mg        500 mg Oral 2 times daily 07/02/20 0418 07/04/20 2218      PRN meds: acetaminophen **OR** acetaminophen, HYDROcodone-acetaminophen, ondansetron **OR**  ondansetron (ZOFRAN) IV, polyethylene glycol   Objective: Vitals:   07/04/20 2006 07/05/20 0531  BP: (!) 136/53 131/87  Pulse: 73 70  Resp: 15 15  Temp: 98.6 F (37 C) 98.2 F (36.8 C)  SpO2: 96% 99%    Intake/Output Summary (Last 24 hours) at 07/05/2020 1019 Last data filed at 07/05/2020 0813 Gross per 24 hour  Intake 600 ml  Output 2101 ml  Net -1501 ml   Filed Weights   07/01/20 2058 07/02/20 1155  Weight: 63 kg 64.5 kg   Weight change:  Body mass index is 22.95 kg/m.   Physical Exam: General exam: Appears calm and comfortable. Not in physical distress Skin: No rashes, lesions or ulcers.  Looks dry overall. HEENT: Atraumatic, normocephalic, supple neck, no obvious bleeding Lungs: Clear to auscultation bilaterally. CVS: Regular rate and rhythm, no murmur GI/Abd soft, nontender, nondistended, bowel sound present CNS: Awake, alert, oriented x3 Psychiatry: Mood appropriate Extremities: Pedal edema trace bilaterally, right more than left.  No calf tenderness.    Data Review: I have personally reviewed the laboratory data and studies available.  Recent Labs  Lab 07/01/20 2220 07/02/20 0415 07/03/20 0332 07/04/20 0843 07/05/20 0249  WBC 10.1 9.2 9.8 9.0 9.8  NEUTROABS 8.6*  --  7.9* 7.2 7.8*  HGB 10.2* 9.6* 9.1* 9.9* 9.8*  HCT 31.8* 29.8* 28.8* 30.9* 31.1*  MCV 102.6* 102.4* 104.3* 103.0* 102.3*  PLT 114* 121* 140* 147* 149*   Recent Labs  Lab 07/01/20 2220 07/02/20 1314 07/03/20 0332 07/04/20 0843 07/05/20 0249  NA 139  --  142 144 144  K 3.4*  --  2.9* 3.3* 4.0  CL 105  --  109 109  109  CO2 25  --  25 27 26   GLUCOSE 206*  --  77 94 87  BUN 34*  --  21 13 17   CREATININE 1.36*  --  1.05* 0.89 1.21*  CALCIUM 8.6*  --  8.3* 8.6* 8.6*  MG  --  1.9 1.8 1.9  --   PHOS  --   --   --  2.9  --     F/u labs ordered.  Signed, Terrilee Croak, MD [facility] 07/05/2020

## 2020-07-05 NOTE — Progress Notes (Signed)
Asked Dr. Pietro Cassis for melatonin order as pt takes at home sometimes, ordered for tonight for sleep. Pt c/o pain in the heels and feet, applied bilateral heel protectors for protection, pt states it feels much better.

## 2020-07-05 NOTE — Progress Notes (Signed)
Spoke with son and daughter in law, [name] and Brent and updated them on the plan of care.  They had an understanding of the plan for potential DC to [facility] on 10/28 pending labwork and medical readiness.

## 2020-07-05 NOTE — NC FL2 (Signed)
[place] LEVEL OF CARE SCREENING TOOL     IDENTIFICATION  Patient Name: Patricia Villegas Birthdate: 1935/04/24 Sex: female Admission Date (Current Location): 07/01/2020  [facility] and [place] Number:  Herbalist and Address:  [facility]. [facility], [place] [address] [place], [place] [address]      Provider Number: [id number]  Attending Physician Name and Address:  Terrilee Croak, MD  Relative Name and Phone Number:  ALVILDA, MCKENNA) [phone] [facility])    Current Level of Care: Hospital Recommended Level of Care: [place] Prior Approval Number:    Date Approved/Denied: 07/03/20 PASRR Number: [id number]  Discharge Plan: SNF    Current Diagnoses: Patient Active Problem List   Diagnosis Date Noted  . Generalized weakness 07/02/2020  . Type 2 diabetes mellitus with diabetic polyneuropathy, with long-term current use of insulin ([place]) 07/02/2020  . Coronary artery disease involving native coronary artery of native heart without angina pectoris 07/02/2020  . Bilateral leg edema 07/02/2020  . Duodenal ulcer   . Benign neoplasm of sigmoid colon   . Gastrointestinal hemorrhage   . Macrocytic anemia 06/15/2020  . Symptomatic anemia 06/14/2020  . Primary localized osteoarthritis of right hip 05/30/2020  . S/P total hip arthroplasty 05/30/2020  . Primary osteoarthritis of right hip 05/30/2020  . Carotid bruit 03/21/2016  . Primary osteoarthritis of left hip 01/10/2015  . Chronic diastolic CHF (congestive heart failure) ([place]) 06/30/2013  . CAD (coronary artery disease) of artery bypass graft 05/18/2013  . Femoral artery occlusion, bilateral 05/18/2013  . Diabetes 1.5, managed as type 2 ([place]) 05/18/2013  . Rheumatoid arthritis ([place]) 05/18/2013  . Chest pain 09/18/2011  . Peripheral vascular disease ([place]) 09/18/2011  . Essential hypertension 08/23/2011  . Mixed hyperlipidemia 08/23/2011    Orientation RESPIRATION BLADDER  Height & Weight     Self, Time, Situation, Place  Normal External catheter Weight: 142 lb 3.2 oz (64.5 kg) Height:  5\' 6"  (167.6 cm)  BEHAVIORAL SYMPTOMS/MOOD NEUROLOGICAL BOWEL NUTRITION STATUS   (none)   Continent Diet  AMBULATORY STATUS COMMUNICATION OF NEEDS Skin   Extensive Assist Verbally Surgical wounds                       Personal Care Assistance Level of Assistance  Bathing, Feeding, Dressing Bathing Assistance: Limited assistance Feeding assistance: Independent Dressing Assistance: Limited assistance     Functional Limitations Info  Sight, Hearing, Speech Sight Info: Adequate Hearing Info: Adequate Speech Info: Adequate    SPECIAL CARE FACTORS FREQUENCY  PT (By licensed PT), OT (By licensed OT)     PT Frequency: 5x/week OT Frequency: 5x/week            Contractures Contractures Info: Not present    Additional Factors Info  Allergies, Code Status Code Status Info: Full code Allergies Info: no known allergies           Current Medications (07/05/2020):  This is the current hospital active medication list Current Facility-Administered Medications  Medication Dose Route Frequency Provider Last Rate Last Admin  . 0.9 %  sodium chloride infusion   Intravenous Continuous Terrilee Croak, MD 75 mL/hr at 07/05/20 0818 New Bag at 07/05/20 0818  . acetaminophen (TYLENOL) tablet 650 mg  650 mg Oral Q6H PRN Shalhoub, [name], MD       Or  . acetaminophen (TYLENOL) suppository 650 mg  650 mg Rectal Q6H PRN Shalhoub, [name], MD      . allopurinol (  ZYLOPRIM) tablet 200 mg  200 mg Oral Daily Shalhoub, [name], MD   200 mg at 07/05/20 1139  . apixaban (ELIQUIS) tablet 10 mg  10 mg Oral BID Rumbarger, Valeda Malm, RPH   10 mg at 07/05/20 1137   Followed by  . [START ON 08/02/2020] apixaban (ELIQUIS) tablet 5 mg  5 mg Oral BID Rumbarger, Valeda Malm, RPH      . aspirin EC tablet 81 mg  81 mg Oral Daily Vernelle Emerald, MD   81 mg at 07/05/20 1138  . atorvastatin  (LIPITOR) tablet 40 mg  40 mg Oral Daily Shalhoub, [name], MD   40 mg at 07/05/20 1137  . feeding supplement (ENSURE ENLIVE / ENSURE PLUS) liquid 237 mL  237 mL Oral BID BM Shalhoub, [name], MD   237 mL at 07/05/20 1139  . folic acid (FOLVITE) tablet 2 mg  2 mg Oral Daily Shalhoub, [name], MD   2 mg at 07/05/20 1138  . HYDROcodone-acetaminophen (NORCO/VICODIN) 5-325 MG per tablet 1-2 tablet  1-2 tablet Oral Q6H PRN Shalhoub, [name], MD      . hydroxychloroquine (PLAQUENIL) tablet 200 mg  200 mg Oral BID Vernelle Emerald, MD   200 mg at 07/05/20 1139  . insulin aspart (novoLOG) injection 0-5 Units  0-5 Units Subcutaneous QHS Dahal, Binaya, MD      . insulin aspart (novoLOG) injection 0-6 Units  0-6 Units Subcutaneous TID WC Dahal, Binaya, MD      . insulin glargine (LANTUS) injection 5 Units  5 Units Subcutaneous QPM Dahal, Marlowe Aschoff, MD   5 Units at 07/04/20 1737  . metoprolol tartrate (LOPRESSOR) tablet 12.5 mg  12.5 mg Oral BID Vernelle Emerald, MD   12.5 mg at 07/05/20 1138  . ondansetron (ZOFRAN) tablet 4 mg  4 mg Oral Q6H PRN Shalhoub, [name], MD       Or  . ondansetron [facility]) injection 4 mg  4 mg Intravenous Q6H PRN Shalhoub, [name], MD      . pantoprazole (PROTONIX) EC tablet 40 mg  40 mg Oral BID Vernelle Emerald, MD   40 mg at 07/05/20 1138  . polyethylene glycol (MIRALAX / GLYCOLAX) packet 17 g  17 g Oral Daily PRN Terrilee Croak, MD   17 g at 07/04/20 1645  . predniSONE (DELTASONE) tablet 5 mg  5 mg Oral Q breakfast Shalhoub, [name], MD   5 mg at 07/05/20 0805     Discharge Medications: Please see discharge summary for a list of discharge medications.  Relevant Imaging Results:  Relevant Lab Results:   Additional Information SSN [place], [place]

## 2020-07-05 NOTE — Progress Notes (Signed)
Occupational Therapy Treatment Patient Details Name: Patricia Villegas MRN: [id number] DOB: 1935/01/05 Today's Date: 07/05/2020    History of present illness 85 year old female with past medical history of coronary artery disease (CABG 2014), diabetes mellitus type 2, diabetic polyneuropathy, hyperlipidemia, hypertension, diastolic congestive heart failure (Echo 07/2019 EF 60-65%), rheumatoid arthritis, gout, peripheral vascular disease and recent diagnosis of bleeding duodenal ulcers with Helicobacter pylori, recent R THA  presenting to [facility] emergency department with complaints of severe weakness, lightheadedness and fall. Admitted for observation of B LE edema, and generalized weakness. Lumbar CT subacute inferior compression fracture of the L1 vertebral body    OT comments  OT treatment session with focus on bed mobility, self-care re-education, sitting balance, activity tolerance, and functional transfers with use of Stedy. Patient required Mod A for supine to EOB at BLE and trunk with cues for technique. Seated EOB, patient able to maintain static sitting balance with BUE supported on bed surface with Min guard. With unilateral UE support on bed surface, patient demonstrates need for increased assistance. 2/3 grooming tasks seated EOB with Min A. Max A +2 for sit to stand in [place] with multimodal cues for hip extension and to keep trunk upright. Patient with difficulty pushing down through BUE to support body weight 2/2 generalized weakness. Patient continues to benefit from skilled OT services acutely with recommendation for SNF rehab given patients CLOF.    Follow Up Recommendations  SNF;Supervision/Assistance - 24 hour    Equipment Recommendations  Other (comment) (Defer to next level of care)    Recommendations for Other Services      Precautions / Restrictions Precautions Precautions: Fall Precaution Comments: back precautions for pain control due to L1 subacute compression  fx Restrictions Weight Bearing Restrictions: No       Mobility Bed Mobility Overal bed mobility: Needs Assistance Bed Mobility: Supine to Sit     Supine to sit: Mod assist     General bed mobility comments: cues for log roll to reduce LBP, pt needs modA for trunk rise  Transfers Overall transfer level: Needs assistance   Transfers: Sit to/from Stand Sit to Stand: Max assist;+2 physical assistance         General transfer comment: Maximal multimodal cues for sit to stand in [place].     Balance Overall balance assessment: Needs assistance Sitting-balance support: Feet supported Sitting balance-Leahy Scale: Poor Sitting balance - Comments: min guard to minA for seated balance at EOB with BUE supported on bed surface. Patient requries up to Mod A with fatigue   Standing balance support: Bilateral upper extremity supported Standing balance-Leahy Scale: Zero Standing balance comment: max cues for hip extension and pulling body toward Stedy but has difficulty 2/2 weakness                           ADL either performed or assessed with clinical judgement   ADL       Grooming: Minimal assistance;Sitting               Lower Body Dressing: Maximal assistance;Sit to/from stand                       Vision       Perception     Praxis      Cognition Arousal/Alertness: Awake/alert Behavior During Therapy: Anxious Overall Cognitive Status: Within Functional Limits for tasks assessed  General Comments: most likely baseline memory impairment; decreased STM during session        Exercises     Shoulder Instructions       General Comments      Pertinent Vitals/ Pain       Pain Assessment: 0-10 Pain Score: 2  Pain Location: Bilateral feet Pain Descriptors / Indicators: Pins and needles;[place]                                          Prior  Functioning/Environment              Frequency  Min 2X/week        Progress Toward Goals  OT Goals(current goals can now be found in the care plan section)  Progress towards OT goals: Progressing toward goals  Acute Rehab OT Goals Patient Stated Goal: to go to rehab and get stronger OT Goal Formulation: With patient Time For Goal Achievement: 07/17/20 Potential to Achieve Goals: Good ADL Goals Pt Will Perform Grooming: with modified independence;standing Pt Will Perform Upper Body Bathing: with set-up;sitting Pt Will Perform Lower Body Bathing: with min assist;sit to/from stand Pt Will Perform Lower Body Dressing: with min assist;sit to/from stand;with adaptive equipment Pt Will Transfer to Toilet: with min assist;ambulating;bedside commode Pt Will Perform Toileting - Clothing Manipulation and hygiene: sit to/from stand;with min assist Pt Will Perform Tub/Shower Transfer: with modified independence;ambulating  Plan Discharge plan remains appropriate    Co-evaluation                 AM-PAC OT "6 Clicks" Daily Activity     Outcome Measure   Help from another person eating meals?: None Help from another person taking care of personal grooming?: A Little Help from another person toileting, which includes using toliet, bedpan, or urinal?: A Lot Help from another person bathing (including washing, rinsing, drying)?: A Lot Help from another person to put on and taking off regular upper body clothing?: A Lot Help from another person to put on and taking off regular lower body clothing?: A Lot 6 Click Score: 15    End of Session Equipment Utilized During Treatment: Gait belt  OT Visit Diagnosis: Unsteadiness on feet (R26.81);Other abnormalities of gait and mobility (R26.89);History of falling (Z91.81);Muscle weakness (generalized) (M62.81);Pain;Dizziness and giddiness (R42)   Activity Tolerance Patient tolerated treatment well   Patient Left in chair;with call  bell/phone within reach;with chair alarm set   Nurse Communication          Time: [phone] OT Time Calculation (min): 38 min  Charges: OT General Charges $OT Visit: 1 Visit OT Treatments $Self Care/Home Management : 8-22 mins $Therapeutic Activity: 23-37 mins  Pinki Rottman H. OTR/L Supplemental OT, Department of rehab services [phone]   Jennesis Ramaswamy R H. 07/05/2020, 9:49 AM

## 2020-07-06 LAB — CBC WITH DIFFERENTIAL/PLATELET
Abs Immature Granulocytes: 0.14 10*3/uL — ABNORMAL HIGH (ref 0.00–0.07)
Basophils Absolute: 0 10*3/uL (ref 0.0–0.1)
Basophils Relative: 0 %
Eosinophils Absolute: 0 10*3/uL (ref 0.0–0.5)
Eosinophils Relative: 0 %
HCT: 28.8 % — ABNORMAL LOW (ref 36.0–46.0)
Hemoglobin: 9.1 g/dL — ABNORMAL LOW (ref 12.0–15.0)
Immature Granulocytes: 2 %
Lymphocytes Relative: 11 %
Lymphs Abs: 1 10*3/uL (ref 0.7–4.0)
MCH: 32.7 pg (ref 26.0–34.0)
MCHC: 31.6 g/dL (ref 30.0–36.0)
MCV: 103.6 fL — ABNORMAL HIGH (ref 80.0–100.0)
Monocytes Absolute: 0.9 10*3/uL (ref 0.1–1.0)
Monocytes Relative: 10 %
Neutro Abs: 6.9 10*3/uL (ref 1.7–7.7)
Neutrophils Relative %: 77 %
Platelets: 156 10*3/uL (ref 150–400)
RBC: 2.78 MIL/uL — ABNORMAL LOW (ref 3.87–5.11)
RDW: 18.5 % — ABNORMAL HIGH (ref 11.5–15.5)
WBC: 8.9 10*3/uL (ref 4.0–10.5)
nRBC: 0 % (ref 0.0–0.2)

## 2020-07-06 LAB — BASIC METABOLIC PANEL
Anion gap: 7 (ref 5–15)
BUN: 15 mg/dL (ref 8–23)
CO2: 26 mmol/L (ref 22–32)
Calcium: 8.3 mg/dL — ABNORMAL LOW (ref 8.9–10.3)
Chloride: 109 mmol/L (ref 98–111)
Creatinine, Ser: 0.9 mg/dL (ref 0.44–1.00)
GFR, Estimated: >60 mL/min (ref >60)
Glucose, Bld: 107 mg/dL — ABNORMAL HIGH (ref 70–99)
Potassium: 4.2 mmol/L (ref 3.5–5.1)
Sodium: 142 mmol/L (ref 135–145)

## 2020-07-06 LAB — SARS CORONAVIRUS 2 BY RT PCR (HOSPITAL ORDER, PERFORMED IN ~~LOC~~ HOSPITAL LAB): SARS Coronavirus 2: NEGATIVE

## 2020-07-06 LAB — GLUCOSE, CAPILLARY: Glucose-Capillary: 108 mg/dL — ABNORMAL HIGH (ref 70–99)

## 2020-07-06 MED ORDER — FUROSEMIDE 40 MG PO TABS: 40.0000 mg | ORAL_TABLET | Freq: Every day | ORAL | Status: DC | PRN

## 2020-07-06 MED ORDER — MELATONIN 3 MG PO TABS
3.0000 mg | ORAL_TABLET | Freq: Every day | ORAL | 0 refills | Status: DC
Start: 2020-07-06 — End: 2020-07-28

## 2020-07-06 MED ORDER — INSULIN ASPART 100 UNIT/ML ~~LOC~~ SOLN
0.0000 [IU] | Freq: Three times a day (TID) | SUBCUTANEOUS | 11 refills | Status: DC
Start: 2020-07-06 — End: 2020-07-28

## 2020-07-06 MED ORDER — APIXABAN 5 MG PO TABS: ORAL_TABLET | ORAL | Status: DC

## 2020-07-06 MED ORDER — INSULIN ASPART 100 UNIT/ML ~~LOC~~ SOLN
0.0000 [IU] | Freq: Every day | SUBCUTANEOUS | 11 refills | Status: DC
Start: 2020-07-06 — End: 2020-07-28

## 2020-07-06 MED ORDER — INSULIN GLARGINE 100 UNIT/ML ~~LOC~~ SOLN
5.0000 [IU] | Freq: Every evening | SUBCUTANEOUS | 11 refills | Status: DC
Start: 2020-07-06 — End: 2020-07-28

## 2020-07-06 NOTE — Progress Notes (Signed)
Report called to [facility].  Talked with the nurse name Caryl Pina.  Discharge: Pt d/c from room via stretcher with EMS,  Discharge instructions given to the patient and placed In discharge packet.  No questions from pt,  Pt dressed in street clothes and left with discharge papers  IV d/ced, tele removed and no complaints of pain or discomfort.

## 2020-07-06 NOTE — Discharge Summary (Signed)
Physician Discharge Summary  Patricia Villegas DQQ:[id number] DOB: 01/09/1935 DOA: 07/01/2020  PCP: Haywood Pao, MD  Admit date: 07/01/2020 Discharge date: 07/06/2020  Admitted From: Home Discharge disposition: SNF   Code Status: Full Code  Diet Recommendation: Cardiac/diabetic diet  Discharge Diagnosis:   Principal Problem:   Generalized weakness Active Problems:   Essential hypertension   Mixed hyperlipidemia   Peripheral vascular disease ([place])   Rheumatoid arthritis ([place])   Chronic diastolic CHF (congestive heart failure) ([place])   Macrocytic anemia   Duodenal ulcer   Type 2 diabetes mellitus with diabetic polyneuropathy, with long-term current use of insulin (HCC)   Coronary artery disease involving native coronary artery of native heart without angina pectoris   Bilateral leg edema  History of Present Illness / Brief narrative:  Patricia Villegas is a 85 y.o. female with PMH of DM2, HTN, [name], CAD(CABG 1941),DEY, diastolic CHF, rheumatoid arthritis, gout who underwent a hip surgery in September and was last hospitalized 10/6-10/10 for bleeding duodenal ulcers with H. pylori infection.  Patient was brought from home to the ED on 10/23 for fall to her knees.  During her last hospitalization for GI bleeding, she had hemoglobin low at 6, received 2 units of PRBCs, patient underwent EGD, noted to have 2 duodenal ulcers  Pathology identified H. pylori infection for which she was placed on triple therapy with clarithromycin, metronidazole and amoxicillin. Since her discharge on 10/10, she was getting physical therapy at home and her weakness was gradually improving.  However for few days prior to presentation, patient started to get weak again.  Also reports regular blood bouts of black stools.  Patient is on iron supplement.  On 10/23, patient had an episode of extreme lightheadedness and fall without loss of consciousness.  EMS was called and she was subsequently brought to  ED.  In the ED, patient was afebrile, heart rate in 70s, blood pressure in 100s.  She was extremely weak and looked dry on examination. Lab with WBC normal, hemoglobin 10.2, platelet 114, potassium 3.4, creatinine 1.36 Patient was admitted to hospital service for further evaluation management.  Subjective:  Seen and examined this morning.  Pleasant elderly Caucasian female. Lying on bed.  Not in distress. Renal function better.  Hospital Course:  AKI  -presented with progressive generalized weakness leading to lightheadedness and fall. -And was elevated to 1.36 on admission. Improved with IV hydration.  Recent Labs    06/08/20 1629 06/14/20 1351 06/15/20 0135 06/16/20 0158 06/17/20 0221 07/01/20 2220 07/03/20 0332 07/04/20 0843 07/05/20 0249 07/06/20 0221  BUN 22 33* 29* 16 12 34* 21 13 17 15   CREATININE 1.29* 1.57* 1.15* 0.87 0.90 1.36* 1.05* 0.89 1.21* 0.90   Acute left leg DVT -Patient has bilateral leg swelling right more than left.  Ultrasound duplex 10/25 noted an acute DVT of the less swollen left leg involving the left posterior tibial veins, left peroneal veins, and left gastrocnemius veins -I had a discussion with patient this morning about the risk and benefit of anticoagulation.  She was recently hospitalized for duodenal ulcer and is currently on Protonix twice daily and triple therapy. -Anticoagulation increases her risk of bleeding but as long as he stays on Protonix twice daily the benefits of anticoagulation outweighs the risk.  Patient agrees to proceed with anticoagulation.  Started on Eliquis 10 mg twice daily this morning.  Chronic macrocytic anemia -Hemoglobin stable. -Continue folic acid. -She has black stool for several days and is likely because of  iron supplement.  Hemoglobin remained stable. Recent Labs    06/14/20 1351 06/15/20 0135 07/02/20 0415 07/02/20 0415 07/03/20 0332 07/03/20 0332 07/04/20 0843 07/04/20 0843 07/05/20 0249  07/06/20 0221  HGB 7.7*   < > 9.6*  --  9.1*  --  9.9*  --  9.8* 9.1*  MCV 108.3*   < > 102.4*   < > 104.3*   < > 103.0*   < > 102.3* 103.6*  VITAMINB12 471  --   --   --   --   --   --   --   --   --   FOLATE >100.0  --   --   --   --   --   --   --   --   --   FERRITIN 156  --   --   --   --   --   --   --   --   --   TIBC 300  --   --   --   --   --   --   --   --   --   IRON 62  --   --   --   --   --   --   --   --   --    < > = values in this interval not displayed.   Generalized weakness Lightheadedness, fall -PT evaluation was obtained.  SNF recommended.   Chronic diastolic CHF Essential hypertension Bilateral lower extremity edema -urinalysis without albuminuria -Echocardiogram with EF 60 to 02%, grade 1 diastolic dysfunction, mildly elevated right ventricular pressure. -Home meds include Lopressor 12.5 mg twice daily, Lasix 40 mg daily, Benazepril 20 mg daily, and Aldactone 12.5 mg daily. -Continue Lopressor.  Currently Lasix, Aldactone and benazepril are on hold. -Blood pressure in low normal range, her intake is poor.  I would keep benazepril and Aldactone on hold and resume Lasix as as needed for leg swelling, shortness of breath, weight gain.  CAD(CABG 2014),PAD, [name] - Patient has known history of significant PAD with critical ischemia of the right lower extremity identified during most recent hospitalization from 10/6-10/10. Patient was evaluated by vascular surgery at that time but was recommended that the patient remain on medical management with outpatient follow-up. -No significant cyanosis or pain of the right lower extremity, continue outpatient follow-up. -Continue aspirin 81 mg daily, simvastatin 80 mg daily,  Rheumatoid arthritis -Continue home regimen of Methotrexate weekly, Plaquenil 200 mg twice daily, prednisone 5 mg daily, Zanaflex as needed, Norco as needed, Senokot.  Patient did not require narcotics in the hospital.  Recent history of duodenal  ulcer bleeding/H. pylori infection -Treated the course of triple therapy with amoxicillin, clarithromycin and metronidazole on 10/27. -Continue Protonix twice daily indefinitely because of her being on chronic prednisone and Eliquis. -Continue to follow-up with GI as an outpatient.  Type 2 diabetes mellitus  Diabetic polyneuropathy  -A1c 6.1 on 10/6 -Currently on Lantus dose to 5 units nightly and very sensitive sliding scale insulin. Recent Labs  Lab 07/05/20 0754 07/05/20 1145 07/05/20 1647 07/05/20 2103 07/06/20 0824  GLUCAP 79 104* 285* 136* 108*   Gout -Continue Allopurinol   Wound care: Incision (Closed) 05/30/20 Hip Right (Active)  Date First Assessed/Time First Assessed: 05/30/20 0904   Location: Hip  Location Orientation: Right    Assessments 05/30/2020  9:11 AM 07/05/2020  7:49 PM  Dressing Type -- None  Dressing Clean;Dry;Intact --  Site / Wound Assessment --  Clean;Dry;Pink  Margins -- Attached edges (approximated)  Closure -- None  Drainage Amount -- None  Drainage Description -- No odor     No Linked orders to display     Pressure Injury 07/03/20 Buttocks Right Stage 2 -  Partial thickness loss of dermis presenting as a shallow open injury with a red, pink wound bed without slough. (Active)  Date First Assessed/Time First Assessed: 07/03/20 1300   Location: Buttocks  Location Orientation: Right  Staging: Stage 2 -  Partial thickness loss of dermis presenting as a shallow open injury with a red, pink wound bed without slough.    Assessments 07/03/2020  1:00 PM 07/05/2020  7:49 PM  Dressing Type Foam - Lift dressing to assess site every shift Foam - Lift dressing to assess site every shift  Dressing Changed Clean;Dry;Intact  Site / Wound Assessment Clean;Dry Dressing in place / Unable to assess     No Linked orders to display    Discharge Exam:   Vitals:   07/05/20 0531 07/05/20 1430 07/05/20 2045 07/06/20 0539  BP: 131/87 113/60 (!) 127/50 (!) 119/52   Pulse: 70 73 69 65  Resp: 15  17 17   Temp: 98.2 F (36.8 C) 98.2 F (36.8 C) 98.2 F (36.8 C) (!) 97.5 F (36.4 C)  TempSrc: Oral Oral  Oral  SpO2: 99% 99% 95% 97%  Weight:      Height:        Body mass index is 22.95 kg/m.  General exam: Appears calm and comfortable.  Not in physical distress Skin: No rashes, lesions or ulcers. HEENT: Atraumatic, normocephalic, supple neck, no obvious bleeding Lungs: Clear to auscultation bilaterally CVS: Regular rate and rhythm, no murmur GI/Abd soft, nontender, nondistended, bowel sound present CNS: Alert, awake, oriented x3 Psychiatry: Mood appropriate Extremities: No pedal edema, no calf tenderness  Follow ups:   Discharge Instructions    Diet - low sodium heart healthy   Complete by: As directed    Diet Carb Modified   Complete by: As directed    Increase activity slowly   Complete by: As directed    Leave dressing on - Keep it clean, dry, and intact until clinic visit   Complete by: As directed       Contact information for follow-up providers    Tisovec, Fransico Him, MD Follow up.   Specialty: Internal Medicine Contact information: [place] [address] [phone]        Belva Crome, MD .   Specialty: Cardiology Contact information: [phone] [address] [place] [address] [phone]        Havery Moros, Carlota Raspberry, MD Follow up.   Specialty: Gastroenterology Contact information: [place] [address] [phone]            Contact information for after-discharge care    Destination    [facility] Preferred SNF .   Service: Skilled Nursing Contact information: [place] [phone]                  Recommendations for Outpatient Follow-Up:   1. Follow-up with PCP as an outpatient  Discharge Instructions:  Follow with Primary MD Tisovec, Fransico Him, MD in 7 days    Get CBC/BMP checked in next visit within 1 week by PCP or SNF MD ( we routinely change or add medications that can affect your baseline labs and fluid status, therefore we recommend  that you get the mentioned basic workup next visit with your PCP, your PCP may decide not to get them or add new tests based on their clinical decision)  On your next visit with your PCP, please Get Medicines reviewed and adjusted.  Please request your PCP  to go over all Hospital Tests and Procedure/Radiological results at the follow up, please get all Hospital records sent to your Prim MD by signing hospital release before you go home.  Activity: As tolerated with Full fall precautions use walker/cane & assistance as needed  For Heart failure patients - Check your Weight same time everyday, if you gain over 2 pounds, or you develop in leg swelling, experience more shortness of breath or chest pain, call your Primary MD immediately. Follow Cardiac Low Salt Diet and 1.5 lit/day fluid restriction.  If you have smoked or chewed Tobacco in the last 2 yrs please stop smoking, stop any regular Alcohol  and or any Recreational drug use.  If you experience worsening of your admission symptoms, develop shortness of breath, life threatening emergency, suicidal or homicidal thoughts you must seek medical attention immediately by calling 911 or calling your MD immediately  if symptoms less severe.  You Must read complete instructions/literature along with all the possible adverse reactions/side effects for all the Medicines you take and that have been prescribed to you. Take any new Medicines after you have completely understood and accpet all the possible adverse reactions/side effects.   Do not drive, operate heavy machinery, perform activities at heights, swimming or participation in water activities or provide baby sitting services if your were admitted for syncope or siezures until you have seen by Primary MD or a  Neurologist and advised to do so again.  Do not drive when taking Pain medications.  Do not take more than prescribed Pain, Sleep and Anxiety Medications  Wear Seat belts while driving.   Please note You were cared for by a hospitalist during your hospital stay. If you have any questions about your discharge medications or the care you received while you were in the hospital after you are discharged, you can call the unit and asked to speak with the hospitalist on call if the hospitalist that took care of you is not available. Once you are discharged, your primary care physician will handle any further medical issues. Please note that NO REFILLS for any discharge medications will be authorized once you are discharged, as it is imperative that you return to your primary care physician (or establish a relationship with a primary care physician if you do not have one) for your aftercare needs so that they can reassess your need for medications and monitor your lab values.    Allergies as of 07/06/2020   No Known Allergies     Medication List    STOP taking these medications   amoxicillin 500 MG tablet Commonly known as: AMOXIL   aspirin EC 81 MG tablet   benazepril 20 MG tablet Commonly known as: LOTENSIN   clarithromycin 500 MG tablet Commonly known as: BIAXIN   HYDROcodone-acetaminophen 5-325 MG tablet Commonly known as: NORCO/VICODIN   Lantus SoloStar 100 UNIT/ML Solostar Pen Generic drug: insulin glargine Replaced by: insulin glargine 100 UNIT/ML injection   metroNIDAZOLE 500 MG tablet Commonly known as: FLAGYL   spironolactone 25 MG tablet Commonly known as: ALDACTONE     TAKE these medications   allopurinol 100 MG tablet Commonly known as: ZYLOPRIM Take 200 mg by mouth daily.  apixaban 5 MG Tabs tablet Commonly known as: ELIQUIS 10 mg twice daily for 7 days followed by 5 mg twice daily.   feeding supplement Liqd Take 237 mLs by mouth 2 (two) times daily  between meals. What changed:   when to take this  reasons to take this   folic acid 1 MG tablet Commonly known as: FOLVITE Take 2 mg by mouth daily.   furosemide 40 MG tablet Commonly known as: LASIX Take 1 tablet (40 mg total) by mouth daily as needed for edema. What changed:   when to take this  reasons to take this   hydroxychloroquine 200 MG tablet Commonly known as: PLAQUENIL Take 200 mg by mouth 2 (two) times daily.   insulin aspart 100 UNIT/ML injection Commonly known as: novoLOG Inject 0-5 Units into the skin at bedtime.   insulin aspart 100 UNIT/ML injection Commonly known as: novoLOG Inject 0-6 Units into the skin 3 (three) times daily with meals.   insulin glargine 100 UNIT/ML injection Commonly known as: LANTUS Inject 0.05 mLs (5 Units total) into the skin every evening. Replaces: Lantus SoloStar 100 UNIT/ML Solostar Pen   melatonin 3 MG Tabs tablet Take 1 tablet (3 mg total) by mouth at bedtime.   methotrexate 2.5 MG tablet Commonly known as: RHEUMATREX Caution:Chemotherapy. Protect from light. Please resume your regular dose as before admission to the hospital.   metoprolol tartrate 25 MG tablet Commonly known as: LOPRESSOR Take 0.5 tablets (12.5 mg total) by mouth 2 (two) times daily.   ONE TOUCH ULTRA TEST test strip Generic drug: glucose blood   pantoprazole 40 MG tablet Commonly known as: PROTONIX Take 1 tablet (40 mg total) by mouth 2 (two) times daily. Take twice daily for one month then continue with once daily.   predniSONE 5 MG tablet Commonly known as: DELTASONE Take 5 mg by mouth daily with breakfast.   senna-docusate 8.6-50 MG tablet Commonly known as: Senokot-S Take 1 tablet by mouth daily as needed for mild constipation.   simvastatin 80 MG tablet Commonly known as: ZOCOR Take 80 mg by mouth at bedtime.   tiZANidine 2 MG tablet Commonly known as: ZANAFLEX Take 1-2 tablets (2-4 mg total) by mouth every 6 (six) hours as  needed for muscle spasms.            Discharge Care Instructions  (From admission, onward)         Start     Ordered   07/06/20 0000  Leave dressing on - Keep it clean, dry, and intact until clinic visit        07/06/20 1025          Time coordinating discharge: 35 minutes  The results of significant diagnostics from this hospitalization (including imaging, microbiology, ancillary and laboratory) are listed below for reference.    Procedures and Diagnostic Studies:   DG Chest 1 View  Result Date: 07/02/2020 CLINICAL DATA:  Fall EXAM: CHEST  1 VIEW COMPARISON:  June 14, 2020 FINDINGS: The heart size and mediastinal contours unchanged with mild cardiomegaly. Aortic knob and mitral valve calcifications are seen. Overlying median sternotomy wires are present. Both lungs are clear. The visualized skeletal structures are unremarkable. IMPRESSION: No active disease. Electronically Signed   By: Prudencio Pair M.D.   On: 07/02/2020 00:17   DG Pelvis 1-2 Views  Result Date: 07/02/2020 CLINICAL DATA:  Fall EXAM: PELVIS - 1-2 VIEW COMPARISON:  None. FINDINGS: The patient is status post bilateral total hip arthroplasty. No periprosthetic  lucency or fracture is identified. There are dense vascular calcifications. Diffuse osteopenia seen. Fixation hardware in the lower lumbar spine. IMPRESSION: Status post bilateral total hip arthroplasty without definite acute complication. Electronically Signed   By: Prudencio Pair M.D.   On: 07/02/2020 00:19   CT Lumbar Spine Wo Contrast  Result Date: 07/01/2020 CLINICAL DATA:  Lower back pain, trauma EXAM: CT LUMBAR SPINE WITHOUT CONTRAST TECHNIQUE: Multidetector CT imaging of the lumbar spine was performed without intravenous contrast administration. Multiplanar CT image reconstructions were also generated. COMPARISON:  None. FINDINGS: Segmentation: There are 5 non-rib bearing lumbar type vertebral bodies with the last intervertebral disc space labeled as  L5-S1. Alignment: There is a minimal leftward curvature of the lumbar spine. Vertebrae: There is an age indeterminate, however likely subacute compression fracture at the inferior endplate of L1 with less than 25% loss in height. There is mildly displaced fracture seen through the anteroinferior right-sided endplate. There is buckling of the posteroinferior endplate causing mild canal narrowing. There is diffuse osteopenia. Interbody fixation device is seen at L4-L5. Paraspinal and other soft tissues: The paraspinal soft tissues and visualized retroperitoneal structures are unremarkable. The sacroiliac joints are intact. Scattered dense vascular calcifications are noted. Low-density lesion seen partially exophytic the upper pole scattered colonic diverticula noted Disc levels: Multilevel lumbar spine spondylosis is seen most notable at L5-S1 with facet arthropathy causing severe right and moderate left neural foraminal narrowing IMPRESSION: Age indeterminate, however likely subacute inferior compression fracture of the L1 vertebral body with less than 25% height. There is buckling of the posteroinferior cortex causing mild canal narrowing Electronically Signed   By: Prudencio Pair M.D.   On: 07/01/2020 22:59   ECHOCARDIOGRAM COMPLETE  Result Date: 07/02/2020    ECHOCARDIOGRAM REPORT   Patient Name:   JAVANNA PATIN Date of Exam: 07/02/2020 Medical Rec #:  [id number]      Height:       62.0 in Accession #:    [id number]     Weight:       139.0 lb Date of Birth:  [birth year]      BSA:          1.638 m Patient Age:    85 years       BP:           94/49 mmHg Patient Gender: F              HR:           71 bpm. Exam Location:  Inpatient Procedure: 2D Echo Indications:    abnormal ecg 794.31  History:        Patient has prior history of Echocardiogram examinations, most                 recent 07/26/2019. CAD, Prior CABG, Signs/Symptoms:leg edema;                 Risk Factors:Diabetes and Dyslipidemia.  Sonographer:     Johny Chess Referring Phys: [id number] [place]  1. Left ventricular ejection fraction, by estimation, is 60 to 65%. The left ventricle has normal function. The left ventricle has no regional wall motion abnormalities. Left ventricular diastolic parameters are consistent with Grade I diastolic dysfunction (impaired relaxation).  2. Right ventricular systolic function is normal. The right ventricular size is normal. There is mildly elevated pulmonary artery systolic pressure.  3. Left atrial size was moderately dilated.  4. The mitral valve is degenerative. Moderate mitral valve regurgitation. Severe mitral  annular calcification.  5. Tricuspid valve regurgitation is mild to moderate.  6. The aortic valve is tricuspid. There is mild calcification of the aortic valve. There is mild thickening of the aortic valve. Aortic valve regurgitation is trivial. Mild to moderate aortic valve sclerosis/calcification is present, without any evidence of aortic stenosis.  7. Aortic dilatation noted. There is mild dilatation of the ascending aorta, measuring 40 mm.  8. The inferior vena cava is normal in size with greater than 50% respiratory variability, suggesting right atrial pressure of 3 mmHg. Comparison(s): Changes from prior study are noted. Conclusion(s)/Recommendation(s): Mitral regurgitation now appears more prominent than prior study. FINDINGS  Left Ventricle: Left ventricular ejection fraction, by estimation, is 60 to 65%. The left ventricle has normal function. The left ventricle has no regional wall motion abnormalities. The left ventricular internal cavity size was normal in size. There is  no left ventricular hypertrophy. Left ventricular diastolic parameters are consistent with Grade I diastolic dysfunction (impaired relaxation). Right Ventricle: The right ventricular size is normal. No increase in right ventricular wall thickness. Right ventricular systolic function is normal. There is mildly  elevated pulmonary artery systolic pressure. The tricuspid regurgitant velocity is 3.15  m/s, and with an assumed right atrial pressure of 3 mmHg, the estimated right ventricular systolic pressure is 61.4 mmHg. Left Atrium: Left atrial size was moderately dilated. Right Atrium: Right atrial size was normal in size. Pericardium: There is no evidence of pericardial effusion. Mitral Valve: The mitral valve is degenerative in appearance. There is mild thickening of the mitral valve leaflet(s). There is mild calcification of the mitral valve leaflet(s). Severe mitral annular calcification. Moderate mitral valve regurgitation. MV peak gradient, 13.4 mmHg. The mean mitral valve gradient is 6.0 mmHg. Tricuspid Valve: The tricuspid valve is normal in structure. Tricuspid valve regurgitation is mild to moderate. Aortic Valve: The aortic valve is tricuspid. There is mild calcification of the aortic valve. There is mild thickening of the aortic valve. Aortic valve regurgitation is trivial. Mild to moderate aortic valve sclerosis/calcification is present, without any evidence of aortic stenosis. Pulmonic Valve: The pulmonic valve was not well visualized. Pulmonic valve regurgitation is trivial. Aorta: Aortic dilatation noted. There is mild dilatation of the ascending aorta, measuring 40 mm. Venous: The inferior vena cava is normal in size with greater than 50% respiratory variability, suggesting right atrial pressure of 3 mmHg. IAS/Shunts: The atrial septum is grossly normal.  LEFT VENTRICLE PLAX 2D LVIDd:         3.60 cm  Diastology LVIDs:         2.60 cm  LV e' medial:    5.87 cm/s LV PW:         1.00 cm  LV E/e' medial:  18.7 LV IVS:        1.00 cm  LV e' lateral:   8.81 cm/s LVOT diam:     2.00 cm  LV E/e' lateral: 12.5 LV SV:         75 LV SV Index:   46 LVOT Area:     3.14 cm  RIGHT VENTRICLE             IVC RV S prime:     12.80 cm/s  IVC diam: 1.40 cm TAPSE (M-mode): 1.8 cm LEFT ATRIUM             Index       RIGHT  ATRIUM           Index LA diam:  3.90 cm 2.38 cm/m  RA Area:     12.40 cm LA Vol (A2C):   61.6 ml 37.61 ml/m RA Volume:   26.50 ml  16.18 ml/m LA Vol (A4C):   76.2 ml 46.53 ml/m LA Biplane Vol: 69.6 ml 42.50 ml/m  AORTIC VALVE LVOT Vmax:   93.80 cm/s LVOT Vmean:  60.000 cm/s LVOT VTI:    0.238 m  AORTA Ao Root diam: 3.10 cm Ao Asc diam:  4.00 cm MITRAL VALVE                TRICUSPID VALVE MV Area (PHT): 1.94 cm     TR Peak grad:   39.7 mmHg MV Peak grad:  13.4 mmHg    TR Vmax:        315.00 cm/s MV Mean grad:  6.0 mmHg MV Vmax:       1.83 m/s     SHUNTS MV Vmean:      113.0 cm/s   Systemic VTI:  0.24 m MV Decel Time: 391 msec     Systemic Diam: 2.00 cm MR Peak grad: 119.7 mmHg MR Mean grad: 78.5 mmHg MR Vmax:      547.00 cm/s MR Vmean:     422.0 cm/s MV E velocity: 110.00 cm/s MV A velocity: 153.00 cm/s MV E/A ratio:  0.72 Buford Dresser MD Electronically signed by Buford Dresser MD Signature Date/Time: 07/02/2020/11:36:02 AM    Final    DG FEMUR, MIN 2 VIEWS RIGHT  Result Date: 07/02/2020 CLINICAL DATA:  Fall EXAM: RIGHT FEMUR 2 VIEWS COMPARISON:  None. FINDINGS: The patient is status post right total hip arthroplasty. No periprosthetic lucency or fracture is seen. Overlying subcutaneous edema noted. Dense vascular calcifications are seen. IMPRESSION: Status post right total hip arthroplasty without acute complication. Electronically Signed   By: Prudencio Pair M.D.   On: 07/02/2020 00:20     Labs:   Basic Metabolic Panel: Recent Labs  Lab 07/01/20 2220 07/01/20 2220 07/02/20 1314 07/03/20 0332 07/03/20 0332 07/04/20 0843 07/04/20 0843 07/05/20 0249 07/06/20 0221  NA 139  --   --  142  --  144  --  144 142  K 3.4*   < >  --  2.9*   < > 3.3*   < > 4.0 4.2  CL 105  --   --  109  --  109  --  109 109  CO2 25  --   --  25  --  27  --  26 26  GLUCOSE 206*  --   --  77  --  94  --  87 107*  BUN 34*  --   --  21  --  13  --  17 15  CREATININE 1.36*  --   --  1.05*  --   0.89  --  1.21* 0.90  CALCIUM 8.6*  --   --  8.3*  --  8.6*  --  8.6* 8.3*  MG  --   --  1.9 1.8  --  1.9  --   --   --   PHOS  --   --   --   --   --  2.9  --   --   --    < > = values in this interval not displayed.   GFR Estimated Creatinine Clearance: 44.3 mL/min (by C-G formula based on SCr of 0.9 mg/dL). Liver Function Tests: Recent Labs  Lab 07/02/20 1314 07/03/20 0332  AST 69*  58*  ALT 44 39  ALKPHOS 39 32*  BILITOT 0.6 0.7  PROT 5.2* 4.3*  ALBUMIN 2.9* 2.4*   No results for input(s): LIPASE, AMYLASE in the last 168 hours. No results for input(s): AMMONIA in the last 168 hours. Coagulation profile No results for input(s): INR, PROTIME in the last 168 hours.  CBC: Recent Labs  Lab 07/01/20 2220 07/01/20 2220 07/02/20 0415 07/03/20 0332 07/04/20 0843 07/05/20 0249 07/06/20 0221  WBC 10.1   < > 9.2 9.8 9.0 9.8 8.9  NEUTROABS 8.6*  --   --  7.9* 7.2 7.8* 6.9  HGB 10.2*   < > 9.6* 9.1* 9.9* 9.8* 9.1*  HCT 31.8*   < > 29.8* 28.8* 30.9* 31.1* 28.8*  MCV 102.6*   < > 102.4* 104.3* 103.0* 102.3* 103.6*  PLT 114*   < > 121* 140* 147* 149* 156   < > = values in this interval not displayed.   Cardiac Enzymes: No results for input(s): CKTOTAL, CKMB, CKMBINDEX, TROPONINI in the last 168 hours. BNP: Invalid input(s): POCBNP CBG: Recent Labs  Lab 07/05/20 0754 07/05/20 1145 07/05/20 1647 07/05/20 2103 07/06/20 0824  GLUCAP 79 104* 285* 136* 108*   D-Dimer No results for input(s): DDIMER in the last 72 hours. Hgb A1c No results for input(s): HGBA1C in the last 72 hours. Lipid Profile No results for input(s): CHOL, HDL, LDLCALC, TRIG, CHOLHDL, LDLDIRECT in the last 72 hours. Thyroid function studies No results for input(s): TSH, T4TOTAL, T3FREE, THYROIDAB in the last 72 hours.  Invalid input(s): FREET3 Anemia work up No results for input(s): VITAMINB12, FOLATE, FERRITIN, TIBC, IRON, RETICCTPCT in the last 72 hours. Microbiology Recent Results (from the past  240 hour(s))  Respiratory Panel by RT PCR (Flu A&B, Covid) - Nasopharyngeal Swab     Status: None   Collection Time: 07/02/20  2:34 AM   Specimen: Nasopharyngeal Swab  Result Value Ref Range Status   SARS Coronavirus 2 by RT PCR NEGATIVE NEGATIVE Final    Comment: (NOTE) SARS-CoV-2 target nucleic acids are NOT DETECTED.  The SARS-CoV-2 RNA is generally detectable in upper respiratoy specimens during the acute phase of infection. The lowest concentration of SARS-CoV-2 viral copies this assay can detect is 131 copies/mL. A negative result does not preclude SARS-Cov-2 infection and should not be used as the sole basis for treatment or other patient management decisions. A negative result may occur with  improper specimen collection/handling, submission of specimen other than nasopharyngeal swab, presence of viral mutation(s) within the areas targeted by this assay, and inadequate number of viral copies (<131 copies/mL). A negative result must be combined with clinical observations, patient history, and epidemiological information. The expected result is Negative.  Fact Sheet for Patients:  PinkCheek.be  Fact Sheet for Healthcare Providers:  GravelBags.it  This test is no t yet approved or cleared by the [place] FDA and  has been authorized for detection and/or diagnosis of SARS-CoV-2 by FDA under an Emergency Use Authorization (EUA). This EUA will remain  in effect (meaning this test can be used) for the duration of the COVID-19 declaration under Section 564(b)(1) of the Act, 21 U.S.C. section 360bbb-3(b)(1), unless the authorization is terminated or revoked sooner.     Influenza A by PCR NEGATIVE NEGATIVE Final   Influenza B by PCR NEGATIVE NEGATIVE Final    Comment: (NOTE) The Xpert Xpress SARS-CoV-2/FLU/RSV assay is intended as an aid in  the diagnosis of influenza from Nasopharyngeal swab specimens and  should  not  be used as a sole basis for treatment. Nasal washings and  aspirates are unacceptable for Xpert Xpress SARS-CoV-2/FLU/RSV  testing.  Fact Sheet for Patients: PinkCheek.be  Fact Sheet for Healthcare Providers: GravelBags.it  This test is not yet approved or cleared by the [place] FDA and  has been authorized for detection and/or diagnosis of SARS-CoV-2 by  FDA under an Emergency Use Authorization (EUA). This EUA will remain  in effect (meaning this test can be used) for the duration of the  Covid-19 declaration under Section 564(b)(1) of the Act, 21  U.S.C. section 360bbb-3(b)(1), unless the authorization is  terminated or revoked. Performed at [facility], [place] [address] [place], [place] [address]   Culture, Urine     Status: Abnormal   Collection Time: 07/02/20  2:49 AM   Specimen: Urine, Random  Result Value Ref Range Status   Specimen Description URINE, RANDOM  Final   Special Requests   Final    NONE Performed at [facility], [place] [address] [place], [place] [address]    Culture MULTIPLE SPECIES PRESENT, SUGGEST RECOLLECTION (A)  Final   Report Status 07/03/2020 FINAL  Final  SARS Coronavirus 2 by RT PCR (hospital order, performed in [facility] hospital lab) Nasopharyngeal Nasopharyngeal Swab     Status: None   Collection Time: 07/06/20  5:46 AM   Specimen: Nasopharyngeal Swab  Result Value Ref Range Status   SARS Coronavirus 2 NEGATIVE NEGATIVE Final    Comment: (NOTE) SARS-CoV-2 target nucleic acids are NOT DETECTED.  The SARS-CoV-2 RNA is generally detectable in upper and lower respiratory specimens during the acute phase of infection. The lowest concentration of SARS-CoV-2 viral copies this assay can detect is 250 copies / mL. A negative result does not preclude SARS-CoV-2 infection and should not be used as the sole basis for treatment or other patient management decisions.  A  negative result may occur with improper specimen collection / handling, submission of specimen other than nasopharyngeal swab, presence of viral mutation(s) within the areas targeted by this assay, and inadequate number of viral copies (<250 copies / mL). A negative result must be combined with clinical observations, patient history, and epidemiological information.  Fact Sheet for Patients:   StrictlyIdeas.no  Fact Sheet for Healthcare Providers: BankingDealers.co.za  This test is not yet approved or  cleared by the [place] FDA and has been authorized for detection and/or diagnosis of SARS-CoV-2 by FDA under an Emergency Use Authorization (EUA).  This EUA will remain in effect (meaning this test can be used) for the duration of the COVID-19 declaration under Section 564(b)(1) of the Act, 21 U.S.C. section 360bbb-3(b)(1), unless the authorization is terminated or revoked sooner.  Performed at [facility], [place] [address], [place], [place] [address]      Signed: Terrilee Croak  [facility] 07/06/2020, 10:26 AM

## 2020-07-06 NOTE — TOC Transition Note (Signed)
Transition of Care [facility]) - CM/SW Discharge Note   Patient Details  Name: ALEGRA ROST MRN: [id number] Date of Birth: Nov 04, 1934  [facility]) CM/SW Contact:  Bethann Berkshire, [place] Phone Number: 07/06/2020, 11:05 AM   Clinical Narrative:     Patient will DC to: [facility] [place] Anticipated DC date: 07/06/20 Family notified: Maia Plan son Transport by: Corey Harold   Per MD patient ready for DC to [facility] . RN, patient, patient's family, and facility notified of DC. Discharge Summary and FL2 sent to facility. RN to call report prior to discharge (Room 204  [phone]). DC packet on chart. Ambulance transport requested for patient.   CSW will sign off for now as social work intervention is no longer needed. Please consult [place] again if new needs arise.   Final next level of care: Skilled Nursing Facility Barriers to Discharge: No Barriers Identified   Patient Goals and CMS Choice Patient states their goals for this hospitalization and ongoing recovery are:: [facility] CMS Medicare.gov Compare Post Acute Care list provided to:: Patient Choice offered to / list presented to : Patient  Discharge Placement              Patient chooses bed at: [facility] [place] Patient to be transferred to facility by: )[place] Name of family member notified: Aldora Perman son Patient and family notified of of transfer: 07/06/20  Discharge Plan and Services                                     Social Determinants of Health (SDOH) Interventions     Readmission Risk Interventions No flowsheet data found.

## 2020-07-11 ENCOUNTER — Other Ambulatory Visit: Payer: Self-pay

## 2020-07-11 ENCOUNTER — Inpatient Hospital Stay (HOSPITAL_COMMUNITY)
Admission: EM | Admit: 2020-07-11 | Discharge: 2020-08-09 | DRG: 557 | Disposition: E | Payer: Medicare Other | Attending: Internal Medicine | Admitting: Internal Medicine

## 2020-07-11 ENCOUNTER — Encounter (HOSPITAL_COMMUNITY): Payer: Self-pay

## 2020-07-11 DIAGNOSIS — K2970 Gastritis, unspecified, without bleeding: Secondary | ICD-10-CM | POA: Diagnosis present

## 2020-07-11 DIAGNOSIS — Z7189 Other specified counseling: Secondary | ICD-10-CM | POA: Diagnosis not present

## 2020-07-11 DIAGNOSIS — R339 Retention of urine, unspecified: Secondary | ICD-10-CM | POA: Diagnosis present

## 2020-07-11 DIAGNOSIS — E1142 Type 2 diabetes mellitus with diabetic polyneuropathy: Secondary | ICD-10-CM | POA: Diagnosis present

## 2020-07-11 DIAGNOSIS — K7200 Acute and subacute hepatic failure without coma: Secondary | ICD-10-CM | POA: Diagnosis present

## 2020-07-11 DIAGNOSIS — M069 Rheumatoid arthritis, unspecified: Secondary | ICD-10-CM | POA: Diagnosis present

## 2020-07-11 DIAGNOSIS — I5032 Chronic diastolic (congestive) heart failure: Secondary | ICD-10-CM

## 2020-07-11 DIAGNOSIS — K5900 Constipation, unspecified: Secondary | ICD-10-CM

## 2020-07-11 DIAGNOSIS — R7989 Other specified abnormal findings of blood chemistry: Secondary | ICD-10-CM | POA: Diagnosis present

## 2020-07-11 DIAGNOSIS — D62 Acute posthemorrhagic anemia: Secondary | ICD-10-CM | POA: Diagnosis present

## 2020-07-11 DIAGNOSIS — I10 Essential (primary) hypertension: Secondary | ICD-10-CM | POA: Diagnosis not present

## 2020-07-11 DIAGNOSIS — N182 Chronic kidney disease, stage 2 (mild): Secondary | ICD-10-CM | POA: Diagnosis present

## 2020-07-11 DIAGNOSIS — I82442 Acute embolism and thrombosis of left tibial vein: Secondary | ICD-10-CM | POA: Diagnosis present

## 2020-07-11 DIAGNOSIS — K264 Chronic or unspecified duodenal ulcer with hemorrhage: Secondary | ICD-10-CM | POA: Diagnosis present

## 2020-07-11 DIAGNOSIS — D696 Thrombocytopenia, unspecified: Secondary | ICD-10-CM | POA: Diagnosis present

## 2020-07-11 DIAGNOSIS — I130 Hypertensive heart and chronic kidney disease with heart failure and stage 1 through stage 4 chronic kidney disease, or unspecified chronic kidney disease: Secondary | ICD-10-CM | POA: Diagnosis present

## 2020-07-11 DIAGNOSIS — R29898 Other symptoms and signs involving the musculoskeletal system: Secondary | ICD-10-CM | POA: Diagnosis not present

## 2020-07-11 DIAGNOSIS — Z79899 Other long term (current) drug therapy: Secondary | ICD-10-CM

## 2020-07-11 DIAGNOSIS — Z841 Family history of disorders of kidney and ureter: Secondary | ICD-10-CM

## 2020-07-11 DIAGNOSIS — E870 Hyperosmolality and hypernatremia: Secondary | ICD-10-CM | POA: Diagnosis present

## 2020-07-11 DIAGNOSIS — I2510 Atherosclerotic heart disease of native coronary artery without angina pectoris: Secondary | ICD-10-CM | POA: Diagnosis present

## 2020-07-11 DIAGNOSIS — Z794 Long term (current) use of insulin: Secondary | ICD-10-CM | POA: Diagnosis not present

## 2020-07-11 DIAGNOSIS — F419 Anxiety disorder, unspecified: Secondary | ICD-10-CM | POA: Diagnosis present

## 2020-07-11 DIAGNOSIS — T380X5A Adverse effect of glucocorticoids and synthetic analogues, initial encounter: Secondary | ICD-10-CM | POA: Diagnosis present

## 2020-07-11 DIAGNOSIS — R531 Weakness: Secondary | ICD-10-CM | POA: Diagnosis not present

## 2020-07-11 DIAGNOSIS — I82409 Acute embolism and thrombosis of unspecified deep veins of unspecified lower extremity: Secondary | ICD-10-CM | POA: Diagnosis not present

## 2020-07-11 DIAGNOSIS — Z9842 Cataract extraction status, left eye: Secondary | ICD-10-CM

## 2020-07-11 DIAGNOSIS — E440 Moderate protein-calorie malnutrition: Secondary | ICD-10-CM | POA: Diagnosis present

## 2020-07-11 DIAGNOSIS — Z20822 Contact with and (suspected) exposure to covid-19: Secondary | ICD-10-CM | POA: Diagnosis present

## 2020-07-11 DIAGNOSIS — D692 Other nonthrombocytopenic purpura: Secondary | ICD-10-CM | POA: Diagnosis present

## 2020-07-11 DIAGNOSIS — Z7401 Bed confinement status: Secondary | ICD-10-CM

## 2020-07-11 DIAGNOSIS — J690 Pneumonitis due to inhalation of food and vomit: Secondary | ICD-10-CM | POA: Diagnosis not present

## 2020-07-11 DIAGNOSIS — R54 Age-related physical debility: Secondary | ICD-10-CM | POA: Diagnosis present

## 2020-07-11 DIAGNOSIS — R140 Abdominal distension (gaseous): Secondary | ICD-10-CM | POA: Diagnosis present

## 2020-07-11 DIAGNOSIS — M1990 Unspecified osteoarthritis, unspecified site: Secondary | ICD-10-CM | POA: Diagnosis present

## 2020-07-11 DIAGNOSIS — Z515 Encounter for palliative care: Secondary | ICD-10-CM

## 2020-07-11 DIAGNOSIS — Z86010 Personal history of colonic polyps: Secondary | ICD-10-CM

## 2020-07-11 DIAGNOSIS — R748 Abnormal levels of other serum enzymes: Secondary | ICD-10-CM | POA: Diagnosis present

## 2020-07-11 DIAGNOSIS — I739 Peripheral vascular disease, unspecified: Secondary | ICD-10-CM | POA: Diagnosis not present

## 2020-07-11 DIAGNOSIS — E1151 Type 2 diabetes mellitus with diabetic peripheral angiopathy without gangrene: Secondary | ICD-10-CM | POA: Diagnosis present

## 2020-07-11 DIAGNOSIS — R0602 Shortness of breath: Secondary | ICD-10-CM

## 2020-07-11 DIAGNOSIS — E139 Other specified diabetes mellitus without complications: Secondary | ICD-10-CM | POA: Diagnosis not present

## 2020-07-11 DIAGNOSIS — M609 Myositis, unspecified: Secondary | ICD-10-CM | POA: Diagnosis present

## 2020-07-11 DIAGNOSIS — M109 Gout, unspecified: Secondary | ICD-10-CM | POA: Diagnosis present

## 2020-07-11 DIAGNOSIS — K5901 Slow transit constipation: Secondary | ICD-10-CM | POA: Diagnosis not present

## 2020-07-11 DIAGNOSIS — M79605 Pain in left leg: Secondary | ICD-10-CM | POA: Diagnosis present

## 2020-07-11 DIAGNOSIS — K719 Toxic liver disease, unspecified: Secondary | ICD-10-CM | POA: Diagnosis present

## 2020-07-11 DIAGNOSIS — E1122 Type 2 diabetes mellitus with diabetic chronic kidney disease: Secondary | ICD-10-CM | POA: Diagnosis present

## 2020-07-11 DIAGNOSIS — Z8781 Personal history of (healed) traumatic fracture: Secondary | ICD-10-CM

## 2020-07-11 DIAGNOSIS — Z9841 Cataract extraction status, right eye: Secondary | ICD-10-CM

## 2020-07-11 DIAGNOSIS — M6282 Rhabdomyolysis: Principal | ICD-10-CM | POA: Diagnosis present

## 2020-07-11 DIAGNOSIS — K5904 Chronic idiopathic constipation: Secondary | ICD-10-CM | POA: Diagnosis not present

## 2020-07-11 DIAGNOSIS — G8929 Other chronic pain: Secondary | ICD-10-CM | POA: Diagnosis present

## 2020-07-11 DIAGNOSIS — I088 Other rheumatic multiple valve diseases: Secondary | ICD-10-CM | POA: Diagnosis present

## 2020-07-11 DIAGNOSIS — M6089 Other myositis, multiple sites: Secondary | ICD-10-CM

## 2020-07-11 DIAGNOSIS — E782 Mixed hyperlipidemia: Secondary | ICD-10-CM | POA: Diagnosis present

## 2020-07-11 DIAGNOSIS — Z90710 Acquired absence of both cervix and uterus: Secondary | ICD-10-CM

## 2020-07-11 DIAGNOSIS — Z8249 Family history of ischemic heart disease and other diseases of the circulatory system: Secondary | ICD-10-CM

## 2020-07-11 DIAGNOSIS — I25810 Atherosclerosis of coronary artery bypass graft(s) without angina pectoris: Secondary | ICD-10-CM | POA: Diagnosis present

## 2020-07-11 DIAGNOSIS — Z96643 Presence of artificial hip joint, bilateral: Secondary | ICD-10-CM | POA: Diagnosis present

## 2020-07-11 DIAGNOSIS — Z8619 Personal history of other infectious and parasitic diseases: Secondary | ICD-10-CM

## 2020-07-11 DIAGNOSIS — E876 Hypokalemia: Secondary | ICD-10-CM | POA: Diagnosis present

## 2020-07-11 DIAGNOSIS — Z66 Do not resuscitate: Secondary | ICD-10-CM | POA: Diagnosis present

## 2020-07-11 DIAGNOSIS — K5909 Other constipation: Secondary | ICD-10-CM | POA: Diagnosis not present

## 2020-07-11 DIAGNOSIS — D539 Nutritional anemia, unspecified: Secondary | ICD-10-CM | POA: Diagnosis present

## 2020-07-11 DIAGNOSIS — K219 Gastro-esophageal reflux disease without esophagitis: Secondary | ICD-10-CM | POA: Diagnosis present

## 2020-07-11 DIAGNOSIS — Z6823 Body mass index (BMI) 23.0-23.9, adult: Secondary | ICD-10-CM

## 2020-07-11 DIAGNOSIS — R0609 Other forms of dyspnea: Secondary | ICD-10-CM | POA: Diagnosis not present

## 2020-07-11 DIAGNOSIS — E860 Dehydration: Secondary | ICD-10-CM | POA: Diagnosis present

## 2020-07-11 DIAGNOSIS — J9601 Acute respiratory failure with hypoxia: Secondary | ICD-10-CM | POA: Diagnosis present

## 2020-07-11 DIAGNOSIS — R52 Pain, unspecified: Secondary | ICD-10-CM | POA: Diagnosis not present

## 2020-07-11 DIAGNOSIS — I5033 Acute on chronic diastolic (congestive) heart failure: Secondary | ICD-10-CM | POA: Diagnosis present

## 2020-07-11 DIAGNOSIS — Z833 Family history of diabetes mellitus: Secondary | ICD-10-CM

## 2020-07-11 DIAGNOSIS — Z7901 Long term (current) use of anticoagulants: Secondary | ICD-10-CM

## 2020-07-11 DIAGNOSIS — R0600 Dyspnea, unspecified: Secondary | ICD-10-CM | POA: Diagnosis not present

## 2020-07-11 DIAGNOSIS — K648 Other hemorrhoids: Secondary | ICD-10-CM | POA: Diagnosis present

## 2020-07-11 DIAGNOSIS — D689 Coagulation defect, unspecified: Secondary | ICD-10-CM | POA: Diagnosis present

## 2020-07-11 DIAGNOSIS — E1165 Type 2 diabetes mellitus with hyperglycemia: Secondary | ICD-10-CM | POA: Diagnosis present

## 2020-07-11 DIAGNOSIS — Z961 Presence of intraocular lens: Secondary | ICD-10-CM | POA: Diagnosis present

## 2020-07-11 DIAGNOSIS — Z87440 Personal history of urinary (tract) infections: Secondary | ICD-10-CM

## 2020-07-11 DIAGNOSIS — Z87891 Personal history of nicotine dependence: Secondary | ICD-10-CM

## 2020-07-11 DIAGNOSIS — Z86718 Personal history of other venous thrombosis and embolism: Secondary | ICD-10-CM

## 2020-07-11 DIAGNOSIS — K658 Other peritonitis: Secondary | ICD-10-CM | POA: Diagnosis present

## 2020-07-11 DIAGNOSIS — F32A Depression, unspecified: Secondary | ICD-10-CM | POA: Diagnosis present

## 2020-07-11 DIAGNOSIS — R945 Abnormal results of liver function studies: Secondary | ICD-10-CM | POA: Diagnosis not present

## 2020-07-11 DIAGNOSIS — Z7952 Long term (current) use of systemic steroids: Secondary | ICD-10-CM

## 2020-07-11 LAB — CBC
HCT: 28 % — ABNORMAL LOW (ref 36.0–46.0)
Hemoglobin: 8.8 g/dL — ABNORMAL LOW (ref 12.0–15.0)
MCH: 33.1 pg (ref 26.0–34.0)
MCHC: 31.4 g/dL (ref 30.0–36.0)
MCV: 105.3 fL — ABNORMAL HIGH (ref 80.0–100.0)
Platelets: 211 10*3/uL (ref 150–400)
RBC: 2.66 MIL/uL — ABNORMAL LOW (ref 3.87–5.11)
RDW: 18.7 % — ABNORMAL HIGH (ref 11.5–15.5)
WBC: 15.5 10*3/uL — ABNORMAL HIGH (ref 4.0–10.5)
nRBC: 0.1 % (ref 0.0–0.2)

## 2020-07-11 LAB — COMPREHENSIVE METABOLIC PANEL
ALT: 293 U/L — ABNORMAL HIGH (ref 0–44)
AST: 609 U/L — ABNORMAL HIGH (ref 15–41)
Albumin: 2 g/dL — ABNORMAL LOW (ref 3.5–5.0)
Alkaline Phosphatase: 47 U/L (ref 38–126)
Anion gap: 9 (ref 5–15)
BUN: 17 mg/dL (ref 8–23)
CO2: 22 mmol/L (ref 22–32)
Calcium: 8.1 mg/dL — ABNORMAL LOW (ref 8.9–10.3)
Chloride: 104 mmol/L (ref 98–111)
Creatinine, Ser: 0.62 mg/dL (ref 0.44–1.00)
GFR, Estimated: >60 mL/min (ref >60)
Glucose, Bld: 120 mg/dL — ABNORMAL HIGH (ref 70–99)
Potassium: 4.5 mmol/L (ref 3.5–5.1)
Sodium: 135 mmol/L (ref 135–145)
Total Bilirubin: 0.8 mg/dL (ref 0.3–1.2)
Total Protein: 4.4 g/dL — ABNORMAL LOW (ref 6.5–8.1)

## 2020-07-11 LAB — BASIC METABOLIC PANEL
Anion gap: 9 (ref 5–15)
Anion gap: 9 (ref 5–15)
BUN: 23 mg/dL (ref 8–23)
BUN: 21 mg/dL (ref 8–23)
CO2: 26 mmol/L (ref 22–32)
CO2: 25 mmol/L (ref 22–32)
Calcium: 8.4 mg/dL — ABNORMAL LOW (ref 8.9–10.3)
Calcium: 8.3 mg/dL — ABNORMAL LOW (ref 8.9–10.3)
Chloride: 106 mmol/L (ref 98–111)
Chloride: 106 mmol/L (ref 98–111)
Creatinine, Ser: 0.65 mg/dL (ref 0.44–1.00)
Creatinine, Ser: 0.66 mg/dL (ref 0.44–1.00)
GFR, Estimated: >60 mL/min (ref >60)
GFR, Estimated: >60 mL/min (ref >60)
Glucose, Bld: 106 mg/dL — ABNORMAL HIGH (ref 70–99)
Glucose, Bld: 99 mg/dL (ref 70–99)
Potassium: 4 mmol/L (ref 3.5–5.1)
Potassium: 4 mmol/L (ref 3.5–5.1)
Sodium: 141 mmol/L (ref 135–145)
Sodium: 140 mmol/L (ref 135–145)

## 2020-07-11 LAB — CK
Total CK: 11964 U/L — ABNORMAL HIGH (ref 38–234)
Total CK: 15891 U/L — ABNORMAL HIGH (ref 38–234)
Total CK: 17287 U/L — ABNORMAL HIGH (ref 38–234)

## 2020-07-11 LAB — HEPATIC FUNCTION PANEL
ALT: 252 U/L — ABNORMAL HIGH (ref 0–44)
AST: 525 U/L — ABNORMAL HIGH (ref 15–41)
Albumin: 2.1 g/dL — ABNORMAL LOW (ref 3.5–5.0)
Alkaline Phosphatase: 46 U/L (ref 38–126)
Bilirubin, Direct: 0.2 mg/dL (ref 0.0–0.2)
Indirect Bilirubin: 0.3 mg/dL (ref 0.3–0.9)
Total Bilirubin: 0.5 mg/dL (ref 0.3–1.2)
Total Protein: 4.6 g/dL — ABNORMAL LOW (ref 6.5–8.1)

## 2020-07-11 LAB — RESPIRATORY PANEL BY RT PCR (FLU A&B, COVID)
Influenza A by PCR: NEGATIVE
Influenza B by PCR: NEGATIVE
SARS Coronavirus 2 by RT PCR: NEGATIVE

## 2020-07-11 LAB — CBG MONITORING, ED
Glucose-Capillary: 88 mg/dL (ref 70–99)
Glucose-Capillary: 121 mg/dL — ABNORMAL HIGH (ref 70–99)
Glucose-Capillary: 133 mg/dL — ABNORMAL HIGH (ref 70–99)

## 2020-07-11 LAB — T4, FREE: Free T4: 1.25 ng/dL — ABNORMAL HIGH (ref 0.61–1.12)

## 2020-07-11 LAB — MAGNESIUM: Magnesium: 2.2 mg/dL (ref 1.7–2.4)

## 2020-07-11 LAB — TSH: TSH: 6.114 u[IU]/mL — ABNORMAL HIGH (ref 0.350–4.500)

## 2020-07-11 LAB — LACTATE DEHYDROGENASE: LDH: 730 U/L — ABNORMAL HIGH (ref 98–192)

## 2020-07-11 MED ORDER — APIXABAN 5 MG PO TABS
5.0000 mg | ORAL_TABLET | Freq: Two times a day (BID) | ORAL | Status: DC
  Administered 2020-07-11 – 2020-07-12 (×3): 5 mg via ORAL
  Filled 2020-07-11 (×3): qty 1

## 2020-07-11 MED ORDER — FOLIC ACID 1 MG PO TABS
2.0000 mg | ORAL_TABLET | Freq: Every day | ORAL | Status: DC
  Administered 2020-07-11 – 2020-07-15 (×5): 2 mg via ORAL
  Filled 2020-07-11 (×5): qty 2

## 2020-07-11 MED ORDER — INSULIN ASPART 100 UNIT/ML ~~LOC~~ SOLN
0.0000 [IU] | Freq: Three times a day (TID) | SUBCUTANEOUS | Status: DC
  Administered 2020-07-12: 5 [IU] via SUBCUTANEOUS
  Administered 2020-07-12: 1 [IU] via SUBCUTANEOUS
  Administered 2020-07-12: 2 [IU] via SUBCUTANEOUS
  Administered 2020-07-13: 1 [IU] via SUBCUTANEOUS
  Administered 2020-07-13 – 2020-07-16 (×7): 2 [IU] via SUBCUTANEOUS

## 2020-07-11 MED ORDER — MELATONIN 3 MG PO TABS
3.0000 mg | ORAL_TABLET | Freq: Every day | ORAL | Status: DC
  Administered 2020-07-12 – 2020-07-14 (×3): 3 mg via ORAL
  Filled 2020-07-11 (×3): qty 1

## 2020-07-11 MED ORDER — ONDANSETRON HCL 4 MG/2ML IJ SOLN: 4.0000 mg | Freq: Four times a day (QID) | INTRAMUSCULAR | Status: DC | PRN

## 2020-07-11 MED ORDER — ALLOPURINOL 100 MG PO TABS
200.0000 mg | ORAL_TABLET | Freq: Every day | ORAL | Status: DC
  Administered 2020-07-11 – 2020-07-15 (×5): 200 mg via ORAL
  Filled 2020-07-11 (×5): qty 2

## 2020-07-11 MED ORDER — METOCLOPRAMIDE HCL 5 MG PO TABS
5.0000 mg | ORAL_TABLET | Freq: Every day | ORAL | Status: DC
  Administered 2020-07-11 – 2020-07-14 (×4): 5 mg via ORAL
  Filled 2020-07-11 (×4): qty 1

## 2020-07-11 MED ORDER — ACETAMINOPHEN 325 MG PO TABS
650.0000 mg | ORAL_TABLET | Freq: Four times a day (QID) | ORAL | Status: DC | PRN
  Administered 2020-07-14: 650 mg via ORAL
  Filled 2020-07-11: qty 2

## 2020-07-11 MED ORDER — MORPHINE SULFATE (PF) 2 MG/ML IV SOLN
2.0000 mg | INTRAVENOUS | Status: DC | PRN
  Administered 2020-07-15 – 2020-07-20 (×12): 2 mg via INTRAVENOUS
  Filled 2020-07-11 (×13): qty 1

## 2020-07-11 MED ORDER — PREDNISONE 5 MG PO TABS: 5.0000 mg | ORAL_TABLET | Freq: Every day | ORAL | Status: DC

## 2020-07-11 MED ORDER — TIZANIDINE HCL 4 MG PO TABS
2.0000 mg | ORAL_TABLET | Freq: Four times a day (QID) | ORAL | Status: DC | PRN
  Administered 2020-07-13: 2 mg via ORAL
  Filled 2020-07-11: qty 1

## 2020-07-11 MED ORDER — HYDROCORTISONE NA SUCCINATE PF 100 MG IJ SOLR
50.0000 mg | Freq: Four times a day (QID) | INTRAMUSCULAR | Status: DC
  Administered 2020-07-11 – 2020-07-12 (×6): 50 mg via INTRAVENOUS
  Filled 2020-07-11 (×2): qty 2
  Filled 2020-07-11 (×6): qty 1

## 2020-07-11 MED ORDER — LACTATED RINGERS IV SOLN: INTRAVENOUS | Status: DC

## 2020-07-11 MED ORDER — SENNOSIDES-DOCUSATE SODIUM 8.6-50 MG PO TABS: 1.0000 | ORAL_TABLET | Freq: Every day | ORAL | Status: DC | PRN

## 2020-07-11 MED ORDER — HYDROCODONE-ACETAMINOPHEN 5-325 MG PO TABS
1.0000 | ORAL_TABLET | ORAL | Status: DC | PRN
  Administered 2020-07-11: 1 via ORAL
  Administered 2020-07-11 (×2): 2 via ORAL
  Administered 2020-07-12: 1 via ORAL
  Filled 2020-07-11: qty 1
  Filled 2020-07-11 (×2): qty 2
  Filled 2020-07-11: qty 1

## 2020-07-11 MED ORDER — ACETAMINOPHEN 650 MG RE SUPP: 650.0000 mg | Freq: Four times a day (QID) | RECTAL | Status: DC | PRN

## 2020-07-11 MED ORDER — LACTATED RINGERS IV BOLUS
1000.0000 mL | Freq: Once | INTRAVENOUS | Status: AC
  Administered 2020-07-11: 1000 mL via INTRAVENOUS

## 2020-07-11 MED ORDER — POLYETHYLENE GLYCOL 3350 17 G PO PACK: 17.0000 g | PACK | Freq: Every day | ORAL | Status: DC | PRN

## 2020-07-11 MED ORDER — BISACODYL 5 MG PO TBEC: 5.0000 mg | DELAYED_RELEASE_TABLET | Freq: Every day | ORAL | Status: DC | PRN

## 2020-07-11 MED ORDER — HYDRALAZINE HCL 20 MG/ML IJ SOLN: 5.0000 mg | INTRAMUSCULAR | Status: DC | PRN

## 2020-07-11 MED ORDER — INSULIN GLARGINE 100 UNIT/ML ~~LOC~~ SOLN
10.0000 [IU] | Freq: Every evening | SUBCUTANEOUS | Status: DC
  Administered 2020-07-12 – 2020-07-14 (×3): 10 [IU] via SUBCUTANEOUS
  Filled 2020-07-11 (×6): qty 0.1

## 2020-07-11 MED ORDER — ONDANSETRON HCL 4 MG PO TABS: 4.0000 mg | ORAL_TABLET | Freq: Four times a day (QID) | ORAL | Status: DC | PRN

## 2020-07-11 MED ORDER — IOHEXOL 9 MG/ML PO SOLN
ORAL | Status: AC
  Filled 2020-07-11: qty 1000

## 2020-07-11 MED ORDER — DOCUSATE SODIUM 100 MG PO CAPS
100.0000 mg | ORAL_CAPSULE | Freq: Two times a day (BID) | ORAL | Status: DC
  Administered 2020-07-11 – 2020-07-13 (×5): 100 mg via ORAL
  Filled 2020-07-11 (×7): qty 1

## 2020-07-11 MED ORDER — IOHEXOL 9 MG/ML PO SOLN
500.0000 mL | ORAL | Status: AC
  Administered 2020-07-12: 500 mL via ORAL

## 2020-07-11 MED ORDER — APIXABAN 5 MG PO TABS: 10.0000 mg | ORAL_TABLET | Freq: Two times a day (BID) | ORAL | Status: DC

## 2020-07-11 MED ORDER — PANTOPRAZOLE SODIUM 40 MG PO TBEC
40.0000 mg | DELAYED_RELEASE_TABLET | Freq: Two times a day (BID) | ORAL | Status: DC
  Administered 2020-07-11 – 2020-07-12 (×3): 40 mg via ORAL
  Filled 2020-07-11 (×3): qty 1

## 2020-07-11 MED ORDER — METOPROLOL TARTRATE 12.5 MG HALF TABLET
12.5000 mg | ORAL_TABLET | Freq: Two times a day (BID) | ORAL | Status: DC
  Administered 2020-07-11 – 2020-07-15 (×8): 12.5 mg via ORAL
  Filled 2020-07-11 (×9): qty 1

## 2020-07-11 MED ORDER — ZINC OXIDE 40 % EX OINT
1.0000 "application " | TOPICAL_OINTMENT | Freq: Three times a day (TID) | CUTANEOUS | Status: DC
  Administered 2020-07-12 – 2020-07-28 (×38): 1 via TOPICAL
  Filled 2020-07-11 (×2): qty 57

## 2020-07-11 NOTE — ED Notes (Signed)
Lunch Tray Ordered @ 1009. 

## 2020-07-11 NOTE — H&P (Signed)
History and Physical    Patricia Villegas PNT:[id number] DOB: 1934-12-12 DOA: 08/04/2020  PCP: Haywood Pao, MD Consultants:  Lenna Gilford - rheumatology; [facility] -cardiology; [facility] - GI Patient coming from:  [facility]; [facility] [place], [place]   Chief Complaint: leg pain  HPI: Patricia Villegas is a 85 y.o. female with medical history significant of RA; [name]; DM; HTN; [name]; and CAD s/p CABG presenting with leg pain.  She was initially admitted for a R total hip arthroplasty on 9/21; she was discharged to home with [facility] on 9/22.   She returned to the ER with SOB on 9/30; DVT US was negative, Hgb was low but stable at 7.3, and the patient was discharged back to home.  She was readmitted from 10/6-10 for symptomatic anemia thought to be associated with ABLA from duodenal ulcers causing UGI bleeding.  She was again discharged to home.  She returned from 10/23-28 for generalized weakness with AKI and acute left leg DVT; she was started on Protonix and Eliquis and this time was discharged to [place] for rehab. Since d/c, she is unable to function with PT.  Her LE and core basically can't function.  She is having severe pain with tingling, burning, numbness in her heels.  She is unable to lift her legs.  She was doing well in therapy but got so weak and wasn't able to do anything.  Excruciating pain from her left knee to ankle, which is where she had the clot.  Both arm weakness without pain, left is worse than right.  She is right hand dominant.    ED Course: Several days of leg pains.  Known [name], h/o DVTs on AC.  Withering away since prior hip fracture surgery.  CK elevated.  Myositis vs. rhabdo.  Review of Systems: As per HPI; otherwise review of systems reviewed and negative.   Ambulatory Status:  Ambulated without assistance up to a year ago; last ambulated during therapy at her house around the beginning of October  COVID Vaccine Status:  Complete  Past Medical History:   Diagnosis Date  . Anemia   . Anemia 06/14/2020  . Anxiety   . Arthritis   . Blood transfusion   . Chest pain    a. Lexiscan Myoview 12/12:  EF 75%, no ischemia or scar;  b.  Echo 12/12:  EF 60-65%, no sig valvular abnormalities   . Closed left fibular fracture    Casted   . Complication of anesthesia 1998   Unable to arouse fully, respiratory depression, pt. reports that the family was told that she would need a trach., but then she stabilized & didn't need it   . Coronary artery disease    sees Dr Aundra Dubin every 6 months  . Depression   . Diabetes mellitus    Greater than 20 years type 2   . GERD (gastroesophageal reflux disease)   . H/O hiatal hernia   . Hypercholesteremia   . Hypertension    Greater than 20 years  . Lumbar disc disease    S/p surgery x4;  Sciatica   . Neuropathy due to secondary diabetes ([place])    Hx: of  . Parastomal hernia of ileal conduit 2009  . Pneumonia    s/p CABG  . [name] (peripheral vascular disease) (HCC)    Stable claudication. Arteriogram 07/2009 no significant aortoiliac disease, has bilateral SFA occlusions with distal reconstitution   . Rheumatoid arthritis ([place])    Dr Berna Bue @  [facility]  . Transfusion reaction 1960   Convulsions (? seizure)  . UTI (lower urinary tract infection) 01/03/15    Past Surgical History:  Procedure Laterality Date  . ABDOMINAL HYSTERECTOMY    . BACK SURGERY     4 back operations most recently 1995, 1998  . BIOPSY  06/17/2020   Procedure: BIOPSY;  Surgeon: Yetta Flock, MD;  Location: [facility] ENDOSCOPY;  Service: Gastroenterology;;  . CARDIAC CATHETERIZATION     Hx: of 05/14/13  . CATARACT EXTRACTION W/ INTRAOCULAR LENS  IMPLANT, BILATERAL     Hx: of  . COLONOSCOPY     Hx; of  . COLONOSCOPY WITH PROPOFOL N/A 06/17/2020   Procedure: COLONOSCOPY WITH PROPOFOL;  Surgeon: Yetta Flock, MD;  Location: [facility] ENDOSCOPY;  Service: Gastroenterology;  Laterality: N/A;  . CORONARY ARTERY BYPASS GRAFT  N/A 05/24/2013   Procedure: CORONARY ARTERY BYPASS GRAFTING (CABG);  Surgeon: Melrose Nakayama, MD;  Location: [place];  Service: Open Heart Surgery;  Laterality: N/A;  . DILATION AND CURETTAGE OF UTERUS    . ESOPHAGOGASTRODUODENOSCOPY (EGD) WITH PROPOFOL N/A 06/17/2020   Procedure: ESOPHAGOGASTRODUODENOSCOPY (EGD) WITH PROPOFOL;  Surgeon: Yetta Flock, MD;  Location: [place];  Service: Gastroenterology;  Laterality: N/A;  . HEMOSTASIS CLIP PLACEMENT  06/17/2020   Procedure: HEMOSTASIS CLIP PLACEMENT;  Surgeon: Yetta Flock, MD;  Location: [place] ENDOSCOPY;  Service: Gastroenterology;;  . HERNIA REPAIR  2009  . LEFT HEART CATHETERIZATION WITH CORONARY ANGIOGRAM N/A 05/14/2013   Procedure: LEFT HEART CATHETERIZATION WITH CORONARY ANGIOGRAM;  Surgeon: Larey Dresser, MD;  Location: [facility] CATH LAB;  Service: Cardiovascular;  Laterality: N/A;  . POLYPECTOMY  06/17/2020   Procedure: POLYPECTOMY;  Surgeon: Yetta Flock, MD;  Location: [facility] ENDOSCOPY;  Service: Gastroenterology;;  . TONSILLECTOMY    . TOTAL HIP ARTHROPLASTY Left 01/10/2015   Procedure: TOTAL HIP ARTHROPLASTY ANTERIOR APPROACH;  Surgeon: Melrose Nakayama, MD;  Location: [place];  Service: Orthopedics;  Laterality: Left;  . TOTAL HIP ARTHROPLASTY Right 05/30/2020   Procedure: RIGHT TOTAL HIP ARTHROPLASTY ANTERIOR APPROACH;  Surgeon: Melrose Nakayama, MD;  Location: [facility] ORS;  Service: Orthopedics;  Laterality: Right;    Social History   Socioeconomic History  . Marital status: Married    Spouse name: Not on file  . Number of children: 2  . Years of education: 70  . Highest education level: Not on file  Occupational History  . Occupation: Retired    Fish farm manager: RETIRED  Tobacco Use  . Smoking status: Former Smoker    Packs/day: 0.50    Years: 10.00    Pack years: 5.00    Types: Cigarettes    Quit date: 09/09/1996    Years since quitting: 23.8  . Smokeless tobacco: Current User    Types: Chew  Vaping Use  . Vaping  Use: Never used  Substance and Sexual Activity  . Alcohol use: No  . Drug use: No  . Sexual activity: Not Currently  Other Topics Concern  . Not on file  Social History Narrative   Married and has two children    Social Determinants of Health   Financial Resource Strain:   . Difficulty of Paying Living Expenses: Not on file  Food Insecurity:   . Worried About Charity fundraiser in the Last Year: Not on file  . Ran Out of Food in the Last Year: Not on file  Transportation Needs:   . Lack of Transportation (Medical): Not on file  . Lack of  Transportation (Non-Medical): Not on file  Physical Activity:   . Days of Exercise per Week: Not on file  . Minutes of Exercise per Session: Not on file  Stress:   . Feeling of Stress : Not on file  Social Connections:   . Frequency of Communication with Friends and Family: Not on file  . Frequency of Social Gatherings with Friends and Family: Not on file  . Attends Religious Services: Not on file  . Active Member of Clubs or Organizations: Not on file  . Attends Archivist Meetings: Not on file  . Marital Status: Not on file  Intimate Partner Violence:   . Fear of Current or Ex-Partner: Not on file  . Emotionally Abused: Not on file  . Physically Abused: Not on file  . Sexually Abused: Not on file    No Known Allergies  Family History  Problem Relation Age of Onset  . Coronary artery disease Mother 83  . Diabetes Mother   . Stroke Mother   . Heart attack Mother   . Cancer Brother   . Heart disease Brother   . Stroke Brother   . Kidney disease Brother   . Cancer Sister   . Diabetes Brother     Prior to Admission medications   Medication Sig Start Date End Date Taking? Authorizing Provider  allopurinol (ZYLOPRIM) 100 MG tablet Take 200 mg by mouth daily.  10/26/19   [provider]  apixaban (ELIQUIS) 5 MG TABS tablet 10 mg twice daily for 7 days followed by 5 mg twice daily. 07/06/20   Terrilee Croak, MD   feeding supplement, ENSURE ENLIVE, (ENSURE ENLIVE) LIQD Take 237 mLs by mouth 2 (two) times daily between meals. Patient taking differently: Take 237 mLs by mouth 2 (two) times daily as needed (supplement).  06/18/20 07/18/20  Arrien, Jimmy Picket, MD  folic acid (FOLVITE) 1 MG tablet Take 2 mg by mouth daily. 05/10/20   [provider]  furosemide (LASIX) 20 MG tablet Take 20 mg by mouth daily. 03/06/20   [provider]  furosemide (LASIX) 40 MG tablet Take 1 tablet (40 mg total) by mouth daily as needed for edema. 07/06/20   Terrilee Croak, MD  hydroxychloroquine (PLAQUENIL) 200 MG tablet Take 200 mg by mouth 2 (two) times daily. 10/26/19   [provider]  insulin aspart (NOVOLOG) 100 UNIT/ML injection Inject 0-5 Units into the skin at bedtime. 07/06/20   Dahal, Marlowe Aschoff, MD  insulin aspart (NOVOLOG) 100 UNIT/ML injection Inject 0-6 Units into the skin 3 (three) times daily with meals. 07/06/20   Dahal, Marlowe Aschoff, MD  insulin glargine (LANTUS) 100 UNIT/ML injection Inject 0.05 mLs (5 Units total) into the skin every evening. 07/06/20   Dahal, Marlowe Aschoff, MD  melatonin 3 MG TABS tablet Take 1 tablet (3 mg total) by mouth at bedtime. 07/06/20   Terrilee Croak, MD  methotrexate (RHEUMATREX) 2.5 MG tablet Caution:Chemotherapy. Protect from light. Please resume your regular dose as before admission to the hospital. 06/18/20   Arrien, Jimmy Picket, MD  metoprolol tartrate (LOPRESSOR) 25 MG tablet Take 0.5 tablets (12.5 mg total) by mouth 2 (two) times daily. 07/19/19   Darreld [name], PA-C  ONE TOUCH ULTRA TEST test strip  06/24/13   [provider]  pantoprazole (PROTONIX) 40 MG tablet Take 1 tablet (40 mg total) by mouth 2 (two) times daily. Take twice daily for one month then continue with once daily. 06/18/20 07/18/20  Arrien, Jimmy Picket, MD  predniSONE ([place])  5 MG tablet Take 5 mg by mouth daily with breakfast.     [provider]  senna-docusate  (SENOKOT-S) 8.6-50 MG tablet Take 1 tablet by mouth daily as needed for mild constipation.    [provider]  simvastatin (ZOCOR) 80 MG tablet Take 80 mg by mouth at bedtime.      [provider]  tiZANidine (ZANAFLEX) 2 MG tablet Take 1-2 tablets (2-4 mg total) by mouth every 6 (six) hours as needed for muscle spasms. Patient not taking: Reported on 07/01/2020 05/30/20 05/30/21  Loni Dolly, PA-C    Physical Exam: Vitals:   07/23/2020 0031 07/22/2020 0239 07/18/2020 0611 07/12/2020 0630  BP:  (!) 124/56 130/82 131/82  Pulse:  68 76 78  Resp:  16 18 16   Temp:    97.9 F (36.6 C)  TempSrc:    Oral  SpO2:  98% 96% 99%  Weight: 66.2 kg     Height: 5\' 6"  (1.676 m)        . General:  Appears calm and comfortable and is NAD . Eyes:  PERRL, EOMI, normal lids, iris . ENT:  grossly normal hearing, lips & tongue, mmm . Neck:  no LAD, masses or thyromegaly . Cardiovascular:  RRR, no m/r/g. No LE edema.  Marland Kitchen Respiratory:   CTA bilaterally with no wheezes/rales/rhonchi.  Normal respiratory effort. . Abdomen:  soft, NT, ND, NABS . Skin:  no rash or induration seen on limited exam . Musculoskeletal:  4-5/5 strength in the B UE but R >L, 4/5 strength in the BLE and again R > L . Lower extremity:  No LE edema.  Limited foot exam with no ulcerations.  1-2+ distal pulses. Marland Kitchen Psychiatric:  blunted mood and affect, speech fluent and appropriate, AOx3 . Neurologic:  CN 2-12 grossly intact, moves all extremities in coordinated fashion    Radiological Exams on Admission: Independently reviewed - see discussion in A/P where applicable  No results found.  EKG: not done   Labs on Admission: I have personally reviewed the available labs and imaging studies at the time of the admission.  Pertinent labs:   Unremarkable BMP CK 11,964 -> 15,891 WBC 15.5 Hgb 8.8 - stable   Assessment/Plan Active Problems:   * No active hospital problems. *    Rhabdomyolysis -CK 15,891 - increased  even while in the ER with IVF -While this may simply be associated with rhabdomyolysis, it appears that it is instead associated with a myositis (see below) -Zocor is also a possible offender and will be held -Will observe for now with aggressive IVF at 200 cc/hour -Hepatic function panel, CK, myoglobin (blood), and BMP q12h -CBC qAM -Continue Eliquis for DVT prophylaxis -Push PO fluids if able -Strict I/Os -Vital signs q4h -Hepatic and renal injuries are usually reversible with aggressive rehydration but will need ongoing monitoring  Presumed myositis -She has marked elevation of CK but normal renal function -Her deficits are primarily associated with subacute and progressive muscle weakness - generally more proximal -She does not have any apparent skin findings suggestive of dermatomyositis -Consider myositis antibodies although results often take weeks to return and so are unlikely to guide treatment -She has a h/o RA and could have an autoimmune myositis -Plaquenil can be associated with myositis and so will be held -EMG and MRI could be considered but are less likely to be helpful -Muscle biopsy is likely needed, but will rehydrate in an attempt to improve rhabdo prior to requesting surgery consult for biopsy -  Assuming she has a diagnosis of myositis, glucocorticoids are the mainstay of treatment also with glucocorticoid-sparing treatments such as methotrexate (previously on this medication but not currently); mycophenalate; azathioprine; and rituximab; IVIG is also a consideration for treatment. -PT will be important once CK normalizes -Osteoporosis prevention is important for patients on chronic glucocorticoids -Will request neurology consult to help guide diagnosis and treatment  Recent DVT -Patient with recent diagnosis of DVT -She is on Eliquis -Appropriate dose appears to be 5 mg BID and so this has been ordered - but may need to be held for muscle biopsy  Chronic  anemia -Patient with prior hospitalization with duodenal ulcers causing acute on chronic anemia -Hgb currently appears to be stable -However, patient does report black stools when she is able to have one - but mostly severe constipation (see below) -Since blood is a powerful cathartic, it seems less likely that she is having persistent significant bleeding - particularly with stable Hgb and patient is on iron -Will follow CBC, especially with aggressive volume expansion  Constipation -Patient reports chronic severe constipation -I have asked the nurses to help do a manual disimpaction -Will also order bowel regimen -She is having some urinary retention currently which is likely exacerbated by large stool ball -Black stools may also be associated with a stercoral ulcer -Will follow for now without GI consultation  Urinary retention -As noted above, may be associated with constipation -If unable to void despite marked hydration, she will need a foley placed -Bladder scan requested by nursing  Chronic diastolic CHF -Will need close monitoring with aggressive IVF -Appears to be compensated at time time -Prior echo with preserved EF and grade 1 diastolic dysfunction  HTN -Continue Lopressor  CAD s/p CABG -On Eliquis so no ASA at this time -No concern for ACS at this time  [name] -Hold Zocor due to rhabdomyolysis and myalgias  RA -Hold Plaquenil in the setting of possible myositis, as this medication can cause it -She is no longer taking methotrexate - which could potentially have been keeping myositis in check? -Hold prednisone and start stress-dosed steroids for now  DM -Recent A1c shows good control -Continue Lantus -Will cover with sensitive-scale SSI  DNR -We had an extensive discussion about CPR at the time of admission -Based on her overall frailty and recent fragility, I recommended against resuscitation -The patient and her DIL are in agreement    Note: This  patient has been tested and is negative for the novel coronavirus COVID-19. The patient has been fully vaccinated against COVID-19.    DVT prophylaxis: Eliquis Code Status:  DNR - confirmed with patient/family Family Communication: Daughter-in-law was present throughout evaluation Disposition Plan:  The patient is from: SNF rehab  Anticipated d/c is to: SNF rehab  Anticipated d/c date will depend on clinical response to treatment, but likely several days  Patient is currently: acutely ill Consults called: Neurology; will need PT/OT once CK is downtrending  Admission status:  Admit - It is my clinical opinion that admission to INPATIENT is reasonable and necessary because of the expectation that this patient will require hospital care that crosses at least 2 midnights to treat this condition based on the medical complexity of the problems presented.  Given the aforementioned information, the predictability of an adverse outcome is felt to be significant.    Karmen Bongo MD [facility]   How to contact the [facility] Attending or Consulting provider [place] or covering provider during after hours [place], for  this patient?  1. Check the care team in [facility] and look for a) attending/consulting [facility] provider listed and b) the [facility] team listed 2. Log into [email or web address] and use [place]'s universal password to access. If you do not have the password, please contact the hospital operator. 3. Locate the [facility] provider you are looking for under [facility] and page to a number that you can be directly reached. 4. If you still have difficulty reaching the provider, please page the [facility] (Director on Call) for the Hospitalists listed on amion for assistance.   07/31/2020, 7:32 AM

## 2020-07-11 NOTE — ED Provider Notes (Signed)
[place] EMERGENCY DEPARTMENT Provider Note   CSN: [id number] Arrival date & time: 07/12/2020  0019     History No chief complaint on file.   Patricia Villegas is a 85 y.o. female.   Leg Pain Location:  Leg Leg location:  R leg and L leg Pain details:    Quality:  Tingling   Radiates to:  Does not radiate   Severity:  Moderate   Onset quality:  Gradual   Timing:  Intermittent   Progression:  Waxing and waning Chronicity:  Chronic Prior injury to area:  No Relieved by:  Movement Worsened by:  Nothing Ineffective treatments:  None tried Associated symptoms: no back pain and no fever        Past Medical History:  Diagnosis Date  . Anemia   . Anemia 06/14/2020  . Anxiety   . Arthritis   . Blood transfusion   . Chest pain    a. Lexiscan Myoview 12/12:  EF 75%, no ischemia or scar;  b.  Echo 12/12:  EF 60-65%, no sig valvular abnormalities   . Closed left fibular fracture    Casted   . Complication of anesthesia 1998   Unable to arouse fully, respiratory depression, pt. reports that the family was told that she would need a trach., but then she stabilized & didn't need it   . Coronary artery disease    sees Dr Aundra Dubin every 6 months  . Depression   . Diabetes mellitus    Greater than 20 years type 2   . GERD (gastroesophageal reflux disease)   . H/O hiatal hernia   . Hypercholesteremia   . Hypertension    Greater than 20 years  . Lumbar disc disease    S/p surgery x4;  Sciatica   . Neuropathy due to secondary diabetes ([place])    Hx: of  . Parastomal hernia of ileal conduit 2009  . Pneumonia    s/p CABG  . [name] (peripheral vascular disease) (H[facility])    Stable claudication. Arteriogram 07/2009 no significant aortoiliac disease, has bilateral SFA occlusions with distal reconstitution   . Rheumatoid arthritis ([place])    Dr Berna Bue @ [facility]  . Transfusion reaction 1960   Convulsions (? seizure)  . UTI (lower urinary tract infection)  01/03/15    Patient Active Problem List   Diagnosis Date Noted  . Generalized weakness 07/02/2020  . Type 2 diabetes mellitus with diabetic polyneuropathy, with long-term current use of insulin ([place]) 07/02/2020  . Coronary artery disease involving native coronary artery of native heart without angina pectoris 07/02/2020  . Bilateral leg edema 07/02/2020  . Duodenal ulcer   . Benign neoplasm of sigmoid colon   . Gastrointestinal hemorrhage   . Macrocytic anemia 06/15/2020  . Symptomatic anemia 06/14/2020  . Primary localized osteoarthritis of right hip 05/30/2020  . S/P total hip arthroplasty 05/30/2020  . Primary osteoarthritis of right hip 05/30/2020  . Carotid bruit 03/21/2016  . Primary osteoarthritis of left hip 01/10/2015  . Chronic diastolic CHF (congestive heart failure) ([place]) 06/30/2013  . CAD (coronary artery disease) of artery bypass graft 05/18/2013  . Femoral artery occlusion, bilateral 05/18/2013  . Diabetes 1.5, managed as type 2 ([place]) 05/18/2013  . Rheumatoid arthritis ([place]) 05/18/2013  . Chest pain 09/18/2011  . Peripheral vascular disease ([place]) 09/18/2011  . Essential hypertension 08/23/2011  . Mixed hyperlipidemia 08/23/2011    Past Surgical History:  Procedure Laterality Date  . ABDOMINAL HYSTERECTOMY    .  BACK SURGERY     4 back operations most recently 1995, 1998  . BIOPSY  06/17/2020   Procedure: BIOPSY;  Surgeon: Yetta Flock, MD;  Location: [facility] ENDOSCOPY;  Service: Gastroenterology;;  . CARDIAC CATHETERIZATION     Hx: of 05/14/13  . CATARACT EXTRACTION W/ INTRAOCULAR LENS  IMPLANT, BILATERAL     Hx: of  . COLONOSCOPY     Hx; of  . COLONOSCOPY WITH PROPOFOL N/A 06/17/2020   Procedure: COLONOSCOPY WITH PROPOFOL;  Surgeon: Yetta Flock, MD;  Location: [facility] ENDOSCOPY;  Service: Gastroenterology;  Laterality: N/A;  . CORONARY ARTERY BYPASS GRAFT N/A 05/24/2013   Procedure: CORONARY ARTERY BYPASS GRAFTING (CABG);  Surgeon: Melrose Nakayama,  MD;  Location: [place];  Service: Open Heart Surgery;  Laterality: N/A;  . DILATION AND CURETTAGE OF UTERUS    . ESOPHAGOGASTRODUODENOSCOPY (EGD) WITH PROPOFOL N/A 06/17/2020   Procedure: ESOPHAGOGASTRODUODENOSCOPY (EGD) WITH PROPOFOL;  Surgeon: Yetta Flock, MD;  Location: [place];  Service: Gastroenterology;  Laterality: N/A;  . HEMOSTASIS CLIP PLACEMENT  06/17/2020   Procedure: HEMOSTASIS CLIP PLACEMENT;  Surgeon: Yetta Flock, MD;  Location: [place] ENDOSCOPY;  Service: Gastroenterology;;  . HERNIA REPAIR  2009  . LEFT HEART CATHETERIZATION WITH CORONARY ANGIOGRAM N/A 05/14/2013   Procedure: LEFT HEART CATHETERIZATION WITH CORONARY ANGIOGRAM;  Surgeon: Larey Dresser, MD;  Location: [facility] CATH LAB;  Service: Cardiovascular;  Laterality: N/A;  . POLYPECTOMY  06/17/2020   Procedure: POLYPECTOMY;  Surgeon: Yetta Flock, MD;  Location: [facility] ENDOSCOPY;  Service: Gastroenterology;;  . TONSILLECTOMY    . TOTAL HIP ARTHROPLASTY Left 01/10/2015   Procedure: TOTAL HIP ARTHROPLASTY ANTERIOR APPROACH;  Surgeon: Melrose Nakayama, MD;  Location: [place];  Service: Orthopedics;  Laterality: Left;  . TOTAL HIP ARTHROPLASTY Right 05/30/2020   Procedure: RIGHT TOTAL HIP ARTHROPLASTY ANTERIOR APPROACH;  Surgeon: Melrose Nakayama, MD;  Location: [facility] ORS;  Service: Orthopedics;  Laterality: Right;     OB History   No obstetric history on file.     Family History  Problem Relation Age of Onset  . Coronary artery disease Mother 66  . Diabetes Mother   . Stroke Mother   . Heart attack Mother   . Cancer Brother   . Heart disease Brother   . Stroke Brother   . Kidney disease Brother   . Cancer Sister   . Diabetes Brother     Social History   Tobacco Use  . Smoking status: Former Smoker    Packs/day: 0.50    Years: 10.00    Pack years: 5.00    Types: Cigarettes    Quit date: 09/09/1996    Years since quitting: 23.8  . Smokeless tobacco: Current User    Types: Chew  Vaping Use  . Vaping  Use: Never used  Substance Use Topics  . Alcohol use: No  . Drug use: No    Home Medications Prior to Admission medications   Medication Sig Start Date End Date Taking? Authorizing Provider  allopurinol (ZYLOPRIM) 100 MG tablet Take 200 mg by mouth daily.  10/26/19   [provider]  apixaban (ELIQUIS) 5 MG TABS tablet 10 mg twice daily for 7 days followed by 5 mg twice daily. 07/06/20   Terrilee Croak, MD  feeding supplement, ENSURE ENLIVE, (ENSURE ENLIVE) LIQD Take 237 mLs by mouth 2 (two) times daily between meals. Patient taking differently: Take 237 mLs by mouth 2 (two) times daily as needed (supplement).  06/18/20 07/18/20  Arrien, Mauricio  Quillian Quince, MD  folic acid (FOLVITE) 1 MG tablet Take 2 mg by mouth daily. 05/10/20   [provider]  furosemide (LASIX) 40 MG tablet Take 1 tablet (40 mg total) by mouth daily as needed for edema. 07/06/20   Terrilee Croak, MD  hydroxychloroquine (PLAQUENIL) 200 MG tablet Take 200 mg by mouth 2 (two) times daily. 10/26/19   [provider]  insulin aspart (NOVOLOG) 100 UNIT/ML injection Inject 0-5 Units into the skin at bedtime. 07/06/20   Dahal, Marlowe Aschoff, MD  insulin aspart (NOVOLOG) 100 UNIT/ML injection Inject 0-6 Units into the skin 3 (three) times daily with meals. 07/06/20   Dahal, Marlowe Aschoff, MD  insulin glargine (LANTUS) 100 UNIT/ML injection Inject 0.05 mLs (5 Units total) into the skin every evening. 07/06/20   Dahal, Marlowe Aschoff, MD  melatonin 3 MG TABS tablet Take 1 tablet (3 mg total) by mouth at bedtime. 07/06/20   Terrilee Croak, MD  methotrexate (RHEUMATREX) 2.5 MG tablet Caution:Chemotherapy. Protect from light. Please resume your regular dose as before admission to the hospital. 06/18/20   Arrien, Jimmy Picket, MD  metoprolol tartrate (LOPRESSOR) 25 MG tablet Take 0.5 tablets (12.5 mg total) by mouth 2 (two) times daily. 07/19/19   Darreld [name], PA-C  ONE TOUCH ULTRA TEST test strip  06/24/13   [provider]    pantoprazole (PROTONIX) 40 MG tablet Take 1 tablet (40 mg total) by mouth 2 (two) times daily. Take twice daily for one month then continue with once daily. 06/18/20 07/18/20  Arrien, Jimmy Picket, MD  predniSONE (DELTASONE) 5 MG tablet Take 5 mg by mouth daily with breakfast.     [provider]  senna-docusate (SENOKOT-S) 8.6-50 MG tablet Take 1 tablet by mouth daily as needed for mild constipation.    [provider]  simvastatin (ZOCOR) 80 MG tablet Take 80 mg by mouth at bedtime.      [provider]  tiZANidine (ZANAFLEX) 2 MG tablet Take 1-2 tablets (2-4 mg total) by mouth every 6 (six) hours as needed for muscle spasms. Patient not taking: Reported on 07/01/2020 05/30/20 05/30/21  Loni Dolly, PA-C    Allergies    Patient has no known allergies.  Review of Systems   Review of Systems  Constitutional: Negative for chills and fever.  HENT: Negative for congestion and rhinorrhea.   Respiratory: Negative for cough and shortness of breath.   Cardiovascular: Negative for chest pain and palpitations.  Gastrointestinal: Negative for diarrhea, nausea and vomiting.  Genitourinary: Negative for difficulty urinating and dysuria.  Musculoskeletal: Positive for myalgias. Negative for arthralgias and back pain.  Skin: Negative for rash and wound.  Neurological: Negative for light-headedness and headaches.    Physical Exam Updated Vital Signs BP 131/82   Pulse 78   Temp 97.9 F (36.6 C) (Oral)   Resp 16   Ht 5\' 6"  (1.676 m)   Wt 66.2 kg   SpO2 99%   BMI 23.57 kg/m   Physical Exam Vitals and nursing note reviewed. Exam conducted with a chaperone present.  Constitutional:      General: She is not in acute distress.    Appearance: Normal appearance.  HENT:     Head: Normocephalic and atraumatic.     Nose: No rhinorrhea.  Eyes:     General:        Right eye: No discharge.        Left eye: No discharge.     Conjunctiva/sclera: Conjunctivae normal.   Cardiovascular:  Rate and Rhythm: Normal rate and regular rhythm.     Comments: Saint palpable pulses bilaterally dorsalis pedis confirmed by Doppler.  Extremities warm Pulmonary:     Effort: Pulmonary effort is normal. No respiratory distress.     Breath sounds: No stridor.  Abdominal:     General: Abdomen is flat. There is no distension.     Palpations: Abdomen is soft.  Musculoskeletal:        General: No tenderness or signs of injury.     Right lower leg: No edema.     Left lower leg: No edema.  Skin:    General: Skin is warm and dry.     Capillary Refill: Capillary refill takes less than 2 seconds.  Neurological:     General: No focal deficit present.     Mental Status: She is alert. Mental status is at baseline.     Motor: No weakness.  Psychiatric:        Mood and Affect: Mood normal.        Behavior: Behavior normal.     ED Results / Procedures / Treatments   Labs (all labs ordered are listed, but only abnormal results are displayed) Labs Reviewed  BASIC METABOLIC PANEL - Abnormal; Notable for the following components:      Result Value   Glucose, Bld 106 (*)    Calcium 8.4 (*)    All other components within normal limits  CK - Abnormal; Notable for the following components:   Total CK 11,964 (*)    All other components within normal limits  CBC - Abnormal; Notable for the following components:   WBC 15.5 (*)    RBC 2.66 (*)    Hemoglobin 8.8 (*)    HCT 28.0 (*)    MCV 105.3 (*)    RDW 18.7 (*)    All other components within normal limits  BASIC METABOLIC PANEL - Abnormal; Notable for the following components:   Calcium 8.3 (*)    All other components within normal limits  CK - Abnormal; Notable for the following components:   Total CK 15,891 (*)    All other components within normal limits  RESPIRATORY PANEL BY RT PCR (FLU A&B, COVID)  MAGNESIUM    EKG None  Radiology No results found.  Procedures Procedures (including critical care  time)  Medications Ordered in ED Medications  lactated ringers infusion ( Intravenous New Bag/Given 07/10/2020 0630)  lactated ringers bolus 1,000 mL (has no administration in time range)  lactated ringers bolus 1,000 mL (0 mLs Intravenous Stopped 07/13/2020 0437)    ED Course  I have reviewed the triage vital signs and the nursing notes.  Pertinent labs & imaging results that were available during my care of the patient were reviewed by me and considered in my medical decision making (see chart for details).    MDM Rules/Calculators/A&P                          History of intermittent bilateral leg pain described as tingling, history of diabetes, possibly neuropathy.  Also known to have blood clots on anticoagulation.  No chest pain no shortness of breath.  Also known to have peripheral vascular disease, no signs of arterial occlusion here today.  Pain is now resolved, she believes the movement of getting into the ambulance and getting here has helped the pain.  We will check screening labs we will check for electrolyte abnormalities and muscle  inflammation, his labs are normal they will be discharged home with outpatient follow-up, possibly starting on a new neuropathy medication.  Patient's CK is elevated at around 12,000. Kidney function appears normal. Other laboratory studies after my review are fairly unremarkable. I feel that she may have rhabdomyolysis due to her significant bedbound status had a rehab facility. IV hydration is initiated. I consulted the hospitalist for possible need of admission, with the level of elevation the patient would likely need hydration over several hours to days. They would recommend hydrating and repeat check, using a threshold at around 5000, if the patient's CK is improved in the chemistry shows stable kidney function she will be safe for discharge if not we will admit.  Repeat CK is elevated from previous, kidney function is still stable.  Additional IV  fluids were given the patient will be admitted. Viral testing is pending    Final Clinical Impression(s) / ED Diagnoses Final diagnoses:  Non-traumatic rhabdomyolysis    Rx / DC Orders ED Discharge Orders    None       Breck Coons, MD 08/02/2020 0710

## 2020-07-11 NOTE — ED Notes (Signed)
Patient transported to CT 

## 2020-07-11 NOTE — ED Notes (Signed)
Pt is still on blood thinners per Dr. Ron Parker

## 2020-07-11 NOTE — Consult Note (Addendum)
Neurology Consultation Reason for Consult: Myositis Referring Physician:  Dr. Lorin Mercy  CC: Proximal weakness of upper and lower extremities with soreness of muscles of insidious onset  History is obtained from: Patient and daughter  HPI: Patricia Villegas is a 85 y.o. female with history of [name], DVT on Eliquis, rheumatoid arthritis, hypertension, hypercholesterolemia, CAD, recent right total hip arthroplasty, anemia, diabetes, coronary artery disease status post CABG.  Initially her weakness started after her hip arthroplasty.  She had a couple physical therapy sessions however then she became very weak came back to the hospital was found to have a bleeding ulcer.  She required a few units of blood.  They also found a DVT to which she was placed on Eliquis.  However approximately 3 to 4 weeks ago patient had an insidious slow progressive onset of weakness.  She notes that this was proximal and that she first noticed difficulty with tasks such as combing her hair and getting up out of a recliner or out of bed, but denied issues with things like manipulating buttons.  In particular she reported she could hold a fork and pierce her food but she could not get it to her mouth.  She has even noticed some muscle wasting in her legs right greater than left.  Patient also states that she has been on her statin for multiple years.  She also has a longstanding lower extremity neuropathy.  She notes in the past 2 weeks she has had significant abdominal distention as well as swelling of her arms and legs.  She complains of a slight headache which is atypical but not bothersome to her, but denies any vision changes including double vision or ptosis. She has been having worsening dysphagia in the same time period   On admission to the hospital patient was noted to have a AST 525, ALT 252, CK 11,964 and status post fluids 15,891 along with white blood cell count of 15.5, hemoglobin 8.8, hematocrit of 28.0, TSH of 6.114, BUN 21  and creatinine of 0.66.  She has had a complicated course since her elective hip surgery in September 2021.  The initial operation was uncomplicated and she was discharged the day after her surgery, on 9/22.  However she presented on 10/10 with acute on chronic anemia with GI bleeding from duodenal ulcers and required 2 units of red blood cells.  She also has severe peripheral vascular disease of the bilateral lower extremities.  Her rheumatoid arthritis is controlled on hydroxychloroquine, prednisone and allopurinol  ROS: A 14 point ROS was performed and is negative except as noted in the HPI.  Past Medical History:  Diagnosis Date  . Anemia   . Anemia 06/14/2020  . Anxiety   . Arthritis   . Blood transfusion   . Chest pain    a. Lexiscan Myoview 12/12:  EF 75%, no ischemia or scar;  b.  Echo 12/12:  EF 60-65%, no sig valvular abnormalities   . Closed left fibular fracture    Casted   . Complication of anesthesia 1998   Unable to arouse fully, respiratory depression, pt. reports that the family was told that she would need a trach., but then she stabilized & didn't need it   . Coronary artery disease    sees Dr Aundra Dubin every 6 months  . Depression   . Diabetes mellitus    Greater than 20 years type 2   . GERD (gastroesophageal reflux disease)   . H/O hiatal hernia   .  Hypercholesteremia   . Hypertension    Greater than 20 years  . Lumbar disc disease    S/p surgery x4;  Sciatica   . Neuropathy due to secondary diabetes ([place])    Hx: of  . Parastomal hernia of ileal conduit 2009  . Pneumonia    s/p CABG  . [name] (peripheral vascular disease) ([facility])    Stable claudication. Arteriogram 07/2009 no significant aortoiliac disease, has bilateral SFA occlusions with distal reconstitution   . Rheumatoid arthritis ([place])    Dr Berna Bue @ [facility]  . Transfusion reaction 1960   Convulsions (? seizure)  . UTI (lower urinary tract infection) 01/03/15    Current  Facility-Administered Medications:  .  acetaminophen (TYLENOL) tablet 650 mg, 650 mg, Oral, Q6H PRN **OR** acetaminophen (TYLENOL) suppository 650 mg, 650 mg, Rectal, Q6H PRN, Karmen Bongo, MD .  allopurinol (ZYLOPRIM) tablet 200 mg, 200 mg, Oral, Daily, Karmen Bongo, MD, 200 mg at 08/07/2020 1026 .  apixaban (ELIQUIS) tablet 5 mg, 5 mg, Oral, BID, Karmen Bongo, MD, 5 mg at 07/23/2020 1027 .  bisacodyl (DULCOLAX) EC tablet 5 mg, 5 mg, Oral, Daily PRN, Karmen Bongo, MD .  docusate sodium (COLACE) capsule 100 mg, 100 mg, Oral, BID, Karmen Bongo, MD, 100 mg at 38/25/05 3976 .  folic acid (FOLVITE) tablet 2 mg, 2 mg, Oral, Daily, Karmen Bongo, MD, 2 mg at 07/10/2020 1026 .  hydrALAZINE (APRESOLINE) injection 5 mg, 5 mg, Intravenous, Q4H PRN, Karmen Bongo, MD .  HYDROcodone-acetaminophen (NORCO/VICODIN) 5-325 MG per tablet 1-2 tablet, 1-2 tablet, Oral, Q4H PRN, Karmen Bongo, MD, 2 tablet at 08/08/2020 2129 .  hydrocortisone sodium succinate (SOLU-CORTEF) 100 MG injection 50 mg, 50 mg, Intravenous, Q6H, Karmen Bongo, MD, 50 mg at 08/01/2020 2129 .  insulin aspart (novoLOG) injection 0-9 Units, 0-9 Units, Subcutaneous, TID WC, Karmen Bongo, MD .  insulin glargine (LANTUS) injection 10 Units, 10 Units, Subcutaneous, QPM, Karmen Bongo, MD .  lactated ringers infusion, , Intravenous, Continuous, Karmen Bongo, MD, Last Rate: 200 mL/hr at 07/16/2020 1342, New Bag at 08/06/2020 1342 .  liver oil-zinc oxide (DESITIN) 40 % ointment 1 application, 1 application, Topical, TID, Karmen Bongo, MD .  melatonin tablet 3 mg, 3 mg, Oral, QHS, Karmen Bongo, MD .  metoCLOPramide (REGLAN) tablet 5 mg, 5 mg, Oral, QHS, Karmen Bongo, MD .  metoprolol tartrate (LOPRESSOR) tablet 12.5 mg, 12.5 mg, Oral, BID, Karmen Bongo, MD, 12.5 mg at 08/06/2020 1026 .  morphine 2 MG/ML injection 2 mg, 2 mg, Intravenous, Q2H PRN, Karmen Bongo, MD .  ondansetron [facility]) tablet 4 mg, 4 mg, Oral, Q6H PRN **OR**  ondansetron (ZOFRAN) injection 4 mg, 4 mg, Intravenous, Q6H PRN, Karmen Bongo, MD .  pantoprazole (PROTONIX) EC tablet 40 mg, 40 mg, Oral, BID, Karmen Bongo, MD, 40 mg at 07/17/2020 1027 .  polyethylene glycol (MIRALAX / GLYCOLAX) packet 17 g, 17 g, Oral, Daily PRN, Karmen Bongo, MD .  senna-docusate (Senokot-S) tablet 1 tablet, 1 tablet, Oral, Daily PRN, Karmen Bongo, MD .  tiZANidine (ZANAFLEX) tablet 2 mg, 2 mg, Oral, Q6H PRN, Karmen Bongo, MD  Current Outpatient Medications:  .  allopurinol (ZYLOPRIM) 100 MG tablet, Take 200 mg by mouth daily. , Disp: , Rfl:  .  apixaban (ELIQUIS) 5 MG TABS tablet, 10 mg twice daily for 7 days followed by 5 mg twice daily. (Patient taking differently: Take 10 mg by mouth 2 (two) times daily. ), Disp: 60 tablet, Rfl:  .  folic acid (FOLVITE) 1  MG tablet, Take 2 mg by mouth daily., Disp: , Rfl:  .  hydroxychloroquine (PLAQUENIL) 200 MG tablet, Take 200 mg by mouth 2 (two) times daily., Disp: , Rfl:  .  [employer] [facility] EX), Apply 1 application topically 3 (three) times daily. Every shift to buttocks, Disp: , Rfl:  .  insulin aspart (NOVOLOG) 100 UNIT/ML injection, Inject 0-5 Units into the skin at bedtime. (Patient taking differently: Inject 0-6 Units into the skin with breakfast, with lunch, and with evening meal. 0-150 0 units 151-200 1 unit 201-250 2 units 251-300 3 units 301-350 4 units 351-400 5 units 401+ 6 units & call MD), Disp: 10 mL, Rfl: 11 .  insulin aspart (NOVOLOG) 100 UNIT/ML injection, Inject 0-6 Units into the skin 3 (three) times daily with meals. (Patient taking differently: Inject 0-5 Units into the skin at bedtime as needed for high blood sugar. 0-200 0 units 201-250 2 units 251-300 3 units 301-350 4 units 351-400 5 units 401+ call MD), Disp: 10 mL, Rfl: 11 .  insulin glargine (LANTUS) 100 UNIT/ML injection, Inject 0.05 mLs (5 Units total) into the skin every evening. (Patient taking differently: Inject 10 Units into  the skin every evening. ), Disp: 10 mL, Rfl: 11 .  melatonin 3 MG TABS tablet, Take 1 tablet (3 mg total) by mouth at bedtime., Disp: , Rfl: 0 .  metoCLOPramide (REGLAN) 5 MG tablet, Take 5 mg by mouth at bedtime., Disp: , Rfl:  .  metoprolol tartrate (LOPRESSOR) 25 MG tablet, Take 0.5 tablets (12.5 mg total) by mouth 2 (two) times daily., Disp: 90 tablet, Rfl: 3 .  pantoprazole (PROTONIX) 40 MG tablet, Take 1 tablet (40 mg total) by mouth 2 (two) times daily. Take twice daily for one month then continue with once daily., Disp: 60 tablet, Rfl: 0 .  predniSONE (DELTASONE) 5 MG tablet, Take 5 mg by mouth daily with breakfast. , Disp: , Rfl:  .  senna-docusate (SENOKOT-S) 8.6-50 MG tablet, Take 1 tablet by mouth daily as needed for mild constipation., Disp: , Rfl:  .  simvastatin (ZOCOR) 80 MG tablet, Take 80 mg by mouth at bedtime.  , Disp: , Rfl:  .  tiZANidine (ZANAFLEX) 2 MG tablet, Take 1-2 tablets (2-4 mg total) by mouth every 6 (six) hours as needed for muscle spasms. (Patient taking differently: Take 2 mg by mouth every 6 (six) hours as needed for muscle spasms. ), Disp: 40 tablet, Rfl: 1 .  ONE TOUCH ULTRA TEST test strip, , Disp: , Rfl:    Family History  Problem Relation Age of Onset  . Coronary artery disease Mother 72  . Diabetes Mother   . Stroke Mother   . Heart attack Mother   . Cancer Brother   . Heart disease Brother   . Stroke Brother   . Kidney disease Brother   . Cancer Sister   . Diabetes Brother   Denies any known family history of muscle weakness  Social History:  reports that she quit smoking about 23 years ago. Her smoking use included cigarettes. She has a 5.00 pack-year smoking history. Her smokeless tobacco use includes chew. She reports that she does not drink alcohol and does not use drugs.  Exam: Current vital signs: BP (!) 128/53   Pulse 76   Temp 97.9 F (36.6 C) (Oral)   Resp (!) 24   Ht 5' 6"  (1.676 m)   Wt 66.2 kg   SpO2 100%   BMI 23.57 kg/m  Vital signs in last 24 hours: Temp:  [97.7 F (36.5 C)-97.9 F (36.6 C)] 97.9 F (36.6 C) (11/02 0630) Pulse Rate:  [67-96] 76 (11/02 1000) Resp:  [16-24] 24 (11/02 0900) BP: (123-147)/(53-136) 128/53 (11/02 1000) SpO2:  [95 %-100 %] 100 % (11/02 1000) Weight:  [66.2 kg] 66.2 kg (11/02 0031)   Physical Exam  Constitutional: Appears well-developed and well-nourished.  Eyes: No scleral injection HENT: No OP obstruction MSK: no joint deformities.  Cardiovascular: Normal rate and regular rhythm.  Respiratory: Effort normal, non-labored breathing GI: Soft.  No distension. There is no tenderness.  Skin: WDI  Neuro: Mental Status: Patient is awake, alert, oriented to person, place, month, year, and situation. Patient is able to give a good history. No signs of aphasia or neglect Cranial Nerves: II: Visual Fields are full. Pupils are equal, round, and reactive to light.   III,IV, VI: EOMI without ptosis or diploplia.  V: Facial sensation is symmetric to temperature VII: Facial movement is symmetric.  VIII: hearing is intact to voice X: Uvula elevates symmetrically XI: Shoulder shrug is symmetric. XII: tongue is midline without atrophy or fasciculations.  Motor: Decreased tone.  Bilateral shoulder abduction is 3-4/5, bilateral bicep flexion is 4/5, bilateral grip is 5/5.  Bilateral straight leg rise is 1/5, bilateral abduction and abduction is 1/5 bilateral knee flexion and extension is 2/5, bilateral knee dorsiflexion plantarflexion is 5/5 Sensory: Sensation is symmetric to light touch and temperature in the arms and legs.  Patient does have variable soreness to palpation of quadriceps and biceps. Deep Tendon Reflexes: Hypoactive throughout Plantars: Toes are downgoing bilaterally.  Cerebellar: FNF did not show any dysmetria  I have reviewed labs in epic and the results pertinent to this consultation are: AST 525, ALT 252, CK 11,964 and status post fluids 15,891 along with  white blood cell count of 15.5, hemoglobin 8.8, hematocrit of 28.0, TSH of 6.114, BUN 21 and creatinine of 0.66. Unexplained leukocytosis to 15.5, predating stress dose steroid initiation   Malignancy screening CT chest PE protocol 06/09/20  Impression: Subacute progression of weakness with proximal greater than distal involvement is concerning for a myopathy, inflammatory (given her background of rheumatoid arthritis she is at risk for other autoimmune diseases), vs toxic exposure (statin, hydroxycloroquine) vs. Paraneoplastic process.    Recommendations: Additional muscle enzymes -Aldolase, LDH,  Inflammatory workup: -Follow-up free T4  -ESR  -ANA -Anti-Hmg-CoA antibody -Myositis panel   Toxic myopathy considerations - Appreciate primary team holding Plaquenil and statin  Additionally -SLP eval given dysphagia may be related to myopathy  -Malignancy screening -- CT Chest recently completed, will obtain CT Abdomen/Pelvis  Lesleigh Noe MD-PhD [facility] [phone]

## 2020-07-11 NOTE — ED Notes (Signed)
Pt aao4, gcs15, breathing nonlabored with symmetrical chest rise bilaterally. vss on ccm, pt resting in stretcher.  Daughter at bedside, side rails up, call bell in reach.

## 2020-07-11 NOTE — ED Notes (Signed)
Bladder scanner 325 ml

## 2020-07-11 NOTE — ED Triage Notes (Signed)
Pt brought in BIB from [place] home for worsening leg pain that has been on going for the past few weeks and has gotten unbearable today. Left leg pain is worse than right but both are hurting. Pt has history of DVT's and stopped taking blood thinner in September. Pt is minimally ambulatory at nursing home.  Vitals: BP: 136/68 HR: 70 SPO2: 98 RA Temp: 97.46F

## 2020-07-12 ENCOUNTER — Inpatient Hospital Stay (HOSPITAL_COMMUNITY): Payer: Medicare Other

## 2020-07-12 DIAGNOSIS — R7989 Other specified abnormal findings of blood chemistry: Secondary | ICD-10-CM | POA: Diagnosis not present

## 2020-07-12 DIAGNOSIS — R945 Abnormal results of liver function studies: Secondary | ICD-10-CM

## 2020-07-12 DIAGNOSIS — M6089 Other myositis, multiple sites: Secondary | ICD-10-CM | POA: Diagnosis not present

## 2020-07-12 DIAGNOSIS — K5904 Chronic idiopathic constipation: Secondary | ICD-10-CM

## 2020-07-12 DIAGNOSIS — M6282 Rhabdomyolysis: Secondary | ICD-10-CM | POA: Diagnosis not present

## 2020-07-12 DIAGNOSIS — I82409 Acute embolism and thrombosis of unspecified deep veins of unspecified lower extremity: Secondary | ICD-10-CM | POA: Diagnosis not present

## 2020-07-12 LAB — COMPREHENSIVE METABOLIC PANEL
ALT: 347 U/L — ABNORMAL HIGH (ref 0–44)
ALT: 453 U/L — ABNORMAL HIGH (ref 0–44)
AST: 726 U/L — ABNORMAL HIGH (ref 15–41)
AST: 850 U/L — ABNORMAL HIGH (ref 15–41)
Albumin: 1.9 g/dL — ABNORMAL LOW (ref 3.5–5.0)
Albumin: 2.1 g/dL — ABNORMAL LOW (ref 3.5–5.0)
Alkaline Phosphatase: 45 U/L (ref 38–126)
Alkaline Phosphatase: 55 U/L (ref 38–126)
Anion gap: 9 (ref 5–15)
Anion gap: 12 (ref 5–15)
BUN: 20 mg/dL (ref 8–23)
BUN: 24 mg/dL — ABNORMAL HIGH (ref 8–23)
CO2: 23 mmol/L (ref 22–32)
CO2: 23 mmol/L (ref 22–32)
Calcium: 8 mg/dL — ABNORMAL LOW (ref 8.9–10.3)
Calcium: 8.3 mg/dL — ABNORMAL LOW (ref 8.9–10.3)
Chloride: 103 mmol/L (ref 98–111)
Chloride: 102 mmol/L (ref 98–111)
Creatinine, Ser: 0.63 mg/dL (ref 0.44–1.00)
Creatinine, Ser: 0.6 mg/dL (ref 0.44–1.00)
GFR, Estimated: >60 mL/min (ref >60)
GFR, Estimated: >60 mL/min (ref >60)
Glucose, Bld: 138 mg/dL — ABNORMAL HIGH (ref 70–99)
Glucose, Bld: 183 mg/dL — ABNORMAL HIGH (ref 70–99)
Potassium: 4.3 mmol/L (ref 3.5–5.1)
Potassium: 4.1 mmol/L (ref 3.5–5.1)
Sodium: 135 mmol/L (ref 135–145)
Sodium: 137 mmol/L (ref 135–145)
Total Bilirubin: 0.7 mg/dL (ref 0.3–1.2)
Total Bilirubin: 0.3 mg/dL (ref 0.3–1.2)
Total Protein: 4.3 g/dL — ABNORMAL LOW (ref 6.5–8.1)
Total Protein: 4.7 g/dL — ABNORMAL LOW (ref 6.5–8.1)

## 2020-07-12 LAB — CBC
HCT: 26.9 % — ABNORMAL LOW (ref 36.0–46.0)
HCT: 28.2 % — ABNORMAL LOW (ref 36.0–46.0)
Hemoglobin: 8.5 g/dL — ABNORMAL LOW (ref 12.0–15.0)
Hemoglobin: 9.2 g/dL — ABNORMAL LOW (ref 12.0–15.0)
MCH: 32.7 pg (ref 26.0–34.0)
MCH: 33.5 pg (ref 26.0–34.0)
MCHC: 31.6 g/dL (ref 30.0–36.0)
MCHC: 32.6 g/dL (ref 30.0–36.0)
MCV: 103.5 fL — ABNORMAL HIGH (ref 80.0–100.0)
MCV: 102.5 fL — ABNORMAL HIGH (ref 80.0–100.0)
Platelets: 203 10*3/uL (ref 150–400)
Platelets: 240 10*3/uL (ref 150–400)
RBC: 2.6 MIL/uL — ABNORMAL LOW (ref 3.87–5.11)
RBC: 2.75 MIL/uL — ABNORMAL LOW (ref 3.87–5.11)
RDW: 18.6 % — ABNORMAL HIGH (ref 11.5–15.5)
RDW: 19.1 % — ABNORMAL HIGH (ref 11.5–15.5)
WBC: 23.2 10*3/uL — ABNORMAL HIGH (ref 4.0–10.5)
WBC: 37.5 10*3/uL — ABNORMAL HIGH (ref 4.0–10.5)
nRBC: 0.1 % (ref 0.0–0.2)
nRBC: 0.1 % (ref 0.0–0.2)

## 2020-07-12 LAB — CK
Total CK: 23406 U/L — ABNORMAL HIGH (ref 38–234)
Total CK: 31948 U/L — ABNORMAL HIGH (ref 38–234)

## 2020-07-12 LAB — HEPATITIS PANEL, ACUTE
HCV Ab: NONREACTIVE
Hep A IgM: NONREACTIVE
Hep B C IgM: NONREACTIVE
Hepatitis B Surface Ag: NONREACTIVE

## 2020-07-12 LAB — GLUCOSE, CAPILLARY
Glucose-Capillary: 262 mg/dL — ABNORMAL HIGH (ref 70–99)
Glucose-Capillary: 162 mg/dL — ABNORMAL HIGH (ref 70–99)
Glucose-Capillary: 169 mg/dL — ABNORMAL HIGH (ref 70–99)

## 2020-07-12 LAB — ALDOLASE: Aldolase: 77 U/L — ABNORMAL HIGH (ref 3.3–10.3)

## 2020-07-12 LAB — MYOGLOBIN, URINE: Myoglobin, Ur: 285 ng/mL — ABNORMAL HIGH (ref 0–13)

## 2020-07-12 LAB — MYOGLOBIN, SERUM
Myoglobin: 15173 ng/mL — ABNORMAL HIGH (ref 25–58)
Myoglobin: 16784 ng/mL — ABNORMAL HIGH (ref 25–58)

## 2020-07-12 LAB — GAMMA GT: GGT: 17 U/L (ref 7–50)

## 2020-07-12 LAB — SEDIMENTATION RATE: Sed Rate: 69 mm/hr — ABNORMAL HIGH (ref 0–22)

## 2020-07-12 LAB — PROTIME-INR
INR: 1.6 — ABNORMAL HIGH (ref 0.8–1.2)
Prothrombin Time: 18.6 seconds — ABNORMAL HIGH (ref 11.4–15.2)

## 2020-07-12 LAB — APTT: aPTT: 29 seconds (ref 24–36)

## 2020-07-12 LAB — C-REACTIVE PROTEIN: CRP: 1.6 mg/dL — ABNORMAL HIGH (ref <1.0)

## 2020-07-12 LAB — T3, FREE: T3, Free: 2 pg/mL (ref 2.0–4.4)

## 2020-07-12 MED ORDER — HEPARIN (PORCINE) 25000 UT/250ML-% IV SOLN: 950.0000 [IU]/h | INTRAVENOUS | Status: DC

## 2020-07-12 MED ORDER — OXYCODONE HCL 5 MG PO TABS
5.0000 mg | ORAL_TABLET | ORAL | Status: DC | PRN
  Administered 2020-07-12 – 2020-07-14 (×8): 5 mg via ORAL
  Filled 2020-07-12 (×10): qty 1

## 2020-07-12 MED ORDER — IOHEXOL 300 MG/ML  SOLN
100.0000 mL | Freq: Once | INTRAMUSCULAR | Status: AC | PRN
  Administered 2020-07-12: 100 mL via INTRAVENOUS

## 2020-07-12 MED ORDER — TRAMADOL HCL 50 MG PO TABS
50.0000 mg | ORAL_TABLET | Freq: Four times a day (QID) | ORAL | Status: DC | PRN
  Administered 2020-07-13 (×2): 50 mg via ORAL
  Filled 2020-07-12 (×2): qty 1

## 2020-07-12 MED ORDER — POLYETHYLENE GLYCOL 3350 17 G PO PACK: 17.0000 g | PACK | Freq: Every day | ORAL | Status: DC | PRN

## 2020-07-12 MED ORDER — SODIUM CHLORIDE 0.9 % IV SOLN
1000.0000 mg | Freq: Every day | INTRAVENOUS | Status: DC
  Administered 2020-07-13 – 2020-07-17 (×4): 1000 mg via INTRAVENOUS
  Filled 2020-07-12 (×7): qty 8

## 2020-07-12 MED ORDER — PANTOPRAZOLE SODIUM 40 MG IV SOLR
40.0000 mg | Freq: Two times a day (BID) | INTRAVENOUS | Status: DC
  Administered 2020-07-12 – 2020-07-13 (×4): 40 mg via INTRAVENOUS
  Filled 2020-07-12 (×4): qty 40

## 2020-07-12 MED ORDER — PHYTONADIONE 5 MG PO TABS
5.0000 mg | ORAL_TABLET | Freq: Every day | ORAL | Status: AC
  Administered 2020-07-12 – 2020-07-14 (×3): 5 mg via ORAL
  Filled 2020-07-12 (×3): qty 1

## 2020-07-12 NOTE — Progress Notes (Signed)
Lower extremity venous has been completed.   Preliminary results in CV Proc.   Abram Sander 07/12/2020 2:53 PM

## 2020-07-12 NOTE — Progress Notes (Signed)
[facility] Progress Note  Patient: Patricia Villegas    GLO:[id number]  DOA: 07/19/2020     Date of Service: the patient was seen and examined on 07/12/2020  Brief hospital course: Past medical history of RA; [name]; DM; HTN; [name]; and CAD s/p CABG presenting with leg pain.  Found to have severe rhabdomyolysis along with elevated LFT.  Neurology was consulted.  GI was consulted for melena history. Currently plan is follow-up on further work-up and treatment.  Assessment and Plan: 1.  Nontraumatic rhabdomyolysis Presumed myositis Generalized weakness of both upper and lower extremity primarily proximal along with pain in lower extremity. CK continues to trend up despite IV LR. Patient appears to be volume overloading. Patient was on Zocor currently on hold. Hepatic function panel also shows worsening LFT likely secondary to myositis. Neurology was consulted. May require muscle biopsy.  Following up with neuro recommendation. Neuro would like to start pulse dose of steroids. On chronic prednisone secondary to history of rheumatoid arthritis.  Currently on stress dose steroids.  2.  Recent DVT Diagnosed in October 2021. Started on Eliquis. Currently reports melena. Anticoagulation is on hold for now until GI work-up is completed.  3.  Acute on chronic anemia with melena Acute on chronic blood loss anemia. Recently hospitalized for anemia underwent EGD found to have duodenal ulcer. Continuing IV PPI. Discontinue Eliquis for now. If continues to have drop in hemoglobin will require transfusion for hemoglobin less than 7. May require IVC filter if Doppler is still positive.  4.  Urinary retention Monitor for now.  5.  Chronic diastolic CHF Essential hypertension  CAD SP CABG Monitor for volume overload. Currently no evidence of ACS.  6.  Rheumatoid arthritis Holding Plaquenil.  Not on methotrexate.  Body mass index is 23.57 kg/m.    Interventions:       Diet: Cardiac  diet DVT Prophylaxis: SCD, pharmacological prophylaxis contraindicated due to Concern for GI bleed      Advance goals of care discussion: DNR  Family Communication: family was present at bedside, at the time of interview.   The pt provided permission to discuss medical plan with the family. Opportunity was given to ask question and all questions were answered satisfactorily.   Disposition:  Status is: Inpatient  Remains inpatient appropriate because:IV treatments appropriate due to intensity of illness or inability to take PO   Dispo: The patient is from: Home              Anticipated d/c is to: SNF              Anticipated d/c date is: 3 days              Patient currently is not medically stable to d/c.        Subjective: No nausea no vomiting.  Reports melena.  No abdominal pain.  Reports distention.  Minimal oral intake.  No fever no chills.  No chest pain.  Physical Exam:  General: Appear in mild distress, no Rash; Oral Mucosa Clear, moist. no Abnormal Neck Mass Or lumps, Conjunctiva normal  Cardiovascular: S1 and S2 Present, no Murmur, Respiratory: increased respiratory effort, Bilateral Air entry present and bilateral  Crackles, no wheezes Abdomen: Bowel Sound present, Soft and mild pressuure no tenderness Extremities: bilateral  Pedal edema Neurology: alert and oriented to time, place, and person affect appropriate. no new focal deficit proximal muscle weakness bilateral upper and lower extremity Gait not checked due to patient safety concerns  Vitals:  07/12/20 0000 07/12/20 0100 07/12/20 0449 07/12/20 1100  BP: (!) 133/58 (!) 141/66 (!) 142/56 115/77  Pulse: 66 68 67 66  Resp: 13 16 17 17   Temp:  97.7 F (36.5 C) 97.8 F (36.6 C) 98.3 F (36.8 C)  TempSrc:  Oral  Oral  SpO2: 99% 98% 98% 99%  Weight:      Height:       No intake or output data in the 24 hours ending 07/12/20 1700 Filed Weights   07/10/2020 0031  Weight: 66.2 kg    Data Reviewed: I  have personally reviewed and interpreted daily labs, tele strips, imagings as discussed above. I reviewed all nursing notes, pharmacy notes, vitals, pertinent old records I have discussed plan of care as described above with RN and patient/family.  CBC: Recent Labs  Lab 07/06/20 0221 08/02/2020 0111 07/12/20 0412  WBC 8.9 15.5* 23.2*  NEUTROABS 6.9  --   --   HGB 9.1* 8.8* 8.5*  HCT 28.8* 28.0* 26.9*  MCV 103.6* 105.3* 103.5*  PLT 156 211 741   Basic Metabolic Panel: Recent Labs  Lab 07/06/20 0221 07/14/2020 0111 07/12/2020 0459 08/03/2020 1755 07/12/20 0412  NA 142 141 140 135 135  K 4.2 4.0 4.0 4.5 4.3  CL 109 106 106 104 103  CO2 26 26 25 22 23   GLUCOSE 107* 106* 99 120* 138*  BUN 15 23 21 17 20   CREATININE 0.90 0.65 0.66 0.62 0.63  CALCIUM 8.3* 8.4* 8.3* 8.1* 8.0*  MG  --  2.2  --   --   --     Studies: CT ABDOMEN PELVIS W CONTRAST  Result Date: 07/12/2020 CLINICAL DATA:  Abdominal distention EXAM: CT ABDOMEN AND PELVIS WITH CONTRAST TECHNIQUE: Multidetector CT imaging of the abdomen and pelvis was performed using the standard protocol following bolus administration of intravenous contrast. CONTRAST:  165mL OMNIPAQUE IOHEXOL 300 MG/ML  SOLN COMPARISON:  None. FINDINGS: Lower chest: Trace bilateral pleural effusions. Dependent atelectasis. Densely calcified mitral valve. Hepatobiliary: Small gallstones layering within the gallbladder. No focal hepatic abnormality. Pancreas: No focal abnormality or ductal dilatation. Spleen: No focal abnormality.  Normal size. Adrenals/Urinary Tract: Small cyst off the upper pole of the right kidney. No hydronephrosis. No renal or ureteral stones. Urinary bladder and adrenal glands unremarkable. Stomach/Bowel: Few scattered left colonic diverticula. No active diverticulitis. Stomach and small bowel decompressed. No bowel obstruction. Vascular/Lymphatic: Aortic atherosclerosis. No evidence of aneurysm or adenopathy. Reproductive: Prior hysterectomy.   No adnexal masses. Other: No free fluid or free air. Musculoskeletal: Bilateral hip replacements. Postoperative changes in the lower lumbar spine. Mild compression fracture through the inferior endplate of L1 and superior endplate of O87. IMPRESSION: Trace bilateral pleural effusions.  Bibasilar atelectasis. Cholelithiasis. Aortic atherosclerosis. No acute findings in the abdomen or pelvis. Electronically Signed   By: Rolm Baptise M.D.   On: 07/12/2020 02:14   VAS [place] LOWER EXTREMITY VENOUS (DVT)  Result Date: 07/12/2020  Lower Venous DVT Study Indications: Follow up dvt.  Comparison Study: 07/04/20 previous Performing Technologist: Abram Sander RVS  Examination Guidelines: A complete evaluation includes B-mode imaging, spectral Doppler, color Doppler, and power Doppler as needed of all accessible portions of each vessel. Bilateral testing is considered an integral part of a complete examination. Limited examinations for reoccurring indications may be performed as noted. The reflux portion of the exam is performed with the patient in reverse Trendelenburg.  +---------+---------------+---------+-----------+----------+--------------+  RIGHT     Compressibility Phasicity Spontaneity Properties Thrombus Aging  +---------+---------------+---------+-----------+----------+--------------+  CFV       Full            Yes       Yes                                    +---------+---------------+---------+-----------+----------+--------------+  SFJ       Full                                                             +---------+---------------+---------+-----------+----------+--------------+  FV Prox   Full                                                             +---------+---------------+---------+-----------+----------+--------------+  FV Mid    Full                                                             +---------+---------------+---------+-----------+----------+--------------+  FV Distal Full                                                              +---------+---------------+---------+-----------+----------+--------------+  PFV       Full                                                             +---------+---------------+---------+-----------+----------+--------------+  POP       Full            Yes       Yes                                    +---------+---------------+---------+-----------+----------+--------------+  PTV       Full                                                             +---------+---------------+---------+-----------+----------+--------------+  PERO      Full                                                             +---------+---------------+---------+-----------+----------+--------------+   +---------+---------------+---------+-----------+----------+-----------------+  LEFT      Compressibility Phasicity Spontaneity Properties Thrombus Aging     +---------+---------------+---------+-----------+----------+-----------------+  CFV       Full            Yes       Yes                                       +---------+---------------+---------+-----------+----------+-----------------+  SFJ       Full                                                                +---------+---------------+---------+-----------+----------+-----------------+  FV Prox   Full                                                                +---------+---------------+---------+-----------+----------+-----------------+  FV Mid    Full                                                                +---------+---------------+---------+-----------+----------+-----------------+  FV Distal Full                                                                +---------+---------------+---------+-----------+----------+-----------------+  PFV       Full                                                                +---------+---------------+---------+-----------+----------+-----------------+  POP       Full            Yes        Yes                                       +---------+---------------+---------+-----------+----------+-----------------+  PTV       None                                             Age Indeterminate  +---------+---------------+---------+-----------+----------+-----------------+  PERO      None  Age Indeterminate  +---------+---------------+---------+-----------+----------+-----------------+  Gastroc   None                                             Age Indeterminate  +---------+---------------+---------+-----------+----------+-----------------+     Summary: RIGHT: - There is no evidence of deep vein thrombosis in the lower extremity.  - No cystic structure found in the popliteal fossa.  LEFT: - Findings consistent with age indeterminate deep vein thrombosis involving the left posterior tibial veins, left peroneal veins, and left gastrocnemius veins. - No cystic structure found in the popliteal fossa.  *See table(s) above for measurements and observations. Electronically signed by Curt Jews MD on 07/12/2020 at 3:05:07 PM.    Final     Scheduled Meds:  allopurinol  200 mg Oral Daily   docusate sodium  100 mg Oral BID   folic acid  2 mg Oral Daily   hydrocortisone sod succinate (SOLU-CORTEF) inj  50 mg Intravenous Q6H   insulin aspart  0-9 Units Subcutaneous TID WC   insulin glargine  10 Units Subcutaneous QPM   liver oil-zinc oxide  1 application Topical TID   melatonin  3 mg Oral QHS   metoCLOPramide  5 mg Oral QHS   metoprolol tartrate  12.5 mg Oral BID   pantoprazole (PROTONIX) IV  40 mg Intravenous Q12H   phytonadione  5 mg Oral Daily   Continuous Infusions:  lactated ringers 200 mL/hr at 07/12/20 1002   PRN Meds: acetaminophen **OR** acetaminophen, bisacodyl, hydrALAZINE, morphine injection, ondansetron **OR** ondansetron (ZOFRAN) IV, oxyCODONE, polyethylene glycol, senna-docusate, tiZANidine, traMADol  Time spent: 35  minutes  Author: Berle Mull, MD [facility] 07/12/2020 5:00 PM  To reach On-call, see care teams to locate the attending and reach out via [email or web address]. Between 7PM-7AM, please contact night-coverage If you still have difficulty reaching the attending provider, please page the [facility] (Director on Call) for [facility] on amion for assistance.

## 2020-07-12 NOTE — Progress Notes (Signed)
ANTICOAGULATION CONSULT NOTE - Initial Consult  Pharmacy Consult for Heparin Indication: Conversion from apixaban to heparin for recent DVT in setting of impending procedure.   No Known Allergies  Patient Measurements: Height: 5\' 6"  (167.6 cm) Weight: 66.2 kg (146 lb) IBW/kg (Calculated) : 59.3 Heparin Dosing Weight: 66.2  Vital Signs: Temp: 97.8 F (36.6 C) (11/03 0449) Temp Source: Oral (11/03 0100) BP: 142/56 (11/03 0449) Pulse Rate: 67 (11/03 0449)  Labs: Recent Labs    07/17/2020 0111 08/08/2020 0111 07/31/2020 0459 08/07/2020 1755 07/12/20 0412  HGB 8.8*  --   --   --  8.5*  HCT 28.0*  --   --   --  26.9*  PLT 211  --   --   --  203  APTT  --   --   --   --  29  LABPROT  --   --   --   --  18.6*  INR  --   --   --   --  1.6*  CREATININE 0.65   < > 0.66 0.62 0.63  CKTOTAL 11,964*   < > 15,891* 17,287* 23,406*   < > = values in this interval not displayed.    Estimated Creatinine Clearance: 49.9 mL/min (by C-G formula based on SCr of 0.63 mg/dL).   Medical History: Past Medical History:  Diagnosis Date  . Anemia   . Anemia 06/14/2020  . Anxiety   . Arthritis   . Blood transfusion   . Chest pain    a. Lexiscan Myoview 12/12:  EF 75%, no ischemia or scar;  b.  Echo 12/12:  EF 60-65%, no sig valvular abnormalities   . Closed left fibular fracture    Casted   . Complication of anesthesia 1998   Unable to arouse fully, respiratory depression, pt. reports that the family was told that she would need a trach., but then she stabilized & didn't need it   . Coronary artery disease    sees Dr Aundra Dubin every 6 months  . Depression   . Diabetes mellitus    Greater than 20 years type 2   . GERD (gastroesophageal reflux disease)   . H/O hiatal hernia   . Hypercholesteremia   . Hypertension    Greater than 20 years  . Lumbar disc disease    S/p surgery x4;  Sciatica   . Neuropathy due to secondary diabetes ([place])    Hx: of  . Parastomal hernia of ileal conduit 2009  .  Pneumonia    s/p CABG  . [name] (peripheral vascular disease) ([facility])    Stable claudication. Arteriogram 07/2009 no significant aortoiliac disease, has bilateral SFA occlusions with distal reconstitution   . Rheumatoid arthritis ([place])    Dr Berna Bue @ [facility]  . Transfusion reaction 1960   Convulsions (? seizure)  . UTI (lower urinary tract infection) 01/03/15    [name]; [name]; DM; HTN; [name]; and CAD s/p CABG presenting with leg pain. Patient with possible myositis with possible procedure due. Started on apixaban recently within last few weeks for acute left leg DVT. Patient received last dose of apixaban at ~0930 this AM. Will start heparin drip 12 hours after last dose of apixaban.   Goal of Therapy:  Heparin level 0.3-0.7 APTT 66-102 sec Monitor platelets by anticoagulation protocol: Yes   Plan:  D/C Apixaban Start heparin drip at 2130 at a rate of 950 units/hr, no bolus APTT 8  hours after start of drip Daily Heparin level and APTT (until HL correlate with APTT given DOAC use).    Magin Balbi A. Levada Dy, PharmD, BCPS, FNKF Clinical Pharmacist [place] Please utilize Amion for appropriate phone number to reach the unit pharmacist ([place])   07/12/2020,11:46 AM

## 2020-07-12 NOTE — Progress Notes (Signed)
Stool for C-diff sent to Lab. Lab called and cancel cause stool was too firm.

## 2020-07-12 NOTE — Consult Note (Addendum)
[facility] Consult: 12:46 PM 07/12/2020  LOS: 1 day    Referring Provider: Dr Berle Mull  Primary Care Physician:  Tisovec, Fransico Him, MD Primary Gastroenterologist:  Dr. Havery Moros.      Reason for Consultation: Recurrent dark stool.   HPI: Patricia Villegas is a 85 y.o. female.  PMH CAD.  CABG.  Hypertension.  DM 2, insulin requiring.. Diabetic polyneuropathy. CHF-D.  Chronic normocytic anemia. Rheumatoid arthritis on methotrexate weekly and twice daily Plaquenil, low-dose prednisone.. Gout. CKD 2.  Chronic back pain, status post multiple back surgeries.  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 Peripheral vascular disease, claudication.   Admission 10/6 -06/18/2020 admission for symptomatic anemia, melena. Seen in consultation by GI. Hgb 7.3, and melenic stool.  Received 2 PRBCs,  Hgb 9.9 at discharge. Plan was to resume aspirin 1 week following discharge.  Anemia studies revealed no iron, B12 or folate deficiency.   Discharged home with home PT. On 06/20/2020, because biopsy confirmed H. pylori, prescription for 14 days of amoxicillin 1 g bid, clarithromycin 500 mg bid, Flagyl 500 mg bid, continue Protonix 40 mg twice daily was sent to her pharmacy. 06/17/2020 EGD.  2 cm HH.  Mild gastric erythema, biopsied.  2, nonbleeding clean-based duodenal ulcers.  The ulcers were the likely cause of her bleeding.  Being clean-based felt low risk for recurrent bleeding.  Pathology confirmed H. pylori gastritis. 06/17/2020 colonoscopy.  Internal hemorrhoids.  Sigmoid diverticulosis.  12 mm polyp at sigmoid removed, site endoclipped to prevent potential bleeding.  Pathology: TVA, no HGD.    Recurrent admission 10/23 -07/06/2020. Issues addressed include weakness. Acute left leg DVT for which she was started on Eliquis. AKI  improved with hydration.  Hgb during admit ranged 10.2 -9.1. Platelets dipped as low as 121 but normalized to 156 by the day of discharge.  Admitted yesterday from nursing home with progressive physical decline, core and lower extremity weakness, paresthesias and burning left >> right leg pain, bil upper extremity weakness. Swelling in bil LE has progressed to swelling in bil UEs.  Today she has even developed difficulty opening her mouth which she says feels like "locked jaw".  Labs consistent with rhabdomyolysis but kidney fx preserved.  . CK total 11.964 >> 23,406. Myoglobin 15,173 >> 16,784 Hgb 8.5. MCV 103. Platelets 203. WBCs 23.2. INR 1.6. T bili 0.7. Alkaline phosphatase 45. AST/ALT 525/252 >> 726/347. TSH 6.1. CTAP with contrast: Reveals trace pleural effusions and atelectasis. Small, benign gallbladder stones. Normal liver parenchyma. Normal pancreatic and intra-/extrahepatic ducts. Cholelithiasis. Aortic atherosclerosis.  Neurology consulted for progression of weakness concerning for myopathy versus toxin/meds exposure versus paraneoplastic process. Plaquenil and statin are on hold and multiple autoimmune marker tests are pending. Ultrasound liver Dopplers: pndg  Dark stools, small volume/soft 1 to 3 x daily for several weeks.  SNF staff slow to clean up stools and w bed bound pt, she is unable to clean peri-rectal area and consequently has erythema and burning in peri-rectal skin.  Completed H Pylori Rx, continues on bid Protonix.  Poor appetite.  No nausea.  Weight gain from 138 pre hip surgery to current 146#.  Dose of MTX was decreased from 10 mg/day to 1 mg/day after hip surgery.          Past Medical History:  Diagnosis Date  . Anemia   . Anemia 06/14/2020  . Anxiety   . Arthritis   . Blood transfusion   . Chest pain    a. Lexiscan Myoview 12/12:  EF 75%, no ischemia or scar;  b.  Echo 12/12:  EF 60-65%, no sig valvular abnormalities   . Closed left fibular fracture     Casted   . Complication of anesthesia 1998   Unable to arouse fully, respiratory depression, pt. reports that the family was told that she would need a trach., but then she stabilized & didn't need it   . Coronary artery disease    sees Dr Aundra Dubin every 6 months  . Depression   . Diabetes mellitus    Greater than 20 years type 2   . GERD (gastroesophageal reflux disease)   . H/O hiatal hernia   . Hypercholesteremia   . Hypertension    Greater than 20 years  . Lumbar disc disease    S/p surgery x4;  Sciatica   . Neuropathy due to secondary diabetes ([place])    Hx: of  . Parastomal hernia of ileal conduit 2009  . Pneumonia    s/p CABG  . [name] (peripheral vascular disease) ([facility])    Stable claudication. Arteriogram 07/2009 no significant aortoiliac disease, has bilateral SFA occlusions with distal reconstitution   . Rheumatoid arthritis ([place])    Dr Berna Bue @ [facility]  . Transfusion reaction 1960   Convulsions (? seizure)  . UTI (lower urinary tract infection) 01/03/15    Past Surgical History:  Procedure Laterality Date  . ABDOMINAL HYSTERECTOMY    . BACK SURGERY     4 back operations most recently 1995, 1998  . BIOPSY  06/17/2020   Procedure: BIOPSY;  Surgeon: Yetta Flock, MD;  Location: [facility] ENDOSCOPY;  Service: Gastroenterology;;  . CARDIAC CATHETERIZATION     Hx: of 05/14/13  . CATARACT EXTRACTION W/ INTRAOCULAR LENS  IMPLANT, BILATERAL     Hx: of  . COLONOSCOPY     Hx; of  . COLONOSCOPY WITH PROPOFOL N/A 06/17/2020   Procedure: COLONOSCOPY WITH PROPOFOL;  Surgeon: Yetta Flock, MD;  Location: [facility] ENDOSCOPY;  Service: Gastroenterology;  Laterality: N/A;  . CORONARY ARTERY BYPASS GRAFT N/A 05/24/2013   Procedure: CORONARY ARTERY BYPASS GRAFTING (CABG);  Surgeon: Melrose Nakayama, MD;  Location: [place];  Service: Open Heart Surgery;  Laterality: N/A;  . DILATION AND CURETTAGE OF UTERUS    . ESOPHAGOGASTRODUODENOSCOPY (EGD) WITH PROPOFOL N/A 06/17/2020     Procedure: ESOPHAGOGASTRODUODENOSCOPY (EGD) WITH PROPOFOL;  Surgeon: Yetta Flock, MD;  Location: [place];  Service: Gastroenterology;  Laterality: N/A;  . HEMOSTASIS CLIP PLACEMENT  06/17/2020   Procedure: HEMOSTASIS CLIP PLACEMENT;  Surgeon: Yetta Flock, MD;  Location: [facility] ENDOSCOPY;  Service: Gastroenterology;;  . HERNIA REPAIR  2009  . LEFT HEART CATHETERIZATION WITH CORONARY ANGIOGRAM N/A 05/14/2013   Procedure: LEFT HEART CATHETERIZATION WITH CORONARY ANGIOGRAM;  Surgeon: Larey Dresser, MD;  Location: [facility] CATH LAB;  Service: Cardiovascular;  Laterality: N/A;  . POLYPECTOMY  06/17/2020   Procedure: POLYPECTOMY;  Surgeon: Yetta Flock, MD;  Location: [facility] ENDOSCOPY;  Service: Gastroenterology;;  . TONSILLECTOMY    . TOTAL HIP ARTHROPLASTY Left 01/10/2015   Procedure: TOTAL  HIP ARTHROPLASTY ANTERIOR APPROACH;  Surgeon: Melrose Nakayama, MD;  Location: [place];  Service: Orthopedics;  Laterality: Left;  . TOTAL HIP ARTHROPLASTY Right 05/30/2020   Procedure: RIGHT TOTAL HIP ARTHROPLASTY ANTERIOR APPROACH;  Surgeon: Melrose Nakayama, MD;  Location: [facility] ORS;  Service: Orthopedics;  Laterality: Right;    Prior to Admission medications   Medication Sig Start Date End Date Taking? Authorizing Provider  allopurinol (ZYLOPRIM) 100 MG tablet Take 200 mg by mouth daily.  10/26/19  Yes [provider]  apixaban (ELIQUIS) 5 MG TABS tablet 10 mg twice daily for 7 days followed by 5 mg twice daily. Patient taking differently: Take 10 mg by mouth 2 (two) times daily.  07/06/20  Yes Dahal, Marlowe Aschoff, MD  folic acid (FOLVITE) 1 MG tablet Take 2 mg by mouth daily. 05/10/20  Yes [provider]  hydroxychloroquine (PLAQUENIL) 200 MG tablet Take 200 mg by mouth 2 (two) times daily. 10/26/19  Yes [provider]  [place] (DERMACLOUD EX) Apply 1 application topically 3 (three) times daily. Every shift to buttocks   Yes [provider]  insulin aspart  (NOVOLOG) 100 UNIT/ML injection Inject 0-5 Units into the skin at bedtime. Patient taking differently: Inject 0-6 Units into the skin with breakfast, with lunch, and with evening meal. 0-150 0 units 151-200 1 unit 201-250 2 units 251-300 3 units 301-350 4 units 351-400 5 units 401+ 6 units & call MD 07/06/20  Yes Dahal, Marlowe Aschoff, MD  insulin aspart (NOVOLOG) 100 UNIT/ML injection Inject 0-6 Units into the skin 3 (three) times daily with meals. Patient taking differently: Inject 0-5 Units into the skin at bedtime as needed for high blood sugar. 0-200 0 units 201-250 2 units 251-300 3 units 301-350 4 units 351-400 5 units 401+ call MD 07/06/20  Yes Dahal, Marlowe Aschoff, MD  insulin glargine (LANTUS) 100 UNIT/ML injection Inject 0.05 mLs (5 Units total) into the skin every evening. Patient taking differently: Inject 10 Units into the skin every evening.  07/06/20  Yes Dahal, Marlowe Aschoff, MD  melatonin 3 MG TABS tablet Take 1 tablet (3 mg total) by mouth at bedtime. 07/06/20  Yes Dahal, Marlowe Aschoff, MD  metoCLOPramide (REGLAN) 5 MG tablet Take 5 mg by mouth at bedtime.   Yes [provider]  metoprolol tartrate (LOPRESSOR) 25 MG tablet Take 0.5 tablets (12.5 mg total) by mouth 2 (two) times daily. 07/19/19  Yes Sande Rives E, PA-C  pantoprazole (PROTONIX) 40 MG tablet Take 1 tablet (40 mg total) by mouth 2 (two) times daily. Take twice daily for one month then continue with once daily. 06/18/20 07/18/20 Yes Arrien, Jimmy Picket, MD  predniSONE (DELTASONE) 5 MG tablet Take 5 mg by mouth daily with breakfast.    Yes [provider]  senna-docusate (SENOKOT-S) 8.6-50 MG tablet Take 1 tablet by mouth daily as needed for mild constipation.   Yes [provider]  simvastatin (ZOCOR) 80 MG tablet Take 80 mg by mouth at bedtime.     Yes [provider]  tiZANidine (ZANAFLEX) 2 MG tablet Take 1-2 tablets (2-4 mg total) by mouth every 6 (six) hours as needed for muscle  spasms. Patient taking differently: Take 2 mg by mouth every 6 (six) hours as needed for muscle spasms.  05/30/20 05/30/21 Yes Loni Dolly, PA-C  ONE TOUCH ULTRA TEST test strip  06/24/13   [provider]    Scheduled Meds: . allopurinol  200 mg Oral Daily  . docusate sodium  100 mg Oral BID  .  folic acid  2 mg Oral Daily  . hydrocortisone sod succinate (SOLU-CORTEF) inj  50 mg Intravenous Q6H  . insulin aspart  0-9 Units Subcutaneous TID WC  . insulin glargine  10 Units Subcutaneous QPM  . liver oil-zinc oxide  1 application Topical TID  . melatonin  3 mg Oral QHS  . metoCLOPramide  5 mg Oral QHS  . metoprolol tartrate  12.5 mg Oral BID  . pantoprazole (PROTONIX) IV  40 mg Intravenous Q12H   Infusions: . heparin    . lactated ringers 200 mL/hr at 07/12/20 1002   PRN Meds: acetaminophen **OR** acetaminophen, bisacodyl, hydrALAZINE, morphine injection, ondansetron **OR** ondansetron (ZOFRAN) IV, oxyCODONE, polyethylene glycol, senna-docusate, tiZANidine, traMADol   Allergies as of 08/07/2020  . (No Known Allergies)    Family History  Problem Relation Age of Onset  . Coronary artery disease Mother 21  . Diabetes Mother   . Stroke Mother   . Heart attack Mother   . Cancer Brother   . Heart disease Brother   . Stroke Brother   . Kidney disease Brother   . Cancer Sister   . Diabetes Brother     Social History   Socioeconomic History  . Marital status: Married    Spouse name: Not on file  . Number of children: 2  . Years of education: 75  . Highest education level: Not on file  Occupational History  . Occupation: Retired    Fish farm manager: RETIRED  Tobacco Use  . Smoking status: Former Smoker    Packs/day: 0.50    Years: 10.00    Pack years: 5.00    Types: Cigarettes    Quit date: 09/09/1996    Years since quitting: 23.8  . Smokeless tobacco: Current User    Types: Chew  Vaping Use  . Vaping Use: Never used  Substance and Sexual Activity  . Alcohol use:  No  . Drug use: No  . Sexual activity: Not Currently  Other Topics Concern  . Not on file  Social History Narrative   Married and has two children    Social Determinants of Health   Financial Resource Strain:   . Difficulty of Paying Living Expenses: Not on file  Food Insecurity:   . Worried About Charity fundraiser in the Last Year: Not on file  . Ran Out of Food in the Last Year: Not on file  Transportation Needs:   . Lack of Transportation (Medical): Not on file  . Lack of Transportation (Non-Medical): Not on file  Physical Activity:   . Days of Exercise per Week: Not on file  . Minutes of Exercise per Session: Not on file  Stress:   . Feeling of Stress : Not on file  Social Connections:   . Frequency of Communication with Friends and Family: Not on file  . Frequency of Social Gatherings with Friends and Family: Not on file  . Attends Religious Services: Not on file  . Active Member of Clubs or Organizations: Not on file  . Attends Archivist Meetings: Not on file  . Marital Status: Not on file  Intimate Partner Violence:   . Fear of Current or Ex-Partner: Not on file  . Emotionally Abused: Not on file  . Physically Abused: Not on file  . Sexually Abused: Not on file    REVIEW OF SYSTEMS: Constitutional:  Weakness and inablity to move legs, now affecting arms ENT:  No nose bleeds Pulm:  No SOB or cough CV:  No palpitations, no angina.  bil LE swelling into thighs, hips GU:  No hematuria, no frequency GI:  See HPI Heme: Denies excessive bleeding.  With the Eliquis has had a tendency to develop purpura and bruising.   Transfusions: About a month ago, see HPI, received PRBCs for GI blood loss anemia Neuro: No tremors.  No seizures.  See HPI for progressive limb weakness Derm:  No itching.  Burning skin in the perirectal area. Endocrine:  No sweats or chills.  No polyuria or dysuria Immunization: Not queried. Travel:  None in several months.   PHYSICAL  EXAM: Vital signs in last 24 hours: Vitals:   07/12/20 0100 07/12/20 0449  BP: (!) 141/66 (!) 142/56  Pulse: 68 67  Resp: 16 17  Temp: 97.7 F (36.5 C) 97.8 F (36.6 C)  SpO2: 98% 98%   Wt Readings from Last 3 Encounters:  07/21/2020 66.2 kg  07/02/20 64.5 kg  06/18/20 62.9 kg    General: Very pleasant, alert, comfortable, does not look as ill as chart suggests. Head: No facial asymmetry or swelling.  No signs of head trauma. Eyes: Conjunctiva moderately pale.  No icterus.  EOMI Ears: Not hard of hearing Nose: No congestion or discharge Mouth: Very dry oral mucosa's membranes and tongue.  No lesions.  No bleeding.  Tongue midline.  Native dentition in fair repair. Neck: No JVD, no masses, no thyromegaly Lungs: No labored breathing or cough.  Lungs clear bilaterally Heart: RRR with soft/subtle murmur.  S1, S2 audible. Abdomen: Soft, bowel sounds normal but hypoactive.  Not tender.  No HSM, masses, bruits, hernias..   Rectal: Deferred.  There was a small bit of soft dark stool in a specimen container which I hemocculted and was strongly FOBT positive Extremities: Swelling, anasarca of the legs and arms.  Even the imprint of a towel that had been lying between the patient's thighs was evident on the skin. Neurologic: Oriented x3.  Unable to lift her legs but she can flex and extend at the ankles with mostly full strength.  Grip strength diminished.  No tremors or involuntary movements.  Fluid speech. Skin: Purpura across the upper chest and on the arms.   Psych: Calm, pleasant, cooperative.   Intake/Output from previous day: 11/02 0701 - 11/03 0700 In: -  Out: 500 [Urine:500] Intake/Output this shift: No intake/output data recorded.  LAB RESULTS: Recent Labs    07/21/2020 0111 07/12/20 0412  WBC 15.5* 23.2*  HGB 8.8* 8.5*  HCT 28.0* 26.9*  PLT 211 203   BMET Lab Results  Component Value Date   NA 135 07/12/2020   NA 135 07/15/2020   NA 140 07/22/2020   K 4.3  07/12/2020   K 4.5 07/14/2020   K 4.0 07/10/2020   CL 103 07/12/2020   CL 104 07/27/2020   CL 106 07/13/2020   CO2 23 07/12/2020   CO2 22 07/26/2020   CO2 25 08/04/2020   GLUCOSE 138 (H) 07/12/2020   GLUCOSE 120 (H) 07/12/2020   GLUCOSE 99 07/21/2020   BUN 20 07/12/2020   BUN 17 07/25/2020   BUN 21 07/24/2020   CREATININE 0.63 07/12/2020   CREATININE 0.62 07/29/2020   CREATININE 0.66 07/21/2020   CALCIUM 8.0 (L) 07/12/2020   CALCIUM 8.1 (L) 07/29/2020   CALCIUM 8.3 (L) 07/27/2020   LFT Recent Labs    07/21/2020 1031 08/01/2020 1755 07/12/20 0412  PROT 4.6* 4.4* 4.3*  ALBUMIN 2.1* 2.0* 1.9*  AST 525* 609* 726*  ALT 252*  293* 347*  ALKPHOS 46 47 45  BILITOT 0.5 0.8 0.7  BILIDIR 0.2  --   --   IBILI 0.3  --   --    PT/INR Lab Results  Component Value Date   INR 1.6 (H) 07/12/2020   INR 1.0 05/24/2020   INR 1.00 12/29/2014   Hepatitis Panel No results for input(s): HEPBSAG, HCVAB, HEPAIGM, HEPBIGM in the last 72 hours. C-Diff No components found for: CDIFF Lipase  No results found for: LIPASE  Drugs of Abuse  No results found for: LABOPIA, COCAINSCRNUR, LABBENZ, AMPHETMU, THCU, LABBARB   RADIOLOGY STUDIES: CT ABDOMEN PELVIS W CONTRAST  Result Date: 07/12/2020 CLINICAL DATA:  Abdominal distention EXAM: CT ABDOMEN AND PELVIS WITH CONTRAST TECHNIQUE: Multidetector CT imaging of the abdomen and pelvis was performed using the standard protocol following bolus administration of intravenous contrast. CONTRAST:  187mL OMNIPAQUE IOHEXOL 300 MG/ML  SOLN COMPARISON:  None. FINDINGS: Lower chest: Trace bilateral pleural effusions. Dependent atelectasis. Densely calcified mitral valve. Hepatobiliary: Small gallstones layering within the gallbladder. No focal hepatic abnormality. Pancreas: No focal abnormality or ductal dilatation. Spleen: No focal abnormality.  Normal size. Adrenals/Urinary Tract: Small cyst off the upper pole of the right kidney. No hydronephrosis. No renal or  ureteral stones. Urinary bladder and adrenal glands unremarkable. Stomach/Bowel: Few scattered left colonic diverticula. No active diverticulitis. Stomach and small bowel decompressed. No bowel obstruction. Vascular/Lymphatic: Aortic atherosclerosis. No evidence of aneurysm or adenopathy. Reproductive: Prior hysterectomy.  No adnexal masses. Other: No free fluid or free air. Musculoskeletal: Bilateral hip replacements. Postoperative changes in the lower lumbar spine. Mild compression fracture through the inferior endplate of L1 and superior endplate of X83. IMPRESSION: Trace bilateral pleural effusions.  Bibasilar atelectasis. Cholelithiasis. Aortic atherosclerosis. No acute findings in the abdomen or pelvis. Electronically Signed   By: Rolm Baptise M.D.   On: 07/12/2020 02:14      IMPRESSION:   *   Acute rhabdomyolysis with ? Myositis/myopathy.  Apparently the plan, per neurology, is to start high-dose Solu-Medrol  *    Rising transaminases. Hypoalbuminemia.   CT with unremarkable liver, biliary tree, pancreatic duct but does reveal uncomplicated gallbladder stones. Awaiting completion of ultrasound liver Doppler. Doubt acute hepatitis but still needs acute hepatitis serologies.   *   Dark, strongly heme positive stools in patient with history gastritis and duodenal ulcers. 06/17/2020 EGD confirmed nonbleeding, clean-based, duodenal ulcers and H. pylori gastritis.  Patient completed H. pylori eradication with Biaxin, amoxicillin, metronidazole and continues on twice daily Protonix 40 mg  *    Macrocytic anemia. Today's Hb 8.5 is overall down from measures of 9.9 a week ago. EGD 1 month ago with findings noted above Colonoscopy 1 month ago with removal of adenomatous polyp and prophylactic Endo clipping of polypectomy site, sigmoid diverticulosis, internal hemorrhoids.  *    Acute left LE DVT, started Eliquis during hospitalization ending 07/06/2020.  *    Coagulopathy. INR 1.6, last checked  and measured 1 on 9/15. Elevated INR can be associated with Eliquis. ? hepatic synthetic dysfunction  *    Intermittent thrombocytopenia, platelets normal now.  *    IDDM.  *    CKD. Currently has normal BUN and creatinine.  *    Protein calorie malnutrition of unscored severity in setting of anorexia and poor p.o. intake  *   RA.   Meds include low-dose prednisone, Plaquenil and methotrexate.  Patient and daughter report the dose of methotrexate was lowered after hip surgery.  PLAN:     *    Give Vit K po x 3 d.    *   Follow LFTs, CBC.    *    Repeat EGD vs enteroscopy to assess state of gastritis and ulcers, defer decision to Dr. Lyndel Safe.  In the meantime agree that patient should be allowed solid food.  *   Leave Protonix 40 mg IV in place for now.  *   Because of the rising WBC count and sepsis criteria, the hospitalist has ordered enteric precautions and testing for C. difficile but it is very unlikely C. difficile is present, she is not having diarrhea, just persistent dark stools which test heme positive.    *   Eliquis and heparin are on hold.   Azucena Freed  07/12/2020, 12:46 PM Phone [phone]      Attending physician's note   I have taken an interval history, reviewed the chart and examined the patient. I agree with the Advanced Practitioner's note, impression and recommendations.   Heme pos stools in pt with a recent EGD 06/17/2020 showing clean-based DUs and HP gastritis.  Rx with triple drug therapy and continued on Protonix. Hb 8.5 (9.9 last week).  No active bleeding.  Neg colon 06/17/2020 except for sig div, significant colonic polyp s/p polypectomy.  Adm from SNF d/t rhabdo. Abn LFTs could be related especially AST. Neg CT AP with contrast this adm for any hepatic abn.  She has asymptomatic cholelithiasis.  DVT on Eliquis.  Plan: -Since there is no active bleeding and she recently had extensive GI WU, would like to hold off on repeat endoscopic eval  (unless there is active bleeding). -Continue Protonix. -Trend CBC, LFTs. (Expect LFTs to gradually get better) -Discussed with daughter. -Advance diet.   Carmell Austria, MD [facility]

## 2020-07-12 NOTE — Progress Notes (Signed)
SLP Cancellation Note  Patient Details Name: Patricia Villegas MRN: [id number] DOB: 01-Jan-1935   Cancelled treatment:       Reason Eval/Treat Not Completed: Patient at procedure or test/unavailable (Per Tu, RN, pt is currently NPO for possible [place]. SLP will follow up. )  Rayder Sullenger I. Hardin Negus, [place], [place] Office number [phone] Pager [place] 07/12/2020, 9:50 AM

## 2020-07-12 NOTE — Progress Notes (Addendum)
Neurology Progress Note  Patient ID: Patricia Villegas is a 85 y.o. with [name] is a 85 y.o. female with history of [name], DVT on Eliquis, rheumatoid arthritis, hypertension, hypercholesterolemia, CAD, recent right total hip arthroplasty, recent GI bleed secondary to H. Pylori infection, anemia, diabetes, coronary artery disease status post CABG  Initially consulted for: c/f myositis given elevated CK  Major interval events/subjective:  - 4 episodes of melanotic stool (1 while I was evaluating patient) - Strength stable; feels generally stronger at the beginning of the day - No headaches or double vision  Exam: Vitals:   07/12/20 0100 07/12/20 0449  BP: (!) 141/66 (!) 142/56  Pulse: 68 67  Resp: 16 17  Temp: 97.7 F (36.5 C) 97.8 F (36.6 C)  SpO2: 98% 98%   Gen: In bed, comfortable  Resp: non-labored breathing, no grossly audible wheezing Cardiac: Perfusing extremities well  Abd: soft, nt Rectal exam -- melanotic stool, small bleeding hemorrhoid  Neuro: MS: Alert awake and oriented CN: Face symmetric, tongue midline, EOMI intact to tracking examiner Motor: Proximally 2/5 in the arms, distally 4/5. In the legs proximally 1/5, distally 4/5 ( a bit improved from day prior)   Pertinent Labs: CK continues to rise  WBC rising Platelets, Hgb stable  Impression: I remain concerned about myositis in this patient, but I am hesitant to start steroids which can worsen ulcers in the setting of ongoing GI bleeding.    Recommendations: -Consult GI for melena and optimizing patient to minimize risk of GI bleeding prior to starting steroids  -Will continue to follow workup sent and patient  I had an extended discussion with the patient and her daughter about the risks and benefits of steroids, with the main benefit being halting an autoimmune process, contributing to her rapid muscle loss, with risks including delirium/psychosis, GI bleeding (which the patient has already been  suffering), hyperglycemia (which can be managed by adjusting insulin).  They are in agreement to start her pending discussion with GI.  Lesleigh Noe MD-PhD [facility] [phone]

## 2020-07-13 ENCOUNTER — Inpatient Hospital Stay (HOSPITAL_COMMUNITY): Payer: Medicare Other

## 2020-07-13 DIAGNOSIS — R7989 Other specified abnormal findings of blood chemistry: Secondary | ICD-10-CM | POA: Diagnosis not present

## 2020-07-13 DIAGNOSIS — M6282 Rhabdomyolysis: Secondary | ICD-10-CM | POA: Diagnosis not present

## 2020-07-13 DIAGNOSIS — M6089 Other myositis, multiple sites: Secondary | ICD-10-CM | POA: Diagnosis not present

## 2020-07-13 LAB — COMPREHENSIVE METABOLIC PANEL
ALT: 465 U/L — ABNORMAL HIGH (ref 0–44)
ALT: 524 U/L — ABNORMAL HIGH (ref 0–44)
AST: 781 U/L — ABNORMAL HIGH (ref 15–41)
AST: 876 U/L — ABNORMAL HIGH (ref 15–41)
Albumin: 1.8 g/dL — ABNORMAL LOW (ref 3.5–5.0)
Albumin: 1.8 g/dL — ABNORMAL LOW (ref 3.5–5.0)
Alkaline Phosphatase: 54 U/L (ref 38–126)
Alkaline Phosphatase: 57 U/L (ref 38–126)
Anion gap: 8 (ref 5–15)
Anion gap: 9 (ref 5–15)
BUN: 25 mg/dL — ABNORMAL HIGH (ref 8–23)
BUN: 27 mg/dL — ABNORMAL HIGH (ref 8–23)
CO2: 23 mmol/L (ref 22–32)
CO2: 23 mmol/L (ref 22–32)
Calcium: 8 mg/dL — ABNORMAL LOW (ref 8.9–10.3)
Calcium: 8 mg/dL — ABNORMAL LOW (ref 8.9–10.3)
Chloride: 107 mmol/L (ref 98–111)
Chloride: 106 mmol/L (ref 98–111)
Creatinine, Ser: 0.67 mg/dL (ref 0.44–1.00)
Creatinine, Ser: 0.68 mg/dL (ref 0.44–1.00)
GFR, Estimated: >60 mL/min (ref >60)
GFR, Estimated: >60 mL/min (ref >60)
Glucose, Bld: 171 mg/dL — ABNORMAL HIGH (ref 70–99)
Glucose, Bld: 203 mg/dL — ABNORMAL HIGH (ref 70–99)
Potassium: 4.2 mmol/L (ref 3.5–5.1)
Potassium: 4.4 mmol/L (ref 3.5–5.1)
Sodium: 138 mmol/L (ref 135–145)
Sodium: 138 mmol/L (ref 135–145)
Total Bilirubin: 0.6 mg/dL (ref 0.3–1.2)
Total Bilirubin: 0.4 mg/dL (ref 0.3–1.2)
Total Protein: 4.3 g/dL — ABNORMAL LOW (ref 6.5–8.1)
Total Protein: 4.3 g/dL — ABNORMAL LOW (ref 6.5–8.1)

## 2020-07-13 LAB — GLUCOSE, CAPILLARY
Glucose-Capillary: 157 mg/dL — ABNORMAL HIGH (ref 70–99)
Glucose-Capillary: 132 mg/dL — ABNORMAL HIGH (ref 70–99)
Glucose-Capillary: 184 mg/dL — ABNORMAL HIGH (ref 70–99)
Glucose-Capillary: 246 mg/dL — ABNORMAL HIGH (ref 70–99)

## 2020-07-13 LAB — CBC
HCT: 25.5 % — ABNORMAL LOW (ref 36.0–46.0)
HCT: 28.7 % — ABNORMAL LOW (ref 36.0–46.0)
Hemoglobin: 8.2 g/dL — ABNORMAL LOW (ref 12.0–15.0)
Hemoglobin: 9.4 g/dL — ABNORMAL LOW (ref 12.0–15.0)
MCH: 33.3 pg (ref 26.0–34.0)
MCH: 33.7 pg (ref 26.0–34.0)
MCHC: 32.2 g/dL (ref 30.0–36.0)
MCHC: 32.8 g/dL (ref 30.0–36.0)
MCV: 103.7 fL — ABNORMAL HIGH (ref 80.0–100.0)
MCV: 102.9 fL — ABNORMAL HIGH (ref 80.0–100.0)
Platelets: 215 10*3/uL (ref 150–400)
Platelets: 207 10*3/uL (ref 150–400)
RBC: 2.46 MIL/uL — ABNORMAL LOW (ref 3.87–5.11)
RBC: 2.79 MIL/uL — ABNORMAL LOW (ref 3.87–5.11)
RDW: 19.2 % — ABNORMAL HIGH (ref 11.5–15.5)
RDW: 19.4 % — ABNORMAL HIGH (ref 11.5–15.5)
WBC: 34.2 10*3/uL — ABNORMAL HIGH (ref 4.0–10.5)
WBC: 28.9 10*3/uL — ABNORMAL HIGH (ref 4.0–10.5)
nRBC: 0.1 % (ref 0.0–0.2)
nRBC: 0.1 % (ref 0.0–0.2)

## 2020-07-13 LAB — MYOGLOBIN, SERUM
Myoglobin: 28006 ng/mL — ABNORMAL HIGH (ref 25–58)
Myoglobin: >30000 ng/mL — ABNORMAL HIGH (ref 25–58)

## 2020-07-13 LAB — PROTIME-INR
INR: 1.2 (ref 0.8–1.2)
Prothrombin Time: 15.1 seconds (ref 11.4–15.2)

## 2020-07-13 LAB — AMMONIA: Ammonia: 10 umol/L (ref 9–35)

## 2020-07-13 LAB — TYPE AND SCREEN
ABO/RH(D): A POS
Antibody Screen: NEGATIVE

## 2020-07-13 LAB — APTT: aPTT: 50 seconds — ABNORMAL HIGH (ref 24–36)

## 2020-07-13 LAB — CK
Total CK: 27326 U/L — ABNORMAL HIGH (ref 38–234)
Total CK: 31742 U/L — ABNORMAL HIGH (ref 38–234)

## 2020-07-13 MED ORDER — ALBUTEROL SULFATE (2.5 MG/3ML) 0.083% IN NEBU
2.5000 mg | INHALATION_SOLUTION | RESPIRATORY_TRACT | Status: DC | PRN
  Administered 2020-07-14 – 2020-07-15 (×3): 2.5 mg via RESPIRATORY_TRACT
  Filled 2020-07-13 (×3): qty 3

## 2020-07-13 MED ORDER — ALBUMIN HUMAN 25 % IV SOLN
12.5000 g | Freq: Once | INTRAVENOUS | Status: AC
  Administered 2020-07-13: 12.5 g via INTRAVENOUS
  Filled 2020-07-13: qty 50

## 2020-07-13 MED ORDER — HEPARIN (PORCINE) 25000 UT/250ML-% IV SOLN
1000.0000 [IU]/h | INTRAVENOUS | Status: DC
  Administered 2020-07-13: 950 [IU]/h via INTRAVENOUS
  Filled 2020-07-13 (×2): qty 250

## 2020-07-13 MED ORDER — FUROSEMIDE 10 MG/ML IJ SOLN
40.0000 mg | Freq: Two times a day (BID) | INTRAMUSCULAR | Status: DC
  Administered 2020-07-13 – 2020-07-15 (×4): 40 mg via INTRAVENOUS
  Filled 2020-07-13 (×5): qty 4

## 2020-07-13 MED ORDER — GABAPENTIN 600 MG PO TABS
300.0000 mg | ORAL_TABLET | Freq: Every day | ORAL | Status: DC
  Administered 2020-07-13 – 2020-07-14 (×2): 300 mg via ORAL
  Filled 2020-07-13 (×2): qty 0.5

## 2020-07-13 MED ORDER — GLUCERNA SHAKE PO LIQD
237.0000 mL | Freq: Three times a day (TID) | ORAL | Status: DC
  Administered 2020-07-13 – 2020-07-14 (×3): 237 mL via ORAL

## 2020-07-13 MED ORDER — FUROSEMIDE 10 MG/ML IJ SOLN
20.0000 mg | Freq: Two times a day (BID) | INTRAMUSCULAR | Status: DC
  Administered 2020-07-13: 20 mg via INTRAVENOUS
  Filled 2020-07-13: qty 2

## 2020-07-13 MED ORDER — GABAPENTIN 100 MG PO CAPS
100.0000 mg | ORAL_CAPSULE | Freq: Two times a day (BID) | ORAL | Status: DC
  Administered 2020-07-13 – 2020-07-15 (×5): 100 mg via ORAL
  Filled 2020-07-13 (×5): qty 1

## 2020-07-13 MED ORDER — FUROSEMIDE 10 MG/ML IJ SOLN
20.0000 mg | Freq: Once | INTRAMUSCULAR | Status: AC
  Administered 2020-07-13: 20 mg via INTRAVENOUS
  Filled 2020-07-13: qty 2

## 2020-07-13 NOTE — Progress Notes (Signed)
HOSPITAL MEDICINE OVERNIGHT EVENT NOTE    Went to evaluate patient at the bedside due to nursing concerns regarding progressive peripheral edema with associated B/L LE pain.    I went to evaluate the patient at the bedside.  Patient exhibits an impressive amount of edema of the B/L LE tracking up through the thighs as well as the B/L upper extremities.    Chart reviewed, patient receiving high rate fluids at 200 cc/hr to manage worsening rhabdomyolysis.    Due to the need for IVF will concurrently add scheduled Lasix therapy with a target urine output rate of 200-300 cc/hr.  Will titrate Lasix to achieve this rate.  Monitor closely.    Vernelle Emerald  MD [facility]

## 2020-07-13 NOTE — Progress Notes (Signed)
Modified Barium Swallow Progress Note  Patient Details  Name: NEHEMIE CASSERLY MRN: [id number] Date of Birth: 1934-09-22  Today's Date: 07/13/2020  Modified Barium Swallow completed.  Full report located under Chart Review in the Imaging Section.  Brief recommendations include the following:  Clinical Impression  Pt's positioning was suboptimal due to her inability to hold her head in an upright position d/t reported weakness. Without physical assistance from SLP and radiology tech, pt maintained a chin tuck posture. Pt exhibited difficulty with bolus propulsion, increased pharyngeal residue, and absent epiglottic inversion in this position, all increasing aspiration risk. Pt presented with oropharyngeal dysphagia characterized by reduced lingual retraction, reduced anterior laryngeal movement, reduced pharyngeal constriction, and aspiration (PAS 7) of nectar thick liquids secondary to spillover of pyriform sinus residue and delayed swallow initiation. Laryngeal sensation was adequate but coughing was ineffective. She demonstrated moderate vallecular residue, mild posterior pharyngeal wall residue, and mild-moderate pyriform sinus residue. A barium tablet was attempted, but pt refused stating that she needs her pills to be crushed since she is fearful of "choking" on them. Pt stated that she needs her meats to be cut "smaller than a fingernail" for her to be able to masticate them. Pt's diet will be downgraded to dysphagia 2 solids and thin liquids. 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 With support to keep her head in an upright position, her swallow function was improved and risk reduced. The results of the study and the potential use a soft cervical collar to help support pt's neck, and therefore facilitate use of an upright head position, were discussed with Dr. Posey Pronto and he  indicated that he will order it. SLP will follow pt.   Swallow Evaluation Recommendations       SLP Diet Recommendations: Dysphagia 2 (Fine chop) solids;Thin liquid   Liquid Administration via: Cup;Straw   Medication Administration: Crushed with puree   Supervision: Full assist for feeding;Full supervision/cueing for compensatory strategies   Compensations: Slow rate;Small sips/bites   Postural Changes: Seated upright at 90 degrees   Oral Care Recommendations: Oral care BID;Staff/trained caregiver to provide oral care       Etsuko Dierolf I. Hardin Negus, [place], [place] Office number [phone] Pager [phone]  Horton Marshall 07/13/2020,3:05 PM

## 2020-07-13 NOTE — Progress Notes (Signed)
Upper extremities extremely weepy. RN made aware. IV intact and patent.

## 2020-07-13 NOTE — Evaluation (Signed)
Clinical/Bedside Swallow Evaluation Patient Details  Name: Patricia Villegas MRN: [id number] Date of Birth: 04/03/1935  Today's Date: 07/13/2020 Time: SLP Start Time (ACUTE ONLY): 1030 SLP Stop Time (ACUTE ONLY): 1047 SLP Time Calculation (min) (ACUTE ONLY): 17 min  Past Medical History:  Past Medical History:  Diagnosis Date  . Anemia   . Anemia 06/14/2020  . Anxiety   . Arthritis   . Blood transfusion   . Chest pain    a. Lexiscan Myoview 12/12:  EF 75%, no ischemia or scar;  b.  Echo 12/12:  EF 60-65%, no sig valvular abnormalities   . Closed left fibular fracture    Casted   . Complication of anesthesia 1998   Unable to arouse fully, respiratory depression, pt. reports that the family was told that she would need a trach., but then she stabilized & didn't need it   . Coronary artery disease    sees Dr Aundra Dubin every 6 months  . Depression   . Diabetes mellitus    Greater than 20 years type 2   . GERD (gastroesophageal reflux disease)   . H/O hiatal hernia   . Hypercholesteremia   . Hypertension    Greater than 20 years  . Lumbar disc disease    S/p surgery x4;  Sciatica   . Neuropathy due to secondary diabetes ([place])    Hx: of  . Parastomal hernia of ileal conduit 2009  . Pneumonia    s/p CABG  . [name] (peripheral vascular disease) (HCC)    Stable claudication. Arteriogram 07/2009 no significant aortoiliac disease, has bilateral SFA occlusions with distal reconstitution   . Rheumatoid arthritis ([place])    Dr Berna Bue @ [facility]  . Transfusion reaction 1960   Convulsions (? seizure)  . UTI (lower urinary tract infection) 01/03/15   Past Surgical History:  Past Surgical History:  Procedure Laterality Date  . ABDOMINAL HYSTERECTOMY    . BACK SURGERY     4 back operations most recently 1995, 1998  . BIOPSY  06/17/2020   Procedure: BIOPSY;  Surgeon: Yetta Flock, MD;  Location: [facility] ENDOSCOPY;  Service: Gastroenterology;;  . CARDIAC CATHETERIZATION      Hx: of 05/14/13  . CATARACT EXTRACTION W/ INTRAOCULAR LENS  IMPLANT, BILATERAL     Hx: of  . COLONOSCOPY     Hx; of  . COLONOSCOPY WITH PROPOFOL N/A 06/17/2020   Procedure: COLONOSCOPY WITH PROPOFOL;  Surgeon: Yetta Flock, MD;  Location: [facility] ENDOSCOPY;  Service: Gastroenterology;  Laterality: N/A;  . CORONARY ARTERY BYPASS GRAFT N/A 05/24/2013   Procedure: CORONARY ARTERY BYPASS GRAFTING (CABG);  Surgeon: Melrose Nakayama, MD;  Location: [place];  Service: Open Heart Surgery;  Laterality: N/A;  . DILATION AND CURETTAGE OF UTERUS    . ESOPHAGOGASTRODUODENOSCOPY (EGD) WITH PROPOFOL N/A 06/17/2020   Procedure: ESOPHAGOGASTRODUODENOSCOPY (EGD) WITH PROPOFOL;  Surgeon: Yetta Flock, MD;  Location: [place];  Service: Gastroenterology;  Laterality: N/A;  . HEMOSTASIS CLIP PLACEMENT  06/17/2020   Procedure: HEMOSTASIS CLIP PLACEMENT;  Surgeon: Yetta Flock, MD;  Location: [place] ENDOSCOPY;  Service: Gastroenterology;;  . HERNIA REPAIR  2009  . LEFT HEART CATHETERIZATION WITH CORONARY ANGIOGRAM N/A 05/14/2013   Procedure: LEFT HEART CATHETERIZATION WITH CORONARY ANGIOGRAM;  Surgeon: Larey Dresser, MD;  Location: [facility] CATH LAB;  Service: Cardiovascular;  Laterality: N/A;  . POLYPECTOMY  06/17/2020   Procedure: POLYPECTOMY;  Surgeon: Yetta Flock, MD;  Location: [facility] ENDOSCOPY;  Service: Gastroenterology;;  . TONSILLECTOMY    .  TOTAL HIP ARTHROPLASTY Left 01/10/2015   Procedure: TOTAL HIP ARTHROPLASTY ANTERIOR APPROACH;  Surgeon: Melrose Nakayama, MD;  Location: [place];  Service: Orthopedics;  Laterality: Left;  . TOTAL HIP ARTHROPLASTY Right 05/30/2020   Procedure: RIGHT TOTAL HIP ARTHROPLASTY ANTERIOR APPROACH;  Surgeon: Melrose Nakayama, MD;  Location: [facility] ORS;  Service: Orthopedics;  Laterality: Right;   HPI:  Pt is an 85 y.o. female with medical history significant of RA, [name], DM, HTN, [name], and CAD s/p CABG who presented with leg pain.  She had physical therapy sessions and became  very weak came back to the hospital was found to have a bleeding ulcer and a DVT. Per neurology's note, pt has had an insidious slow progressive onset of weakness for 3-4 weeks concerning for myopathy. SLP was consulted since dysphagia may be related to myopathy.    Assessment / Plan / Recommendation Clinical Impression  Pt was seen for bedside swallow evaluation. She reported that she typically takes her medications with applesauce or toast and "got choked" this morning with pills given with liquids. Per the pt, since admission meats have gotten "hung up". Oral mechanism exam was WNL and dentition was adequate. Pt tolerated ice chips without overt s/sx of aspiration but exhibited throat clearing, coughing, and wheezing following intake of thin liquids via straw, suggesting aspiration. Pt remarked after thin liquids bolus, "I got choked again" and requested that additional boluses be deferred. A modified barium swallow study is recommended to further assess swallow function and pt is in agreement with this recommendation.   SLP Visit Diagnosis: Dysphagia, unspecified (R13.10)    Aspiration Risk  Mild aspiration risk    Diet Recommendation     Supervision: Staff to assist with self feeding Postural Changes: Seated upright at 90 degrees    Other  Recommendations Oral Care Recommendations: Oral care BID;Staff/trained caregiver to provide oral care   Follow up Recommendations  (TBD)      Frequency and Duration min 2x/week  2 weeks       Prognosis Barriers to Reach Goals: Severity of deficits      Swallow Study   General Date of Onset: 07/30/2020 HPI: Pt is an 85 y.o. female with medical history significant of RA, [name], DM, HTN, [name], and CAD s/p CABG who presented with leg pain.  She had physical therapy sessions and became very weak came back to the hospital was found to have a bleeding ulcer and a DVT. Per neurology's note, pt has had an insidious slow progressive onset of weakness for 3-4  weeks concerning for myopathy. SLP was consulted since dysphagia may be related to myopathy.  Type of Study: Bedside Swallow Evaluation Previous Swallow Assessment: None Diet Prior to this Study: Regular;Thin liquids Temperature Spikes Noted: No Respiratory Status: Room air History of Recent Intubation: No Behavior/Cognition: Alert;Cooperative;Pleasant mood Oral Cavity Assessment: Within Functional Limits Oral Care Completed by SLP: No Vision: Functional for self-feeding Self-Feeding Abilities: Able to feed self Patient Positioning: Upright in bed Baseline Vocal Quality: Normal Volitional Cough: Strong Volitional Swallow: Able to elicit    Oral/Motor/Sensory Function Overall Oral Motor/Sensory Function: Within functional limits   Ice Chips Ice chips: Within functional limits Presentation: Spoon   Thin Liquid Thin Liquid: Impaired Presentation: Straw Pharyngeal  Phase Impairments: Throat Clearing - Immediate;Cough - Immediate (wheezing)    Nectar Thick Nectar Thick Liquid: Not tested   Honey Thick Honey Thick Liquid: Not tested   Puree Puree: Not tested (Pt refused)   Solid    Tobie Poet  Ronne Binning, MS, [place] Office number [phone] Pager [phone]  Solid: Not tested      Horton Marshall 07/13/2020,10:56 AM

## 2020-07-13 NOTE — Progress Notes (Signed)
  Speech Language Pathology Treatment: Dysphagia  Patient Details Name: Patricia Villegas MRN: [id number] DOB: 03/09/1935 Today's Date: 07/13/2020 Time: 0263-7858 SLP Time Calculation (min) (ACUTE ONLY): 20 min  Assessment / Plan / Recommendation Clinical Impression  SLP was documenting outside of pt's room and about to see the pt when the pt's family emerged from her room and exclaimed, "She's choking on water!" SLP and two RNs entered the pt's room to assess the pt. Pt was reclined and coughing. Pt was repositioned upright and coughing persisted. Pt and her daughter-in-law were educated regarding the results of the modified barium swallow study, diet recommendations, and swallowing precautions. Video recording of the study was used to facilitate education and both parties verbalized understanding. Pt demonstrated comprehension of swallowing precautions using teach back and described her increased aspiration risk when she is in a reclined position. Both parties verbalized agreement with use of cervical collar to assist with positioning and pt indicated that she believes she will enjoy it since she has difficulty speaking with her head down. SLP will continue to follow pt.    HPI HPI: Pt is an 85 y.o. female with medical history significant of RA, [name], DM, HTN, [name], and CAD s/p CABG who presented with leg pain.  She had physical therapy sessions and became very weak came back to the hospital was found to have a bleeding ulcer and a DVT. Per neurology's note, pt has had an insidious slow progressive onset of weakness for 3-4 weeks concerning for myopathy. SLP was consulted since dysphagia may be related to myopathy.       SLP Plan  Continue with current plan of care       Recommendations  Diet recommendations: Dysphagia 2 (fine chop);Thin liquid Liquids provided via: Cup;Straw Medication Administration: Crushed with puree Supervision: Full supervision/cueing for compensatory strategies;Staff to  assist with self feeding Compensations: Slow rate;Small sips/bites Postural Changes and/or Swallow Maneuvers: Seated upright 90 degrees                Oral Care Recommendations: Oral care BID;Staff/trained caregiver to provide oral care Follow up Recommendations:  (TBD) SLP Visit Diagnosis: Dysphagia, oropharyngeal phase (R13.12) Plan: Continue with current plan of care       Gal Smolinski I. Hardin Negus, [place], [place] Office number [phone] Pager [place] 07/13/2020, 4:00 PM

## 2020-07-13 NOTE — Progress Notes (Signed)
[place] for Heparin Indication: Conversion from apixaban to heparin for recent DVT in setting of impending procedure.   No Known Allergies  Patient Measurements: Height: 5\' 6"  (167.6 cm) Weight: 66.2 kg (146 lb) IBW/kg (Calculated) : 59.3 Heparin Dosing Weight: 66.2  Vital Signs: Temp: 97.7 F (36.5 C) (11/04 0750) Temp Source: Oral (11/04 0750) BP: 115/65 (11/04 0750) Pulse Rate: 65 (11/04 0800)  Labs: Recent Labs    07/12/20 0412 07/12/20 0412 07/12/20 1700 07/13/20 0437  HGB 8.5*   < > 9.2* 8.2*  HCT 26.9*  --  28.2* 25.5*  PLT 203  --  240 215  APTT 29  --   --   --   LABPROT 18.6*  --   --  15.1  INR 1.6*  --   --  1.2  CREATININE 0.63  --  0.60 0.67  CKTOTAL 23,406*  --  31,948* 27,326*   < > = values in this interval not displayed.    Estimated Creatinine Clearance: 49.9 mL/min (by C-G formula based on SCr of 0.67 mg/dL).   Medical History: Past Medical History:  Diagnosis Date  . Anemia   . Anemia 06/14/2020  . Anxiety   . Arthritis   . Blood transfusion   . Chest pain    a. Lexiscan Myoview 12/12:  EF 75%, no ischemia or scar;  b.  Echo 12/12:  EF 60-65%, no sig valvular abnormalities   . Closed left fibular fracture    Casted   . Complication of anesthesia 1998   Unable to arouse fully, respiratory depression, pt. reports that the family was told that she would need a trach., but then she stabilized & didn't need it   . Coronary artery disease    sees Dr Aundra Dubin every 6 months  . Depression   . Diabetes mellitus    Greater than 20 years type 2   . GERD (gastroesophageal reflux disease)   . H/O hiatal hernia   . Hypercholesteremia   . Hypertension    Greater than 20 years  . Lumbar disc disease    S/p surgery x4;  Sciatica   . Neuropathy due to secondary diabetes ([place])    Hx: of  . Parastomal hernia of ileal conduit 2009  . Pneumonia    s/p CABG  . [name] (peripheral vascular disease) (HCC)     Stable claudication. Arteriogram 07/2009 no significant aortoiliac disease, has bilateral SFA occlusions with distal reconstitution   . Rheumatoid arthritis ([place])    Dr Berna Bue @ [facility]  . Transfusion reaction 1960   Convulsions (? seizure)  . UTI (lower urinary tract infection) 01/03/15    [name]; [name]; DM; HTN; [name]; [name], [name],  PharmD, [facility] Clinical Pharmacist Please see AMION for all Pharmacists' Contact Phone Numbers 07/13/2020, 12:55 PM

## 2020-07-13 NOTE — Progress Notes (Signed)
Neurology Progress Note  Patient ID: Patricia Villegas is a 85 y.o. with Patricia Villegas is a 85 y.o. female with history of [name], DVT on Eliquis, rheumatoid arthritis, hypertension, hypercholesterolemia, CAD, recent right total hip arthroplasty, recent GI bleed secondary to H. Pylori infection, anemia, diabetes, coronary artery disease status post CABG  Initially consulted for: c/f myositis given elevated CK  Major interval events/subjective:  - GI consulted  - Worsening edema, started on Lasix  - stool frequency improved  - Reports worsening pain especially in feet, no headache, no abdominal pain  Exam: Vitals:   07/12/20 1954 07/13/20 0327  BP: (!) 117/55 (!) 113/47  Pulse: 76 66  Resp: 17 16  Temp: 97.6 F (36.4 C) (!) 97.5 F (36.4 C)  SpO2: 100% 98%   Gen: In bed, comfortable Affect: Flat today Resp: non-labored breathing, no grossly audible wheezing Cardiac: Perfusing extremities well. Diffuse pitting edema Abd: soft, nt Extremities: scattered ecchymosis.   Neuro: MS: Alert awake and oriented to person, place, time, situation CN: Face symmetric, EOMI intact to tracking examiner Motor: Proximally 1/5 in the arms, distally 3/5. In the legs proximally 1/5, distally 3/5 ( a bit worsened from day prior) but also with overlay of fatigue/pain Sensory: Stable distal reduced sensation  Pertinent Labs: CK continues to rise  WBC rising Platelets, Hgb stable  Impression: Will start pulse dose steroids today for myositis, and gabapentin for pain.   Recommendations: -Starting 1000 mg IV steroids daily x 5 days (11/4 - 11/9)  -Starting gabapentin 100 mg BID and 300 mg QHS to address pain  Lesleigh Noe MD-PhD [facility] [phone]

## 2020-07-13 NOTE — Progress Notes (Signed)
Patient called nurse and verbalized to this  nurse "  I feel swollen up , I cannot take this anymore, my legs hurt". Pain medication given to patient as ordered. Upon assessment patient BUE +3 edema, BLE +1 edema. Both extremities elevated on pillows. On call provider paged. Per M. Sharlet Salina NP, she has spoken with Dr Cyd Silence to evaluate patient . Will continue to monitor

## 2020-07-13 NOTE — Plan of Care (Signed)
  Problem: Nutrition: Goal: Adequate nutrition will be maintained Outcome: Progressing   Problem: Coping: Goal: Level of anxiety will decrease Outcome: Progressing   

## 2020-07-13 NOTE — Progress Notes (Signed)
[facility] Progress Note  Patient: Patricia Villegas    IHK:[id number]  DOA: 07/22/2020     Date of Service: the patient was seen and examined on 07/13/2020  Brief hospital course: Past medical history of RA; [name]; DM; HTN; [name]; and CAD s/p CABG presenting with leg pain.  Found to have severe rhabdomyolysis along with elevated LFT.  Neurology was consulted.  GI was consulted for melena history. Currently plan is follow-up on further work-up and treatment of myositis.  Assessment and Plan: 1.  Nontraumatic rhabdomyolysis Presumed myositis Generalized weakness of both upper and lower extremity primarily proximal along with pain in lower extremity. CK continues to trend up despite IV LR. Patient appears to be volume overloading. Patient was on Zocor currently on hold. Hepatic function panel also shows worsening LFT likely secondary to myositis. Neurology was consulted. May require muscle biopsy.  Following up with neuro recommendation. Initiating high-dose steroids with 1 g IV Solu-Medrol. On chronic prednisone secondary to history of rheumatoid arthritis.  Currently on stress dose steroids. Okay to start the patient back on methotrexate at home prior to surgery dose as well.  2.  Recent DVT Diagnosed in October 2021. Started on Eliquis. Currently reports melena. GI was consulted.  Currently no plan for intervention.  Will initiate IV heparin and monitor.  3.  Acute on chronic anemia with melena Acute on chronic blood loss anemia. Recently hospitalized for anemia underwent EGD found to have duodenal ulcer. Continuing twice daily PPI. Discontinue Eliquis for now. If continues to have drop in hemoglobin will require transfusion for hemoglobin less than 7.  4.  Urinary retention Monitor for now.  Resolved  5.  Chronic diastolic CHF Essential hypertension CAD SP CABG Appears volume overloaded. With provide IV albumin as she is primarily third spacing all the fluid that she  received. Also reduce the frequency of the fluid. Currently no evidence of ACS.  6.  Rheumatoid arthritis Holding Plaquenil. Neuro prefers to start methotrexate again.  Okay to start methotrexate.  7.  Dysphagia. Secondary to generalized weakness and fatigue but other than focal deficit. Underwent speech therapy evaluation with modified barium swallow. Speech recommends cervical collar to support the head to minimize aspiration and also dysphagia 2 diet as she has weakness with mastication. Ordered Glucerna. Body mass index is 23.57 kg/m.    Interventions:       Diet: Cardiac diet DVT Prophylaxis: SCD, pharmacological prophylaxis contraindicated due to Concern for GI bleed      Advance goals of care discussion: DNR  Family Communication: family was present at bedside, at the time of interview.   The pt provided permission to discuss medical plan with the family. Opportunity was given to ask question and all questions were answered satisfactorily.   Disposition:  Status is: Inpatient  Remains inpatient appropriate because:IV treatments appropriate due to intensity of illness or inability to take PO   Dispo: The patient is from: Home              Anticipated d/c is to: SNF              Anticipated d/c date is: 3 days              Patient currently is not medically stable to d/c.        Subjective: No acute complaint.  No nausea no vomiting.  No fever no chills.  Physical Exam:  General: Appear in mild distress, no Rash; Oral Mucosa Clear, moist. no Abnormal  Neck Mass Or lumps, Conjunctiva normal  Cardiovascular: S1 and S2 Present, no Murmur, Respiratory: increased respiratory effort, Bilateral Air entry present and bilateral  Crackles, no wheezes Abdomen: Bowel Sound present, Soft and mild pressuure no tenderness Extremities: bilateral  Pedal edema Neurology: alert and oriented to time, place, and person affect appropriate. no new focal deficit proximal muscle  weakness bilateral upper and lower extremity Gait not checked due to patient safety concerns  Vitals:   07/13/20 0327 07/13/20 0750 07/13/20 0800 07/13/20 1337  BP: (!) 113/47 115/65  118/63  Pulse: 66 70 65 68  Resp: 16 16  17   Temp: (!) 97.5 F (36.4 C) 97.7 F (36.5 C)  97.7 F (36.5 C)  TempSrc: Oral Oral  Oral  SpO2: 98% 98% 97% 98%  Weight:      Height:        Intake/Output Summary (Last 24 hours) at 07/13/2020 1900 Last data filed at 07/13/2020 1810 Gross per 24 hour  Intake 5441.63 ml  Output 1100 ml  Net 4341.63 ml   Filed Weights   08/07/2020 0031  Weight: 66.2 kg    Data Reviewed: I have personally reviewed and interpreted daily labs, tele strips, imagings as discussed above. I reviewed all nursing notes, pharmacy notes, vitals, pertinent old records I have discussed plan of care as described above with RN and patient/family.  CBC: Recent Labs  Lab 07/17/2020 0111 07/12/20 0412 07/12/20 1700 07/13/20 0437 07/13/20 1651  WBC 15.5* 23.2* 37.5* 34.2* 28.9*  HGB 8.8* 8.5* 9.2* 8.2* 9.4*  HCT 28.0* 26.9* 28.2* 25.5* 28.7*  MCV 105.3* 103.5* 102.5* 103.7* 102.9*  PLT 211 203 240 215 381   Basic Metabolic Panel: Recent Labs  Lab 07/14/2020 0111 07/25/2020 0459 07/21/2020 1755 07/12/20 0412 07/12/20 1700 07/13/20 0437 07/13/20 1651  NA 141   < > 135 135 137 138 138  K 4.0   < > 4.5 4.3 4.1 4.2 4.4  CL 106   < > 104 103 102 107 106  CO2 26   < > 22 23 23 23 23   GLUCOSE 106*   < > 120* 138* 183* 171* 203*  BUN 23   < > 17 20 24* 25* 27*  CREATININE 0.65   < > 0.62 0.63 0.60 0.67 0.68  CALCIUM 8.4*   < > 8.1* 8.0* 8.3* 8.0* 8.0*  MG 2.2  --   --   --   --   --   --    < > = values in this interval not displayed.    Studies: DG CHEST PORT 1 VIEW  Result Date: 07/13/2020 CLINICAL DATA:  Shortness of breath.  Weakness. EXAM: PORTABLE CHEST 1 VIEW COMPARISON:  07/01/2020 FINDINGS: Midline trachea. Mild cardiomegaly. Atherosclerosis in the transverse aorta.  Mitral annular calcifications. Median sternotomy for CABG. No pleural fluid. Biapical pleuroparenchymal scarring. No congestive failure. Mild scarring or atelectasis at the lateral left lung base. IMPRESSION: Cardiomegaly, without congestive failure. Aortic Atherosclerosis (ICD10-I70.0). Electronically Signed   By: Abigail Miyamoto M.D.   On: 07/13/2020 10:41   DG Swallowing Func-Speech Pathology  Result Date: 07/13/2020 Objective Swallowing Evaluation: Type of Study: MBS-Modified Barium Swallow Study  Patient Details Name: MARSHAE AZAM MRN: [id number] Date of Birth: February 15, 1935 Today's Date: 07/13/2020 Time: SLP Start Time (ACUTE ONLY): 1232 -SLP Stop Time (ACUTE ONLY): 1253 SLP Time Calculation (min) (ACUTE ONLY): 21 min Past Medical History: Past Medical History: Diagnosis Date  Anemia   Anemia 06/14/2020  Anxiety   Arthritis  Blood transfusion   Chest pain   a. Lexiscan Myoview 12/12:  EF 75%, no ischemia or scar;  b.  Echo 12/12:  EF 60-65%, no sig valvular abnormalities   Closed left fibular fracture   Casted   Complication of anesthesia 1998  Unable to arouse fully, respiratory depression, pt. reports that the family was told that she would need a trach., but then she stabilized & didn't need it   Coronary artery disease   sees Dr Aundra Dubin every 6 months  Depression   Diabetes mellitus   Greater than 20 years type 2   GERD (gastroesophageal reflux disease)   H/O hiatal hernia   Hypercholesteremia   Hypertension   Greater than 20 years  Lumbar disc disease   S/p surgery x4;  Sciatica   Neuropathy due to secondary diabetes ([place])   Hx: of  Parastomal hernia of ileal conduit 2009  Pneumonia   s/p CABG  [name] (peripheral vascular disease) ([facility])   Stable claudication. Arteriogram 07/2009 no significant aortoiliac disease, has bilateral SFA occlusions with distal reconstitution   Rheumatoid arthritis ([place])   Dr Berna Bue @ [facility]  Transfusion reaction 1960  Convulsions (? seizure)   UTI (lower urinary tract infection) 01/03/15 Past Surgical History: Past Surgical History: Procedure Laterality Date  ABDOMINAL HYSTERECTOMY    BACK SURGERY    4 back operations most recently [place]  06/17/2020  Procedure: BIOPSY;  Surgeon: Yetta Flock, MD;  Location: [place];  Service: Gastroenterology;;  CARDIAC CATHETERIZATION    Hx: of 05/14/13  CATARACT EXTRACTION W/ INTRAOCULAR LENS  IMPLANT, BILATERAL    Hx: of  COLONOSCOPY    Hx; of  COLONOSCOPY WITH PROPOFOL N/A 06/17/2020  Procedure: COLONOSCOPY WITH PROPOFOL;  Surgeon: Yetta Flock, MD;  Location: [place];  Service: Gastroenterology;  Laterality: N/A;  CORONARY ARTERY BYPASS GRAFT N/A 05/24/2013  Procedure: CORONARY ARTERY BYPASS GRAFTING (CABG);  Surgeon: Melrose Nakayama, MD;  Location: [place];  Service: Open Heart Surgery;  Laterality: N/A;  DILATION AND CURETTAGE OF UTERUS    ESOPHAGOGASTRODUODENOSCOPY (EGD) WITH PROPOFOL N/A 06/17/2020  Procedure: ESOPHAGOGASTRODUODENOSCOPY (EGD) WITH PROPOFOL;  Surgeon: Yetta Flock, MD;  Location: [place];  Service: Gastroenterology;  Laterality: N/A;  HEMOSTASIS CLIP PLACEMENT  06/17/2020  Procedure: HEMOSTASIS CLIP PLACEMENT;  Surgeon: Yetta Flock, MD;  Location: [place] ENDOSCOPY;  Service: Gastroenterology;;  HERNIA REPAIR  2009  LEFT HEART CATHETERIZATION WITH CORONARY ANGIOGRAM N/A 05/14/2013  Procedure: LEFT HEART CATHETERIZATION WITH CORONARY ANGIOGRAM;  Surgeon: Larey Dresser, MD;  Location: [facility] CATH LAB;  Service: Cardiovascular;  Laterality: N/A;  POLYPECTOMY  06/17/2020  Procedure: POLYPECTOMY;  Surgeon: Yetta Flock, MD;  Location: [facility] ENDOSCOPY;  Service: Gastroenterology;;  TONSILLECTOMY    TOTAL HIP ARTHROPLASTY Left 01/10/2015  Procedure: TOTAL HIP ARTHROPLASTY ANTERIOR APPROACH;  Surgeon: Melrose Nakayama, MD;  Location: [place];  Service: Orthopedics;  Laterality: Left;  TOTAL HIP ARTHROPLASTY Right 05/30/2020  Procedure: RIGHT  TOTAL HIP ARTHROPLASTY ANTERIOR APPROACH;  Surgeon: Melrose Nakayama, MD;  Location: [facility];  Service: Orthopedics;  Laterality: Right; HPI: Pt is an 85 y.o. female with medical history significant of RA, [name], DM, HTN, [name], and CAD s/p CABG who presented with leg pain.  She had physical therapy sessions and became very weak came back to the hospital was found to have a bleeding ulcer and a DVT. Per neurology's note, pt has had an insidious slow progressive onset of weakness for 3-4 weeks concerning for myopathy.  SLP was consulted since dysphagia may be related to myopathy.  No data recorded Assessment / Plan / Recommendation CHL IP CLINICAL IMPRESSIONS 07/13/2020 Clinical Impression Pt's positioning was suboptimal due to her inability to hold her head in an upright position d/t reported weakness. Without physical assistance from SLP and radiology tech, pt maintained a chin tuck posture. Pt exhibited difficulty with bolus propulsion, increased pharyngeal residue, and absent epiglottic inversion in this position, all increasing aspiration risk. Pt presented with oropharyngeal dysphagia characterized by reduced lingual retraction, reduced anterior laryngeal movement, reduced pharyngeal constriction, and aspiration (PAS 7) of nectar thick liquids secondary to spillover of pyriform sinus residue and delayed swallow initiation. Laryngeal sensation was adequate but coughing was ineffective. She demonstrated moderate vallecular residue, mild posterior pharyngeal wall residue, and mild-moderate pyriform sinus residue. A barium tablet was attempted, but pt refused stating that she needs her pills to be crushed since she is fearful of "choking" on them. Pt stated that she needs her meats to be cut "smaller than a fingernail" for her to be able to masticate them. Pt's diet will be downgraded to dysphagia 2 solids and thin liquids. Pt's unintentional use of a chin tuck posture due to myopathy places her at increased risk of  aspiration due to reduced airway protection and reduced bolus propulsion. With support to keep her head in an upright position, her swallow function was improved and risk reduced. The results of the study and the potential use a soft cervical collar to help support pt's neck, and therefore facilitate use of an upright head position, were discussed with Dr. Posey Pronto and he indicated that he will order it. SLP will follow pt. SLP Visit Diagnosis Dysphagia, unspecified (R13.10) Attention and concentration deficit following -- Frontal lobe and executive function deficit following -- Impact on safety and function Mild aspiration risk   CHL IP TREATMENT RECOMMENDATION 07/13/2020 Treatment Recommendations Therapy as outlined in treatment plan below   Prognosis 07/13/2020 Prognosis for Safe Diet Advancement Good Barriers to Reach Goals Severity of deficits Barriers/Prognosis Comment -- CHL IP DIET RECOMMENDATION 07/13/2020 SLP Diet Recommendations Dysphagia 2 (Fine chop) solids;Thin liquid Liquid Administration via Cup;Straw Medication Administration Crushed with puree Compensations Slow rate;Small sips/bites Postural Changes Seated upright at 90 degrees   CHL IP OTHER RECOMMENDATIONS 07/13/2020 Recommended Consults -- Oral Care Recommendations Oral care BID;Staff/trained caregiver to provide oral care Other Recommendations --   CHL IP FOLLOW UP RECOMMENDATIONS 07/13/2020 Follow up Recommendations (No Data)   CHL IP FREQUENCY AND DURATION 07/13/2020 Speech Therapy Frequency (ACUTE ONLY) min 2x/week Treatment Duration 2 weeks      CHL IP ORAL PHASE 07/13/2020 Oral Phase Impaired Oral - Pudding Teaspoon -- Oral - Pudding Cup -- Oral - Honey Teaspoon -- Oral - Honey Cup Reduced posterior propulsion Oral - Nectar Teaspoon -- Oral - Nectar Cup Reduced posterior propulsion Oral - Nectar Straw Reduced posterior propulsion Oral - Thin Teaspoon -- Oral - Thin Cup -- Oral - Thin Straw Reduced posterior propulsion Oral - Puree Reduced posterior  propulsion Oral - Mech Soft Reduced posterior propulsion Oral - Regular Reduced posterior propulsion Oral - Multi-Consistency -- Oral - Pill -- Oral Phase - Comment --  CHL IP PHARYNGEAL PHASE 07/13/2020 Pharyngeal Phase Impaired Pharyngeal- Pudding Teaspoon -- Pharyngeal -- Pharyngeal- Pudding Cup -- Pharyngeal -- Pharyngeal- Honey Teaspoon -- Pharyngeal -- Pharyngeal- Honey Cup Pharyngeal residue - valleculae;Pharyngeal residue - pyriform;Pharyngeal residue - posterior pharnyx;Reduced anterior laryngeal mobility;Reduced epiglottic inversion Pharyngeal -- Pharyngeal- Nectar Teaspoon -- Pharyngeal -- Pharyngeal-  Nectar Cup -- Pharyngeal -- Pharyngeal- Nectar Straw Pharyngeal residue - valleculae;Pharyngeal residue - pyriform;Pharyngeal residue - posterior pharnyx;Reduced anterior laryngeal mobility;Reduced epiglottic inversion;Moderate aspiration;Penetration/Aspiration during swallow;Penetration/Apiration after swallow Pharyngeal Material enters airway, passes BELOW cords and not ejected out despite cough attempt by patient Pharyngeal- Thin Teaspoon -- Pharyngeal -- Pharyngeal- Thin Cup -- Pharyngeal -- Pharyngeal- Thin Straw Pharyngeal residue - valleculae;Pharyngeal residue - pyriform;Pharyngeal residue - posterior pharnyx;Reduced anterior laryngeal mobility;Reduced epiglottic inversion Pharyngeal -- Pharyngeal- Puree Pharyngeal residue - valleculae;Pharyngeal residue - pyriform;Pharyngeal residue - posterior pharnyx;Reduced anterior laryngeal mobility;Reduced epiglottic inversion Pharyngeal -- Pharyngeal- Mechanical Soft Pharyngeal residue - valleculae;Pharyngeal residue - pyriform;Pharyngeal residue - posterior pharnyx;Reduced anterior laryngeal mobility;Reduced epiglottic inversion Pharyngeal -- Pharyngeal- Regular Pharyngeal residue - valleculae;Pharyngeal residue - pyriform;Pharyngeal residue - posterior pharnyx;Reduced anterior laryngeal mobility;Reduced epiglottic inversion Pharyngeal -- Pharyngeal-  Multi-consistency -- Pharyngeal -- Pharyngeal- Pill -- Pharyngeal -- Pharyngeal Comment --  Shanika I. Hardin Negus, [place], [place] Office number [phone] Pager [phone] No flowsheet data found. Horton Marshall 07/13/2020, 3:11 PM               Scheduled Meds:  allopurinol  200 mg Oral Daily   docusate sodium  100 mg Oral BID   feeding supplement (GLUCERNA SHAKE)  237 mL Oral TID BM   folic acid  2 mg Oral Daily   furosemide  40 mg Intravenous BID   gabapentin  100 mg Oral BID   gabapentin  300 mg Oral QHS   insulin aspart  0-9 Units Subcutaneous TID WC   insulin glargine  10 Units Subcutaneous QPM   liver oil-zinc oxide  1 application Topical TID   melatonin  3 mg Oral QHS   metoCLOPramide  5 mg Oral QHS   metoprolol tartrate  12.5 mg Oral BID   pantoprazole (PROTONIX) IV  40 mg Intravenous Q12H   phytonadione  5 mg Oral Daily   Continuous Infusions:  heparin 950 Units/hr (07/13/20 1807)   lactated ringers 75 mL/hr at 07/13/20 1319   methylPREDNISolone (SOLU-MEDROL) injection 1,000 mg (07/13/20 1145)   PRN Meds: acetaminophen **OR** acetaminophen, albuterol, bisacodyl, hydrALAZINE, morphine injection, ondansetron **OR** ondansetron (ZOFRAN) IV, oxyCODONE, polyethylene glycol, senna-docusate, tiZANidine, traMADol  Time spent: 35 minutes  Author: Berle Mull, MD [facility] 07/13/2020 7:00 PM  To reach On-call, see care teams to locate the attending and reach out via [email or web address]. Between 7PM-7AM, please contact night-coverage If you still have difficulty reaching the attending provider, please page the [facility] (Director on Call) for [facility] on amion for assistance.

## 2020-07-13 NOTE — Progress Notes (Signed)
[place] for Heparin Indication: Conversion from apixaban to heparin for recent DVT in setting of impending procedure.   No Known Allergies  Patient Measurements: Height: 5\' 6"  (167.6 cm) Weight: 66.2 kg (146 lb) IBW/kg (Calculated) : 59.3 Heparin Dosing Weight: 66.2  Vital Signs: Temp: 98.2 F (36.8 C) (11/04 1925) Temp Source: Oral (11/04 1925) BP: 152/63 (11/04 1925) Pulse Rate: 81 (11/04 1925)  Labs: Recent Labs    07/12/20 0412 07/12/20 0412 07/12/20 1700 07/12/20 1700 07/13/20 0437 07/13/20 1651 07/13/20 2058  HGB 8.5*   < > 9.2*   < > 8.2* 9.4*  --   HCT 26.9*   < > 28.2*  --  25.5* 28.7*  --   PLT 203   < > 240  --  215 207  --   APTT 29  --   --   --   --   --  50*  LABPROT 18.6*  --   --   --  15.1  --   --   INR 1.6*  --   --   --  1.2  --   --   CREATININE 0.63   < > 0.60  --  0.67 0.68  --   CKTOTAL 23,406*  --  40,347*  --  27,326*  --   --    < > = values in this interval not displayed.    Estimated Creatinine Clearance: 49.9 mL/min (by C-G formula based on SCr of 0.68 mg/dL).  [name]; [name]; DM; HTN; [name]; and CAD s/p CABG presenting with leg pain. Patient with possible myositis with possible procedure due. Started on apixaban recently within last few weeks for acute left leg DVT. Patient received last dose of apixaban at ~0930 this AM. Will start heparin drip 12 hours after last dose of apixaban.  Heparin stopped due to melana in the stool.  Will restart with no bolus.   Initial aptt 50 - spoke with rn no issues, no bleeding. Was off for ~ 30 min around 1800 but unknown why  Goal of Therapy:  Heparin level 0.3-0.7 APTT 66-102 sec Monitor platelets by anticoagulation protocol: Yes   Plan:  Increase heparin to 1050 units/hr Daily hep lvl cbc aptt  Barth Kirks, PharmD, BCPS, BCCCP Clinical Pharmacist [phone]  Please check AMION for all [place] numbers  07/13/2020 10:26 PM

## 2020-07-13 NOTE — Progress Notes (Addendum)
Daily Rounding Note  07/13/2020, 11:25 AM  LOS: 2 days   SUBJECTIVE:   Chief complaint: Dark, FOBT positive stool.  Duodenal ulcers.  Elevated transaminases.  Rhabdomyolysis.  Myopathy    Upper and lower extremity weakness persists.   Black stool.  No nausea.  Choked on pills and having problems swallowing  OBJECTIVE:         Vital signs in last 24 hours:    Temp:  [97.5 F (36.4 C)-97.7 F (36.5 C)] 97.7 F (36.5 C) (11/04 0750) Pulse Rate:  [65-76] 65 (11/04 0800) Resp:  [16-17] 16 (11/04 0750) BP: (113-117)/(47-65) 115/65 (11/04 0750) SpO2:  [97 %-100 %] 97 % (11/04 0800) Last BM Date: 07/12/20 Filed Weights   07/21/2020 0031  Weight: 66.2 kg   General: frail, alert, comfortable,  Not toxic or morbidly ill looking   Heart: RRR Chest: no labored breathing or dyspnea.  No cough Abdomen: soft, NT, ND.  BS hypoactive  Extremities: edema in all 4 limbs.  Purpura on limbs and upper anteriot trunk Neuro/Psych:  Oriented x 3.  Fluid speech.  Calm, pleasant, gentle affect.  Weakness in arms and legs.    Intake/Output from previous day: 11/03 0701 - 11/04 0700 In: 120 [P.O.:120] Out: 603 [Urine:600; Stool:3]  Intake/Output this shift: Total I/O In: -  Out: 300 [Urine:300]  Lab Results: Recent Labs    07/12/20 0412 07/12/20 1700 07/13/20 0437  WBC 23.2* 37.5* 34.2*  HGB 8.5* 9.2* 8.2*  HCT 26.9* 28.2* 25.5*  PLT 203 240 215   BMET Recent Labs    07/12/20 0412 07/12/20 1700 07/13/20 0437  NA 135 137 138  K 4.3 4.1 4.2  CL 103 102 107  CO2 23 23 23   GLUCOSE 138* 183* 171*  BUN 20 24* 25*  CREATININE 0.63 0.60 0.67  CALCIUM 8.0* 8.3* 8.0*   LFT Recent Labs    07/18/2020 1031 07/27/2020 1755 07/12/20 0412 07/12/20 1700 07/13/20 0437  PROT 4.6*   < > 4.3* 4.7* 4.3*  ALBUMIN 2.1*   < > 1.9* 2.1* 1.8*  AST 525*   < > 726* 850* 781*  ALT 252*   < > 347* 453* 465*  ALKPHOS 46   < > 45 55 54    BILITOT 0.5   < > 0.7 0.3 0.6  BILIDIR 0.2  --   --   --   --   IBILI 0.3  --   --   --   --    < > = values in this interval not displayed.   PT/INR Recent Labs    07/12/20 0412 07/13/20 0437  LABPROT 18.6* 15.1  INR 1.6* 1.2   Hepatitis Panel Recent Labs    07/12/20 1700  HEPBSAG NON REACTIVE  HCVAB NON REACTIVE  HEPAIGM NON REACTIVE  HEPBIGM NON REACTIVE    Studies/Results: CT ABDOMEN PELVIS W CONTRAST  Result Date: 07/12/2020 CLINICAL DATA:  Abdominal distention EXAM: CT ABDOMEN AND PELVIS WITH CONTRAST TECHNIQUE: Multidetector CT imaging of the abdomen and pelvis was performed using the standard protocol following bolus administration of intravenous contrast. CONTRAST:  171mL OMNIPAQUE IOHEXOL 300 MG/ML  SOLN COMPARISON:  None. FINDINGS: Lower chest: Trace bilateral pleural effusions. Dependent atelectasis. Densely calcified mitral valve. Hepatobiliary: Small gallstones layering within the gallbladder. No focal hepatic abnormality. Pancreas: No focal abnormality or ductal dilatation. Spleen: No focal abnormality.  Normal size. Adrenals/Urinary Tract: Small cyst off the upper pole of the right  kidney. No hydronephrosis. No renal or ureteral stones. Urinary bladder and adrenal glands unremarkable. Stomach/Bowel: Few scattered left colonic diverticula. No active diverticulitis. Stomach and small bowel decompressed. No bowel obstruction. Vascular/Lymphatic: Aortic atherosclerosis. No evidence of aneurysm or adenopathy. Reproductive: Prior hysterectomy.  No adnexal masses. Other: No free fluid or free air. Musculoskeletal: Bilateral hip replacements. Postoperative changes in the lower lumbar spine. Mild compression fracture through the inferior endplate of L1 and superior endplate of W41. IMPRESSION: Trace bilateral pleural effusions.  Bibasilar atelectasis. Cholelithiasis. Aortic atherosclerosis. No acute findings in the abdomen or pelvis. Electronically Signed   By: Rolm Baptise M.D.    On: 07/12/2020 02:14   DG CHEST PORT 1 VIEW  Result Date: 07/13/2020 CLINICAL DATA:  Shortness of breath.  Weakness. EXAM: PORTABLE CHEST 1 VIEW COMPARISON:  07/01/2020 FINDINGS: Midline trachea. Mild cardiomegaly. Atherosclerosis in the transverse aorta. Mitral annular calcifications. Median sternotomy for CABG. No pleural fluid. Biapical pleuroparenchymal scarring. No congestive failure. Mild scarring or atelectasis at the lateral left lung base. IMPRESSION: Cardiomegaly, without congestive failure. Aortic Atherosclerosis (ICD10-I70.0). Electronically Signed   By: Abigail Miyamoto M.D.   On: 07/13/2020 10:41   US LIVER DOPPLER  Result Date: 07/13/2020 CLINICAL DATA:  Elevated LFTs EXAM: DUPLEX ULTRASOUND OF LIVER TECHNIQUE: Color and duplex Doppler ultrasound was performed to evaluate the hepatic in-flow and out-flow vessels. COMPARISON:  07/12/2020 FINDINGS: Liver: Normal parenchymal echogenicity. Normal hepatic contour without nodularity. No focal lesion, mass or intrahepatic biliary ductal dilatation. Main Portal Vein size: 1.0 cm Portal Vein Velocities Main Prox:  54 cm/sec Main Mid: 51 cm/sec Main Dist:  45 cm/sec Right: 34 cm/sec Left: 22 cm/sec Hepatic Vein Velocities Right:  22 cm/sec Middle:  32 cm/sec Left:  40 cm/sec IVC: Present and patent with normal respiratory phasicity. Hepatic Artery Velocity:  57 cm/sec Splenic Vein Velocity:  19 cm/sec Spleen: 2.6 cm x 6.8 cm x 7.4 cm with a total volume of 70 cm^3 (411 cm^3 is upper limit normal) Portal Vein Occlusion/Thrombus: No Splenic Vein Occlusion/Thrombus: No Ascites: None Varices: None Patent portal, hepatic and splenic veins with directional flow. Negative portal vein occlusion or thrombus. No ascites. Small left effusion noted. IMPRESSION: Normal hepatic venous Doppler. Electronically Signed   By: Jerilynn Mages.  Shick M.D.   On: 07/13/2020 08:24   VAS [place] LOWER EXTREMITY VENOUS (DVT)  Result Date: 07/12/2020 Summary: RIGHT: - There is no evidence of deep  vein thrombosis in the lower extremity.  - No cystic structure found in the popliteal fossa.  LEFT: - Findings consistent with age indeterminate deep vein thrombosis involving the left posterior tibial veins, left peroneal veins, and left gastrocnemius veins. - No cystic structure found in the popliteal fossa.  *See table(s) above for measurements and observations. Electronically signed by Curt Jews MD on 07/12/2020 at 3:05:07 PM.    Final     Scheduled Meds: . allopurinol  200 mg Oral Daily  . docusate sodium  100 mg Oral BID  . folic acid  2 mg Oral Daily  . furosemide  40 mg Intravenous BID  . gabapentin  100 mg Oral BID  . gabapentin  300 mg Oral QHS  . insulin aspart  0-9 Units Subcutaneous TID WC  . insulin glargine  10 Units Subcutaneous QPM  . liver oil-zinc oxide  1 application Topical TID  . melatonin  3 mg Oral QHS  . metoCLOPramide  5 mg Oral QHS  . metoprolol tartrate  12.5 mg Oral BID  . pantoprazole (PROTONIX)  IV  40 mg Intravenous Q12H  . phytonadione  5 mg Oral Daily   Continuous Infusions: . albumin human    . lactated ringers 200 mL/hr at 07/13/20 0818  . methylPREDNISolone (SOLU-MEDROL) injection     PRN Meds:.acetaminophen **OR** acetaminophen, albuterol, bisacodyl, hydrALAZINE, morphine injection, ondansetron **OR** ondansetron (ZOFRAN) IV, oxyCODONE, polyethylene glycol, senna-docusate, tiZANidine, traMADol   ASSESMENT:   *   Acute rhabdomyolysis with myositis/myopathy.  Plan 1 g IV Solu-Medrol daily for 5 days through 11/9.  Gabapentin added for pain management.  *    Rising transaminases. Hypoalbuminemia.   CT with unremarkable liver, biliary tree, pancreatic duct but does reveal uncomplicated gallbladder stones. Awaiting completion of ultrasound liver Doppler. Doubt acute hepatitis but still needs acute hepatitis serologies.  *   Dark, heme positive stools in patient with recent dx gastritis and duodenal ulcers. 06/17/2020 EGD confirmed nonbleeding,  clean-based, duodenal ulcers and H. pylori gastritis.  Completed H. pylori eradication with Biaxin, amoxicillin, metronidazole and continues on bid Protonix 40 mg, currently IV dosing.    *    Macrocytic anemia. Hgb 8.8 >> 8.2.   EGD 1 month ago with findings noted above Colonoscopy 1 month ago with removal of adenomatous polyp and prophylactic Endo clipping of polypectomy site, sigmoid diverticulosis, internal hemorrhoids.  *    Acute left LE DVT, started Eliquis during hospitalization ending 07/06/2020. Repeat doppler 11/3:  Lingering LLE DVT  *    Coagulopathy. Resolved.  INR 1.6 >> 1.2  *    CKD.   *    Protein calorie malnutrition of unscored severity in setting of anorexia and poor p.o. intake  *   RA.   Meds include low-dose prednisone, Plaquenil and methotrexate.  Patient and daughter report the dose of methotrexate was lowered after hip surgery.   *   Intermittent dysphagia with choking on pills and delay in passage of meats. Bedside SLP study with mild aspiration risk but recommend MBS for further assessment.  Suspect  Esophageal dysmotility related to myositis.    *   IDDM.  Expect this to worsen in setting of high dose steroid   PLAN   *   Continue Protonix 40 mg bid, will switch back to po.   Expect transaminases to improve along with the myositis and rhabdo.    No EGD for now but if Hgb continues to downtrend along with black stool, consider repeat EGD.      Azucena Freed  07/13/2020, 11:25 AM Phone [phone]    Attending physician's note   I have taken an interval history, reviewed the chart and examined the patient. I agree with the Advanced Practitioner's note, impression and recommendations.   Rhabdomyolysis with myositis/myopathy.  Heme pos stools in pt with a recent EGD 06/17/2020 showing clean-based DUs and HP gastritis.  Rx with triple drug therapy and continued on Protonix. No active bleeding.  Neg colon 06/17/2020 except for sig div,  significant polyp s/p polypectomy. Neg CT AP with contrast.  Abn LFTs ?  Etiology (DILI vs shock liver vs rhabdo myositis).  Liver Nl on CT AP. She has been on methotrexate.  DVT on on heparin.   Associated CKD, [name], RA, HTN, [name], DM, CAD, dCHF  Had episode of choking on pills. Neg speech eval. Neg EGD 06/17/2020 for esophageal stricture  Plan: -Would recommend supportive treatment for now. -Trend CBC, LFTs. -Continue Protonix 40 twice daily. -Scope only if there is active bleeding or significant drop in Hb.  -Avoid  hepatotoxic medications.  Not a candidate for liver bx.    Carmell Austria, MD [facility]

## 2020-07-14 DIAGNOSIS — M6282 Rhabdomyolysis: Secondary | ICD-10-CM | POA: Diagnosis not present

## 2020-07-14 DIAGNOSIS — K5909 Other constipation: Secondary | ICD-10-CM | POA: Diagnosis not present

## 2020-07-14 DIAGNOSIS — R7989 Other specified abnormal findings of blood chemistry: Secondary | ICD-10-CM | POA: Diagnosis not present

## 2020-07-14 LAB — COMPREHENSIVE METABOLIC PANEL
ALT: 518 U/L — ABNORMAL HIGH (ref 0–44)
AST: 736 U/L — ABNORMAL HIGH (ref 15–41)
Albumin: 1.8 g/dL — ABNORMAL LOW (ref 3.5–5.0)
Alkaline Phosphatase: 54 U/L (ref 38–126)
Anion gap: 14 (ref 5–15)
BUN: 33 mg/dL — ABNORMAL HIGH (ref 8–23)
CO2: 19 mmol/L — ABNORMAL LOW (ref 22–32)
Calcium: 8 mg/dL — ABNORMAL LOW (ref 8.9–10.3)
Chloride: 107 mmol/L (ref 98–111)
Creatinine, Ser: 0.64 mg/dL (ref 0.44–1.00)
GFR, Estimated: >60 mL/min (ref >60)
Glucose, Bld: 189 mg/dL — ABNORMAL HIGH (ref 70–99)
Potassium: 4.4 mmol/L (ref 3.5–5.1)
Sodium: 140 mmol/L (ref 135–145)
Total Bilirubin: 0.3 mg/dL (ref 0.3–1.2)
Total Protein: 4.1 g/dL — ABNORMAL LOW (ref 6.5–8.1)

## 2020-07-14 LAB — PROTIME-INR
INR: 1.4 — ABNORMAL HIGH (ref 0.8–1.2)
Prothrombin Time: 16.4 seconds — ABNORMAL HIGH (ref 11.4–15.2)

## 2020-07-14 LAB — CBC
HCT: 25.6 % — ABNORMAL LOW (ref 36.0–46.0)
Hemoglobin: 8.3 g/dL — ABNORMAL LOW (ref 12.0–15.0)
MCH: 34.3 pg — ABNORMAL HIGH (ref 26.0–34.0)
MCHC: 32.4 g/dL (ref 30.0–36.0)
MCV: 105.8 fL — ABNORMAL HIGH (ref 80.0–100.0)
Platelets: 218 10*3/uL (ref 150–400)
RBC: 2.42 MIL/uL — ABNORMAL LOW (ref 3.87–5.11)
RDW: 19.4 % — ABNORMAL HIGH (ref 11.5–15.5)
WBC: 32 10*3/uL — ABNORMAL HIGH (ref 4.0–10.5)
nRBC: 0.2 % (ref 0.0–0.2)

## 2020-07-14 LAB — MYOGLOBIN, SERUM
Myoglobin: >30000 ng/mL — ABNORMAL HIGH (ref 25–58)
Myoglobin: >30000 ng/mL — ABNORMAL HIGH (ref 25–58)

## 2020-07-14 LAB — CK: Total CK: 25849 U/L — ABNORMAL HIGH (ref 38–234)

## 2020-07-14 LAB — GLUCOSE, CAPILLARY
Glucose-Capillary: 179 mg/dL — ABNORMAL HIGH (ref 70–99)
Glucose-Capillary: 164 mg/dL — ABNORMAL HIGH (ref 70–99)
Glucose-Capillary: 198 mg/dL — ABNORMAL HIGH (ref 70–99)
Glucose-Capillary: 229 mg/dL — ABNORMAL HIGH (ref 70–99)

## 2020-07-14 LAB — APTT
aPTT: 153 seconds — ABNORMAL HIGH (ref 24–36)
aPTT: 145 seconds — ABNORMAL HIGH (ref 24–36)
aPTT: >200 seconds (ref 24–36)

## 2020-07-14 LAB — HEPARIN LEVEL (UNFRACTIONATED): Heparin Unfractionated: 2.08 IU/mL — ABNORMAL HIGH (ref 0.30–0.70)

## 2020-07-14 MED ORDER — DOCUSATE SODIUM 50 MG/5ML PO LIQD
100.0000 mg | Freq: Two times a day (BID) | ORAL | Status: DC
  Administered 2020-07-14 (×2): 100 mg via ORAL
  Filled 2020-07-14 (×3): qty 10

## 2020-07-14 MED ORDER — PANTOPRAZOLE SODIUM 40 MG PO PACK
40.0000 mg | PACK | Freq: Two times a day (BID) | ORAL | Status: DC
  Administered 2020-07-14 – 2020-07-15 (×3): 40 mg via ORAL
  Filled 2020-07-14 (×3): qty 20

## 2020-07-14 MED ORDER — HEPARIN (PORCINE) 25000 UT/250ML-% IV SOLN
600.0000 [IU]/h | INTRAVENOUS | Status: DC
  Administered 2020-07-14: 800 [IU]/h via INTRAVENOUS

## 2020-07-14 MED ORDER — PHENOL 1.4 % MT LIQD: 1.0000 | OROMUCOSAL | Status: DC | PRN

## 2020-07-14 MED ORDER — PANTOPRAZOLE SODIUM 40 MG PO TBEC: 40.0000 mg | DELAYED_RELEASE_TABLET | Freq: Two times a day (BID) | ORAL | Status: DC

## 2020-07-14 NOTE — Evaluation (Signed)
 Occupational Therapy Evaluation Patient Details Name: Patricia Villegas MRN: [id number] DOB: 1935/04/09 Today's Date: 07/14/2020    History of Present Illness Pt is an 85 y.o. female admitted 08/05/2020 with leg pain and worsening weakness. Workup for severe rhabdomyolysis, presumed myositis. PMH includes RA, [name], DM, HTN, CAD s/p CABG, R THA (direct anterior 05/2020). Of note, 2x recent admissions 06/2020 with weakness, DVT, subacute L1 compression fx.   Clinical Impression   Pt presents with major deconditioning with deficits in strength, ROM, coordination, balance, and endurance. Pt has had multiple admissions and discharge to rehab recently. Previously, pt able to walk with a walker. Now, pt requires Total A x 2 for all bed mobility and Total A to maintain sitting balance EOB. Pt requires assistance to maintain head in upright position sitting EOB for tasks. Pt with major limitations in ROM/strength of B UE (<5* active ROM of shoulders/elbows, PROM WFL) requiring Max A for self feeding and basic grooming tasks. Pt requires Total A for all other ADLs. Pt also noted with swelling in B UE. Daughter in law present during session and educated on elevation of B UE and PROM stretching without pt's tolerance to decrease swelling/stiffness. Recommend discharge to SNF for short term rehab. Plan to further train in [place] for BUE, increase endurance for sitting EOB and address compensatory strategies to increase independence with simple ADLs.     Follow Up Recommendations  SNF;Supervision/Assistance - 24 hour    Equipment Recommendations  Wheelchair (measurements OT);Wheelchair cushion (measurements OT);Hospital bed    Recommendations for Other Services       Precautions / Restrictions Precautions Precautions: Fall Precaution Comments: back precautions for pain control due to L1 subacute compression fx Required Braces or Orthoses: Cervical Brace Cervical Brace: Soft collar (when eating to  decrease risk for aspiration) Restrictions Weight Bearing Restrictions: No      Mobility Bed Mobility Overal bed mobility: Needs Assistance Bed Mobility: Supine to Sit;Sit to Supine     Supine to sit: Total assist;+2 for physical assistance;+2 for safety/equipment;HOB elevated Sit to supine: Total assist;+2 for physical assistance;+2 for safety/equipment   General bed mobility comments: Total A x 2 for bed mobility, heavy assist for advancing B LE and trunk. Pt unable to reach to bedrail to assist due to poor ROM/strength of B UE    Transfers                 General transfer comment: deferred- will be a maximove transfer    Balance Overall balance assessment: Needs assistance Sitting-balance support: Bilateral upper extremity supported;Feet supported Sitting balance-Leahy Scale: Zero Sitting balance - Comments: Total A to maintain sitting balance EOB with heavy posterior lean. Pt unable to hold head up without physical assist  Postural control: Posterior lean                                 ADL either performed or assessed with clinical judgement   ADL Overall ADL's : Needs assistance/impaired Eating/Feeding: Maximal assistance;Bed level   Grooming: Maximal assistance;Sitting;Bed level Grooming Details (indicate cue type and reason): Max A with HOH to wipe nose due ROM/strength impairments Upper Body Bathing: Bed level;Total assistance   Lower Body Bathing: Total assistance;Bed level   Upper Body Dressing : Total assistance;Bed level   Lower Body Dressing: Total assistance;Bed level Lower Body Dressing Details (indicate cue type and reason): Total A to don socks     Toileting-  Clothing Manipulation and Hygiene: Total assistance;Bed level         General ADL Comments: Pt with severe deconditioning, decreased B UE/LE ROM/strength and poor core strength. Inability to hold head up without physical assist      Vision Patient Visual Report: No  change from baseline Vision Assessment?: No apparent visual deficits     Perception     Praxis      Pertinent Vitals/Pain Pain Assessment: Faces Faces Pain Scale: No hurt Pain Location: R hip Pain Descriptors / Indicators: Discomfort;Grimacing;Sharp Pain Intervention(s): Limited activity within patient's tolerance;Monitored during session;Repositioned     Hand Dominance Right   Extremity/Trunk Assessment Upper Extremity Assessment Upper Extremity Assessment: RUE deficits/detail;LUE deficits/detail RUE Deficits / Details: R UE shoulder flexion <5* active, PROM to 110* before pain began, <5* active elbow flexion, PROM elbow WFL. Swelling, redness and bruising noted in forearem. R>L. swollen hands but grip strength fair, difficulty with opposition. LUE Deficits / Details: R UE shoulder flexion <5* active, PROM to 110* before pain began, <5* active elbow flexion, PROM elbow WFL. Swelling, redness and bruising noted in forearem. R>L. swollen hands but grip strength fair, difficulty with opposition.   Lower Extremity Assessment Lower Extremity Assessment: Defer to PT evaluation   Cervical / Trunk Assessment Cervical / Trunk Assessment: Kyphotic (including head forward)   Communication Communication Communication: No difficulties   Cognition Arousal/Alertness: Awake/alert Behavior During Therapy: WFL for tasks assessed/performed Overall Cognitive Status: Within Functional Limits for tasks assessed                                 General Comments: most likely baseline memory impairment; decreased STM during session   General Comments  Pt noted with B UE swelling/redness. Pt's daughter in law present. Educated both on AROM/PROM of B UE to prevent stiffness, elevation to decrease swelling     Exercises     Shoulder Instructions      Home Living Family/patient expects to be discharged to:: Skilled nursing facility Living Arrangements: Spouse/significant  other Available Help at Discharge: Family Type of Home: House Home Access: Stairs to enter Technical  of Steps: 4 Entrance Stairs-Rails: Can reach both Home Layout: Able to live on main level with bedroom/bathroom     Bathroom Shower/Tub: Occupational psychologist: Standard     Home Equipment: Environmental consultant - 2 wheels;Cane - single point;Bedside commode;Shower seat          Prior Functioning/Environment Level of Independence: Needs assistance  Gait / Transfers Assistance Needed: ambulated household distances with RW ADL's / Homemaking Assistance Needed: husband assisting with ADLs and family providing iADLs   Comments: After THA in September, pt entered rehab then discharged home, readmitted again. Prior to most recent decline.         OT Problem List: Decreased strength;Decreased range of motion;Decreased activity tolerance;Impaired balance (sitting and/or standing);Decreased safety awareness;Decreased knowledge of use of DME or AE;Pain;Decreased coordination;Impaired UE functional use      OT Treatment/Interventions: Self-care/ADL training;Therapeutic exercise;Energy conservation;DME and/or AE instruction;Therapeutic activities;Patient/family education;Balance training    OT Goals(Current goals can be found in the care plan section) Acute Rehab OT Goals Patient Stated Goal: go to rehab, be able to stand and walk again OT Goal Formulation: With patient/family Time For Goal Achievement: August 12, 2020 Potential to Achieve Goals: Good ADL Goals Pt Will Perform Eating: with min assist;bed level Pt Will Perform Grooming: with min assist;bed level Pt Will  Perform Upper Body Bathing: with mod assist;bed level Pt Will Perform Upper Body Dressing: with mod assist;bed level Pt/caregiver will Perform Home Exercise Program: Increased ROM;Increased strength;Both right and left upper extremity;With Supervision;With written HEP provided Additional ADL Goal #1: Pt to demonstrate  ability to sit EOB for >3 minutes with no more than Mod A to increase participation in ADL tasks.  OT Frequency: Min 2X/week   Barriers to D/C:            Co-evaluation              AM-PAC OT "6 Clicks" Daily Activity     Outcome Measure Help from another person eating meals?: A Lot Help from another person taking care of personal grooming?: A Lot Help from another person toileting, which includes using toliet, bedpan, or urinal?: Total Help from another person bathing (including washing, rinsing, drying)?: Total Help from another person to put on and taking off regular upper body clothing?: Total Help from another person to put on and taking off regular lower body clothing?: Total 6 Click Score: 8   End of Session Nurse Communication: Mobility status;Need for lift equipment  Activity Tolerance: Patient limited by fatigue Patient left: in bed;with call bell/phone within reach;with bed alarm set;with family/visitor present  OT Visit Diagnosis: Unsteadiness on feet (R26.81);Other abnormalities of gait and mobility (R26.89);History of falling (Z91.81);Muscle weakness (generalized) (M62.81);Pain;Dizziness and giddiness (R42) Pain - Right/Left: Right Pain - part of body: Hip                Time: 4715-9539 OT Time Calculation (min): 26 min Charges:  OT General Charges $OT Visit: 1 Visit OT Evaluation $OT Eval Moderate Complexity: 1 Mod  Layla Maw, OTR/L  Layla Maw 07/14/2020, 12:52 PM

## 2020-07-14 NOTE — Progress Notes (Signed)
[place] for Heparin Indication: Conversion from apixaban to heparin for recent DVT in setting of impending procedure.   No Known Allergies  Patient Measurements: Height: 5\' 6"  (167.6 cm) Weight: 66.2 kg (146 lb) IBW/kg (Calculated) : 59.3 Heparin Dosing Weight: 66.2  Vital Signs: Temp: 97.8 F (36.6 C) (11/05 0726) Temp Source: Oral (11/05 0726) BP: 122/63 (11/05 0726) Pulse Rate: 72 (11/05 0726)  Labs: Recent Labs    07/12/20 0412 07/12/20 0412 07/12/20 1700 07/13/20 0437 07/13/20 0437 07/13/20 1651 07/13/20 2058 07/14/20 0342 07/14/20 0633  HGB 8.5*  --    < > 8.2*   < > 9.4*  --  8.3*  --   HCT 26.9*  --    < > 25.5*  --  28.7*  --  25.6*  --   PLT 203  --    < > 215  --  207  --  218  --   APTT 29   < >  --   --   --   --  50* 153* 145*  LABPROT 18.6*  --   --  15.1  --   --   --  16.4*  --   INR 1.6*  --   --  1.2  --   --   --  1.4*  --   HEPARINUNFRC  --   --   --   --   --   --   --  2.08*  --   CREATININE 0.63  --    < > 0.67  --  0.68  --  0.64  --   CKTOTAL 23,406*  --    < > 27,326*  --   --  66,815* 25,849*  --    < > = values in this interval not displayed.    Estimated Creatinine Clearance: 49.9 mL/min (by C-G formula based on SCr of 0.64 mg/dL).  [name]; [name]; DM; HTN; [name]; and CAD s/p CABG presenting with leg pain. Patient with possible myositis with possible procedure due. Started on apixaban recently within last few weeks for acute left leg DVT. Patient received last dose of apixaban at ~0930 this AM. Will start heparin drip 12 hours after last dose of apixaban.  Heparin stopped due to melana in the stool.  Will restart with no bolus.   Daily aptt 145 -  no issues, no bleeding. Heparin level elevated as expected with recent apixaban use.  Goal of Therapy:  Heparin level 0.3-0.7 APTT 66-102 sec Monitor platelets by anticoagulation  protocol: Yes   Plan:  Decrease heparin to 1000 units/hr APTT in 8 hours Daily hep lvl cbc aptt  Alanda Slim, PharmD, [facility] Clinical Pharmacist Please see AMION for all Pharmacists' Contact Phone Numbers 07/14/2020, 9:47 AM

## 2020-07-14 NOTE — Progress Notes (Signed)
Lab reported critical Ptt of 200 ,pharmacy notified Orders were to stop heparin drip for one hour and resume drip at 8 Ml/hour. On call physician also notified.

## 2020-07-14 NOTE — Evaluation (Signed)
Physical Therapy Evaluation Patient Details Name: Patricia Villegas MRN: [id number] DOB: 11-21-1934 Today's Date: 07/14/2020   History of Present Illness  Pt is an 85 y.o. female admitted 07/10/2020 with leg pain and worsening weakness. Workup for severe rhabdomyolysis, presumed myositis. PMH includes RA, [name], DM, HTN, CAD s/p CABG, R THA (direct anterior 05/2020). Of note, 2x recent admissions 06/2020 with weakness, DVT, subacute L1 compression fx.    Clinical Impression  Pt presents with an overall decrease in functional mobility secondary to above. Since recent admission 06/2020, pt has been at [facility] for rehab due to significant weakness; prior to this, was ambulatory with RW. Today, pt required maxA+2 to [place] for bed mobility and sitting balance; will require maximove lift for transfers OOB. Session focused on PROM/AAROM to all extremities and core/neck, pt limited by pain, weakness and swelling. Pt repositioned to encourage midline posture; RN to order PRAFOs and SCDs. Pt would benefit from continued acute PT services to maximize functional mobility and independence prior to d/c with continued SNF-level therapies.     Follow Up Recommendations SNF;Supervision for mobility/OOB    Equipment Recommendations   (defer)    Recommendations for Other Services       Precautions / Restrictions Precautions Precautions: Fall Precaution Comments: back precautions for pain control due to L1 subacute compression fx; recent R THA 05/2020 (direct anterior, WBAT) Required Braces or Orthoses: Cervical Brace Cervical Brace: Soft collar (when eating to decrease risk for aspiration) Restrictions Weight Bearing Restrictions: No      Mobility  Bed Mobility Overal bed mobility: Needs Assistance Bed Mobility: Supine to Sit;Sit to Supine     Supine to sit: Total assist;+2 for physical assistance;+2 for safety/equipment;HOB elevated Sit to supine: Total assist;+2 for physical assistance;+2 for  safety/equipment   General bed mobility comments: Total A x 2 for bed mobility, heavy assist for advancing B LE and trunk. Pt unable to reach to bedrail to assist due to poor ROM/strength of B UE    Transfers                 General transfer comment: Limited by fatigue this session. Will likely require maximove lift for OOB transfers  Ambulation/Gait                Stairs            Wheelchair Mobility    Modified Rankin (Stroke Patients Only)       Balance Overall balance assessment: Needs assistance Sitting-balance support: Bilateral upper extremity supported;Feet supported Sitting balance-Leahy Scale: Zero Sitting balance - Comments: Total A to maintain sitting balance EOB with heavy posterior lean. Pt unable to hold head up without physical assist                                      Pertinent Vitals/Pain Pain Assessment: Faces Faces Pain Scale: Hurts even more Pain Location: All over, especially BUEs, BLEs, R hip, back Pain Descriptors / Indicators: Discomfort;Grimacing;Guarding Pain Intervention(s): Monitored during session;Limited activity within patient's tolerance;Repositioned    Home Living Family/patient expects to be discharged to:: Skilled nursing facility Living Arrangements: Spouse/significant other Available Help at Discharge: Family;Available PRN/intermittently                  Prior Function Level of Independence: Needs assistance   Gait / Transfers Assistance Needed: After R THA (05/2020), d/c home with HHPT and was  ambulating household distances with RW. Since recent admission 06/2020, d/c to [facility] for rehab; was working on sitting EOB, lifted 1x to recliner (per daughter-in-law)  ADL's / Homemaking Assistance Needed: After R THA, husband assisting with ADLs, family providing assist for iADLs. Since being at SNF for rehab, dependent for majority of ADLs due to worsening weakness        Hand Dominance    Dominant Hand: Right    Extremity/Trunk Assessment   Upper Extremity Assessment Upper Extremity Assessment: RUE deficits/detail;LUE deficits/detail RUE Deficits / Details: R UE shoulder flexion <5* active, PROM to 110* before pain began, <5* active elbow flexion, PROM elbow WFL. Swelling, redness and bruising noted in forearem. R>L. swollen hands but grip strength fair, difficulty with opposition. RUE Coordination: decreased fine motor;decreased gross motor LUE Deficits / Details: R UE shoulder flexion <5* active, PROM to 110* before pain began, <5* active elbow flexion, PROM elbow WFL. Swelling, redness and bruising noted in forearem. R>L. swollen hands but grip strength fair, difficulty with opposition. LUE Coordination: decreased fine motor;decreased gross motor    Lower Extremity Assessment Lower Extremity Assessment: RLE deficits/detail;LLE deficits/detail RLE Deficits / Details: Functionally <2/5 throughout; noteable pain with R ankle DF stretch and R knee flexion; pitting edema throughout, especially lower leg/foot RLE Coordination: decreased gross motor;decreased fine motor LLE Deficits / Details: Functionally <2/5 throughout; noteable pain with L ankle DF stretch; pitting edema throughout, especially lower leg/foot LLE Coordination: decreased gross motor;decreased fine motor    Cervical / Trunk Assessment Cervical / Trunk Assessment: Kyphotic;Other exceptions Cervical / Trunk Exceptions: Significant weakness - unable to extend next even with assist of cervical soft collar; forward head/rounded shoulders  Communication   Communication: No difficulties  Cognition Arousal/Alertness: Awake/alert Behavior During Therapy: WFL for tasks assessed/performed Overall Cognitive Status: Impaired/Different from baseline                                 General Comments: most likely baseline memory impairment; decreased STM during session      General Comments General  comments (skin integrity, edema, etc.): PF contractures noted, BLE pitting edema with minimal active movement - spoke with RN who will order prevalon boots and SCDs; bilateral ankles positioned to float for pressure relief; bilateral hips positioned to encourage neutral rotation. Daughter-in-law present and supportive.    Exercises Other Exercises Other Exercises: BLE AAROM knee flex/ext, PROM knee flex with hold for stretch, PROM gastroc and soleus stretches   Assessment/Plan    PT Assessment Patient needs continued PT services  PT Problem List Decreased strength;Decreased range of motion;Decreased activity tolerance;Decreased balance;Decreased mobility;Decreased knowledge of use of DME;Pain;Decreased skin integrity       PT Treatment Interventions DME instruction;Gait training;Functional mobility training;Therapeutic activities;Therapeutic exercise;Balance training;Neuromuscular re-education;Patient/family education;Wheelchair mobility training    PT Goals (Current goals can be found in the Care Plan section)  Acute Rehab PT Goals Patient Stated Goal: go to rehab, be able to stand and walk again PT Goal Formulation: With patient/family Time For Goal Achievement: August 21, 2020 Potential to Achieve Goals: Fair    Frequency Min 2X/week   Barriers to discharge        Co-evaluation               AM-PAC PT "6 Clicks" Mobility  Outcome Measure Help needed turning from your back to your side while in a flat bed without using bedrails?: Total Help needed moving from lying on  your back to sitting on the side of a flat bed without using bedrails?: Total Help needed moving to and from a bed to a chair (including a wheelchair)?: Total Help needed standing up from a chair using your arms (e.g., wheelchair or bedside chair)?: Total Help needed to walk in hospital room?: Total Help needed climbing 3-5 steps with a railing? : Total 6 Click Score: 6    End of Session   Activity Tolerance:  Patient limited by fatigue;Patient limited by pain Patient left: in bed;with bed alarm set;with call bell/phone within reach;with family/visitor present Nurse Communication: Mobility status;Need for lift equipment PT Visit Diagnosis: Other abnormalities of gait and mobility (R26.89);Muscle weakness (generalized) (M62.81);Pain    Time: 4431-5400 PT Time Calculation (min) (ACUTE ONLY): 25 min   Charges:   PT Evaluation $PT Eval Moderate Complexity: [place], PT, DPT Acute Rehabilitation Services  Pager [phone] Office [place] 07/14/2020, 1:34 PM

## 2020-07-14 NOTE — Progress Notes (Signed)
Neurology Progress Note  Patient ID: Patricia Villegas is a 85 y.o. with Patricia Villegas is a 85 y.o. female with history of [name], DVT on Eliquis, rheumatoid arthritis, hypertension, hypercholesterolemia, CAD, recent right total hip arthroplasty, recent GI bleed secondary to H. Pylori infection, anemia, diabetes, coronary artery disease status post CABG.   Initially consulted for: c/f myositis given elevated CK  Major interval events/subjective:  - started steroids, tolerating well without headache, hallucinations, agitation, worsening melena - not overly sedated with gabapentin, pain is improving - complains of dry throat, swollen hip - receiving vitamin K for INR elevation  Exam: Vitals:   07/13/20 1925 07/14/20 0559  BP: (!) 152/63 121/62  Pulse: 81 69  Resp: 16 15  Temp: 98.2 F (36.8 C) 97.6 F (36.4 C)  SpO2: 92% 100%   Gen: In bed, comfortable Affect: Flat today Resp: non-labored breathing, no grossly audible wheezing but somewhat tachypenic today Cardiac: Perfusing extremities well.  Abd: soft, nt Extremities: scattered ecchymosis. Diffuse pitting edema  Neuro: MS: Alert awake and oriented to person, place, time, situation CN: Face symmetric, EOMI intact to tracking examiner Motor: Proximally 1/5 in the arms, distally 4/5. In the legs proximally 1/5, distally 4/5   Pertinent Labs: CK stabilizing/falling peak 31,948 11/3 at 17:00, currently 25,849 WBC stable in the low 30s Platelets, Hgb stable    Ref. Range 07/31/2020 17:55  LDH Latest Ref Range: 98 - 192 U/L 730 (H)  Myoglobin Latest Ref Range: 25 - 58 ng/mL 16,784 (H)  Aldolase Latest Ref Range: 3.3 - 10.3 U/L 77.0 (H)  GGT 17 Results for [name], [name] (MRN [id number]) as of 07/14/2020 08:56  Ref. 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 Myasthenia can also have proximal muscle weakness though typically would present with ocular symptoms, however given the possibility of an atypical presentation, will additionally send MG antibodies as add on to pre-steroid labs today. 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

## 2020-07-14 NOTE — Progress Notes (Addendum)
Daily Rounding Note  07/14/2020, 8:32 AM  LOS: 3 days   SUBJECTIVE:   Chief complaint:   Elevated transaminitis.  Myopathy.  dysphagia   Feeling better as limb pain is improved.  No change in limb weakness.  Tolerating D2 diet.   Stools less dark.    OBJECTIVE:         Vital signs in last 24 hours:    Temp:  [97.6 F (36.4 C)-98.2 F (36.8 C)] 97.8 F (36.6 C) (11/05 0726) Pulse Rate:  [68-81] 72 (11/05 0726) Resp:  [15-17] 16 (11/05 0726) BP: (118-152)/(62-63) 122/63 (11/05 0726) SpO2:  [92 %-100 %] 100 % (11/05 0726) Last BM Date: 07/12/20 Filed Weights   08/04/2020 0031  Weight: 66.2 kg   General: alert, in good spirits.  Not looking acutely ill.     Heart: RRR Chest: clear bil in front.  No cough or labored breathing Abdomen: soft, NT, ND, active BS  Extremities: + edema x 4 limbs.  Purpera/bruising on limbs and upper front torso.   Neuro/Psych:  Pleasant, no confusion, appropriate, calm.  Weakness in all 4 limbs continues.    Intake/Output from previous day: 11/04 0701 - 11/05 0700 In: 5321.6 [P.O.:240; I.V.:5004.9; IV Piggyback:76.7] Out: 1250 [Urine:1250]  Intake/Output this shift: No intake/output data recorded.  Lab Results: Recent Labs    07/13/20 0437 07/13/20 1651 07/14/20 0342  WBC 34.2* 28.9* 32.0*  HGB 8.2* 9.4* 8.3*  HCT 25.5* 28.7* 25.6*  PLT 215 207 218   BMET Recent Labs    07/12/20 1700 07/13/20 0437 07/13/20 1651  NA 137 138 138  K 4.1 4.2 4.4  CL 102 107 106  CO2 23 23 23   GLUCOSE 183* 171* 203*  BUN 24* 25* 27*  CREATININE 0.60 0.67 0.68  CALCIUM 8.3* 8.0* 8.0*   LFT Recent Labs    07/17/2020 1031 07/23/2020 1755 07/12/20 1700 07/13/20 0437 07/13/20 1651  PROT 4.6*   < > 4.7* 4.3* 4.3*  ALBUMIN 2.1*   < > 2.1* 1.8* 1.8*  AST 525*   < > 850* 781* 876*  ALT 252*   < > 453* 465* 524*  ALKPHOS 46   < > 55 54 57  BILITOT 0.5   < > 0.3 0.6 0.4  BILIDIR 0.2  --   --    --   --   IBILI 0.3  --   --   --   --    < > = values in this interval not displayed.   PT/INR Recent Labs    07/13/20 0437 07/14/20 0342  LABPROT 15.1 16.4*  INR 1.2 1.4*   Hepatitis Panel Recent Labs    07/12/20 1700  HEPBSAG NON REACTIVE  HCVAB NON REACTIVE  HEPAIGM NON REACTIVE  HEPBIGM NON REACTIVE    Studies/Results: DG CHEST PORT 1 VIEW  Result Date: 07/13/2020 CLINICAL DATA:  Shortness of breath.  Weakness. EXAM: PORTABLE CHEST 1 VIEW COMPARISON:  07/01/2020 FINDINGS: Midline trachea. Mild cardiomegaly. Atherosclerosis in the transverse aorta. Mitral annular calcifications. Median sternotomy for CABG. No pleural fluid. Biapical pleuroparenchymal scarring. No congestive failure. Mild scarring or atelectasis at the lateral left lung base. IMPRESSION: Cardiomegaly, without congestive failure. Aortic Atherosclerosis (ICD10-I70.0). Electronically Signed   By: Abigail Miyamoto M.D.   On: 07/13/2020 10:41   DG Swallowing Func-Speech Pathology  Result Date: 07/13/2020 Objective Swallowing Evaluation: Type of Study: MBS-Modified Barium Swallow Study  Patient Details Name: Patricia Villegas MRN:  [id number] Date of Birth: 05/13/1935 Today's Date: 07/13/2020 Time: SLP Start Time (ACUTE ONLY): 1232 -SLP Stop Time (ACUTE ONLY): 1253 SLP Time Calculation (min) (ACUTE ONLY): 21 min Past Medical History: Past Medical History: Diagnosis Date . Anemia  . Anemia 06/14/2020 . Anxiety  . Arthritis  . Blood transfusion  . Chest pain   a. Lexiscan Myoview 12/12:  EF 75%, no ischemia or scar;  b.  Echo 12/12:  EF 60-65%, no sig valvular abnormalities  . Closed left fibular fracture   Casted  . Complication of anesthesia 1998  Unable to arouse fully, respiratory depression, pt. reports that the family was told that she would need a trach., but then she stabilized & didn't need it  . Coronary artery disease   sees Dr Aundra Dubin every 6 months . Depression  . Diabetes mellitus   Greater than 20 years type 2  . GERD  (gastroesophageal reflux disease)  . H/O hiatal hernia  . Hypercholesteremia  . Hypertension   Greater than 20 years . Lumbar disc disease   S/p surgery x4;  Sciatica  . Neuropathy due to secondary diabetes ([place])   Hx: of . Parastomal hernia of ileal conduit 2009 . Pneumonia   s/p CABG . [name] (peripheral vascular disease) ([facility])   Stable claudication. Arteriogram 07/2009 no significant aortoiliac disease, has bilateral SFA occlusions with distal reconstitution  . Rheumatoid arthritis ([place])   Dr Berna Bue @ [facility] . Transfusion reaction 1960  Convulsions (? seizure) . UTI (lower urinary tract infection) 01/03/15 Past Surgical History: Past Surgical History: Procedure Laterality Date . ABDOMINAL HYSTERECTOMY   . BACK SURGERY    4 back operations most recently 1995, 1998 . BIOPSY  06/17/2020  Procedure: BIOPSY;  Surgeon: Yetta Flock, MD;  Location: [facility] ENDOSCOPY;  Service: Gastroenterology;; . CARDIAC CATHETERIZATION    Hx: of 05/14/13 . CATARACT EXTRACTION W/ INTRAOCULAR LENS  IMPLANT, BILATERAL    Hx: of . COLONOSCOPY    Hx; of . COLONOSCOPY WITH PROPOFOL N/A 06/17/2020  Procedure: COLONOSCOPY WITH PROPOFOL;  Surgeon: Yetta Flock, MD;  Location: [facility] ENDOSCOPY;  Service: Gastroenterology;  Laterality: N/A; . CORONARY ARTERY BYPASS GRAFT N/A 05/24/2013  Procedure: CORONARY ARTERY BYPASS GRAFTING (CABG);  Surgeon: Melrose Nakayama, MD;  Location: [place];  Service: Open Heart Surgery;  Laterality: N/A; . DILATION AND CURETTAGE OF UTERUS   . ESOPHAGOGASTRODUODENOSCOPY (EGD) WITH PROPOFOL N/A 06/17/2020  Procedure: ESOPHAGOGASTRODUODENOSCOPY (EGD) WITH PROPOFOL;  Surgeon: Yetta Flock, MD;  Location: [place];  Service: Gastroenterology;  Laterality: N/A; . HEMOSTASIS CLIP PLACEMENT  06/17/2020  Procedure: HEMOSTASIS CLIP PLACEMENT;  Surgeon: Yetta Flock, MD;  Location: [place] ENDOSCOPY;  Service: Gastroenterology;; . HERNIA REPAIR  2009 . LEFT HEART CATHETERIZATION WITH CORONARY  ANGIOGRAM N/A 05/14/2013  Procedure: LEFT HEART CATHETERIZATION WITH CORONARY ANGIOGRAM;  Surgeon: Larey Dresser, MD;  Location: [facility] CATH LAB;  Service: Cardiovascular;  Laterality: N/A; . POLYPECTOMY  06/17/2020  Procedure: POLYPECTOMY;  Surgeon: Yetta Flock, MD;  Location: [facility] ENDOSCOPY;  Service: Gastroenterology;; . TONSILLECTOMY   . TOTAL HIP ARTHROPLASTY Left 01/10/2015  Procedure: TOTAL HIP ARTHROPLASTY ANTERIOR APPROACH;  Surgeon: Melrose Nakayama, MD;  Location: [place];  Service: Orthopedics;  Laterality: Left; . TOTAL HIP ARTHROPLASTY Right 05/30/2020  Procedure: RIGHT TOTAL HIP ARTHROPLASTY ANTERIOR APPROACH;  Surgeon: Melrose Nakayama, MD;  Location: [facility] ORS;  Service: Orthopedics;  Laterality: Right; HPI: Pt is an 85 y.o. female with medical history significant of RA, [name], DM, HTN, [name], and CAD s/p  CABG who presented with leg pain.  She had physical therapy sessions and became very weak came back to the hospital was found to have a bleeding ulcer and a DVT. Per neurology's note, pt has had an insidious slow progressive onset of weakness for 3-4 weeks concerning for myopathy. SLP was consulted since dysphagia may be related to myopathy.  No data recorded Assessment / Plan / Recommendation CHL IP CLINICAL IMPRESSIONS 07/13/2020 Clinical Impression Pt's positioning was suboptimal due to her inability to hold her head in an upright position d/t reported weakness. Without physical assistance from SLP and radiology tech, pt maintained a chin tuck posture. Pt exhibited difficulty with bolus propulsion, increased pharyngeal residue, and absent epiglottic inversion in this position, all increasing aspiration risk. Pt presented with oropharyngeal dysphagia characterized by reduced lingual retraction, reduced anterior laryngeal movement, reduced pharyngeal constriction, and aspiration (PAS 7) of nectar thick liquids secondary to spillover of pyriform sinus residue and delayed swallow initiation. Laryngeal sensation  was adequate but coughing was ineffective. She demonstrated moderate vallecular residue, mild posterior pharyngeal wall residue, and mild-moderate pyriform sinus residue. A barium tablet was attempted, but pt refused stating that she needs her pills to be crushed since she is fearful of "choking" on them. Pt stated that she needs her meats to be cut "smaller than a fingernail" for her to be able to masticate them. Pt's diet will be downgraded to dysphagia 2 solids and thin liquids. 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 With support to keep her head in an upright position, her swallow function was improved and risk reduced. The results of the study and the potential use a soft cervical collar to help support pt's neck, and therefore facilitate use of an upright head position, were discussed with Dr. Posey Pronto and he indicated that he will order it. SLP will follow pt. SLP Visit Diagnosis Dysphagia, unspecified (R13.10) Attention and concentration deficit following -- Frontal lobe and executive function deficit following -- Impact on safety and function Mild aspiration risk   CHL IP TREATMENT RECOMMENDATION 07/13/2020 Treatment Recommendations Therapy as outlined in treatment plan below   Prognosis 07/13/2020 Prognosis for Safe Diet Advancement Good Barriers to Reach Goals Severity of deficits Barriers/Prognosis Comment -- CHL IP DIET RECOMMENDATION 07/13/2020 SLP Diet Recommendations Dysphagia 2 (Fine chop) solids;Thin liquid Liquid Administration via Cup;Straw Medication Administration Crushed with puree Compensations Slow rate;Small sips/bites Postural Changes Seated upright at 90 degrees   CHL IP OTHER RECOMMENDATIONS 07/13/2020 Recommended Consults -- Oral Care Recommendations Oral care BID;Staff/trained caregiver to provide oral care Other Recommendations --   CHL IP FOLLOW UP RECOMMENDATIONS  07/13/2020 Follow up Recommendations (No Data)   CHL IP FREQUENCY AND DURATION 07/13/2020 Speech Therapy Frequency (ACUTE ONLY) min 2x/week Treatment Duration 2 weeks      CHL IP ORAL PHASE 07/13/2020 Oral Phase Impaired Oral - Pudding Teaspoon -- Oral - Pudding Cup -- Oral - Honey Teaspoon -- Oral - Honey Cup Reduced posterior propulsion Oral - Nectar Teaspoon -- Oral - Nectar Cup Reduced posterior propulsion Oral - Nectar Straw Reduced posterior propulsion Oral - Thin Teaspoon -- Oral - Thin Cup -- Oral - Thin Straw Reduced posterior propulsion Oral - Puree Reduced posterior propulsion Oral - Mech Soft Reduced posterior propulsion Oral - Regular Reduced posterior propulsion Oral - Multi-Consistency -- Oral - Pill -- Oral Phase - Comment --  CHL IP PHARYNGEAL PHASE  07/13/2020 Pharyngeal Phase Impaired Pharyngeal- Pudding Teaspoon -- Pharyngeal -- Pharyngeal- Pudding Cup -- Pharyngeal -- Pharyngeal- Honey Teaspoon -- Pharyngeal -- Pharyngeal- Honey Cup Pharyngeal residue - valleculae;Pharyngeal residue - pyriform;Pharyngeal residue - posterior pharnyx;Reduced anterior laryngeal mobility;Reduced epiglottic inversion Pharyngeal -- Pharyngeal- Nectar Teaspoon -- Pharyngeal -- Pharyngeal- Nectar Cup -- Pharyngeal -- Pharyngeal- Nectar Straw Pharyngeal residue - valleculae;Pharyngeal residue - pyriform;Pharyngeal residue - posterior pharnyx;Reduced anterior laryngeal mobility;Reduced epiglottic inversion;Moderate aspiration;Penetration/Aspiration during swallow;Penetration/Apiration after swallow Pharyngeal Material enters airway, passes BELOW cords and not ejected out despite cough attempt by patient Pharyngeal- Thin Teaspoon -- Pharyngeal -- Pharyngeal- Thin Cup -- Pharyngeal -- Pharyngeal- Thin Straw Pharyngeal residue - valleculae;Pharyngeal residue - pyriform;Pharyngeal residue - posterior pharnyx;Reduced anterior laryngeal mobility;Reduced epiglottic inversion Pharyngeal -- Pharyngeal- Puree Pharyngeal residue -  valleculae;Pharyngeal residue - pyriform;Pharyngeal residue - posterior pharnyx;Reduced anterior laryngeal mobility;Reduced epiglottic inversion Pharyngeal -- Pharyngeal- Mechanical Soft Pharyngeal residue - valleculae;Pharyngeal residue - pyriform;Pharyngeal residue - posterior pharnyx;Reduced anterior laryngeal mobility;Reduced epiglottic inversion Pharyngeal -- Pharyngeal- Regular Pharyngeal residue - valleculae;Pharyngeal residue - pyriform;Pharyngeal residue - posterior pharnyx;Reduced anterior laryngeal mobility;Reduced epiglottic inversion Pharyngeal -- Pharyngeal- Multi-consistency -- Pharyngeal -- Pharyngeal- Pill -- Pharyngeal -- Pharyngeal Comment --  Shanika I. Hardin Negus, [place], [place] Office number [phone] Pager [phone] No flowsheet data found. Horton Marshall 07/13/2020, 3:11 PM              US LIVER DOPPLER  Result Date: 07/13/2020 CLINICAL DATA:  Elevated LFTs EXAM: DUPLEX ULTRASOUND OF LIVER TECHNIQUE: Color and duplex Doppler ultrasound was performed to evaluate the hepatic in-flow and out-flow vessels. COMPARISON:  07/12/2020 FINDINGS: Liver: Normal parenchymal echogenicity. Normal hepatic contour without nodularity. No focal lesion, mass or intrahepatic biliary ductal dilatation. Main Portal Vein size: 1.0 cm Portal Vein Velocities Main Prox:  54 cm/sec Main Mid: 51 cm/sec Main Dist:  45 cm/sec Right: 34 cm/sec Left: 22 cm/sec Hepatic Vein Velocities Right:  22 cm/sec Middle:  32 cm/sec Left:  40 cm/sec IVC: Present and patent with normal respiratory phasicity. Hepatic Artery Velocity:  57 cm/sec Splenic Vein Velocity:  19 cm/sec Spleen: 2.6 cm x 6.8 cm x 7.4 cm with a total volume of 70 cm^3 (411 cm^3 is upper limit normal) Portal Vein Occlusion/Thrombus: No Splenic Vein Occlusion/Thrombus: No Ascites: None Varices: None Patent portal, hepatic and splenic veins with directional flow. Negative portal vein occlusion or thrombus. No ascites. Small left  effusion noted. IMPRESSION: Normal hepatic venous Doppler. Electronically Signed   By: Jerilynn Mages.  Shick M.D.   On: 07/13/2020 08:24   VAS [place] LOWER EXTREMITY VENOUS (DVT)  Result Date: 07/12/2020  Lower Venous DVT Study Indications: Follow up dvt.  Comparison Study: 07/04/20 previous Performing Technologist: Abram Sander RVS  Examination Guidelines: A complete evaluation includes B-mode imaging, spectral Doppler, color Doppler, and power Doppler as needed of all accessible portions of each vessel. Bilateral testing is considered an integral part of a complete examination. Limited examinations for reoccurring indications may be performed as noted. The reflux portion of the exam is performed with the patient in reverse Trendelenburg.  +---------+---------------+---------+-----------+----------+--------------+ RIGHT    CompressibilityPhasicitySpontaneityPropertiesThrombus Aging +---------+---------------+---------+-----------+----------+--------------+ CFV      Full           Yes      Yes                                 +---------+---------------+---------+-----------+----------+--------------+ SFJ      Full                                                        +---------+---------------+---------+-----------+----------+--------------+  FV Prox  Full                                                        +---------+---------------+---------+-----------+----------+--------------+ FV Mid   Full                                                        +---------+---------------+---------+-----------+----------+--------------+ FV DistalFull                                                        +---------+---------------+---------+-----------+----------+--------------+ PFV      Full                                                        +---------+---------------+---------+-----------+----------+--------------+ POP      Full           Yes      Yes                                  +---------+---------------+---------+-----------+----------+--------------+ PTV      Full                                                        +---------+---------------+---------+-----------+----------+--------------+ PERO     Full                                                        +---------+---------------+---------+-----------+----------+--------------+   +---------+---------------+---------+-----------+----------+-----------------+ LEFT     CompressibilityPhasicitySpontaneityPropertiesThrombus Aging    +---------+---------------+---------+-----------+----------+-----------------+ CFV      Full           Yes      Yes                                    +---------+---------------+---------+-----------+----------+-----------------+ SFJ      Full                                                           +---------+---------------+---------+-----------+----------+-----------------+ FV Prox  Full                                                           +---------+---------------+---------+-----------+----------+-----------------+  FV Mid   Full                                                           +---------+---------------+---------+-----------+----------+-----------------+ FV DistalFull                                                           +---------+---------------+---------+-----------+----------+-----------------+ PFV      Full                                                           +---------+---------------+---------+-----------+----------+-----------------+ POP      Full           Yes      Yes                                    +---------+---------------+---------+-----------+----------+-----------------+ PTV      None                                         Age Indeterminate +---------+---------------+---------+-----------+----------+-----------------+ PERO     None                                          Age Indeterminate +---------+---------------+---------+-----------+----------+-----------------+ Gastroc  None                                         Age Indeterminate +---------+---------------+---------+-----------+----------+-----------------+     Summary: RIGHT: - There is no evidence of deep vein thrombosis in the lower extremity.  - No cystic structure found in the popliteal fossa.  LEFT: - Findings consistent with age indeterminate deep vein thrombosis involving the left posterior tibial veins, left peroneal veins, and left gastrocnemius veins. - No cystic structure found in the popliteal fossa.  *See table(s) above for measurements and observations. Electronically signed by Curt Jews MD on 07/12/2020 at 3:05:07 PM.    Final    Scheduled Meds: . allopurinol  200 mg Oral Daily  . docusate sodium  100 mg Oral BID  . feeding supplement (GLUCERNA SHAKE)  237 mL Oral TID BM  . folic acid  2 mg Oral Daily  . furosemide  40 mg Intravenous BID  . gabapentin  100 mg Oral BID  . gabapentin  300 mg Oral QHS  . insulin aspart  0-9 Units Subcutaneous TID WC  . insulin glargine  10 Units Subcutaneous QPM  . liver oil-zinc oxide  1 application Topical TID  . melatonin  3 mg Oral QHS  . metoCLOPramide  5 mg Oral QHS  . metoprolol tartrate  12.5 mg  Oral BID  . pantoprazole (PROTONIX) IV  40 mg Intravenous Q12H  . phytonadione  5 mg Oral Daily   Continuous Infusions: . heparin 1,050 Units/hr (07/13/20 2237)  . lactated ringers 75 mL/hr at 07/13/20 1319  . methylPREDNISolone (SOLU-MEDROL) injection 1,000 mg (07/13/20 1145)   PRN Meds:.acetaminophen **OR** acetaminophen, albuterol, bisacodyl, hydrALAZINE, morphine injection, ondansetron **OR** ondansetron (ZOFRAN) IV, oxyCODONE, polyethylene glycol, senna-docusate, tiZANidine, traMADol   ASSESMENT:   *   Acute myositis w limb weakness, rhabdo,  dysphagia, elevated transaminases. Day 2/5 high dose Solumedrol.  Improved CK over last 24  hours.    *   Elevated transaminases, persist w some worsening.   Acute hepatitis panel non-reactive.     *   Coagulopathy  *   Macrocytic anemia.  Hgb fluctuating but overall stable.    *   Dysphagia in setting of myopathy.  SLP diet orders for D2 w thin liquids and larger  meds w applesauce/crushed.    *   06/17/20 EGD: non-bleeding DU and H Pylori gastritis.   Dark, FOBT + stools persisted after bid Protonix and 14 d abx for H pylori.    *   LLE DVT.  Was on Eliquis, note Heparin in place.    *   IDDM   PLAN   *   Switch to Protonx suspension 40 mg po bid.  Pills can not be crushed.      Azucena Freed  07/14/2020, 8:32 AM Phone [phone]    Attending physician's note   I have reviewed the chart and discussed with Patricia Villegas.  I did not see the patient today. I agree with the Advanced Practitioner's note, impression and recommendations.   Dysphagia likely d/t myopathy/myositis (being managed by neurology with steroids).  Expect to get better gradually. Neg EGD 06/17/2020 for eso stricture.  FOBT+ but stable Hb on heparin d/t DVT. EGD 06/17/2020-clean-based DU, HP gastritis s/p Rx. Neg Colon 06/17/2020 except for div, polyp s/p polypectomy. Neg CTAP with contrast.  Abn LFTs ?  Etiology (DILI vs shock liver vs rhabdo/myositis).  Liver Nl on CT AP. Neg [place] with doppler. She has been on MTX. 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 CK 25,849 today, myoglobin>30K, Cr Nl. WBC 32K  Associated advanced age, CKD, [name], RA, HTN, [name], DM, CAD, dCHF. DVT on heparin.  Plan: -Would continue supportive Rx for now. -Trend CBC, LFTs. Will order for the liver work-up in AM including ANA, AMA, ASMA, CMV/HSV, alpha-1 antitrypsin, ceruloplasmin. -Continue Protonix 40 BID  Can switch to suspension. -Scope only if there is active bleeding or significant drop in Hb.  -Avoid hepatotoxic medications.  Not a candidate for liver bx. -Appreciate  neurology's thorough work-up and input. -Will follow with you    Carmell Austria, MD [facility] [phone]

## 2020-07-14 NOTE — Progress Notes (Signed)
  Speech Language Pathology Treatment: Dysphagia  Patient Details Name: Patricia Villegas MRN: [id number] DOB: 12-06-1934 Today's Date: 07/14/2020 Time: 1212-1225 SLP Time Calculation (min) (ACUTE ONLY): 13 min  Assessment / Plan / Recommendation Clinical Impression  Pt was seen for dysphagia treatment with her daughter present. Patricia Lull, RN indicated that the pt "still gets choked a bit" with water and that her p.o. intake has been limited. Soft cervical collar was in place during the session and pt indicated that it has been helpful for swallowing and speech. Pt stated that the food has been adequately soft for mastication. Pt tolerated puree solids without overt s/sx of aspiration but throat clearing was inconsistently noted with thin liquids via straw. Pt denied sensation of pharyngeal residue. It is recommended that pt continue current diet and SLP will continue to follow pt.    HPI HPI: Pt is an 85 y.o. female with medical history significant of RA, [name], DM, HTN, [name], and CAD s/p CABG who presented with leg pain.  She had physical therapy sessions and became very weak came back to the hospital was found to have a bleeding ulcer and a DVT. Per neurology's note, pt has had an insidious slow progressive onset of weakness for 3-4 weeks concerning for myopathy. SLP was consulted since dysphagia may be related to myopathy.       SLP Plan  Continue with current plan of care       Recommendations  Diet recommendations: Dysphagia 2 (fine chop);Thin liquid Liquids provided via: Cup;Straw Medication Administration: Crushed with puree Supervision: Full supervision/cueing for compensatory strategies;Staff to assist with self feeding Compensations: Slow rate;Small sips/bites (Use cervical collar with p.o. intake) Postural Changes and/or Swallow Maneuvers: Seated upright 90 degrees                Oral Care Recommendations: Oral care BID;Staff/trained caregiver to provide oral care Follow up  Recommendations:  (TBD) SLP Visit Diagnosis: Dysphagia, oropharyngeal phase (R13.12) Plan: Continue with current plan of care       Maxen Rowland I. Hardin Negus, [place], [place] Office number [phone] Pager [place] 07/14/2020, 1:12 PM

## 2020-07-14 NOTE — Progress Notes (Addendum)
[facility] Progress Note  Patient: Patricia Villegas    KDX:[id number]  DOA: 07/25/2020     Date of Service: the patient was seen and examined on 07/14/2020  Brief hospital course: Past medical history of RA; [name]; DM; HTN; [name]; and CAD s/p CABG presenting with leg pain.  Found to have severe rhabdomyolysis along with elevated LFT.  Neurology was consulted.  GI was consulted for melena history. Currently plan is continue IV steroids.  Assessment and Plan: 1.  Nontraumatic rhabdomyolysis Presumed myositis Generalized weakness of both upper and lower extremity primarily proximal along with pain in lower extremity. CK continues to trend up despite IV LR.  Patient appears to be volume overloading. Patient was on Zocor currently on hold. Hepatic function panel also shows worsening LFT likely secondary to myositis. Neurology was consulted. Currently on high-dose steroids with 1 g IV Solu-Medrol. On chronic prednisone secondary to history of rheumatoid arthritis. Okay to start the patient back on methotrexate at home prior to surgery dose as well.  2.  Recent DVT Diagnosed in October 2021. Started on Eliquis. Currently reports melena. GI was consulted.  Currently no plan for intervention.  Tolerating heparin and monitor.  3.  Acute on chronic anemia with melena Acute on chronic blood loss anemia. Recently hospitalized for anemia underwent EGD found to have duodenal ulcer. Continuing twice daily PPI. Discontinue Eliquis for now. If continues to have drop in hemoglobin will require transfusion for hemoglobin less than 7.  4.  Urinary retention Monitor for now.  Resolved  5.   Acute on chronic diastolic CHF Essential hypertension CAD SP CABG Appears volume overloaded. IV albumin x1 on 07/13/2020. IV Lasix for now. Monitor response.  6.  Rheumatoid arthritis Holding Plaquenil. Neuro prefers to start methotrexate again.  Okay to start methotrexate.  7.   Dysphagia. Secondary to generalized weakness and fatigue but other than focal deficit. Underwent speech therapy evaluation with modified barium swallow. Speech recommends cervical collar to support the head to minimize aspiration and also dysphagia 2 diet as she has weakness with mastication. Ordered Glucerna.  Body mass index is 23.57 kg/m.   Diet: Cardiac diet DVT Prophylaxis: On therapeutic heparin Place and maintain sequential compression device Start: 07/14/20 1122   Advance goals of care discussion: DNR  Family Communication: family was present at bedside, at the time of interview.  The pt provided permission to discuss medical plan with the family. Opportunity was given to ask question and all questions were answered satisfactorily.   Disposition:  Status is: Inpatient  Remains inpatient appropriate because:IV treatments appropriate due to intensity of illness or inability to take PO   Dispo: The patient is from: Home              Anticipated d/c is to: SNF              Anticipated d/c date is: > 3 days              Patient currently is not medically stable to d/c.  Subjective: No nausea no vomiting. No fever no chills. Continues to have fatigue and tiredness. Swelling is improving.  Physical Exam:  General: Appear in mild distress, no Rash; Oral Mucosa Clear, moist. no Abnormal Neck Mass Or lumps, Conjunctiva normal  Cardiovascular: S1 and S2 Present, no Murmur, Respiratory: increased respiratory effort, Bilateral Air entry present and bilateral  Crackles, no wheezes Abdomen: Bowel Sound present, Soft and no tenderness Extremities: bilateral  Pedal edema Neurology: alert and oriented  to time, place, and person affect appropriate. no new focal deficit Gait not checked due to patient safety concerns  Vitals:   07/13/20 1925 07/14/20 0559 07/14/20 0726 07/14/20 1403  BP: (!) 152/63 121/62 122/63 119/60  Pulse: 81 69 72 62  Resp: 16 15 16 16   Temp: 98.2 F (36.8 C)  97.6 F (36.4 C) 97.8 F (36.6 C) 97.6 F (36.4 C)  TempSrc: Oral Oral Oral Oral  SpO2: 92% 100% 100% 100%  Weight:      Height:        Intake/Output Summary (Last 24 hours) at 07/14/2020 1819 Last data filed at 07/14/2020 1230 Gross per 24 hour  Intake 240 ml  Output 1350 ml  Net -1110 ml   Filed Weights   07/10/2020 0031  Weight: 66.2 kg    Data Reviewed: I have personally reviewed and interpreted daily labs, tele strips, imagings as discussed above. I reviewed all nursing notes, pharmacy notes, vitals, pertinent old records I have discussed plan of care as described above with RN and patient/family.  CBC: Recent Labs  Lab 07/12/20 0412 07/12/20 1700 07/13/20 0437 07/13/20 1651 07/14/20 0342  WBC 23.2* 37.5* 34.2* 28.9* 32.0*  HGB 8.5* 9.2* 8.2* 9.4* 8.3*  HCT 26.9* 28.2* 25.5* 28.7* 25.6*  MCV 103.5* 102.5* 103.7* 102.9* 105.8*  PLT 203 240 215 207 062   Basic Metabolic Panel: Recent Labs  Lab 07/20/2020 0111 07/30/2020 0459 07/12/20 0412 07/12/20 1700 07/13/20 0437 07/13/20 1651 07/14/20 0342  NA 141   < > 135 137 138 138 140  K 4.0   < > 4.3 4.1 4.2 4.4 4.4  CL 106   < > 103 102 107 106 107  CO2 26   < > 23 23 23 23  19*  GLUCOSE 106*   < > 138* 183* 171* 203* 189*  BUN 23   < > 20 24* 25* 27* 33*  CREATININE 0.65   < > 0.63 0.60 0.67 0.68 0.64  CALCIUM 8.4*   < > 8.0* 8.3* 8.0* 8.0* 8.0*  MG 2.2  --   --   --   --   --   --    < > = values in this interval not displayed.    Studies: No results found.  Scheduled Meds: . allopurinol  200 mg Oral Daily  . docusate  100 mg Oral BID  . feeding supplement (GLUCERNA SHAKE)  237 mL Oral TID BM  . folic acid  2 mg Oral Daily  . furosemide  40 mg Intravenous BID  . gabapentin  100 mg Oral BID  . gabapentin  300 mg Oral QHS  . insulin aspart  0-9 Units Subcutaneous TID WC  . insulin glargine  10 Units Subcutaneous QPM  . liver oil-zinc oxide  1 application Topical TID  . melatonin  3 mg Oral QHS  .  metoCLOPramide  5 mg Oral QHS  . metoprolol tartrate  12.5 mg Oral BID  . pantoprazole sodium  40 mg Oral BID   Continuous Infusions: . heparin 1,000 Units/hr (07/14/20 1043)  . methylPREDNISolone (SOLU-MEDROL) injection 1,000 mg (07/14/20 1326)   PRN Meds: acetaminophen **OR** acetaminophen, albuterol, bisacodyl, hydrALAZINE, morphine injection, ondansetron **OR** ondansetron (ZOFRAN) IV, oxyCODONE, phenol, polyethylene glycol, senna-docusate, tiZANidine, traMADol  Time spent: 35 minutes  Author: Berle Mull, MD [facility] 07/14/2020 6:19 PM  To reach On-call, see care teams to locate the attending and reach out via [email or web address]. Between 7PM-7AM, please contact night-coverage If you still have  difficulty reaching the attending provider, please page the [facility] (Director on Call) for [facility] on amion for assistance.

## 2020-07-14 NOTE — Significant Event (Signed)
Not a Rapid Response Event- 2nd assessment  Reason for Call : SOB  Initial Focused Assessment:  Shanon Brow RN notified me of Patricia Villegas c/o SOB and some increased WOB requiring oxygen.  Dr. Posey Pronto recently visited pt about 6pm and noted some increased respiratory effort and bibasilar crackles. Pt is on Lasix IV BID and received a dose of 40mg  at 1820. Patricia Villegas is alert, oriented x4.  She does endorse some SOB and a productive cough with tan mucous, however she is not in distress. Skin is warm, pink and dry.  Generalized pitting edema. BBS bibasilar crackles. RR 18-20 with sats 94% on 2L [place]. Pt was on RA earlier today.      Interventions:  No acute interventions by RRRN  Plan of Care: -Monitor WOB and signs of respirtatory distress -Document full set of VS -Monitor urine output to ensure response to Lasix -Notify RRRN for any further assistance    Call Time: 2026 Arrival Time: 2032 End Time: 2056  Madelynn Done, RN

## 2020-07-14 NOTE — Plan of Care (Signed)
Patient on heparin drip at 10.5 cc/hr. PTT recheck due to lab being drawn in right upper extremity where infusion is going. Pain controlled with PRN meds as ordered and CK is trending down - at 25,849 this morning. No apparent distress or needs voiced this morning. Will continue to monitor and continue current POC.

## 2020-07-14 NOTE — Progress Notes (Signed)
[place] for Heparin Indication: Conversion from apixaban to heparin for recent DVT in setting of impending procedure.   No Known Allergies  Patient Measurements: Height: 5\' 6"  (167.6 cm) Weight: 66.2 kg (146 lb) IBW/kg (Calculated) : 59.3 Heparin Dosing Weight: 66.2  Vital Signs: Temp: 97.6 F (36.4 C) (11/05 1403) Temp Source: Oral (11/05 1403) BP: 119/60 (11/05 1403) Pulse Rate: 62 (11/05 1403)  Labs: Recent Labs    07/12/20 0412 07/12/20 0412 07/12/20 1700 07/13/20 0437 07/13/20 0437 07/13/20 1651 07/13/20 2058 07/13/20 2058 07/14/20 0342 07/14/20 0633 07/14/20 1830  HGB 8.5*  --    < > 8.2*   < > 9.4*  --   --  8.3*  --   --   HCT 26.9*  --    < > 25.5*  --  28.7*  --   --  25.6*  --   --   PLT 203  --    < > 215  --  207  --   --  218  --   --   APTT 29   < >  --   --   --   --  50*   < > 153* 145* >200*  LABPROT 18.6*  --   --  15.1  --   --   --   --  16.4*  --   --   INR 1.6*  --   --  1.2  --   --   --   --  1.4*  --   --   HEPARINUNFRC  --   --   --   --   --   --   --   --  2.08*  --   --   CREATININE 0.63  --    < > 0.67  --  0.68  --   --  0.64  --   --   CKTOTAL 23,406*  --    < > 27,326*  --   --  79,892*  --  25,849*  --   --    < > = values in this interval not displayed.    Estimated Creatinine Clearance: 49.9 mL/min (by C-G formula based on SCr of 0.64 mg/dL).  [name]; [name]; DM; HTN; [name]; and CAD s/p CABG presenting with leg pain. Patient with possible myositis with possible procedure due. Started on apixaban recently within last few weeks for acute left leg DVT. Patient received last dose of apixaban at [place] on 11/3.  Heparin previously stopped due to melana in the stool. APTT came back supratherapeutic >200, on 1000 units/hr. Drawn from opposite arm. Hgb 8.3, plt 218. No s/sx of bleeding or infusion issues.   Goal of Therapy:  Heparin  level 0.3-0.7 APTT 66-102 sec Monitor platelets by anticoagulation protocol: Yes   Plan:  Hold heparin infusion for 1 hr Reduce heparin infusion to 800 units/hr upon restart  APTT in 8 hours Daily hep lvl, cbc, aptt until correlate  Antonietta Jewel, PharmD, [place] Clinical Pharmacist  Phone: [phone] 07/14/2020 8:22 PM  Please check AMION for all [place] phone numbers After 10:00 PM, call [place] [phone]

## 2020-07-14 NOTE — Progress Notes (Signed)
NIF: -24 1st effort  NIF: -26 2nd effort.   The best of three good patient efforts was NIF -26. Patient is alert and oriented no signs of respiratory distress noted at this time. BBS Crackles noted

## 2020-07-14 NOTE — Care Management Important Message (Signed)
Important Message  Patient Details  Name: Patricia Villegas MRN: [id number] Date of Birth: Feb 04, 1935   Medicare Important Message Given:  Yes     Gray Maugeri P Arvie Villarruel 07/14/2020, 1:54 PM

## 2020-07-15 ENCOUNTER — Inpatient Hospital Stay: Payer: Self-pay

## 2020-07-15 ENCOUNTER — Inpatient Hospital Stay (HOSPITAL_COMMUNITY): Payer: Medicare Other

## 2020-07-15 DIAGNOSIS — M6282 Rhabdomyolysis: Secondary | ICD-10-CM | POA: Diagnosis not present

## 2020-07-15 DIAGNOSIS — R7989 Other specified abnormal findings of blood chemistry: Secondary | ICD-10-CM | POA: Diagnosis not present

## 2020-07-15 LAB — COMPREHENSIVE METABOLIC PANEL
ALT: 557 U/L — ABNORMAL HIGH (ref 0–44)
AST: 423 U/L — ABNORMAL HIGH (ref 15–41)
Albumin: 1.9 g/dL — ABNORMAL LOW (ref 3.5–5.0)
Alkaline Phosphatase: 56 U/L (ref 38–126)
Anion gap: 11 (ref 5–15)
BUN: 44 mg/dL — ABNORMAL HIGH (ref 8–23)
CO2: 22 mmol/L (ref 22–32)
Calcium: 8 mg/dL — ABNORMAL LOW (ref 8.9–10.3)
Chloride: 109 mmol/L (ref 98–111)
Creatinine, Ser: 0.62 mg/dL (ref 0.44–1.00)
GFR, Estimated: >60 mL/min (ref >60)
Glucose, Bld: 200 mg/dL — ABNORMAL HIGH (ref 70–99)
Potassium: 4.5 mmol/L (ref 3.5–5.1)
Sodium: 142 mmol/L (ref 135–145)
Total Bilirubin: 0.6 mg/dL (ref 0.3–1.2)
Total Protein: 4.5 g/dL — ABNORMAL LOW (ref 6.5–8.1)

## 2020-07-15 LAB — CBC
HCT: 28.6 % — ABNORMAL LOW (ref 36.0–46.0)
Hemoglobin: 9.5 g/dL — ABNORMAL LOW (ref 12.0–15.0)
MCH: 34.9 pg — ABNORMAL HIGH (ref 26.0–34.0)
MCHC: 33.2 g/dL (ref 30.0–36.0)
MCV: 105.1 fL — ABNORMAL HIGH (ref 80.0–100.0)
Platelets: 206 10*3/uL (ref 150–400)
RBC: 2.72 MIL/uL — ABNORMAL LOW (ref 3.87–5.11)
RDW: 19.6 % — ABNORMAL HIGH (ref 11.5–15.5)
WBC: 32.2 10*3/uL — ABNORMAL HIGH (ref 4.0–10.5)
nRBC: 0.4 % — ABNORMAL HIGH (ref 0.0–0.2)

## 2020-07-15 LAB — CK: Total CK: 13991 U/L — ABNORMAL HIGH (ref 38–234)

## 2020-07-15 LAB — GLUCOSE, CAPILLARY
Glucose-Capillary: 167 mg/dL — ABNORMAL HIGH (ref 70–99)
Glucose-Capillary: 70 mg/dL (ref 70–99)

## 2020-07-15 LAB — APTT
aPTT: 141 seconds — ABNORMAL HIGH (ref 24–36)
aPTT: 72 seconds — ABNORMAL HIGH (ref 24–36)

## 2020-07-15 LAB — PROTIME-INR
INR: 1.3 — ABNORMAL HIGH (ref 0.8–1.2)
Prothrombin Time: 15.5 seconds — ABNORMAL HIGH (ref 11.4–15.2)

## 2020-07-15 LAB — HEPARIN LEVEL (UNFRACTIONATED)
Heparin Unfractionated: 1.88 IU/mL — ABNORMAL HIGH (ref 0.30–0.70)
Heparin Unfractionated: 1.16 IU/mL — ABNORMAL HIGH (ref 0.30–0.70)

## 2020-07-15 LAB — TROPONIN I (HIGH SENSITIVITY)
Troponin I (High Sensitivity): 72 ng/L — ABNORMAL HIGH (ref <18)
Troponin I (High Sensitivity): 74 ng/L — ABNORMAL HIGH (ref <18)

## 2020-07-15 LAB — MYOGLOBIN, SERUM: Myoglobin: >30000 ng/mL — ABNORMAL HIGH (ref 25–58)

## 2020-07-15 MED ORDER — APIXABAN 5 MG PO TABS: 5.0000 mg | ORAL_TABLET | Freq: Two times a day (BID) | ORAL | Status: DC

## 2020-07-15 MED ORDER — SODIUM CHLORIDE 0.9% FLUSH: 10.0000 mL | INTRAVENOUS | Status: DC | PRN

## 2020-07-15 MED ORDER — FUROSEMIDE 10 MG/ML IJ SOLN
40.0000 mg | Freq: Four times a day (QID) | INTRAMUSCULAR | Status: DC
  Administered 2020-07-15 – 2020-07-16 (×3): 40 mg via INTRAVENOUS
  Filled 2020-07-15 (×3): qty 4

## 2020-07-15 MED ORDER — ALBUMIN HUMAN 25 % IV SOLN
12.5000 g | Freq: Four times a day (QID) | INTRAVENOUS | Status: AC
  Administered 2020-07-15 – 2020-07-16 (×3): 12.5 g via INTRAVENOUS
  Filled 2020-07-15 (×4): qty 50

## 2020-07-15 MED ORDER — CHLORHEXIDINE GLUCONATE CLOTH 2 % EX PADS
6.0000 | MEDICATED_PAD | Freq: Every day | CUTANEOUS | Status: DC
  Administered 2020-07-17 – 2020-07-20 (×4): 6 via TOPICAL

## 2020-07-15 MED ORDER — SODIUM CHLORIDE 0.9% FLUSH
10.0000 mL | Freq: Two times a day (BID) | INTRAVENOUS | Status: DC
  Administered 2020-07-15 – 2020-07-20 (×8): 10 mL

## 2020-07-15 MED ORDER — MAGIC MOUTHWASH
5.0000 mL | Freq: Four times a day (QID) | ORAL | Status: DC
  Administered 2020-07-17 – 2020-07-24 (×13): 5 mL via ORAL
  Filled 2020-07-15 (×45): qty 5

## 2020-07-15 MED ORDER — FUROSEMIDE 10 MG/ML IJ SOLN
40.0000 mg | Freq: Once | INTRAMUSCULAR | Status: AC
  Administered 2020-07-15: 40 mg via INTRAVENOUS

## 2020-07-15 MED ORDER — HEPARIN (PORCINE) 25000 UT/250ML-% IV SOLN
650.0000 [IU]/h | INTRAVENOUS | Status: DC
  Administered 2020-07-15: 600 [IU]/h via INTRAVENOUS
  Administered 2020-07-16: 650 [IU]/h via INTRAVENOUS
  Filled 2020-07-15: qty 250

## 2020-07-15 NOTE — Significant Event (Signed)
Rapid Response Event Note   Reason for Call :  Respiratory distress  Initial Focused Assessment:  Called because the patient was having increased WOB and was also SOB.  Patient is currently on 6L nasal cannula.  Patient has good color and did not appear to be in active distress.  Patient is AOx4.   BP 119/56 ECG 67 RR 20 O2 94 (8L)      Interventions:  PRN albuterol given, vitals taken, patient repositioned   Plan of Care:  Patient will be transferred to a higher level of care on [place]   Event Summary:   MD Notified: Posey Pronto MD (bedside) Call Time: [place] Time: 1050 End Time: [place], RN

## 2020-07-15 NOTE — Progress Notes (Signed)
[place] for Heparin Indication: Conversion from apixaban to heparin for recent DVT in setting of impending procedure.   No Known Allergies  Patient Measurements: Height: 5\' 6"  (167.6 cm) Weight: 66.2 kg (146 lb) IBW/kg (Calculated) : 59.3 Heparin Dosing Weight: 66.2  Vital Signs: Temp: 97.5 F (36.4 C) (11/06 0543) Temp Source: Oral (11/06 0543) BP: 123/58 (11/06 0543) Pulse Rate: 58 (11/06 0543)  Labs: Recent Labs    07/13/20 0437 07/13/20 0437 07/13/20 1651 07/13/20 2058 07/13/20 2058 07/14/20 0342 07/14/20 0342 07/14/20 0633 07/14/20 1830 07/15/20 0529  HGB 8.2*   < > 9.4*  --   --  8.3*  --   --   --   --   HCT 25.5*  --  28.7*  --   --  25.6*  --   --   --   --   PLT 215  --  207  --   --  218  --   --   --   --   APTT  --   --   --  50*   < > 153*   < > 145* >200* 141*  LABPROT 15.1  --   --   --   --  16.4*  --   --   --  15.5*  INR 1.2  --   --   --   --  1.4*  --   --   --  1.3*  HEPARINUNFRC  --   --   --   --   --  2.08*  --   --   --  1.88*  CREATININE 0.67   < > 0.68  --   --  0.64  --   --   --  0.62  CKTOTAL 27,326*  --   --  10,258*  --  25,849*  --   --   --   --    < > = values in this interval not displayed.    Estimated Creatinine Clearance: 49.9 mL/min (by C-G formula based on SCr of 0.62 mg/dL).  [name]; [name]; DM; HTN; [name]; and CAD s/p CABG presenting with leg pain. Patient with possible myositis with possible procedure due. Started on apixaban recently within last few weeks for acute left leg DVT. Patient received last dose of apixaban at [place] on 11/3.  Heparin previously stopped due to melana in the stool. APTT came back supratherapeutic >200, on 1000 units/hr. Drawn from opposite arm. Hgb 8.3, plt 218. No s/sx of bleeding or infusion issues.   11/6 AM update:  APTT 141  Goal of Therapy:  Heparin level 0.3-0.7 APTT 66-102  sec Monitor platelets by anticoagulation protocol: Yes   Plan:  Dec heparin to 600 units/hr 1500 aPTT/HL  Narda Bonds, PharmD, BCPS Clinical Pharmacist Phone: [phone]

## 2020-07-15 NOTE — Progress Notes (Signed)
ANTICOAGULATION CONSULT NOTE - Initial Consult  Pharmacy Consult for Heparin Indication: Recent  DVT  No Known Allergies  Patient Measurements: Height: 5\' 6"  (167.6 cm) Weight: 66.2 kg (146 lb) IBW/kg (Calculated) : 59.3 Heparin Dosing Weight: 66.2 kg  Vital Signs: Temp: 97.6 F (36.4 C) (11/06 0826) Temp Source: Oral (11/06 0826) BP: 131/60 (11/06 0826) Pulse Rate: 64 (11/06 0826)  Labs: Recent Labs    07/13/20 0437 07/13/20 0437 07/13/20 1651 07/13/20 1651 07/13/20 2058 07/14/20 0342 07/14/20 0342 07/14/20 0633 07/14/20 1830 07/15/20 0529  HGB 8.2*   < > 9.4*   < >  --  8.3*  --   --   --  9.5*  HCT 25.5*   < > 28.7*  --   --  25.6*  --   --   --  28.6*  PLT 215   < > 207  --   --  218  --   --   --  206  APTT  --   --   --    < > 50* 153*   < > 145* >200* 141*  LABPROT 15.1  --   --   --   --  16.4*  --   --   --  15.5*  INR 1.2  --   --   --   --  1.4*  --   --   --  1.3*  HEPARINUNFRC  --   --   --   --   --  2.08*  --   --   --  1.88*  CREATININE 0.67   < > 0.68  --   --  0.64  --   --   --  0.62  CKTOTAL 27,326*  --   --   --  15,176* 16,073*  --   --   --   --    < > = values in this interval not displayed.    Estimated Creatinine Clearance: 49.9 mL/min (by C-G formula based on SCr of 0.62 mg/dL).   Medical History: Past Medical History:  Diagnosis Date  . Anemia   . Anemia 06/14/2020  . Anxiety   . Arthritis   . Blood transfusion   . Chest pain    a. Lexiscan Myoview 12/12:  EF 75%, no ischemia or scar;  b.  Echo 12/12:  EF 60-65%, no sig valvular abnormalities   . Closed left fibular fracture    Casted   . Complication of anesthesia 1998   Unable to arouse fully, respiratory depression, pt. reports that the family was told that she would need a trach., but then she stabilized & didn't need it   . Coronary artery disease    sees Dr Aundra Dubin every 6 months  . Depression   . Diabetes mellitus    Greater than 20 years type 2   . GERD  (gastroesophageal reflux disease)   . H/O hiatal hernia   . Hypercholesteremia   . Hypertension    Greater than 20 years  . Lumbar disc disease    S/p surgery x4;  Sciatica   . Neuropathy due to secondary diabetes ([place])    Hx: of  . Parastomal hernia of ileal conduit 2009  . Pneumonia    s/p CABG  . [name] (peripheral vascular disease) (HCC)    Stable claudication. Arteriogram 07/2009 no significant aortoiliac disease, has bilateral SFA occlusions with distal reconstitution   . Rheumatoid arthritis ([place])    Dr Berna Bue @  [facility]  . Transfusion reaction 1960   Convulsions (? seizure)  . UTI (lower urinary tract infection) 01/03/15    Medications:  Scheduled:  . furosemide  40 mg Intravenous Q6H  . gabapentin  100 mg Oral BID  . gabapentin  300 mg Oral QHS  . insulin aspart  0-9 Units Subcutaneous TID WC  . insulin glargine  10 Units Subcutaneous QPM  . liver oil-zinc oxide  1 application Topical TID  . magic mouthwash  5 mL Oral QID  . metoprolol tartrate  12.5 mg Oral BID  . pantoprazole sodium  40 mg Oral BID    Assessment: 85 y.o.femalewith medical history significant ofRA; [name]; DM; HTN; [name]; and CAD s/p CABG presenting with leg pain. Patient with possible myositis. Started on apixaban recently within last few weeks for acute left leg DVT. Patient started on heparin due to possible biopsy/procedure. Was held on 11/4 due to concerns for melena and resumed 11/5. Confirmed no infusion issues or further concerns for bleeding with nursing.   Pharmacy initially consulted to resume apixaban today given melena resolution and no future procedures planned; however, patient now NPO given concern for aspiration at this time. Heparin infusion off for ~1h. Will resume @ 600 units/hr. No apixaban given this AM. Follow-up 6-hour aPTT/HL.  Goal of Therapy:  Monitor platelets by anticoagulation protocol: Yes   Plan:  Restart heparin @ 600 u/hr Check 6h aPTT/HL Daily  aPTT/HL  Alfonse Spruce, PharmD PGY2 ID Pharmacy Resident Phone between 7 am - 3:30 pm: [phone]  Please check AMION for all [place] phone numbers After 10:00 PM, call [place] [phone]  07/15/2020,10:53 AM

## 2020-07-15 NOTE — Progress Notes (Signed)
ANTICOAGULATION CONSULT NOTE - Initial Consult  Pharmacy Consult for Apixaban Indication: DVT  No Known Allergies  Patient Measurements: Height: 5\' 6"  (167.6 cm) Weight: 66.2 kg (146 lb) IBW/kg (Calculated) : 59.3 Heparin Dosing Weight: 66.2 kg  Vital Signs: Temp: 97.6 F (36.4 C) (11/06 0826) Temp Source: Oral (11/06 0826) BP: 131/60 (11/06 0826) Pulse Rate: 64 (11/06 0826)  Labs: Recent Labs    07/13/20 0437 07/13/20 0437 07/13/20 1651 07/13/20 1651 07/13/20 2058 07/14/20 0342 07/14/20 0342 07/14/20 0633 07/14/20 1830 07/15/20 0529  HGB 8.2*   < > 9.4*   < >  --  8.3*  --   --   --  9.5*  HCT 25.5*   < > 28.7*  --   --  25.6*  --   --   --  28.6*  PLT 215   < > 207  --   --  218  --   --   --  206  APTT  --   --   --    < > 50* 153*   < > 145* >200* 141*  LABPROT 15.1  --   --   --   --  16.4*  --   --   --  15.5*  INR 1.2  --   --   --   --  1.4*  --   --   --  1.3*  HEPARINUNFRC  --   --   --   --   --  2.08*  --   --   --  1.88*  CREATININE 0.67   < > 0.68  --   --  0.64  --   --   --  0.62  CKTOTAL 27,326*  --   --   --  42,353* 61,443*  --   --   --   --    < > = values in this interval not displayed.    Estimated Creatinine Clearance: 49.9 mL/min (by C-G formula based on SCr of 0.62 mg/dL).   Medical History: Past Medical History:  Diagnosis Date  . Anemia   . Anemia 06/14/2020  . Anxiety   . Arthritis   . Blood transfusion   . Chest pain    a. Lexiscan Myoview 12/12:  EF 75%, no ischemia or scar;  b.  Echo 12/12:  EF 60-65%, no sig valvular abnormalities   . Closed left fibular fracture    Casted   . Complication of anesthesia 1998   Unable to arouse fully, respiratory depression, pt. reports that the family was told that she would need a trach., but then she stabilized & didn't need it   . Coronary artery disease    sees Dr Aundra Dubin every 6 months  . Depression   . Diabetes mellitus    Greater than 20 years type 2   . GERD (gastroesophageal  reflux disease)   . H/O hiatal hernia   . Hypercholesteremia   . Hypertension    Greater than 20 years  . Lumbar disc disease    S/p surgery x4;  Sciatica   . Neuropathy due to secondary diabetes ([place])    Hx: of  . Parastomal hernia of ileal conduit 2009  . Pneumonia    s/p CABG  . [name] (peripheral vascular disease) (HCC)    Stable claudication. Arteriogram 07/2009 no significant aortoiliac disease, has bilateral SFA occlusions with distal reconstitution   . Rheumatoid arthritis ([place])    Dr Berna Bue @ [facility]  . Transfusion reaction 1960   Convulsions (? seizure)  . UTI (lower urinary tract infection) 01/03/15    Medications:  Scheduled:  . allopurinol  200 mg Oral Daily  . apixaban  5 mg Oral BID  . docusate  100 mg Oral BID  . feeding supplement (GLUCERNA SHAKE)  237 mL Oral TID BM  . folic acid  2 mg Oral Daily  . furosemide  40 mg Intravenous BID  . gabapentin  100 mg Oral BID  . gabapentin  300 mg Oral QHS  . insulin aspart  0-9 Units Subcutaneous TID WC  . insulin glargine  10 Units Subcutaneous QPM  . liver oil-zinc oxide  1 application Topical TID  . melatonin  3 mg Oral QHS  . metoCLOPramide  5 mg Oral QHS  . metoprolol tartrate  12.5 mg Oral BID  . pantoprazole sodium  40 mg Oral BID    Assessment: 85 y.o.femalewith medical history significant ofRA; [name]; DM; HTN; [name]; and CAD s/p CABG presenting with leg pain. Patient with possible myositis. Started on apixaban recently within last few weeks for acute left leg DVT. Patient started on heparin due to possible biopsy/procedure. Was held on 11/4 due to concerns for melena and resumed 11/5. Confirmed no infusion issues or further concerns for bleeding with nursing. Pharmacy consulted to resume apixaban given melena resolution and no future procedures planned. Stopped heparin with plan to restart PTA apixaban 5mg  BID.  Goal of Therapy:  Monitor platelets by anticoagulation protocol: Yes   Plan:   Discontinue heparin Restart apixaban 5mg  BID  Alfonse Spruce, PharmD PGY2 ID Pharmacy Resident Phone between 7 am - 3:30 pm: [phone]  Please check AMION for all [place] phone numbers After 10:00 PM, call [place] [phone]  07/15/2020,9:29 AM

## 2020-07-15 NOTE — Progress Notes (Signed)
Patient alert and oriented x3,  c/o SOB on oxygen at 2lpm, called rapid response. SAT 86-98%.  MD in to see patient with new orders.  lasix IV given, cardiac monitor initiated.  STAT EKG called.  Report given to RN on [place] and patient transferred to room 14.

## 2020-07-15 NOTE — Progress Notes (Signed)
Progress Note    ASSESSMENT AND PLAN:   ?Aspiration pneumonia. CxR pending  Oropharyngeal dysphagia d/t myopathy/myositis (being managed by neurology with steroids).  Expect to get better gradually. Neg EGD 06/17/2020 for eso stricture.  FOBT+. No active bleeding. Hb has improved on steroids.  EGD 06/17/2020-clean-based DU, HP gastritis s/p Rx. Neg Colon 06/17/2020 except for div, polyp s/p polypectomy. Neg CTAP with contrast.  Abn LFTs s/o hepatocellular pattern. ?Etiology (likely related d/t perihepatitis. D/d DILI, shock liver) AST improving on steroids.Liver Nl onCT AP. Neg [place] with doppler. No intrinsic liver disease. Shehas been on MTX. 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 CK 25K, myoglobin>30K, Cr Nl. WBC 32K. Followed closely by neurology.  Associated advanced age, CKD,[name], RA, HTN, [name], DM, CAD, dCHF. DVT on heparin.  Plan: -Would continue supportive Rx for now. -Trend CBC,LFTs. Will order liver work-up. -Continue Protonix suspension 40 BID. -Scope only if there is active bleeding or significant drop inHb.  -Not a candidate for liver bx. -If MTX is absolutely needed for autoimmune myositis, I believe it will be okay to start from liver standpoint since there is no intrinsic liver disease. -Aspiration precautions. Feeds as per speech consult. -Appreciate neurology's thorough work-up and excellent care.  D/W Dr Curly [place]  -Will follow with you on Monday. Dr. Tarri Glenn taking over the service. -Overall guarded prognosis.   SUBJECTIVE   No active bleeding. Hb has improved on steroids Still has dysphagia She might of aspirated as evident from coughing, somewhat labored breathing Chest x-ray has been ordered by neurology and is pending    OBJECTIVE:     Vital signs in last 24 hours: Temp:  [97.5 F (36.4 C)-97.6 F (36.4 C)] 97.6 F (36.4 C) (11/06 0826) Pulse Rate:  [58-68] 64 (11/06 0826) Resp:   [15-16] 16 (11/06 0826) BP: (119-131)/(53-60) 131/60 (11/06 0826) SpO2:  [98 %-100 %] 100 % (11/06 0826) Last BM Date: 07/13/20 General:   Alert, well-developed female in NAD, breathing labored somewhat Psych:  Pleasant, cooperative.  Normal mood and affect. Did not examine today   Intake/Output from previous day: 11/05 0701 - 11/06 0700 In: 537.7 [P.O.:480; I.V.:57.7] Out: 1000 [Urine:1000] Intake/Output this shift: No intake/output data recorded.  Lab Results: Recent Labs    07/13/20 1651 07/14/20 0342 07/15/20 0529  WBC 28.9* 32.0* 32.2*  HGB 9.4* 8.3* 9.5*  HCT 28.7* 25.6* 28.6*  PLT 207 218 206   BMET Recent Labs    07/13/20 1651 07/14/20 0342 07/15/20 0529  NA 138 140 142  K 4.4 4.4 4.5  CL 106 107 109  CO2 23 19* 22  GLUCOSE 203* 189* 200*  BUN 27* 33* 44*  CREATININE 0.68 0.64 0.62  CALCIUM 8.0* 8.0* 8.0*   LFT Recent Labs    07/15/20 0529  PROT 4.5*  ALBUMIN 1.9*  AST 423*  ALT 557*  ALKPHOS 56  BILITOT 0.6   PT/INR Recent Labs    07/14/20 0342 07/15/20 0529  LABPROT 16.4* 15.5*  INR 1.4* 1.3*   Hepatitis Panel Recent Labs    07/12/20 1700  HEPBSAG NON REACTIVE  HCVAB NON REACTIVE  HEPAIGM NON REACTIVE  HEPBIGM NON REACTIVE    DG Swallowing Func-Speech Pathology  Result Date: 07/13/2020 Objective Swallowing Evaluation: Type of Study: MBS-Modified Barium Swallow Study  Patient Details Name: Patricia Villegas MRN: [id number] Date of Birth: [birth year] Today's Date: 07/13/2020 Time: SLP Start Time (ACUTE ONLY): 1232 -SLP Stop Time (ACUTE ONLY): 3825  SLP Time Calculation (min) (ACUTE ONLY): 21 min Past Medical History: Past Medical History: Diagnosis Date . Anemia  . Anemia 06/14/2020 . Anxiety  . Arthritis  . Blood transfusion  . Chest pain   a. Lexiscan Myoview 12/12:  EF 75%, no ischemia or scar;  b.  Echo 12/12:  EF 60-65%, no sig valvular abnormalities  . Closed left fibular fracture   Casted  . Complication of anesthesia 1998  Unable to  arouse fully, respiratory depression, pt. reports that the family was told that she would need a trach., but then she stabilized & didn't need it  . Coronary artery disease   sees Dr Aundra Dubin every 6 months . Depression  . Diabetes mellitus   Greater than 20 years type 2  . GERD (gastroesophageal reflux disease)  . H/O hiatal hernia  . Hypercholesteremia  . Hypertension   Greater than 20 years . Lumbar disc disease   S/p surgery x4;  Sciatica  . Neuropathy due to secondary diabetes ([place])   Hx: of . Parastomal hernia of ileal conduit 2009 . Pneumonia   s/p CABG . [name] (peripheral vascular disease) (H[facility])   Stable claudication. Arteriogram 07/2009 no significant aortoiliac disease, has bilateral SFA occlusions with distal reconstitution  . Rheumatoid arthritis ([place])   Dr Berna Bue @ [facility] . Transfusion reaction 1960  Convulsions (? seizure) . UTI (lower urinary tract infection) 01/03/15 Past Surgical History: Past Surgical History: Procedure Laterality Date . ABDOMINAL HYSTERECTOMY   . BACK SURGERY    4 back operations most recently 1995, 1998 . BIOPSY  06/17/2020  Procedure: BIOPSY;  Surgeon: Yetta Flock, MD;  Location: [facility] ENDOSCOPY;  Service: Gastroenterology;; . CARDIAC CATHETERIZATION    Hx: of 05/14/13 . CATARACT EXTRACTION W/ INTRAOCULAR LENS  IMPLANT, BILATERAL    Hx: of . COLONOSCOPY    Hx; of . COLONOSCOPY WITH PROPOFOL N/A 06/17/2020  Procedure: COLONOSCOPY WITH PROPOFOL;  Surgeon: Yetta Flock, MD;  Location: [facility] ENDOSCOPY;  Service: Gastroenterology;  Laterality: N/A; . CORONARY ARTERY BYPASS GRAFT N/A 05/24/2013  Procedure: CORONARY ARTERY BYPASS GRAFTING (CABG);  Surgeon: Melrose Nakayama, MD;  Location: [place];  Service: Open Heart Surgery;  Laterality: N/A; . DILATION AND CURETTAGE OF UTERUS   . ESOPHAGOGASTRODUODENOSCOPY (EGD) WITH PROPOFOL N/A 06/17/2020  Procedure: ESOPHAGOGASTRODUODENOSCOPY (EGD) WITH PROPOFOL;  Surgeon: Yetta Flock, MD;  Location: [place];   Service: Gastroenterology;  Laterality: N/A; . HEMOSTASIS CLIP PLACEMENT  06/17/2020  Procedure: HEMOSTASIS CLIP PLACEMENT;  Surgeon: Yetta Flock, MD;  Location: [place] ENDOSCOPY;  Service: Gastroenterology;; . HERNIA REPAIR  2009 . LEFT HEART CATHETERIZATION WITH CORONARY ANGIOGRAM N/A 05/14/2013  Procedure: LEFT HEART CATHETERIZATION WITH CORONARY ANGIOGRAM;  Surgeon: Larey Dresser, MD;  Location: [facility] C[facility] LAB;  Service: Cardiovascular;  Laterality: N/A; . POLYPECTOMY  06/17/2020  Procedure: POLYPECTOMY;  Surgeon: Yetta Flock, MD;  Location: [facility] ENDOSCOPY;  Service: Gastroenterology;; . TONSILLECTOMY   . TOTAL HIP ARTHROPLASTY Left 01/10/2015  Procedure: TOTAL HIP ARTHROPLASTY ANTERIOR APPROACH;  Surgeon: Melrose Nakayama, MD;  Location: [place];  Service: Orthopedics;  Laterality: Left; . TOTAL HIP ARTHROPLASTY Right 05/30/2020  Procedure: RIGHT TOTAL HIP ARTHROPLASTY ANTERIOR APPROACH;  Surgeon: Melrose Nakayama, MD;  Location: [facility] ORS;  Service: Orthopedics;  Laterality: Right; HPI: Pt is an 85 y.o. female with medical history significant of RA, [name], DM, HTN, [name], and CAD s/p CABG who presented with leg pain.  She had physical therapy sessions and became very weak came back to the hospital  was found to have a bleeding ulcer and a DVT. Per neurology's note, pt has had an insidious slow progressive onset of weakness for 3-4 weeks concerning for myopathy. SLP was consulted since dysphagia may be related to myopathy.  No data recorded Assessment / Plan / Recommendation CHL IP CLINICAL IMPRESSIONS 07/13/2020 Clinical Impression Pt's positioning was suboptimal due to her inability to hold her head in an upright position d/t reported weakness. Without physical assistance from SLP and radiology tech, pt maintained a chin tuck posture. Pt exhibited difficulty with bolus propulsion, increased pharyngeal residue, and absent epiglottic inversion in this position, all increasing aspiration risk. Pt presented with  oropharyngeal dysphagia characterized by reduced lingual retraction, reduced anterior laryngeal movement, reduced pharyngeal constriction, and aspiration (PAS 7) of nectar thick liquids secondary to spillover of pyriform sinus residue and delayed swallow initiation. Laryngeal sensation was adequate but coughing was ineffective. She demonstrated moderate vallecular residue, mild posterior pharyngeal wall residue, and mild-moderate pyriform sinus residue. A barium tablet was attempted, but pt refused stating that she needs her pills to be crushed since she is fearful of "choking" on them. Pt stated that she needs her meats to be cut "smaller than a fingernail" for her to be able to masticate them. Pt's diet will be downgraded to dysphagia 2 solids and thin liquids. 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 With support to keep her head in an upright position, her swallow function was improved and risk reduced. The results of the study and the potential use a soft cervical collar to help support pt's neck, and therefore facilitate use of an upright head position, were discussed with Dr. Posey Pronto and he indicated that he will order it. SLP will follow pt. SLP Visit Diagnosis Dysphagia, unspecified (R13.10) Attention and concentration deficit following -- Frontal lobe and executive function deficit following -- Impact on safety and function Mild aspiration risk   CHL IP TREATMENT RECOMMENDATION 07/13/2020 Treatment Recommendations Therapy as outlined in treatment plan below   Prognosis 07/13/2020 Prognosis for Safe Diet Advancement Good Barriers to Reach Goals Severity of deficits Barriers/Prognosis Comment -- CHL IP DIET RECOMMENDATION 07/13/2020 SLP Diet Recommendations Dysphagia 2 (Fine chop) solids;Thin liquid Liquid Administration via Cup;Straw Medication Administration Crushed with puree  Compensations Slow rate;Small sips/bites Postural Changes Seated upright at 90 degrees   CHL IP OTHER RECOMMENDATIONS 07/13/2020 Recommended Consults -- Oral Care Recommendations Oral care BID;Staff/trained caregiver to provide oral care Other Recommendations --   CHL IP FOLLOW UP RECOMMENDATIONS 07/13/2020 Follow up Recommendations (No Data)   CHL IP FREQUENCY AND DURATION 07/13/2020 Speech Therapy Frequency (ACUTE ONLY) min 2x/week Treatment Duration 2 weeks      CHL IP ORAL PHASE 07/13/2020 Oral Phase Impaired Oral - Pudding Teaspoon -- Oral - Pudding Cup -- Oral - Honey Teaspoon -- Oral - Honey Cup Reduced posterior propulsion Oral - Nectar Teaspoon -- Oral - Nectar Cup Reduced posterior propulsion Oral - Nectar Straw Reduced posterior propulsion Oral - Thin Teaspoon -- Oral - Thin Cup -- Oral - Thin Straw Reduced posterior propulsion Oral - Puree Reduced posterior propulsion Oral - Mech Soft Reduced posterior propulsion Oral - Regular Reduced posterior propulsion Oral - Multi-Consistency -- Oral - Pill -- Oral Phase - Comment --  CHL IP PHARYNGEAL PHASE 07/13/2020 Pharyngeal Phase Impaired Pharyngeal- Pudding Teaspoon -- Pharyngeal -- Pharyngeal- Pudding Cup -- Pharyngeal -- Pharyngeal- Honey Teaspoon -- Pharyngeal --  Pharyngeal- Honey Cup Pharyngeal residue - valleculae;Pharyngeal residue - pyriform;Pharyngeal residue - posterior pharnyx;Reduced anterior laryngeal mobility;Reduced epiglottic inversion Pharyngeal -- Pharyngeal- Nectar Teaspoon -- Pharyngeal -- Pharyngeal- Nectar Cup -- Pharyngeal -- Pharyngeal- Nectar Straw Pharyngeal residue - valleculae;Pharyngeal residue - pyriform;Pharyngeal residue - posterior pharnyx;Reduced anterior laryngeal mobility;Reduced epiglottic inversion;Moderate aspiration;Penetration/Aspiration during swallow;Penetration/Apiration after swallow Pharyngeal Material enters airway, passes BELOW cords and not ejected out despite cough attempt by patient Pharyngeal- Thin Teaspoon --  Pharyngeal -- Pharyngeal- Thin Cup -- Pharyngeal -- Pharyngeal- Thin Straw Pharyngeal residue - valleculae;Pharyngeal residue - pyriform;Pharyngeal residue - posterior pharnyx;Reduced anterior laryngeal mobility;Reduced epiglottic inversion Pharyngeal -- Pharyngeal- Puree Pharyngeal residue - valleculae;Pharyngeal residue - pyriform;Pharyngeal residue - posterior pharnyx;Reduced anterior laryngeal mobility;Reduced epiglottic inversion Pharyngeal -- Pharyngeal- Mechanical Soft Pharyngeal residue - valleculae;Pharyngeal residue - pyriform;Pharyngeal residue - posterior pharnyx;Reduced anterior laryngeal mobility;Reduced epiglottic inversion Pharyngeal -- Pharyngeal- Regular Pharyngeal residue - valleculae;Pharyngeal residue - pyriform;Pharyngeal residue - posterior pharnyx;Reduced anterior laryngeal mobility;Reduced epiglottic inversion Pharyngeal -- Pharyngeal- Multi-consistency -- Pharyngeal -- Pharyngeal- Pill -- Pharyngeal -- Pharyngeal Comment --  Patricia Villegas, [place], [place] Office number [phone] Pager [phone] No flowsheet data found. Horton Marshall 07/13/2020, 3:11 PM                Principal Problem:   Rhabdomyolysis Active Problems:   Essential hypertension   Mixed hyperlipidemia   Peripheral vascular disease ([place])   CAD (coronary artery disease) of artery bypass graft   Rheumatoid arthritis ([place])   Chronic diastolic CHF (congestive heart failure) ([place])   Type 2 diabetes mellitus with diabetic polyneuropathy, with long-term current use of insulin ([place])   Myositis   Constipation   LFT elevation     LOS: 4 days     Carmell Austria, MD 07/15/2020, 11:04 AM [facility] [phone]

## 2020-07-15 NOTE — Progress Notes (Signed)
Patient arrived to the  Unit at 1145 am on 3 liters nasal cannula  1200,  [place] increased to 5liters for inability to maintain sats above 88%  1312, Patient calls out that she cannot breathe, Rhonchi, wheezing  Increased work of breathing, NRB applied, RR nurse called, message to Dr Posey Pronto  1320 Albuterol nebulizer given   1327 NRP reapplied after neb, Discuss and call respiratory therapy for Bipap  1330  RT arrives with Bipap  1335 after explainations Bipap applied  1615 Patient respiratory rate 18 and WOB decreased, patient reports feeling much better  1730  Patient asks to have Bipap removed for a short period of time, NRB reapplied

## 2020-07-15 NOTE — Progress Notes (Signed)
Peripherally Inserted Central Catheter Placement  The IV Nurse has discussed with the patient and/or persons authorized to consent for the patient, the purpose of this procedure and the potential benefits and risks involved with this procedure.  The benefits include less needle sticks, lab draws from the catheter, and the patient may be discharged home with the catheter. Risks include, but not limited to, infection, bleeding, blood clot (thrombus formation), and puncture of an artery; nerve damage and irregular heartbeat and possibility to perform a PICC exchange if needed/ordered by physician.  Alternatives to this procedure were also discussed.  Bard Power PICC patient education guide, fact sheet on infection prevention and patient information card has been provided to patient /or left at bedside.  Witness observed pt sign consent due to pt hands swollen had difficulty writing.  PICC Placement Documentation  PICC Double Lumen 81/85/63 PICC Right Basilic 41 cm 0 cm (Active)  Indication for Insertion or Continuance of Line Poor Vasculature-patient has had multiple peripheral attempts or PIVs lasting less than 24 hours 07/15/20 1547  Exposed Catheter (cm) 0 cm 07/15/20 1547  Site Assessment Clean;Dry;Intact 07/15/20 1547  Lumen #1 Status Flushed;Saline locked;Blood return noted 07/15/20 1547  Lumen #2 Status Flushed;Saline locked;Blood return noted 07/15/20 1547  Dressing Type Transparent 07/15/20 1547  Dressing Status Clean;Dry;Intact 07/15/20 1547  Antimicrobial disc in place? Yes 07/15/20 1547  Safety Lock Not Applicable 14/97/02 6378  Line Care Connections checked and tightened 07/15/20 1547  Line Adjustment (NICU/IV Team Only) No 07/15/20 1547  Dressing Intervention New dressing 07/15/20 1547  Dressing Change Due 07/22/20 07/15/20 1547       Patricia Villegas 07/15/2020, 3:48 PM

## 2020-07-15 NOTE — Progress Notes (Signed)
Neurology Progress Note  Patient ID: Patricia Villegas is a 85 y.o. with Patricia Villegas is a 85 y.o. female with history of [name], DVT on Eliquis, rheumatoid arthritis, hypertension, hypercholesterolemia, CAD, recent right total hip arthroplasty, recent GI bleed secondary to H. Pylori infection, anemia, diabetes, coronary artery disease status post CABG.   Initially consulted for: c/f myositis given elevated CK  Major interval events/subjective:  -Has felt short of breath since choking episode on 11/5  Exam: Vitals:   07/15/20 1944 07/15/20 2029  BP: 137/73 (!) 142/68  Pulse: 83 79  Resp: 18 (!) 22  Temp: 97.9 F (36.6 C)   SpO2: 100% 100%   Gen: In bed, distressed Affect: Anxious Resp: Tachypneic with some audible wheezing, possible crackles in the right lower lobe Cardiac: Perfusing extremities well.  Abd: soft, nt Extremities: scattered ecchymosis.  Somewhat improving edema  Neuro: MS: Alert awake and oriented to person, place, time, situation CN: Face symmetric, EOMI intact to tracking examiner Motor: Proximally 1/5 in the arms, distally 4/5. 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  Unfortunately the patient is experiencing significant shortness of breath which I feel is more likely secondary to an aspiration event, given the temporal history provided by the patient and  family, rather than due to progressive neuromuscular weakness such as from myasthenia.  She continues to deny diplopia or other typical features of myasthenia.  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  At this time we are in agreement to that risks of stopping Protonix outweigh the benefit, given known GI bleed and likely alternative explanation for myositis (autoimmune).  However should the patient fail to respond to immune modulating treatment, the potential role of Protonix can be reconsidered  # Shortness of breath -Appreciate primary team management, concern for aspiration pneumonia -AChR antibody pending as above -NIF/FVC q12hr  -CXR 11/4 AM was clear, may need repeat if she continues to have increasing respiratory issues given she does have some dysphagia  # Dry mouth - chloraseptic mouth spray  Lesleigh Noe MD-PhD [facility] [phone]

## 2020-07-15 NOTE — NC FL2 (Signed)
[place] LEVEL OF CARE SCREENING TOOL     IDENTIFICATION  Patient Name: Patricia Villegas Birthdate: 07/23/1935 Sex: female Admission Date (Current Location): 07/10/2020  [place] and [place] Number:  Herbalist and Address:  [facility] [place], [place] [address] [place],  [address]      Provider Number: [id number]  Attending Physician Name and Address:  Lavina Hamman, MD  Relative Name and Phone Number:       Current Level of Care: Hospital Recommended Level of Care: [place] Prior Approval Number:    Date Approved/Denied:   PASRR Number: [id number]  Discharge Plan: SNF    Current Diagnoses: Patient Active Problem List   Diagnosis Date Noted  . LFT elevation   . Rhabdomyolysis 07/18/2020  . Myositis 08/01/2020  . Constipation 08/04/2020  . Generalized weakness 07/02/2020  . Type 2 diabetes mellitus with diabetic polyneuropathy, with long-term current use of insulin ([place]) 07/02/2020  . Coronary artery disease involving native coronary artery of native heart without angina pectoris 07/02/2020  . Bilateral leg edema 07/02/2020  . Duodenal ulcer   . Benign neoplasm of sigmoid colon   . Gastrointestinal hemorrhage   . Macrocytic anemia 06/15/2020  . Symptomatic anemia 06/14/2020  . Primary localized osteoarthritis of right hip 05/30/2020  . S/P total hip arthroplasty 05/30/2020  . Primary osteoarthritis of right hip 05/30/2020  . Carotid bruit 03/21/2016  . Primary osteoarthritis of left hip 01/10/2015  . Chronic diastolic CHF (congestive heart failure) ([place]) 06/30/2013  . CAD (coronary artery disease) of artery bypass graft 05/18/2013  . Femoral artery occlusion, bilateral 05/18/2013  . Rheumatoid arthritis ([place]) 05/18/2013  . Chest pain 09/18/2011  . Peripheral vascular disease ([place]) 09/18/2011  . Essential hypertension 08/23/2011  . Mixed hyperlipidemia 08/23/2011    Orientation RESPIRATION BLADDER  Height & Weight     Self, Time, Situation, Place  O2 (See discharge summary) Incontinent Weight: 146 lb (66.2 kg) Height:  5\' 6"  (167.6 cm)  BEHAVIORAL SYMPTOMS/MOOD NEUROLOGICAL BOWEL NUTRITION STATUS      Incontinent Diet  AMBULATORY STATUS COMMUNICATION OF NEEDS Skin   Extensive Assist Verbally Surgical wounds                       Personal Care Assistance Level of Assistance  Bathing, Feeding, Dressing Bathing Assistance: Maximum assistance Feeding assistance: Maximum assistance Dressing Assistance: Maximum assistance     Functional Limitations Info  Sight, Hearing, Speech Sight Info: Adequate Hearing Info: Adequate Speech Info: Adequate    SPECIAL CARE FACTORS FREQUENCY  PT (By licensed PT), OT (By licensed OT)     PT Frequency: 5x a week OT Frequency: 5x a week            Contractures Contractures Info: Not present    Additional Factors Info  Code Status, Allergies Code Status Info: DNR Allergies Info: NKA           Current Medications (07/15/2020):  This is the current hospital active medication list Current Facility-Administered Medications  Medication Dose Route Frequency Provider Last Rate Last Admin  . acetaminophen (TYLENOL) tablet 650 mg  650 mg Oral Q6H PRN Karmen Bongo, MD   650 mg at 07/14/20 1156   Or  . acetaminophen (TYLENOL) suppository 650 mg  650 mg Rectal Q6H PRN Karmen Bongo, MD      . albuterol (PROVENTIL) (2.5 MG/3ML) 0.083% nebulizer solution 2.5 mg  2.5 mg Nebulization Q4H PRN Posey Pronto,  Josetta Huddle, MD   2.5 mg at 07/14/20 1543  . allopurinol (ZYLOPRIM) tablet 200 mg  200 mg Oral Daily Karmen Bongo, MD   200 mg at 07/15/20 0857  . apixaban (ELIQUIS) tablet 5 mg  5 mg Oral BID Lavina Hamman, MD      . bisacodyl (DULCOLAX) EC tablet 5 mg  5 mg Oral Daily PRN Karmen Bongo, MD      . docusate (COLACE) 50 MG/5ML liquid 100 mg  100 mg Oral BID Lavina Hamman, MD   100 mg at 07/14/20 2103  . feeding supplement (GLUCERNA SHAKE)  (GLUCERNA SHAKE) liquid 237 mL  237 mL Oral TID BM Lavina Hamman, MD   237 mL at 07/14/20 2106  . folic acid (FOLVITE) tablet 2 mg  2 mg Oral Daily Karmen Bongo, MD   2 mg at 07/15/20 0856  . furosemide (LASIX) injection 40 mg  40 mg Intravenous BID Vernelle Emerald, MD   40 mg at 07/15/20 0855  . gabapentin (NEURONTIN) capsule 100 mg  100 mg Oral BID Bhagat, Srishti L, MD   100 mg at 07/15/20 0903  . gabapentin (NEURONTIN) tablet 300 mg  300 mg Oral QHS Bhagat, Srishti L, MD   300 mg at 07/14/20 2103  . hydrALAZINE (APRESOLINE) injection 5 mg  5 mg Intravenous Q4H PRN Karmen Bongo, MD      . insulin aspart (novoLOG) injection 0-9 Units  0-9 Units Subcutaneous TID WC Karmen Bongo, MD   2 Units at 07/15/20 0857  . insulin glargine (LANTUS) injection 10 Units  10 Units Subcutaneous QPM Karmen Bongo, MD   10 Units at 07/14/20 1820  . liver oil-zinc oxide (DESITIN) 40 % ointment 1 application  1 application Topical TID Karmen Bongo, MD   1 application at 47/42/59 0855  . melatonin tablet 3 mg  3 mg Oral QHS Karmen Bongo, MD   3 mg at 07/14/20 2103  . methylPREDNISolone sodium succinate (SOLU-MEDROL) 1,000 mg in sodium chloride 0.9 % 50 mL IVPB  1,000 mg Intravenous Daily Bhagat, Srishti L, MD 58 mL/hr at 07/14/20 1326 1,000 mg at 07/14/20 1326  . metoCLOPramide (REGLAN) tablet 5 mg  5 mg Oral QHS Karmen Bongo, MD   5 mg at 07/14/20 2103  . metoprolol tartrate (LOPRESSOR) tablet 12.5 mg  12.5 mg Oral BID Karmen Bongo, MD   12.5 mg at 07/15/20 0855  . morphine 2 MG/ML injection 2 mg  2 mg Intravenous Q2H PRN Lavina Hamman, MD      . ondansetron [facility]) tablet 4 mg  4 mg Oral Q6H PRN Karmen Bongo, MD       Or  . ondansetron [facility]) injection 4 mg  4 mg Intravenous Q6H PRN Karmen Bongo, MD      . oxyCODONE (Oxy IR/ROXICODONE) immediate release tablet 5 mg  5 mg Oral Q4H PRN Lavina Hamman, MD   5 mg at 07/14/20 1156  . pantoprazole sodium (PROTONIX) 40 mg/20 mL oral  suspension 40 mg  40 mg Oral BID Vena Rua, PA-C   40 mg at 07/15/20 0856  . phenol (CHLORASEPTIC) mouth spray 1 spray  1 spray Mouth/Throat PRN Bhagat, Srishti L, MD      . polyethylene glycol (MIRALAX / GLYCOLAX) packet 17 g  17 g Oral Daily PRN Lavina Hamman, MD      . senna-docusate (Senokot-S) tablet 1 tablet  1 tablet Oral Daily PRN Karmen Bongo, MD      .  tiZANidine (ZANAFLEX) tablet 2 mg  2 mg Oral Q6H PRN Karmen Bongo, MD   2 mg at 07/13/20 0241  . traMADol (ULTRAM) tablet 50 mg  50 mg Oral Q6H PRN Lavina Hamman, MD   50 mg at 07/13/20 2006     Discharge Medications: Please see discharge summary for a list of discharge medications.  Relevant Imaging Results:  Relevant Lab Results:   Additional Information SSN [place] [address] [place]

## 2020-07-15 NOTE — Plan of Care (Signed)
  Problem: Health Behavior/Discharge Planning: Goal: Ability to manage health-related needs will improve Outcome: Progressing   Problem: Activity: Goal: Risk for activity intolerance will decrease Outcome: Progressing   Problem: Nutrition: Goal: Adequate nutrition will be maintained Outcome: Progressing   Problem: Coping: Goal: Level of anxiety will decrease Outcome: Progressing   Problem: Elimination: Goal: Will not experience complications related to bowel motility Outcome: Progressing Goal: Will not experience complications related to urinary retention Outcome: Progressing   Problem: Pain Managment: Goal: General experience of comfort will improve Outcome: Progressing   Problem: Safety: Goal: Ability to remain free from injury will improve Outcome: Progressing   

## 2020-07-15 NOTE — TOC Initial Note (Signed)
Transition of Care [facility]) - Initial/Assessment Note    Patient Details  Name: Patricia Villegas MRN: [id number] Date of Birth: 1935-05-08  [facility]) CM/SW Contact:    Emeterio Reeve, [place] Phone Number: 07/15/2020, 11:18 AM  Clinical Narrative:                 CSW met with pt at bedside. CSW introduced self and explained her role at the hospital.  Pts daughter in law was present for assessment. Pt stated PTA she lived at home with her husband. Pts states since her last admission she has required assistance to complete ADL's.   CSW reviewed pt/ot reccs of SNF. Pt stated she was recently at [facility] and would like to return. CSW gave pt permission to also fax out to other places in the area as backup.   Expected Discharge Plan: Skilled Nursing Facility Barriers to Discharge: Continued Medical Work up   Patient Goals and CMS Choice Patient states their goals for this hospitalization and ongoing recovery are:: TO get better CMS Medicare.gov Compare Post Acute Care list provided to:: Patient Choice offered to / list presented to : Patient  Expected Discharge Plan and Services Expected Discharge Plan: [place] arrangements for the past 2 months: Single Family Home                                      Prior Living Arrangements/Services Living arrangements for the past 2 months: Single Family Home Lives with:: Spouse Patient language and need for interpreter reviewed:: Yes        Need for Family Participation in Patient Care: Yes (Comment) Care giver support system in place?: Yes (comment)   Criminal Activity/Legal Involvement Pertinent to Current Situation/Hospitalization: No - Comment as needed  Activities of Daily Living Home Assistive Devices/Equipment: Bedside commode/3-in-1, Walker (specify type) ADL Screening (condition at time of admission) Patient's cognitive ability adequate to safely complete daily activities?: Yes Is  the patient deaf or have difficulty hearing?: No Does the patient have difficulty seeing, even when wearing glasses/contacts?: Yes Does the patient have difficulty concentrating, remembering, or making decisions?: Yes Patient able to express need for assistance with ADLs?: Yes Does the patient have difficulty dressing or bathing?: Yes Independently performs ADLs?: No Communication: Independent Dressing (OT): Dependent Is this a change from baseline?: Pre-admission baseline Grooming: Needs assistance Is this a change from baseline?: Pre-admission baseline Feeding: Needs assistance Is this a change from baseline?: Pre-admission baseline Bathing: Dependent Is this a change from baseline?: Pre-admission baseline Toileting: Dependent Is this a change from baseline?: Pre-admission baseline In/Out Bed: Dependent Is this a change from baseline?: Pre-admission baseline Walks in Home: Needs assistance Is this a change from baseline?: Pre-admission baseline Does the patient have difficulty walking or climbing stairs?: Yes Weakness of Legs: Both Weakness of Arms/Hands: Both  Permission Sought/Granted Permission sought to share information with : Facility Sport and exercise psychologist, Family Supports Permission granted to share information with : Yes, Verbal Permission Granted  Share Information with NAME: Daesha Insco  Permission granted to share info w AGENCY: SNF  Permission granted to share info w Relationship: son  Permission granted to share info w Contact Information: [phone]  Emotional Assessment Appearance:: Appears stated age Attitude/Demeanor/Rapport: Engaged Affect (typically observed): Appropriate Orientation: : Oriented to Self, Oriented to Place, Oriented to  Time, Oriented to Situation Alcohol /  Substance Use: Not Applicable Psych Involvement: No (comment)  Admission diagnosis:  Rhabdomyolysis [M62.82] Non-traumatic rhabdomyolysis [M62.82] Patient Active Problem List    Diagnosis Date Noted   LFT elevation    Rhabdomyolysis 08/01/2020   Myositis 07/22/2020   Constipation 07/13/2020   Generalized weakness 07/02/2020   Type 2 diabetes mellitus with diabetic polyneuropathy, with long-term current use of insulin ([place]) 07/02/2020   Coronary artery disease involving native coronary artery of native heart without angina pectoris 07/02/2020   Bilateral leg edema 07/02/2020   Duodenal ulcer    Benign neoplasm of sigmoid colon    Gastrointestinal hemorrhage    Macrocytic anemia 06/15/2020   Symptomatic anemia 06/14/2020   Primary localized osteoarthritis of right hip 05/30/2020   S/P total hip arthroplasty 05/30/2020   Primary osteoarthritis of right hip 05/30/2020   Carotid bruit 03/21/2016   Primary osteoarthritis of left hip 01/10/2015   Chronic diastolic CHF (congestive heart failure) ([place]) 06/30/2013   CAD (coronary artery disease) of artery bypass graft 05/18/2013   Femoral artery occlusion, bilateral 05/18/2013   Rheumatoid arthritis ([place]) 05/18/2013   Chest pain 09/18/2011   Peripheral vascular disease ([place]) 09/18/2011   Essential hypertension 08/23/2011   Mixed hyperlipidemia 08/23/2011   PCP:  Haywood Pao, MD Pharmacy:   [place], [place] [address] Phone: [phone] Fax: [phone]     Social Determinants of Health (SDOH) Interventions    Readmission Risk Interventions Readmission Risk Prevention Plan 07/15/2020  SW Recovery Care/Counseling Consult Complete  Skilled Nursing Facility Complete  Some recent data might be hidden   Emeterio Reeve, Latanya Presser, [place] Social Worker [phone]

## 2020-07-15 NOTE — Progress Notes (Signed)
Spoke with Pamala Hurry RN- notified to remove RFA PIV and connect Heparin to LA PIV since PICC is in RUE.

## 2020-07-15 NOTE — Progress Notes (Signed)
[facility] Progress Note  Patient: Patricia Villegas    RJJ:[id number]  DOA: 07/27/2020     Date of Service: the patient was seen and examined on 07/15/2020  Brief hospital course: Past medical history of RA; [name]; DM; HTN; [name]; and CAD s/p CABG presenting with leg pain. Found to have severe rhabdomyolysis along with elevated LFT. Neurology was consulted. GI was consulted for melena history.  Continues to have shortness of breath and now transferred to progressive care unit on BiPAP. Currently plan is continue current care.  Assessment and Plan: 1. Nontraumatic rhabdomyolysis Polymyositis Generalized weakness of both upper and lower extremity primarily proximal along with pain in lower extremity. CK continues to trend up despite IV LR.  Patient appears to be volume overloading. Patient was on Zocor currently on hold. Hepatic function panel also shows worsening LFT likely secondary to myositis. Neurology was consulted. Currently on high-dose steroids with 1 g IV Solu-Medrol. On chronic prednisone secondary to history of rheumatoid arthritis. Okay to start the patient back on methotrexate at home prior to surgery dose as well. CK trending down with steroids.  2. Recent DVT Diagnosed in October 2021. Started on Eliquis. Currently reports melena. GI was consulted. Currently no plan for intervention.  Tolerating heparin and monitor.  3. Acute on chronic anemia with melena Acute on chronic blood loss anemia. Recently hospitalized for anemia underwent EGD found to have duodenal ulcer. Continuingtwice dailyPPI. Discontinue Eliquis for now. If continues to have drop in hemoglobin will require transfusion for hemoglobin less than 7.  4. Urinary retention Monitor for now.Resolved  5.  Acute on chronic diastolic CHF Essential hypertension CAD SP CABG Acute hypoxic respiratory failure secondary to CHF Appears volume overloaded. IV albumin every 6 hours followed by IV  Lasix. Daily weight and strict I's and O's.  6. Rheumatoid arthritis Holding Plaquenil. Neuro prefers to start methotrexate again. Okay to start methotrexate.  7.Dysphagia. Secondary to generalized weakness and fatigue but other than focal deficit. Underwent speech therapy evaluation with modified barium swallow. Speech recommends cervical collar to support the head to minimize aspiration and also dysphagia 2 diet as she has weakness with mastication. Ordered Glucerna.  Diet: Cardiac diet DVT Prophylaxis: Therapeutic Anticoagulation with heparin  Place and maintain sequential compression device Start: 07/14/20 1122    Advance goals of care discussion: DNR  Family Communication: no family was present at bedside, at the time of interview.  The pt provided permission to discuss medical plan with the family.  Discussed with son on the phone. Opportunity was given to ask question and all questions were answered satisfactorily.   Disposition:  Status is: Inpatient  Remains inpatient appropriate because:Hemodynamically unstable   Dispo: The patient is from: Home              Anticipated d/c is to: SNF              Anticipated d/c date is: > 3 days              Patient currently is not medically stable to d/c.  Subjective: No nausea no vomiting.  Reports being tired and fatigued having significant shortness of breath.  Also reports losing voice.  No chest pain but reports pain radiating to her left shoulder.  No diarrhea no constipation but no bleeding.  Physical Exam:  General: Appear in marked distress, no Rash; Oral Mucosa Clear, moist. no Abnormal Neck Mass Or lumps, Conjunctiva normal  Cardiovascular: S1 and S2 Present, no Murmur, Respiratory:  increased respiratory effort, Bilateral Air entry present and bilateral  Crackles, no wheezes Abdomen: Bowel Sound present, Soft and no tenderness Extremities: bilateral  Pedal edema Neurology: alert and oriented to time, place,  and person affect appropriate. no new focal deficit Gait not checked due to patient safety concerns  Vitals:   07/15/20 0826 07/15/20 1128 07/15/20 1300 07/15/20 1352  BP: 131/60 (!) 119/56 (!) 56/35   Pulse: 64 67 71 89  Resp: 16 20 (!) 25 (!) 30  Temp: 97.6 F (36.4 C) 97.6 F (36.4 C)    TempSrc: Oral Oral    SpO2: 100% (!) 8% (!) 89% 97%  Weight:      Height:        Intake/Output Summary (Last 24 hours) at 07/15/2020 1418 Last data filed at 07/15/2020 0500 Gross per 24 hour  Intake 297.65 ml  Output 400 ml  Net -102.35 ml   Filed Weights   07/12/2020 0031  Weight: 66.2 kg    Data Reviewed: I have personally reviewed and interpreted daily labs, tele strips, imagings as discussed above. I reviewed all nursing notes, pharmacy notes, vitals, pertinent old records I have discussed plan of care as described above with RN and patient/family.  CBC: Recent Labs  Lab 07/12/20 1700 07/13/20 0437 07/13/20 1651 07/14/20 0342 07/15/20 0529  WBC 37.5* 34.2* 28.9* 32.0* 32.2*  HGB 9.2* 8.2* 9.4* 8.3* 9.5*  HCT 28.2* 25.5* 28.7* 25.6* 28.6*  MCV 102.5* 103.7* 102.9* 105.8* 105.1*  PLT 240 215 207 218 563   Basic Metabolic Panel: Recent Labs  Lab 07/27/2020 0111 08/06/2020 0459 07/12/20 1700 07/13/20 0437 07/13/20 1651 07/14/20 0342 07/15/20 0529  NA 141   < > 137 138 138 140 142  K 4.0   < > 4.1 4.2 4.4 4.4 4.5  CL 106   < > 102 107 106 107 109  CO2 26   < > 23 23 23  19* 22  GLUCOSE 106*   < > 183* 171* 203* 189* 200*  BUN 23   < > 24* 25* 27* 33* 44*  CREATININE 0.65   < > 0.60 0.67 0.68 0.64 0.62  CALCIUM 8.4*   < > 8.3* 8.0* 8.0* 8.0* 8.0*  MG 2.2  --   --   --   --   --   --    < > = values in this interval not displayed.    Studies: DG CHEST PORT 1 VIEW  Result Date: 07/15/2020 CLINICAL DATA:  Short of breath. EXAM: PORTABLE CHEST 1 VIEW COMPARISON:  07/13/2020 FINDINGS: Previous median sternotomy and CABG procedure. Aortic atherosclerotic calcifications.  Mitral annular calcifications. Bilateral pleural effusions are noted, right greater than left. Pulmonary vascular congestion and trace edema. No airspace densities. IMPRESSION: 1. Mild congestive heart failure. 2.  Aortic Atherosclerosis (ICD10-I70.0). Electronically Signed   By: Kerby Moors M.D.   On: 07/15/2020 11:19   [place] EKG SITE RITE  Result Date: 07/15/2020 If Site Rite image not attached, placement could not be confirmed due to current cardiac rhythm.   Scheduled Meds:  furosemide  40 mg Intravenous Q6H   gabapentin  100 mg Oral BID   gabapentin  300 mg Oral QHS   insulin aspart  0-9 Units Subcutaneous TID WC   insulin glargine  10 Units Subcutaneous QPM   liver oil-zinc oxide  1 application Topical TID   magic mouthwash  5 mL Oral QID   metoprolol tartrate  12.5 mg Oral BID   pantoprazole sodium  40 mg Oral BID   Continuous Infusions:  albumin human     heparin 600 Units/hr (07/15/20 1058)   methylPREDNISolone (SOLU-MEDROL) injection 1,000 mg (07/14/20 1326)   PRN Meds: acetaminophen **OR** acetaminophen, albuterol, hydrALAZINE, morphine injection, ondansetron **OR** ondansetron (ZOFRAN) IV, oxyCODONE, phenol, polyethylene glycol, senna-docusate, tiZANidine, traMADol  Time spent: 35 minutes  Author: Berle Mull, MD [facility] 07/15/2020 2:18 PM  To reach On-call, see care teams to locate the attending and reach out via [email or web address]. Between 7PM-7AM, please contact night-coverage If you still have difficulty reaching the attending provider, please page the [facility] (Director on Call) for [facility] on amion for assistance.

## 2020-07-15 NOTE — Progress Notes (Signed)
Orthopedic Tech Progress Note Patient Details:  Patricia Villegas [birth year] [id number] RN stated patient has brace Patient ID: Patricia Villegas, female   DOB: 30-Dec-1934, 85 y.o.   MRN: [id number]   [name] 07/15/2020, 11:28 AM

## 2020-07-15 NOTE — Progress Notes (Signed)
ANTICOAGULATION CONSULT NOTE   Pharmacy Consult for Heparin Indication: Recent  DVT  No Known Allergies  Patient Measurements: Height: 5\' 6"  (167.6 cm) Weight: 66.2 kg (146 lb) IBW/kg (Calculated) : 59.3 Heparin Dosing Weight: 66.2 kg  Vital Signs: Temp: 97.7 F (36.5 C) (11/06 1543) Temp Source: Oral (11/06 1543) BP: 128/59 (11/06 1543) Pulse Rate: 81 (11/06 1543)  Labs: Recent Labs    07/13/20 0437 07/13/20 0437 07/13/20 1651 07/13/20 1651 07/13/20 2058 07/14/20 0342 07/14/20 0633 07/14/20 1830 07/15/20 0529 07/15/20 1101 07/15/20 1635  HGB 8.2*   < > 9.4*   < >  --  8.3*  --   --  9.5*  --   --   HCT 25.5*   < > 28.7*  --   --  25.6*  --   --  28.6*  --   --   PLT 215   < > 207  --   --  218  --   --  206  --   --   APTT  --   --   --    < > 50* 153*   < > >200* 141*  --  72*  LABPROT 15.1  --   --   --   --  16.4*  --   --  15.5*  --   --   INR 1.2  --   --   --   --  1.4*  --   --  1.3*  --   --   HEPARINUNFRC  --   --   --   --   --  2.08*  --   --  1.88*  --  1.16*  CREATININE 0.67   < > 0.68  --   --  0.64  --   --  0.62  --   --   CKTOTAL 27,326*   < >  --   --  62,952* 84,132*  --   --   --  44,010*  --   TROPONINIHS  --   --   --   --   --   --   --   --   --  72* 74*   < > = values in this interval not displayed.    Estimated Creatinine Clearance: 49.9 mL/min (by C-G formula based on SCr of 0.62 mg/dL).   Medical History: Past Medical History:  Diagnosis Date  . Anemia   . Anemia 06/14/2020  . Anxiety   . Arthritis   . Blood transfusion   . Chest pain    a. Lexiscan Myoview 12/12:  EF 75%, no ischemia or scar;  b.  Echo 12/12:  EF 60-65%, no sig valvular abnormalities   . Closed left fibular fracture    Casted   . Complication of anesthesia 1998   Unable to arouse fully, respiratory depression, pt. reports that the family was told that she would need a trach., but then she stabilized & didn't need it   . Coronary artery disease    sees Dr  Aundra Dubin every 6 months  . Depression   . Diabetes mellitus    Greater than 20 years type 2   . GERD (gastroesophageal reflux disease)   . H/O hiatal hernia   . Hypercholesteremia   . Hypertension    Greater than 20 years  . Lumbar disc disease    S/p surgery x4;  Sciatica   . Neuropathy due to secondary diabetes ([place])  Hx: of  . Parastomal hernia of ileal conduit 2009  . Pneumonia    s/p CABG  . [name] (peripheral vascular disease) (HCC)    Stable claudication. Arteriogram 07/2009 no significant aortoiliac disease, has bilateral SFA occlusions with distal reconstitution   . Rheumatoid arthritis ([place])    Dr Berna Bue @ [facility]  . Transfusion reaction 1960   Convulsions (? seizure)  . UTI (lower urinary tract infection) 01/03/15    Medications:  Scheduled:  . Chlorhexidine Gluconate Cloth  6 each Topical Daily  . furosemide  40 mg Intravenous Q6H  . gabapentin  100 mg Oral BID  . gabapentin  300 mg Oral QHS  . insulin aspart  0-9 Units Subcutaneous TID WC  . insulin glargine  10 Units Subcutaneous QPM  . liver oil-zinc oxide  1 application Topical TID  . magic mouthwash  5 mL Oral QID  . metoprolol tartrate  12.5 mg Oral BID  . pantoprazole sodium  40 mg Oral BID  . sodium chloride flush  10-40 mL Intracatheter Q12H    Assessment: 85 y.o.femalewith medical history significant ofRA; [name]; DM; HTN; [name]; and CAD s/p CABG presenting with leg pain. Patient with possible myositis. Started on apixaban recently within last few weeks for acute left leg DVT. Patient started on heparin due to possible biopsy/procedure. Was held on 11/4 due to concerns for melena and resumed 11/5. Confirmed no infusion issues or further concerns for bleeding with nursing.   Pharmacy initially consulted to resume apixaban today given melena resolution and no future procedures planned; however, patient now NPO given concern for aspiration at this time. Heparin infusion off for ~1h. Will resume  @ 600 units/hr. No apixaban given this AM. Follow-up 6-hour aPTT/HL.  PM f/u > heparin level still falsely elevated from recent apixaban.  Aptt within goal range on IV heparin at 600 units/hr.  No overt bleeding or complications noted.  Goal of Therapy:  Monitor platelets by anticoagulation protocol: Yes   Plan:  Continue IV heparin at current rate. Daily aPTT and heparin level. F/u plans to eventually resume apixaban.  [place] Crane, Roylene Reason, BCCP Clinical Pharmacist  07/15/2020 5:57 PM   [facility] pharmacy phone numbers are listed on [email or web address]

## 2020-07-15 NOTE — Progress Notes (Signed)
Placed patient on BiPAP per MD's order patient tolerated well.

## 2020-07-16 ENCOUNTER — Inpatient Hospital Stay (HOSPITAL_COMMUNITY): Payer: Medicare Other

## 2020-07-16 DIAGNOSIS — K5901 Slow transit constipation: Secondary | ICD-10-CM

## 2020-07-16 DIAGNOSIS — R7989 Other specified abnormal findings of blood chemistry: Secondary | ICD-10-CM | POA: Diagnosis not present

## 2020-07-16 DIAGNOSIS — M6282 Rhabdomyolysis: Secondary | ICD-10-CM | POA: Diagnosis not present

## 2020-07-16 LAB — COMPREHENSIVE METABOLIC PANEL
ALT: 422 U/L — ABNORMAL HIGH (ref 0–44)
AST: 193 U/L — ABNORMAL HIGH (ref 15–41)
Albumin: 2.6 g/dL — ABNORMAL LOW (ref 3.5–5.0)
Alkaline Phosphatase: 54 U/L (ref 38–126)
Anion gap: 14 (ref 5–15)
BUN: 39 mg/dL — ABNORMAL HIGH (ref 8–23)
CO2: 28 mmol/L (ref 22–32)
Calcium: 8.3 mg/dL — ABNORMAL LOW (ref 8.9–10.3)
Chloride: 106 mmol/L (ref 98–111)
Creatinine, Ser: 0.64 mg/dL (ref 0.44–1.00)
GFR, Estimated: >60 mL/min (ref >60)
Glucose, Bld: 147 mg/dL — ABNORMAL HIGH (ref 70–99)
Potassium: 2.2 mmol/L — CL (ref 3.5–5.1)
Sodium: 148 mmol/L — ABNORMAL HIGH (ref 135–145)
Total Bilirubin: 1.2 mg/dL (ref 0.3–1.2)
Total Protein: 4.7 g/dL — ABNORMAL LOW (ref 6.5–8.1)

## 2020-07-16 LAB — CMV IGM: CMV IgM: <30 AU/mL (ref 0.0–29.9)

## 2020-07-16 LAB — CBC
HCT: 24.5 % — ABNORMAL LOW (ref 36.0–46.0)
HCT: 21.7 % — ABNORMAL LOW (ref 36.0–46.0)
Hemoglobin: 7.7 g/dL — ABNORMAL LOW (ref 12.0–15.0)
Hemoglobin: 6.7 g/dL — CL (ref 12.0–15.0)
MCH: 33.3 pg (ref 26.0–34.0)
MCH: 33.5 pg (ref 26.0–34.0)
MCHC: 31.4 g/dL (ref 30.0–36.0)
MCHC: 30.9 g/dL (ref 30.0–36.0)
MCV: 106.1 fL — ABNORMAL HIGH (ref 80.0–100.0)
MCV: 108.5 fL — ABNORMAL HIGH (ref 80.0–100.0)
Platelets: 187 10*3/uL (ref 150–400)
Platelets: 143 10*3/uL — ABNORMAL LOW (ref 150–400)
RBC: 2.31 MIL/uL — ABNORMAL LOW (ref 3.87–5.11)
RBC: 2 MIL/uL — ABNORMAL LOW (ref 3.87–5.11)
RDW: 19.9 % — ABNORMAL HIGH (ref 11.5–15.5)
RDW: 19.9 % — ABNORMAL HIGH (ref 11.5–15.5)
WBC: 30.1 10*3/uL — ABNORMAL HIGH (ref 4.0–10.5)
WBC: 22.7 10*3/uL — ABNORMAL HIGH (ref 4.0–10.5)
nRBC: 0.7 % — ABNORMAL HIGH (ref 0.0–0.2)
nRBC: 0.4 % — ABNORMAL HIGH (ref 0.0–0.2)

## 2020-07-16 LAB — BASIC METABOLIC PANEL
Anion gap: 14 (ref 5–15)
BUN: 38 mg/dL — ABNORMAL HIGH (ref 8–23)
CO2: 26 mmol/L (ref 22–32)
Calcium: 8.3 mg/dL — ABNORMAL LOW (ref 8.9–10.3)
Chloride: 107 mmol/L (ref 98–111)
Creatinine, Ser: 0.63 mg/dL (ref 0.44–1.00)
GFR, Estimated: >60 mL/min (ref >60)
Glucose, Bld: 192 mg/dL — ABNORMAL HIGH (ref 70–99)
Potassium: 3.4 mmol/L — ABNORMAL LOW (ref 3.5–5.1)
Sodium: 147 mmol/L — ABNORMAL HIGH (ref 135–145)

## 2020-07-16 LAB — GLUCOSE, CAPILLARY
Glucose-Capillary: 155 mg/dL — ABNORMAL HIGH (ref 70–99)
Glucose-Capillary: 121 mg/dL — ABNORMAL HIGH (ref 70–99)
Glucose-Capillary: 166 mg/dL — ABNORMAL HIGH (ref 70–99)
Glucose-Capillary: 208 mg/dL — ABNORMAL HIGH (ref 70–99)

## 2020-07-16 LAB — URINALYSIS, ROUTINE W REFLEX MICROSCOPIC
Bilirubin Urine: NEGATIVE
Glucose, UA: NEGATIVE mg/dL
Ketones, ur: NEGATIVE mg/dL
Nitrite: NEGATIVE
Protein, ur: NEGATIVE mg/dL
Specific Gravity, Urine: 1.008 (ref 1.005–1.030)
pH: 5 (ref 5.0–8.0)

## 2020-07-16 LAB — FERRITIN: Ferritin: 262 ng/mL (ref 11–307)

## 2020-07-16 LAB — FIBRINOGEN: Fibrinogen: 527 mg/dL — ABNORMAL HIGH (ref 210–475)

## 2020-07-16 LAB — RETICULOCYTES
Immature Retic Fract: 26.1 % — ABNORMAL HIGH (ref 2.3–15.9)
RBC.: 1.91 MIL/uL — ABNORMAL LOW (ref 3.87–5.11)
Retic Count, Absolute: 61.1 10*3/uL (ref 19.0–186.0)
Retic Ct Pct: 3.2 % — ABNORMAL HIGH (ref 0.4–3.1)

## 2020-07-16 LAB — EPSTEIN-BARR VIRUS VCA, IGM: EBV VCA IgM: <36 U/mL (ref 0.0–35.9)

## 2020-07-16 LAB — MITOCHONDRIAL ANTIBODIES: Mitochondrial M2 Ab, IgG: 24.4 Units — ABNORMAL HIGH (ref 0.0–20.0)

## 2020-07-16 LAB — CK: Total CK: 3921 U/L — ABNORMAL HIGH (ref 38–234)

## 2020-07-16 LAB — PREPARE RBC (CROSSMATCH)

## 2020-07-16 LAB — HEMOGLOBIN AND HEMATOCRIT, BLOOD
HCT: 20.8 % — ABNORMAL LOW (ref 36.0–46.0)
Hemoglobin: 6.4 g/dL — CL (ref 12.0–15.0)

## 2020-07-16 LAB — VITAMIN B12: Vitamin B-12: 608 pg/mL (ref 180–914)

## 2020-07-16 LAB — DIRECT ANTIGLOBULIN TEST (NOT AT ARMC)
DAT, IgG: NEGATIVE
DAT, complement: NEGATIVE

## 2020-07-16 LAB — IRON AND TIBC
Iron: 11 ug/dL — ABNORMAL LOW (ref 28–170)
Saturation Ratios: 11 % (ref 10.4–31.8)
TIBC: 104 ug/dL — ABNORMAL LOW (ref 250–450)
UIBC: 93 ug/dL

## 2020-07-16 LAB — MYOGLOBIN, SERUM: Myoglobin: 15734 ng/mL — ABNORMAL HIGH (ref 25–58)

## 2020-07-16 LAB — APTT
aPTT: 51 seconds — ABNORMAL HIGH (ref 24–36)
aPTT: 60 seconds — ABNORMAL HIGH (ref 24–36)

## 2020-07-16 LAB — MAGNESIUM
Magnesium: 1.9 mg/dL (ref 1.7–2.4)
Magnesium: 2.4 mg/dL (ref 1.7–2.4)

## 2020-07-16 LAB — HEPARIN LEVEL (UNFRACTIONATED): Heparin Unfractionated: 0.65 IU/mL (ref 0.30–0.70)

## 2020-07-16 LAB — FOLATE: Folate: 18.2 ng/mL (ref >5.9)

## 2020-07-16 LAB — ALPHA-1-ANTITRYPSIN: A-1 Antitrypsin, Ser: 154 mg/dL (ref 101–187)

## 2020-07-16 LAB — ANTI-SMOOTH MUSCLE ANTIBODY, IGG: F-Actin IgG: 10 Units (ref 0–19)

## 2020-07-16 LAB — EPSTEIN-BARR VIRUS VCA, IGG: EBV VCA IgG: 581 U/mL — ABNORMAL HIGH (ref 0.0–17.9)

## 2020-07-16 LAB — CERULOPLASMIN: Ceruloplasmin: 22.4 mg/dL (ref 19.0–39.0)

## 2020-07-16 MED ORDER — ORAL CARE MOUTH RINSE
15.0000 mL | Freq: Two times a day (BID) | OROMUCOSAL | Status: DC
  Administered 2020-07-17 – 2020-07-27 (×18): 15 mL via OROMUCOSAL

## 2020-07-16 MED ORDER — IOHEXOL 300 MG/ML  SOLN
100.0000 mL | Freq: Once | INTRAMUSCULAR | Status: AC | PRN
  Administered 2020-07-16: 100 mL via INTRAVENOUS

## 2020-07-16 MED ORDER — BISACODYL 10 MG RE SUPP
10.0000 mg | Freq: Once | RECTAL | Status: AC
  Administered 2020-07-16: 10 mg via RECTAL
  Filled 2020-07-16: qty 1

## 2020-07-16 MED ORDER — PANTOPRAZOLE SODIUM 40 MG IV SOLR
40.0000 mg | Freq: Two times a day (BID) | INTRAVENOUS | Status: DC
  Administered 2020-07-16 – 2020-07-17 (×3): 40 mg via INTRAVENOUS
  Filled 2020-07-16 (×3): qty 40

## 2020-07-16 MED ORDER — FUROSEMIDE 10 MG/ML IJ SOLN
40.0000 mg | Freq: Three times a day (TID) | INTRAMUSCULAR | Status: DC
  Administered 2020-07-16: 40 mg via INTRAVENOUS
  Filled 2020-07-16: qty 4

## 2020-07-16 MED ORDER — ALBUMIN HUMAN 25 % IV SOLN
12.5000 g | Freq: Four times a day (QID) | INTRAVENOUS | Status: AC
  Administered 2020-07-16 (×2): 12.5 g via INTRAVENOUS
  Filled 2020-07-16 (×2): qty 50

## 2020-07-16 MED ORDER — ALBUMIN HUMAN 5 % IV SOLN
12.5000 g | Freq: Once | INTRAVENOUS | Status: AC
  Administered 2020-07-16: 12.5 g via INTRAVENOUS
  Filled 2020-07-16: qty 250

## 2020-07-16 MED ORDER — SODIUM CHLORIDE 0.9% IV SOLUTION: Freq: Once | INTRAVENOUS | Status: AC

## 2020-07-16 MED ORDER — POTASSIUM CHLORIDE 10 MEQ/100ML IV SOLN
10.0000 meq | INTRAVENOUS | Status: AC
  Administered 2020-07-16 (×4): 10 meq via INTRAVENOUS
  Filled 2020-07-16 (×4): qty 100

## 2020-07-16 MED ORDER — POTASSIUM CHLORIDE CRYS ER 20 MEQ PO TBCR: 40.0000 meq | EXTENDED_RELEASE_TABLET | Freq: Four times a day (QID) | ORAL | Status: DC

## 2020-07-16 MED ORDER — ALBUMIN HUMAN 25 % IV SOLN
12.5000 g | Freq: Once | INTRAVENOUS | Status: DC
  Filled 2020-07-16: qty 50

## 2020-07-16 MED ORDER — METOPROLOL TARTRATE 5 MG/5ML IV SOLN
2.5000 mg | Freq: Four times a day (QID) | INTRAVENOUS | Status: DC
  Administered 2020-07-16: 2.5 mg via INTRAVENOUS
  Filled 2020-07-16 (×2): qty 5

## 2020-07-16 MED ORDER — METOPROLOL TARTRATE 5 MG/5ML IV SOLN
2.5000 mg | Freq: Three times a day (TID) | INTRAVENOUS | Status: DC
  Administered 2020-07-16 – 2020-07-17 (×3): 2.5 mg via INTRAVENOUS
  Filled 2020-07-16 (×3): qty 5

## 2020-07-16 MED ORDER — LORAZEPAM 2 MG/ML IJ SOLN: 1.0000 mg | Freq: Once | INTRAMUSCULAR | Status: DC

## 2020-07-16 MED ORDER — POTASSIUM CHLORIDE 10 MEQ/100ML IV SOLN
10.0000 meq | INTRAVENOUS | Status: AC
  Administered 2020-07-16 (×2): 10 meq via INTRAVENOUS
  Filled 2020-07-16 (×2): qty 100

## 2020-07-16 MED ORDER — METOPROLOL TARTRATE 5 MG/5ML IV SOLN
2.5000 mg | Freq: Three times a day (TID) | INTRAVENOUS | Status: DC
  Administered 2020-07-16: 2.5 mg via INTRAVENOUS
  Filled 2020-07-16: qty 5

## 2020-07-16 MED ORDER — CHLORHEXIDINE GLUCONATE 0.12 % MT SOLN
15.0000 mL | Freq: Two times a day (BID) | OROMUCOSAL | Status: DC
  Administered 2020-07-16 – 2020-07-28 (×25): 15 mL via OROMUCOSAL
  Filled 2020-07-16 (×21): qty 15

## 2020-07-16 MED ORDER — MAGNESIUM SULFATE 2 GM/50ML IV SOLN
2.0000 g | Freq: Once | INTRAVENOUS | Status: AC
  Administered 2020-07-16: 2 g via INTRAVENOUS
  Filled 2020-07-16: qty 50

## 2020-07-16 MED ORDER — FUROSEMIDE 10 MG/ML IJ SOLN: 40.0000 mg | Freq: Two times a day (BID) | INTRAMUSCULAR | Status: DC

## 2020-07-16 MED ORDER — FUROSEMIDE 10 MG/ML IJ SOLN
20.0000 mg | Freq: Once | INTRAMUSCULAR | Status: AC
  Administered 2020-07-17: 20 mg via INTRAVENOUS
  Filled 2020-07-16: qty 2

## 2020-07-16 NOTE — Progress Notes (Signed)
CRITICAL VALUE ALERT  Critical Value:  Potassium 2.2  Date & Time Notied:  07/16/20 0536  Provider Notified: Cyd Silence  Orders Received/Actions taken: Awaiting order

## 2020-07-16 NOTE — Progress Notes (Signed)
Pt has Bipap orders PRN.  Pt in no distress at the time.  Bipap on standby in pt's room in needed.

## 2020-07-16 NOTE — Progress Notes (Signed)
Progress Note    ASSESSMENT AND PLAN:    CHF/fluid overload, being diuresed.  Severe oropharyngeal dysphagia d/tmyopathy/myositis(being managed by neurology with steroids).Expect to get better gradually. Neg EGD10/05/2020 foreso stricture.  FOBT+. No active bleeding. Hb has improved on steroids.  EGD 06/17/2020-clean-basedDU, HPgastritiss/p Rx.Neg Colon10/05/2020 except fordiv, polyp s/ppolypectomy. Neg CTAPwith contrast.  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  Liver Nl onCT AP.Neg [place] with doppler.No intrinsic liver disease. Shehas been onMTX. Acute hep panel neg, Covid-19 neg, iron sat Nl  Abdominal pain-resolved.  She does have some constipation.  Rhabdo/myositis-autoimmunevsinflammatory on IV steroids. Followed closely by neurology.  Associatedadvanced age,CKD,[name], RA, HTN, [name], DM, CAD, dCHF. DVT on heparin.  Plan: -Recommend speech evaluation.  If unable to swallow, recommend cortrack feeding tube for nutrition needs.  -Trend CBC,LFTs. -Continue Protonix suspension 40BID. -Scope only if any active bleeding or significant drop inHb.  -Not a candidate for liver bx. No new GI recommendations. -Will sign off for now.  Please call if we could be of any further help. -Overall guarded prognosis.    SUBJECTIVE   Had shortness of breath with fluid overload. Was given liquid but she coughed up Getting speech evaluation. No fever or chills. And abdominal pain which is better now.    OBJECTIVE:     Vital signs in last 24 hours: Temp:  [97.6 F (36.4 C)-98.2 F (36.8 C)] 98 F (36.7 C) (11/07 0430) Pulse Rate:  [67-90] 85 (11/07 0430) Resp:  [15-30] 20 (11/07 0430) BP: (56-142)/(35-77) 133/53 (11/07 0430) SpO2:  [86 %-100 %] 99 % (11/07 0430) Weight:  [70 kg] 70 kg (11/07 0430) Last BM Date: 07/13/20 General:   Alert, well-developed female in NAD EENT:  Normal hearing, non icteric sclera,  conjunctive pink.  Heart:  Regular rate and rhythm; no murmur.  No lower extremity edema   Pulm: Bilateral decreased breath sounds. Abdomen:  Soft, nondistended, nontender.  Normal bowel sounds,.       Psych:  Pleasant, cooperative.  Normal mood and affect.   Intake/Output from previous day: 11/06 0701 - 11/07 0700 In: -  Out: 1950 [Urine:1950] Intake/Output this shift: No intake/output data recorded.  Lab Results: Recent Labs    07/14/20 0342 07/15/20 0529 07/16/20 0438  WBC 32.0* 32.2* 30.1*  HGB 8.3* 9.5* 7.7*  HCT 25.6* 28.6* 24.5*  PLT 218 206 187   BMET Recent Labs    07/14/20 0342 07/15/20 0529 07/16/20 0438  NA 140 142 148*  K 4.4 4.5 2.2*  CL 107 109 106  CO2 19* 22 28  GLUCOSE 189* 200* 147*  BUN 33* 44* 39*  CREATININE 0.64 0.62 0.64  CALCIUM 8.0* 8.0* 8.3*   LFT Recent Labs    07/16/20 0438  PROT 4.7*  ALBUMIN 2.6*  AST 193*  ALT 422*  ALKPHOS 54  BILITOT 1.2   PT/INR Recent Labs    07/14/20 0342 07/15/20 0529  LABPROT 16.4* 15.5*  INR 1.4* 1.3*   Hepatitis Panel No results for input(s): HEPBSAG, HCVAB, HEPAIGM, HEPBIGM in the last 72 hours.  DG CHEST PORT 1 VIEW  Result Date: 07/15/2020 CLINICAL DATA:  Short of breath. EXAM: PORTABLE CHEST 1 VIEW COMPARISON:  07/13/2020 FINDINGS: Previous median sternotomy and CABG procedure. Aortic atherosclerotic calcifications. Mitral annular calcifications. Bilateral pleural effusions are noted, right greater than left. Pulmonary vascular congestion and trace edema. No airspace densities. IMPRESSION: 1. Mild congestive heart failure. 2.  Aortic Atherosclerosis (ICD10-I70.0). Electronically Signed   By: Kerby Moors  M.D.   On: 07/15/2020 11:19   [place] EKG SITE RITE  Result Date: 07/15/2020 If Site Rite image not attached, placement could not be confirmed due to current cardiac rhythm.    Principal Problem:   Rhabdomyolysis Active Problems:   Essential hypertension   Mixed hyperlipidemia    Peripheral vascular disease (HCC)   CAD (coronary artery disease) of artery bypass graft   Rheumatoid arthritis (HCC)   Chronic diastolic CHF (congestive heart failure) ([place])   Type 2 diabetes mellitus with diabetic polyneuropathy, with long-term current use of insulin ([place])   Myositis   Constipation   LFT elevation     LOS: 5 days     Carmell Austria, MD 07/16/2020, 11:07 AM [facility] [phone]

## 2020-07-16 NOTE — Progress Notes (Signed)
ANTICOAGULATION CONSULT NOTE   Pharmacy Consult for Heparin Indication: Recent  DVT  No Known Allergies  Patient Measurements: Height: 5\' 6"  (167.6 cm) Weight: 70 kg (154 lb 5.2 oz) IBW/kg (Calculated) : 59.3 Heparin Dosing Weight: 66.2 kg  Vital Signs: Temp: 98 F (36.7 C) (11/07 0430) Temp Source: Oral (11/07 0430) BP: 133/53 (11/07 0430) Pulse Rate: 85 (11/07 0430)  Labs: Recent Labs    07/14/20 0342 07/14/20 9373 07/15/20 0529 07/15/20 0529 07/15/20 1101 07/15/20 1635 07/16/20 0438 07/16/20 0902 07/16/20 1207  HGB 8.3*   < > 9.5*  --   --   --  7.7*  --   --   HCT 25.6*  --  28.6*  --   --   --  24.5*  --   --   PLT 218  --  206  --   --   --  187  --   --   APTT 153*   < > 141*   < >  --  72*  --  51* 60*  LABPROT 16.4*  --  15.5*  --   --   --   --   --   --   INR 1.4*  --  1.3*  --   --   --   --   --   --   HEPARINUNFRC 2.08*   < > 1.88*  --   --  1.16*  --   --  0.65  CREATININE 0.64  --  0.62  --   --   --  0.64  --   --   CKTOTAL 42,876*  --   --   --  81,157*  --  3,921*  --   --   TROPONINIHS  --   --   --   --  72* 74*  --   --   --    < > = values in this interval not displayed.    Estimated Creatinine Clearance: 49.9 mL/min (by C-G formula based on SCr of 0.64 mg/dL).   Medical History: Past Medical History:  Diagnosis Date  . Anemia   . Anemia 06/14/2020  . Anxiety   . Arthritis   . Blood transfusion   . Chest pain    a. Lexiscan Myoview 12/12:  EF 75%, no ischemia or scar;  b.  Echo 12/12:  EF 60-65%, no sig valvular abnormalities   . Closed left fibular fracture    Casted   . Complication of anesthesia 1998   Unable to arouse fully, respiratory depression, pt. reports that the family was told that she would need a trach., but then she stabilized & didn't need it   . Coronary artery disease    sees Dr Aundra Dubin every 6 months  . Depression   . Diabetes mellitus    Greater than 20 years type 2   . GERD (gastroesophageal reflux disease)    . H/O hiatal hernia   . Hypercholesteremia   . Hypertension    Greater than 20 years  . Lumbar disc disease    S/p surgery x4;  Sciatica   . Neuropathy due to secondary diabetes ([place])    Hx: of  . Parastomal hernia of ileal conduit 2009  . Pneumonia    s/p CABG  . [name] (peripheral vascular disease) (HCC)    Stable claudication. Arteriogram 07/2009 no significant aortoiliac disease, has bilateral SFA occlusions with distal reconstitution   . Rheumatoid arthritis ([place])    Dr Levada Dy  [place] @ [facility]  . Transfusion reaction 1960   Convulsions (? seizure)  . UTI (lower urinary tract infection) 01/03/15    Medications:  Scheduled:  . chlorhexidine  15 mL Mouth Rinse BID  . Chlorhexidine Gluconate Cloth  6 each Topical Daily  . furosemide  40 mg Intravenous Q8H  . gabapentin  100 mg Oral BID  . gabapentin  300 mg Oral QHS  . liver oil-zinc oxide  1 application Topical TID  . magic mouthwash  5 mL Oral QID  . mouth rinse  15 mL Mouth Rinse q12n4p  . metoprolol tartrate  2.5 mg Intravenous Q6H  . pantoprazole (PROTONIX) IV  40 mg Intravenous Q12H  . sodium chloride flush  10-40 mL Intracatheter Q12H    Assessment: 85 y.o.femalewith medical history significant ofRA; [name]; DM; HTN; [name]; and CAD s/p CABG presenting with leg pain. Patient with possible myositis. Started on apixaban recently within last few weeks for acute left leg DVT. Patient started on heparin due to possible biopsy/procedure. Was held on 11/4 due to concerns for melena and resumed 11/5.   APTT slightly subtherapeutic this afternoon at 60 sec on drip rate 600 units/hr. Heparin level remains falsely elevated. Will dose based on aPTT until levels correlate. CBC down 9.5>7.7. Plts wnl. No overt bleeding or infusion issues noted.  Goal of Therapy:  Heparin level 0.3-0.7 APTT 66-102 sec Monitor platelets by anticoagulation protocol: Yes   Plan:  Increase IV heparin to 650 units/hr Check aPTT and heparin  level with AM labs, then daily F/u plans to eventually resume apixaban.  Richardine Service, PharmD PGY2 Cardiology Pharmacy Resident Phone: [phone] 07/16/2020  2:39 PM  Please check [email or web address] for unit-specific pharmacy phone numbers.

## 2020-07-16 NOTE — Progress Notes (Signed)
   07/16/20 1600  Assess: MEWS Score  Temp 98.8 F (37.1 C)  BP (!) 90/57  Pulse Rate (!) 139  ECG Heart Rate (!) 141  Resp 16  Level of Consciousness Alert  SpO2 100 %  O2 Device Nasal Cannula  O2 Flow Rate (L/min) 3 L/min  Assess: MEWS Score  MEWS Temp 0  MEWS Systolic 1  MEWS Pulse 3  MEWS RR 0  MEWS LOC 0  MEWS Score 4  MEWS Score Color Red  Assess: if the MEWS score is Yellow or Red  Were vital signs taken at a resting state? Yes  Focused Assessment No change from prior assessment  Treat  Pain Score 1  Notify: Provider  Provider Name/Title Patel  Date Provider Notified 07/16/20  Notification Type Call  Notification Reason Other (Comment) (tachycardia)  Response See new orders

## 2020-07-16 NOTE — Progress Notes (Addendum)
[facility] Progress Note  Patient: Patricia Villegas    VWU:[id number]  DOA: 08/03/2020     Date of Service: the patient was seen and examined on 07/16/2020  Brief hospital course: Past medical history of RA; [name]; DM; HTN; [name]; and CAD s/p CABG presenting with leg pain. Found to have severe rhabdomyolysis along with elevated LFT. Neurology was consulted. GI was consulted for melena history.  Continues to have shortness of breath and now transferred to progressive care unit on BiPAP. Currently plan is continue to support respiratory status, aggressive diuresis, discussed about nutrition option tomorrow and monitor improvement in generalized weakness.  Assessment and Plan: Addendum: Hb low to 6.7.  Stop heparin.  2PRBC now Stop albumin to avoid volume overload Ct abdominal pelvis to look for intraabdominal bleed since no evidence of bleeding externally.  Maintain NPO May need lasix between PRBC.    Berle Mull 6:52 PM 07/16/2020    1. Nontraumatic rhabdomyolysis Polymyositis Generalized weakness of both upper and lower extremity primarily proximal along with pain in lower extremity. CK continues to trend up despite IV LR.  Patient appears to be volume overloading. Patient was on Zocor currently on hold. Hepatic function panel also shows worsening LFT likely secondary to myositis. Neurology was consulted. Currently onhigh-dose steroids with 1 g IV Solu-Medrol. On chronic prednisone secondary to history of rheumatoid arthritis.  CK trending down with steroids. Still has generalized weakness fatigue and tiredness.  Explained to the patient and the family that this will take a long time to recover.  2. Recent DVT Diagnosed in October 2021. Started on Eliquis. Currently reports melena. GI was consulted. Currently no plan for intervention. Toleratingheparin and monitor.  3. Acute on chronic anemia with melena Acute on chronic blood loss anemia. Recently hospitalized  for anemia underwent EGD found to have duodenal ulcer. Continuingtwice dailyPPI. Discontinue Eliquis for now. Hemoglobin spuriously low on 07/16/2020.  Suspect lab error.  We will recheck it. If continues to have drop in hemoglobin will require transfusion for hemoglobin less than 7.  4. Urinary retention Monitor for now.Reoccurred. Foley catheter placed Neuro recommended to follow-up on urine culture as well as urine analysis.  5.Acute on chronicdiastolic CHF Essential hypertension CAD SP CABG Acute hypoxic respiratory failure secondary to CHF Appears volume overloaded. IV albumin every 6 hours followed by IV Lasix. Daily weight and strict I's and O's.  6. Rheumatoid arthritis Holding Plaquenil. Neuro prefers to start methotrexate again.  Currently there was concern about the possibility of UTI and therefore initiation of methotrexate is on hold.  7.Dysphagia. Secondary to generalized weakness and fatigue but other than focal deficit. Underwent speech therapy evaluation with modified barium swallow. Speech recommends cervical collar to support the head to minimize aspiration and also dysphagia 2 diet as she has weakness with mastication. Repeat evaluation on 07/16/2020 shows that the patient reports significant weakness and inability to swallow safely and therefore currently remains n.p.o. If there is no improvement in patient's ability to swallow we will require corporate placement tomorrow.  Diet: N.p.o. for now DVT Prophylaxis:   Place and maintain sequential compression device Start: 07/14/20 1122   Advance goals of care discussion: DNR  Family Communication: family was present at bedside, at the time of interview.  The pt provided permission to discuss medical plan with the family. Opportunity was given to ask question and all questions were answered satisfactorily.   Disposition:  Status is: Inpatient  Remains inpatient appropriate because:Ongoing  diagnostic testing needed not appropriate for  outpatient work up, IV treatments appropriate due to intensity of illness or inability to take PO and Inpatient level of care appropriate due to severity of illness   Dispo: The patient is from: Home              Anticipated d/c is to: SNF              Anticipated d/c date is: > 3 days              Patient currently is not medically stable to d/c.  Subjective: No nausea no vomiting.  Able to come off of the BiPAP.  Still has cough and shortness of breath.  Voice is still muffled.  Still feeling significantly weak and lethargic.  Had abdominal pain and distention this morning which resolved after Foley catheter placement.  Reports constipation.  Physical Exam:  General: Appear in moderate distress, no Rash; Oral Mucosa Clear, dry. no Abnormal Neck Mass Or lumps, Conjunctiva normal  Cardiovascular: S1 and S2 Present, no Murmur, Respiratory: increased respiratory effort, Bilateral Air entry present and bilateral  Crackles, no wheezes Abdomen: Bowel Sound present, Soft and no tenderness Extremities: bilateral  Pedal edema Neurology: alert and oriented to time, place, and person affect appropriate. no new focal deficit Gait not checked due to patient safety concerns  Vitals:   07/15/20 2337 07/16/20 0000 07/16/20 0300 07/16/20 0430  BP: (!) 133/54 (!) 135/56 (!) 127/50 (!) 133/53  Pulse: 81 81 81 85  Resp: 15 16 17 20   Temp:  97.9 F (36.6 C)  98 F (36.7 C)  TempSrc:  Axillary  Oral  SpO2: 100% 100% 100% 99%  Weight:    70 kg  Height:        Intake/Output Summary (Last 24 hours) at 07/16/2020 1628 Last data filed at 07/16/2020 0300 Gross per 24 hour  Intake --  Output 1950 ml  Net -1950 ml   Filed Weights   07/12/2020 0031 07/16/20 0430  Weight: 66.2 kg 70 kg    Data Reviewed: I have personally reviewed and interpreted daily labs, tele strips, imagings as discussed above. I reviewed all nursing notes, pharmacy notes, vitals,  pertinent old records I have discussed plan of care as described above with RN and patient/family.  CBC: Recent Labs  Lab 07/13/20 0437 07/13/20 1651 07/14/20 0342 07/15/20 0529 07/16/20 0438  WBC 34.2* 28.9* 32.0* 32.2* 30.1*  HGB 8.2* 9.4* 8.3* 9.5* 7.7*  HCT 25.5* 28.7* 25.6* 28.6* 24.5*  MCV 103.7* 102.9* 105.8* 105.1* 106.1*  PLT 215 207 218 206 628   Basic Metabolic Panel: Recent Labs  Lab 07/30/2020 0111 07/10/2020 0459 07/13/20 0437 07/13/20 1651 07/14/20 0342 07/15/20 0529 07/16/20 0438 07/16/20 0902  NA 141   < > 138 138 140 142 148*  --   K 4.0   < > 4.2 4.4 4.4 4.5 2.2*  --   CL 106   < > 107 106 107 109 106  --   CO2 26   < > 23 23 19* 22 28  --   GLUCOSE 106*   < > 171* 203* 189* 200* 147*  --   BUN 23   < > 25* 27* 33* 44* 39*  --   CREATININE 0.65   < > 0.67 0.68 0.64 0.62 0.64  --   CALCIUM 8.4*   < > 8.0* 8.0* 8.0* 8.0* 8.3*  --   MG 2.2  --   --   --   --   --   --  1.9   < > = values in this interval not displayed.    Studies: No results found.  Scheduled Meds: . chlorhexidine  15 mL Mouth Rinse BID  . Chlorhexidine Gluconate Cloth  6 each Topical Daily  . furosemide  40 mg Intravenous Q8H  . gabapentin  100 mg Oral BID  . gabapentin  300 mg Oral QHS  . liver oil-zinc oxide  1 application Topical TID  . magic mouthwash  5 mL Oral QID  . mouth rinse  15 mL Mouth Rinse q12n4p  . metoprolol tartrate  2.5 mg Intravenous Q6H  . pantoprazole (PROTONIX) IV  40 mg Intravenous Q12H  . potassium chloride  40 mEq Oral Q6H  . sodium chloride flush  10-40 mL Intracatheter Q12H   Continuous Infusions: . heparin 650 Units/hr (07/16/20 1545)  . magnesium sulfate bolus IVPB 2 g (07/16/20 1600)  . methylPREDNISolone (SOLU-MEDROL) injection 1,000 mg (07/16/20 1043)  . potassium chloride     PRN Meds: acetaminophen **OR** acetaminophen, albuterol, hydrALAZINE, morphine injection, ondansetron **OR** ondansetron (ZOFRAN) IV, phenol, sodium chloride  flush  Time spent: 35 minutes  Author: Berle Mull, MD [facility] 07/16/2020 4:28 PM  To reach On-call, see care teams to locate the attending and reach out via [email or web address]. Between 7PM-7AM, please contact night-coverage If you still have difficulty reaching the attending provider, please page the [facility] (Director on Call) for [facility] on amion for assistance.

## 2020-07-16 NOTE — Significant Event (Signed)
Rapid Response Event Note   Reason for Call :  On unit rounding  Initial Focused Assessment:  I was rounding on the unit and wanted to check on this patient due her respiratory issues yesterday.  When I walked into the room the patient was moaning and groaning stating that her "pee pee hurts".  She also had generalized pain.  Patient had complaints of dysuria and urinary frequency.  Patient's abdomen was very distended.   Neuro MD was also rounding on the patient and voiced her concerns because the patient was immunosuppressed.    BP 137/70 ECG 102 RR 13 O2 100%    Interventions:  Catheter placed, vitals taken, UA collected and sent to lab, soap suds enema ordered for constipation  Plan of Care:  Patient will remain on 2C and I will round on her later   Event Summary:   MD Notified: Neuro MD at bedside and Dr. Posey Pronto notified Call Time: 0800 Arrival Time: 0800 End Time: 0845  Venetia Maxon, RN

## 2020-07-16 NOTE — Progress Notes (Addendum)
Neurology Progress Note  Patient ID: Patricia Villegas is a 85 y.o. with JATORIA KNEELAND is a 85 y.o. female with history of [name], DVT on Eliquis, rheumatoid arthritis, hypertension, hypercholesterolemia, CAD, recent right total hip arthroplasty, recent GI bleed secondary to H. Pylori infection, anemia, diabetes, coronary artery disease status post CABG.   Initially consulted for: c/f myositis given elevated CK  Major interval events/subjective:  -Acute abdominal pain this morning -Has not been having good bowel movements -Diffuse pain everywhere -Denies sweating or feeling poorly but axillary temp 99.6 F   Exam: Vitals:   07/16/20 0300 07/16/20 0430  BP: (!) 127/50 (!) 133/53  Pulse: 81 85  Resp: 17 20  Temp:  98 F (36.7 C)  SpO2: 100% 99%   Gen: In bed, distressed Affect: Anxious Resp: Tachypneic but no longer wheezing Cardiac: Perfusing extremities well.  Abd: Distended, lower bladder somewhat firm Extremities: scattered ecchymosis.  Somewhat improving edema  Neuro: MS: Alert awake and oriented to person, place, time, situation CN: Face symmetric, EOMI intact to tracking examiner Motor: Proximally 1/5 in the arms, distally 4/5. In the legs proximally 1/5, distally 4/5   Pertinent Labs: CK stabilizing/falling peak 31,948 11/3 at 17:00, currently 3,921 Myoglobin improving from > 30K to 15,734 WBC stable in the low 30s Platelets, Hgb stable  -Aldolase 77 (ref range 3.3-10.3), LDH 730 (ref range 98-192), myoglobin 16,784 (ref range 25-58) AST and ALT were markedly elevated, AST> ALT, GGT normal at 17  Inflammatory workup: -Follow-up free T4 (TSH 6.4) -ESR 69  -ANA -Anti-Hmg-CoA antibody pending -Myositis panel sent out to [facility] pending -AChR antibody pending  CT chest and CT abdomen pelvis negative for malignancy  Impression: 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  Unfortunately the patient is experiencing significant abdominal  pain likely secondary to UTI and/or constipation. Suspect she feels weaker with decompensating swallowing today due to pain, already improving with placement of foley. Discussed potential need for temporary need for NGT or even possibly PEG if patient takes time to recovery; recovery time may be prolonged given her age.   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  At this time we are in agreement to that risks of stopping Protonix outweigh the benefit, given known GI bleed and likely alternative explanation for myositis (autoimmune).  However should the patient fail to respond to immune modulating treatment, the potential role of Protonix can be reconsidered  # Abdominal pain and distension, possibly secondary to UTI/constipation - Foley - Soap suds enema - Urine analysis and culture - Antibiotics per primary team  # Shortness of breath, improving -AChR antibody pending as above  # Dry mouth - chloraseptic mouth spray  Lesleigh Noe MD-PhD [facility] [phone]

## 2020-07-16 NOTE — Evaluation (Signed)
Clinical/Bedside Swallow Evaluation Patient Details  Name: Patricia Villegas MRN: [id number] Date of Birth: [birth year]  Today's Date: 07/16/2020 Time: SLP Start Time (ACUTE ONLY): 1215 SLP Stop Time (ACUTE ONLY): 1235 SLP Time Calculation (min) (ACUTE ONLY): 20 min  Past Medical History:  Past Medical History:  Diagnosis Date  . Anemia   . Anemia 06/14/2020  . Anxiety   . Arthritis   . Blood transfusion   . Chest pain    a. Lexiscan Myoview 12/12:  EF 75%, no ischemia or scar;  b.  Echo 12/12:  EF 60-65%, no sig valvular abnormalities   . Closed left fibular fracture    Casted   . Complication of anesthesia 1998   Unable to arouse fully, respiratory depression, pt. reports that the family was told that she would need a trach., but then she stabilized & didn't need it   . Coronary artery disease    sees Dr Aundra Dubin every 6 months  . Depression   . Diabetes mellitus    Greater than 20 years type 2   . GERD (gastroesophageal reflux disease)   . H/O hiatal hernia   . Hypercholesteremia   . Hypertension    Greater than 20 years  . Lumbar disc disease    S/p surgery x4;  Sciatica   . Neuropathy due to secondary diabetes ([place])    Hx: of  . Parastomal hernia of ileal conduit 2009  . Pneumonia    s/p CABG  . [name] (peripheral vascular disease) ([facility])    Stable claudication. Arteriogram 07/2009 no significant aortoiliac disease, has bilateral SFA occlusions with distal reconstitution   . Rheumatoid arthritis ([employer])    Dr Berna Bue @ [facility]  . Transfusion reaction 1960   Convulsions (? seizure)  . UTI (lower urinary tract infection) 01/03/15   Past Surgical History:  Past Surgical History:  Procedure Laterality Date  . ABDOMINAL HYSTERECTOMY    . BACK SURGERY     4 back operations most recently 1995, 1998  . BIOPSY  06/17/2020   Procedure: BIOPSY;  Surgeon: Yetta Flock, MD;  Location: [facility] ENDOSCOPY;  Service: Gastroenterology;;  . CARDIAC CATHETERIZATION      Hx: of 05/14/13  . CATARACT EXTRACTION W/ INTRAOCULAR LENS  IMPLANT, BILATERAL     Hx: of  . COLONOSCOPY     Hx; of  . COLONOSCOPY WITH PROPOFOL N/A 06/17/2020   Procedure: COLONOSCOPY WITH PROPOFOL;  Surgeon: Yetta Flock, MD;  Location: [facility] ENDOSCOPY;  Service: Gastroenterology;  Laterality: N/A;  . CORONARY ARTERY BYPASS GRAFT N/A 05/24/2013   Procedure: CORONARY ARTERY BYPASS GRAFTING (CABG);  Surgeon: Melrose Nakayama, MD;  Location: [place];  Service: Open Heart Surgery;  Laterality: N/A;  . DILATION AND CURETTAGE OF UTERUS    . ESOPHAGOGASTRODUODENOSCOPY (EGD) WITH PROPOFOL N/A 06/17/2020   Procedure: ESOPHAGOGASTRODUODENOSCOPY (EGD) WITH PROPOFOL;  Surgeon: Yetta Flock, MD;  Location: [place];  Service: Gastroenterology;  Laterality: N/A;  . HEMOSTASIS CLIP PLACEMENT  06/17/2020   Procedure: HEMOSTASIS CLIP PLACEMENT;  Surgeon: Yetta Flock, MD;  Location: [place] ENDOSCOPY;  Service: Gastroenterology;;  . HERNIA REPAIR  2009  . LEFT HEART CATHETERIZATION WITH CORONARY ANGIOGRAM N/A 05/14/2013   Procedure: LEFT HEART CATHETERIZATION WITH CORONARY ANGIOGRAM;  Surgeon: Larey Dresser, MD;  Location: [facility] CATH LAB;  Service: Cardiovascular;  Laterality: N/A;  . POLYPECTOMY  06/17/2020   Procedure: POLYPECTOMY;  Surgeon: Yetta Flock, MD;  Location: [facility] ENDOSCOPY;  Service: Gastroenterology;;  . TONSILLECTOMY    .  TOTAL HIP ARTHROPLASTY Left 01/10/2015   Procedure: TOTAL HIP ARTHROPLASTY ANTERIOR APPROACH;  Surgeon: Melrose Nakayama, MD;  Location: [place];  Service: Orthopedics;  Laterality: Left;  . TOTAL HIP ARTHROPLASTY Right 05/30/2020   Procedure: RIGHT TOTAL HIP ARTHROPLASTY ANTERIOR APPROACH;  Surgeon: Melrose Nakayama, MD;  Location: [facility] ORS;  Service: Orthopedics;  Laterality: Right;   HPI:  Pt is an 85 y.o. female with medical history significant of RA, [name], DM, HTN, [name], and CAD s/p CABG who presented with leg pain.  She had physical therapy sessions and became  very weak came back to the hospital was found to have a bleeding ulcer and a DVT. Per neurology's note, pt has had an insidious slow progressive onset of weakness for 3-4 weeks concerning for myopathy. SLP was consulted since dysphagia may be related to myopathy.  In evening of 11/5 Rapid Response was called due to patient c/o SOB and increased WOB but no acute interventions were required as patient's oxygen saturation was 94% on 2L nasal cannula and RR was 18-20. Rapid Response called again on 11/6 due to same reasons and at this time, patient was on 6L oxygen via nasal cannula which was then increased to 8L. She was transferred to higher level of care on 2C. ST ordered for repeat BSE due to these changes.   Assessment / Plan / Recommendation Clinical Impression  Patient presents with what appears to be an overall decline in her swallow function. She exhibited a very weak cough, though was able to expectorate thick phlegm one time after some sips of water. Her swallow function appeared weak, with patient performing multiple attempts to swallow (laryngeal pumping) and putting forth a lot of effort when swallowing small sips of water. She did not exhibit any coughing or throat clearing. SLP concerned that patient currently too weak to manage PO diet, as well as her decline in respiratory status with increased WOB and SOB and Rapid Response called on previous two dates to address this. Current recommendation from SLP is to remain NPO but allow for ice chips/water sips after oral care and allow for meds crushed in puree. SLP to check with patient next date as MD is planning to place Cortrak if patient unable to safely consume PO's. SLP Visit Diagnosis: Dysphagia, unspecified (R13.10)    Aspiration Risk  Moderate aspiration risk;Risk for inadequate nutrition/hydration    Diet Recommendation NPO except meds;Ice chips PRN after oral care;NPO   Liquid Administration via: Cup;Straw Medication Administration:  Crushed with puree Postural Changes: Seated upright at 90 degrees    Other  Recommendations Oral Care Recommendations: Oral care BID;Staff/trained caregiver to provide oral care   Follow up Recommendations Other (comment) (TBD)      Frequency and Duration min 2x/week  2 weeks       Prognosis Prognosis for Safe Diet Advancement: Fair Barriers to Reach Goals: Severity of deficits      Swallow Study   General Date of Onset: 07/16/20 HPI: Pt is an 85 y.o. female with medical history significant of RA, [name], DM, HTN, [name], and CAD s/p CABG who presented with leg pain.  She had physical therapy sessions and became very weak came back to the hospital was found to have a bleeding ulcer and a DVT. Per neurology's note, pt has had an insidious slow progressive onset of weakness for 3-4 weeks concerning for myopathy. SLP was consulted since dysphagia may be related to myopathy.  In evening of 11/5 Rapid Response was called due to  patient c/o SOB and increased WOB but no acute interventions were required as patient's oxygen saturation was 94% on 2L nasal cannula and RR was 18-20. Rapid Response called again on 11/6 due to same reasons and at this time, patient was on 6L oxygen via nasal cannula which was then increased to 8L. She was transferred to higher level of care on 2C. ST ordered for repeat BSE due to these changes. Type of Study: Bedside Swallow Evaluation Previous Swallow Assessment: BSE, MBS during this acute stay Diet Prior to this Study: NPO Temperature Spikes Noted: No Respiratory Status: Nasal cannula History of Recent Intubation: No Behavior/Cognition: Alert;Cooperative;Pleasant mood Oral Cavity Assessment: Within Functional Limits Oral Care Completed by SLP: Yes Oral Cavity - Dentition: Adequate natural dentition;Missing dentition Vision: Functional for self-feeding Self-Feeding Abilities: Needs set up;Needs assist Patient Positioning: Upright in bed Baseline Vocal Quality: Low  vocal intensity Volitional Cough: Weak Volitional Swallow: Able to elicit    Oral/Motor/Sensory Function Overall Oral Motor/Sensory Function: Within functional limits   Ice Chips     Thin Liquid Thin Liquid: Impaired Presentation: Straw Pharyngeal  Phase Impairments: Other (comments);Multiple swallows Other Comments: Patient demonstrated difficulty with swallows of water, and observed to put forth a lot of effort for each swallow. She did cough up and expectorate thick yellow-brownish phlegm and per RN, she has been doing that frequently    Nectar Thick     Honey Thick     Puree Puree: Not tested   Solid     Solid: Not tested     Sonia Baller, MA, CCC-SLP Speech Therapy [facility]

## 2020-07-17 DIAGNOSIS — R7989 Other specified abnormal findings of blood chemistry: Secondary | ICD-10-CM | POA: Diagnosis not present

## 2020-07-17 LAB — MYOGLOBIN, SERUM: Myoglobin: 7711 ng/mL — ABNORMAL HIGH (ref 25–58)

## 2020-07-17 LAB — COMPREHENSIVE METABOLIC PANEL
ALT: 294 U/L — ABNORMAL HIGH (ref 0–44)
AST: 94 U/L — ABNORMAL HIGH (ref 15–41)
Albumin: 2.8 g/dL — ABNORMAL LOW (ref 3.5–5.0)
Alkaline Phosphatase: 48 U/L (ref 38–126)
Anion gap: 15 (ref 5–15)
BUN: 41 mg/dL — ABNORMAL HIGH (ref 8–23)
CO2: 27 mmol/L (ref 22–32)
Calcium: 8.5 mg/dL — ABNORMAL LOW (ref 8.9–10.3)
Chloride: 110 mmol/L (ref 98–111)
Creatinine, Ser: 0.64 mg/dL (ref 0.44–1.00)
GFR, Estimated: >60 mL/min (ref >60)
Glucose, Bld: 250 mg/dL — ABNORMAL HIGH (ref 70–99)
Potassium: 2.6 mmol/L — CL (ref 3.5–5.1)
Sodium: 152 mmol/L — ABNORMAL HIGH (ref 135–145)
Total Bilirubin: 1.9 mg/dL — ABNORMAL HIGH (ref 0.3–1.2)
Total Protein: 4.7 g/dL — ABNORMAL LOW (ref 6.5–8.1)

## 2020-07-17 LAB — CBC
HCT: 29.1 % — ABNORMAL LOW (ref 36.0–46.0)
HCT: 29.6 % — ABNORMAL LOW (ref 36.0–46.0)
Hemoglobin: 9.3 g/dL — ABNORMAL LOW (ref 12.0–15.0)
Hemoglobin: 9.5 g/dL — ABNORMAL LOW (ref 12.0–15.0)
MCH: 31.2 pg (ref 26.0–34.0)
MCH: 31.6 pg (ref 26.0–34.0)
MCHC: 32 g/dL (ref 30.0–36.0)
MCHC: 32.1 g/dL (ref 30.0–36.0)
MCV: 97.7 fL (ref 80.0–100.0)
MCV: 98.3 fL (ref 80.0–100.0)
Platelets: 128 10*3/uL — ABNORMAL LOW (ref 150–400)
Platelets: 131 10*3/uL — ABNORMAL LOW (ref 150–400)
RBC: 2.98 MIL/uL — ABNORMAL LOW (ref 3.87–5.11)
RBC: 3.01 MIL/uL — ABNORMAL LOW (ref 3.87–5.11)
RDW: 22.7 % — ABNORMAL HIGH (ref 11.5–15.5)
RDW: 23.2 % — ABNORMAL HIGH (ref 11.5–15.5)
WBC: 21.8 10*3/uL — ABNORMAL HIGH (ref 4.0–10.5)
WBC: 21.1 10*3/uL — ABNORMAL HIGH (ref 4.0–10.5)
nRBC: 0.3 % — ABNORMAL HIGH (ref 0.0–0.2)
nRBC: 0.5 % — ABNORMAL HIGH (ref 0.0–0.2)

## 2020-07-17 LAB — BASIC METABOLIC PANEL
Anion gap: 13 (ref 5–15)
BUN: 43 mg/dL — ABNORMAL HIGH (ref 8–23)
CO2: 26 mmol/L (ref 22–32)
Calcium: 8.2 mg/dL — ABNORMAL LOW (ref 8.9–10.3)
Chloride: 110 mmol/L (ref 98–111)
Creatinine, Ser: 0.65 mg/dL (ref 0.44–1.00)
GFR, Estimated: >60 mL/min (ref >60)
Glucose, Bld: 321 mg/dL — ABNORMAL HIGH (ref 70–99)
Potassium: 3.2 mmol/L — ABNORMAL LOW (ref 3.5–5.1)
Sodium: 149 mmol/L — ABNORMAL HIGH (ref 135–145)

## 2020-07-17 LAB — GLUCOSE, CAPILLARY
Glucose-Capillary: 228 mg/dL — ABNORMAL HIGH (ref 70–99)
Glucose-Capillary: 219 mg/dL — ABNORMAL HIGH (ref 70–99)
Glucose-Capillary: 229 mg/dL — ABNORMAL HIGH (ref 70–99)
Glucose-Capillary: 337 mg/dL — ABNORMAL HIGH (ref 70–99)

## 2020-07-17 LAB — ACETYLCHOLINE RECEPTOR, BINDING: Acety choline binding ab: <0.03 nmol/L (ref 0.00–0.24)

## 2020-07-17 LAB — PREALBUMIN
Prealbumin: 8.2 mg/dL — ABNORMAL LOW (ref 18–38)
Prealbumin: 8.3 mg/dL — ABNORMAL LOW (ref 18–38)

## 2020-07-17 LAB — URINE CULTURE

## 2020-07-17 LAB — APTT
aPTT: 23 seconds — ABNORMAL LOW (ref 24–36)
aPTT: 22 seconds — ABNORMAL LOW (ref 24–36)

## 2020-07-17 LAB — ANA W/REFLEX IF POSITIVE: Anti Nuclear Antibody (ANA): NEGATIVE

## 2020-07-17 LAB — CK: Total CK: 2316 U/L — ABNORMAL HIGH (ref 38–234)

## 2020-07-17 LAB — HEPARIN LEVEL (UNFRACTIONATED)
Heparin Unfractionated: 0.53 IU/mL (ref 0.30–0.70)
Heparin Unfractionated: 0.48 IU/mL (ref 0.30–0.70)

## 2020-07-17 LAB — MAGNESIUM: Magnesium: 2.1 mg/dL (ref 1.7–2.4)

## 2020-07-17 LAB — PHOSPHORUS: Phosphorus: 3.9 mg/dL (ref 2.5–4.6)

## 2020-07-17 MED ORDER — METHYLPREDNISOLONE SODIUM SUCC 125 MG IJ SOLR: 60.0000 mg | Freq: Every day | INTRAMUSCULAR | Status: DC

## 2020-07-17 MED ORDER — PREDNISONE 20 MG PO TABS: 40.0000 mg | ORAL_TABLET | Freq: Every day | ORAL | Status: DC

## 2020-07-17 MED ORDER — METOPROLOL TARTRATE 5 MG/5ML IV SOLN: 2.5000 mg | Freq: Four times a day (QID) | INTRAVENOUS | Status: DC | PRN

## 2020-07-17 MED ORDER — OSMOLITE 1.2 CAL PO LIQD
1000.0000 mL | ORAL | Status: DC
  Administered 2020-07-17: 1000 mL
  Filled 2020-07-17 (×2): qty 1000

## 2020-07-17 MED ORDER — SODIUM CHLORIDE 0.9 % IV SOLN
2.0000 g | INTRAVENOUS | Status: DC
  Filled 2020-07-17: qty 20

## 2020-07-17 MED ORDER — FREE WATER
200.0000 mL | Freq: Four times a day (QID) | Status: DC
  Administered 2020-07-17 – 2020-07-18 (×3): 200 mL

## 2020-07-17 MED ORDER — SODIUM CHLORIDE 0.9 % IV SOLN
500.0000 mg | INTRAVENOUS | Status: DC
  Administered 2020-07-17: 500 mg via INTRAVENOUS
  Filled 2020-07-17 (×2): qty 500

## 2020-07-17 MED ORDER — PREDNISONE 50 MG PO TABS: 50.0000 mg | ORAL_TABLET | Freq: Every day | ORAL | Status: DC

## 2020-07-17 MED ORDER — PREDNISONE 20 MG PO TABS: 20.0000 mg | ORAL_TABLET | Freq: Every day | ORAL | Status: DC

## 2020-07-17 MED ORDER — POTASSIUM CHLORIDE 20 MEQ PO PACK
40.0000 meq | PACK | Freq: Once | ORAL | Status: AC
  Administered 2020-07-17: 40 meq
  Filled 2020-07-17: qty 2

## 2020-07-17 MED ORDER — ACETAMINOPHEN 160 MG/5ML PO SOLN
650.0000 mg | Freq: Four times a day (QID) | ORAL | Status: DC | PRN
  Administered 2020-07-20: 650 mg
  Filled 2020-07-17: qty 20.3

## 2020-07-17 MED ORDER — SODIUM CHLORIDE 0.9 % IV SOLN
3.0000 g | Freq: Four times a day (QID) | INTRAVENOUS | Status: DC
  Administered 2020-07-17 – 2020-07-18 (×4): 3 g via INTRAVENOUS
  Filled 2020-07-17 (×3): qty 8
  Filled 2020-07-17: qty 3
  Filled 2020-07-17: qty 8
  Filled 2020-07-17 (×2): qty 3

## 2020-07-17 MED ORDER — METHYLPREDNISOLONE SODIUM SUCC 125 MG IJ SOLR
60.0000 mg | Freq: Two times a day (BID) | INTRAMUSCULAR | Status: AC
  Administered 2020-07-18 – 2020-07-19 (×4): 60 mg via INTRAVENOUS
  Filled 2020-07-17 (×4): qty 2

## 2020-07-17 MED ORDER — AZITHROMYCIN 500 MG PO TABS
500.0000 mg | ORAL_TABLET | Freq: Every day | ORAL | Status: DC
  Administered 2020-07-18 – 2020-07-20 (×3): 500 mg
  Filled 2020-07-17 (×3): qty 1

## 2020-07-17 MED ORDER — HYDROCODONE-ACETAMINOPHEN 7.5-325 MG/15ML PO SOLN
5.0000 mL | Freq: Four times a day (QID) | ORAL | Status: DC | PRN
  Administered 2020-07-17 – 2020-07-18 (×3): 5 mL
  Filled 2020-07-17 (×3): qty 15

## 2020-07-17 MED ORDER — METHYLPREDNISOLONE SODIUM SUCC 125 MG IJ SOLR: 60.0000 mg | Freq: Two times a day (BID) | INTRAMUSCULAR | Status: DC

## 2020-07-17 MED ORDER — METHYLPREDNISOLONE SODIUM SUCC 125 MG IJ SOLR
60.0000 mg | Freq: Every day | INTRAMUSCULAR | Status: DC
  Administered 2020-07-20: 60 mg via INTRAVENOUS
  Filled 2020-07-17: qty 2

## 2020-07-17 MED ORDER — PANTOPRAZOLE SODIUM 40 MG PO PACK
40.0000 mg | PACK | Freq: Two times a day (BID) | ORAL | Status: DC
  Administered 2020-07-17 – 2020-07-20 (×6): 40 mg
  Filled 2020-07-17 (×6): qty 20

## 2020-07-17 MED ORDER — POTASSIUM CHLORIDE 10 MEQ/50ML IV SOLN
10.0000 meq | INTRAVENOUS | Status: DC
  Administered 2020-07-17 (×5): 10 meq via INTRAVENOUS
  Filled 2020-07-17 (×5): qty 50

## 2020-07-17 MED ORDER — GABAPENTIN 600 MG PO TABS
300.0000 mg | ORAL_TABLET | Freq: Every day | ORAL | Status: DC
  Administered 2020-07-17: 300 mg
  Filled 2020-07-17: qty 1

## 2020-07-17 MED ORDER — INSULIN GLARGINE 100 UNIT/ML ~~LOC~~ SOLN
8.0000 [IU] | Freq: Every day | SUBCUTANEOUS | Status: DC
  Administered 2020-07-17: 8 [IU] via SUBCUTANEOUS
  Filled 2020-07-17 (×2): qty 0.08

## 2020-07-17 MED ORDER — PREDNISONE 20 MG PO TABS: 30.0000 mg | ORAL_TABLET | Freq: Every day | ORAL | Status: DC

## 2020-07-17 MED ORDER — METOPROLOL TARTRATE 12.5 MG HALF TABLET
12.5000 mg | ORAL_TABLET | Freq: Two times a day (BID) | ORAL | Status: DC
  Administered 2020-07-17 – 2020-07-20 (×6): 12.5 mg
  Filled 2020-07-17 (×6): qty 1

## 2020-07-17 MED ORDER — INSULIN GLARGINE 100 UNIT/ML ~~LOC~~ SOLN
5.0000 [IU] | Freq: Every day | SUBCUTANEOUS | Status: DC
  Filled 2020-07-17: qty 0.05

## 2020-07-17 MED ORDER — INSULIN ASPART 100 UNIT/ML ~~LOC~~ SOLN
0.0000 [IU] | SUBCUTANEOUS | Status: DC
  Administered 2020-07-17: 7 [IU] via SUBCUTANEOUS
  Administered 2020-07-17 (×2): 3 [IU] via SUBCUTANEOUS
  Administered 2020-07-18 (×2): 9 [IU] via SUBCUTANEOUS
  Administered 2020-07-18: 7 [IU] via SUBCUTANEOUS

## 2020-07-17 NOTE — Progress Notes (Signed)
Inpatient Diabetes Program Recommendations  AACE/ADA: New Consensus Statement on Inpatient Glycemic Control (2015)  Target Ranges:  Prepandial:   less than 140 mg/dL      Peak postprandial:   less than 180 mg/dL (1-2 hours)      Critically ill patients:  140 - 180 mg/dL   Lab Results  Component Value Date   GLUCAP 228 (H) 07/17/2020   HGBA1C 6.1 (H) 06/14/2020    Review of Glycemic Control Results for Patricia Villegas, Patricia Villegas (MRN [id number]) as of 07/17/2020 09:48  Ref. Range 07/16/2020 06:58 07/16/2020 12:07 07/16/2020 16:56 07/16/2020 21:35 07/17/2020 05:54  Glucose-Capillary Latest Ref Range: 70 - 99 mg/dL 155 (H) 121 (H) 166 (H) 208 (H) 228 (H)   Diabetes history: DM 2 Outpatient Diabetes medications:  Lantus 10 units daily, Novolog 0-6 units tid with meals Current orders for Inpatient glycemic control:  Solumedrol 1000 mg daily  Inpatient Diabetes Program Recommendations:   Note fasting CBG=228 mg/dL.   Please consider restarting Novolog very sensitive (0-6 units) tid with meals.  Also consider adding Lantus 5 units daily.   Thanks Adah Perl, RN, BC-ADM Inpatient Diabetes Coordinator Pager [phone] (8a-5p)

## 2020-07-17 NOTE — Progress Notes (Signed)
2 units of PRBCs given to the patient. Patient tolerated the infusion well. No signs of reactions seen. 20 mg of IV lasix given between the infusion.Order for H&H placed to be drawn @0815 .

## 2020-07-17 NOTE — Procedures (Signed)
Cortrak  Tube Type:  Cortrak - 43 inches Tube Location:  Left nare Initial Placement:  Stomach Secured by: Bridle Technique Used to Measure Tube Placement:  Documented cm marking at nare/ corner of mouth Cortrak Secured At:  66 cm    Cortrak Tube Team Note:  Consult received to place a Cortrak feeding tube.   No x-ray is required. RN may begin using tube.   If the tube becomes dislodged please keep the tube and contact the Cortrak team at [email or web address] (password [email or web address]) for replacement.  If after hours and replacement cannot be delayed, place a NG tube and confirm placement with an abdominal x-ray.    Koleen Distance MS, RD, LDN Please refer to [facility] for RD and/or RD on-call/weekend/after hours pager

## 2020-07-17 NOTE — Progress Notes (Signed)
Blood consent form read to the patient. Patient alert and oriented. Patient consented for blood and blood products. Patient's upper extremities very weak to hold the pen. Patient unable to sign the form but gave consent to receive blood. Charge nurse made aware. This RN witnessed the consent form. Consent in patient's chart.

## 2020-07-17 NOTE — Progress Notes (Signed)
[facility] Progress Note  Patient: Patricia Villegas    WCB:[id number]  DOA: 07/23/2020     Date of Service: the patient was seen and examined on 07/17/2020  Brief hospital course: Past medical history of RA; [name]; DM; HTN; [name]; and CAD s/p CABG presenting with leg pain. Found to have severe rhabdomyolysis along with elevated LFT. Neurology was consulted. GI was consulted for melena history.  Continues to have shortness of breath and now transferred to progressive care unit on BiPAP.  Currently plan is continue to support respiratory status, continue cortrak and monitor improvement in generalized weakness.  Assessment and Plan: 1. Nontraumatic rhabdomyolysis Polymyositis Generalized weakness of both upper and lower extremity primarily proximal along with pain in lower extremity. CK continues to trend up despite IV LR. Patient appears to be volume overloading. Patient was on Zocor currently on hold. Hepatic function panel also shows worsening LFT likely secondary to myositis. Neurology was consulted. Treated withhigh-dose steroids with 1 g IV Solu-Medrol x 5 days. On chronic prednisone secondary to history of rheumatoid arthritis.  Therefor will initiate taper instead of stopping it.  CK trending down with steroids. Still has generalized weakness fatigue and tiredness.   Explained to the patient and the family that this will take a long time to recover.  2. Recent DVT Diagnosed in October 2021. Started on Eliquis. Currently reports melena. GI was consulted. Currently no plan for intervention. Anticoagulation is currently on hold due to concern for acute anemia. Monitor Hb and initiate DVTPx.   3. Acute on chronic anemia with melena Acute on chronic blood loss anemia. Recently hospitalized for anemia underwent EGD found to have duodenal ulcer. Continuingtwice dailyPPI. Discontinue Eliquis for now. Hold anticoagulation for today. Resume DVTpX tomorrow if HB is stable.   S/p 2 PRBC on 07/16/2020.  4. Urinary retention Monitor for now.Reoccurred. Foley catheter placed. Do no think that the pt has UTI.  5.Acute on chronicdiastolic CHF Essential hypertension CAD SP CABG Acute hypoxic respiratory failure secondary to CHF Was volume overloaded. Now euvolemic Treated associated with IV albumin followed by IV Lasix.  Daily weight and strict I's and O's.  6. Rheumatoid arthritis Holding Plaquenil. Neuro prefers to start methotrexate again.  Currently there was concern about the possibility of infection and therefore initiation of methotrexate is on hold.  7.Dysphagia. Secondary to generalized weakness and fatigue but other than focal deficit. Underwent speech therapy evaluation with modified barium swallow. Repeat evaluation on 07/16/2020 shows that the patient reports significant weakness and inability to swallow safely and therefore currently remains n.p.o. Inserted Core track.  8.  Inadequate nutritional intake. Tube feeding initiated. Appreciate assistance.  9.  Aspiration pneumonia. Seen on CT scan of the abdomen. Initiate antibiotics. Changing ceftriaxone azithromycin to Unasyn azithromycin.  Diet: N.p.o. for now DVT Prophylaxis:   Place and maintain sequential compression device Start: 07/14/20 1122   Advance goals of care discussion: DNR  Family Communication: family was present at bedside, at the time of interview.  The pt provided permission to discuss medical plan with the family. Opportunity was given to ask question and all questions were answered satisfactorily.   Disposition:  Status is: Inpatient  Remains inpatient appropriate because:Ongoing diagnostic testing needed not appropriate for outpatient work up, IV treatments appropriate due to intensity of illness or inability to take PO and Inpatient level of care appropriate due to severity of illness   Dispo: The patient is from: Home  Anticipated d/c is  to: SNF              Anticipated d/c date is: > 3 days              Patient currently is not medically stable to d/c.  Subjective: No nausea no vomiting no fever no chills.  No chest pain.  Tolerating feedings.  No BM.  No bleeding.  Breathing okay.  Unable to tolerate p.o.  Physical Exam:  General: Appear in mild distress, no Rash; Oral Mucosa Clear, moist. no Abnormal Neck Mass Or lumps, Conjunctiva normal  Cardiovascular: S1 and S2 Present, no Murmur, Respiratory: increased respiratory effort, Bilateral Air entry present and bilateral  Crackles, no wheezes Abdomen: Bowel Sound present, Soft and no tenderness Extremities: trace Pedal edema Neurology: alert and oriented to time, place, and person affect appropriate. no new focal deficit Gait not checked due to patient safety concerns   Vitals:   07/17/20 0800 07/17/20 1143 07/17/20 1200 07/17/20 1400  BP: (!) 146/63 134/60 (!) 126/48 (!) 136/51  Pulse: 73 68 65 67  Resp: 13 17 14 19   Temp: 97.9 F (36.6 C) (!) 97.5 F (36.4 C)    TempSrc: Oral Oral    SpO2:  100%    Weight:      Height:        Intake/Output Summary (Last 24 hours) at 07/17/2020 1709 Last data filed at 07/17/2020 1500 Gross per 24 hour  Intake 1678.56 ml  Output 2475 ml  Net -796.44 ml   Filed Weights   07/16/20 0430 07/16/20 0800 07/17/20 0327  Weight: 70 kg 67 kg 69.6 kg    Data Reviewed: I have personally reviewed and interpreted daily labs, tele strips, imagings as discussed above. I reviewed all nursing notes, pharmacy notes, vitals, pertinent old records I have discussed plan of care as described above with RN and patient/family.  CBC: Recent Labs  Lab 07/15/20 0529 07/15/20 0529 07/16/20 0438 07/16/20 1810 07/16/20 2100 07/17/20 0612 07/17/20 0900  WBC 32.2*  --  30.1* 22.7*  --  21.8* 21.1*  HGB 9.5*   < > 7.7* 6.7* 6.4* 9.3* 9.5*  HCT 28.6*   < > 24.5* 21.7* 20.8* 29.1* 29.6*  MCV 105.1*  --  106.1* 108.5*  --  97.7 98.3  PLT 206   --  187 143*  --  128* 131*   < > = values in this interval not displayed.   Basic Metabolic Panel: Recent Labs  Lab 07/24/2020 0111 07/17/2020 0459 07/14/20 0342 07/15/20 0529 07/16/20 0438 07/16/20 0902 07/16/20 1810 07/17/20 0900  NA 141   < > 140 142 148*  --  147* 152*  K 4.0   < > 4.4 4.5 2.2*  --  3.4* 2.6*  CL 106   < > 107 109 106  --  107 110  CO2 26   < > 19* 22 28  --  26 27  GLUCOSE 106*   < > 189* 200* 147*  --  192* 250*  BUN 23   < > 33* 44* 39*  --  38* 41*  CREATININE 0.65   < > 0.64 0.62 0.64  --  0.63 0.64  CALCIUM 8.4*   < > 8.0* 8.0* 8.3*  --  8.3* 8.5*  MG 2.2  --   --   --   --  1.9 2.4  --   PHOS  --   --   --   --   --   --   --  3.9   < > = values in this interval not displayed.    Studies: CT ABDOMEN PELVIS W CONTRAST  Result Date: 07/16/2020 CLINICAL DATA:  Anemia EXAM: CT ABDOMEN AND PELVIS WITH CONTRAST TECHNIQUE: Multidetector CT imaging of the abdomen and pelvis was performed using the standard protocol following bolus administration of intravenous contrast. CONTRAST:  162mL OMNIPAQUE IOHEXOL 300 MG/ML  SOLN COMPARISON:  07/16/2020, 07/12/2020 FINDINGS: Lower chest: There are small bilateral pleural effusions. New right lower lobe airspace disease which could reflect aspiration or pneumonia. Minimal dependent lower lobe atelectasis. Dense calcification of the mitral valve unchanged. Hepatobiliary: Calcified gallstones are again identified without cholecystitis. Liver is unremarkable. Pancreas: Unremarkable. No pancreatic ductal dilatation or surrounding inflammatory changes. Spleen: Normal in size without focal abnormality. Adrenals/Urinary Tract: Stable right renal cyst. Otherwise the kidneys enhance normally. The adrenals and bladder are unremarkable. Stomach/Bowel: Dense oral contrast is seen throughout the colon. No bowel obstruction or ileus. No wall thickening or inflammatory change. Vascular/Lymphatic: Aortic atherosclerosis. No enlarged abdominal or  pelvic lymph nodes. Reproductive: Status post hysterectomy. No adnexal masses. Other: No free fluid or free gas.  No abdominal wall hernia. Musculoskeletal: Chronic compression deformities are seen at T11 and L1. Stable postsurgical changes at the L4/L5 level. Bilateral hip arthroplasties are stable. There are no acute bony abnormalities. Reconstructed images demonstrate no additional findings. IMPRESSION: 1. Right lower lobe airspace disease which could reflect infection or aspiration. 2. Stable small bilateral pleural effusions. 3. Cholelithiasis without cholecystitis. 4.  Aortic Atherosclerosis (ICD10-I70.0). Electronically Signed   By: Randa Ngo M.D.   On: 07/16/2020 23:42    Scheduled Meds:  [START ON 07/18/2020] azithromycin  500 mg Per Tube Daily   chlorhexidine  15 mL Mouth Rinse BID   Chlorhexidine Gluconate Cloth  6 each Topical Daily   free water  200 mL Per Tube Q6H   gabapentin  300 mg Per Tube QHS   insulin aspart  0-9 Units Subcutaneous Q4H   liver oil-zinc oxide  1 application Topical TID   magic mouthwash  5 mL Oral QID   mouth rinse  15 mL Mouth Rinse q12n4p   [START ON 07/18/2020] methylPREDNISolone (SOLU-MEDROL) injection  60 mg Intravenous BID   Followed by   Derrill Memo ON 07/20/2020] methylPREDNISolone (SOLU-MEDROL) injection  60 mg Intravenous Daily   Followed by   Derrill Memo ON 07/23/2020] predniSONE  50 mg Per Tube Q breakfast   Followed by   Derrill Memo ON 07/26/2020] predniSONE  40 mg Per Tube Q breakfast   Followed by   Derrill Memo ON 07/29/2020] predniSONE  30 mg Per Tube Q breakfast   Followed by   Derrill Memo ON 08/01/2020] predniSONE  20 mg Per Tube Q breakfast   metoprolol tartrate  12.5 mg Per Tube BID   pantoprazole sodium  40 mg Per Tube BID   sodium chloride flush  10-40 mL Intracatheter Q12H   Continuous Infusions:  ampicillin-sulbactam (UNASYN) IV 3 g (07/17/20 1353)   feeding supplement (OSMOLITE 1.2 CAL) 1,000 mL (07/17/20 1604)    methylPREDNISolone (SOLU-MEDROL) injection 1,000 mg (07/17/20 1048)   potassium chloride 10 mEq (07/17/20 1707)   PRN Meds: acetaminophen (TYLENOL) oral liquid 160 mg/5 mL, [DISCONTINUED] acetaminophen **OR** acetaminophen, HYDROcodone-acetaminophen, metoprolol tartrate, morphine injection, ondansetron **OR** ondansetron (ZOFRAN) IV, phenol, sodium chloride flush  Time spent: 35 minutes  Author: Berle Mull, MD [facility] 07/17/2020 5:09 PM  To reach On-call, see care teams to locate the attending and reach out via [email or web address]. Between 7PM-7AM, please contact night-coverage  If you still have difficulty reaching the attending provider, please page the [facility] (Director on Call) for [facility] on amion for assistance.

## 2020-07-17 NOTE — Progress Notes (Signed)
Brief Nutrition Note  Consult received for enteral/tube feeding initiation and management. Plan is for Cortrak placement today by Cortrak team.  Adult Enteral Nutrition Protocol initiated. Full assessment to follow.  Admitting Dx: Rhabdomyolysis [M62.82] Non-traumatic rhabdomyolysis [M62.82]  Body mass index is 24.77 kg/m. Pt meets criteria for normal weight based on current BMI.  Labs:  Recent Labs  Lab 08/04/2020 0111 07/10/2020 0459 07/16/20 0438 07/16/20 0902 07/16/20 1810 07/17/20 0900  NA 141   < > 148*  --  147* 152*  K 4.0   < > 2.2*  --  3.4* 2.6*  CL 106   < > 106  --  107 110  CO2 26   < > 28  --  26 27  BUN 23   < > 39*  --  38* 41*  CREATININE 0.65   < > 0.64  --  0.63 0.64  CALCIUM 8.4*   < > 8.3*  --  8.3* 8.5*  MG 2.2  --   --  1.9 2.4  --   PHOS  --   --   --   --   --  3.9  GLUCOSE 106*   < > 147*  --  192* 250*   < > = values in this interval not displayed.    Gaynell Face, MS, RD, LDN Inpatient Clinical Dietitian Please see AMiON for contact information.

## 2020-07-17 NOTE — Plan of Care (Signed)
  Problem: Health Behavior/Discharge Planning: Goal: Ability to manage health-related needs will improve Outcome: Progressing   Problem: Activity: Goal: Risk for activity intolerance will decrease Outcome: Progressing   Problem: Nutrition: Goal: Adequate nutrition will be maintained Outcome: Progressing   Problem: Coping: Goal: Level of anxiety will decrease Outcome: Progressing   Problem: Elimination: Goal: Will not experience complications related to bowel motility Outcome: Progressing Goal: Will not experience complications related to urinary retention Outcome: Progressing   Problem: Pain Managment: Goal: General experience of comfort will improve Outcome: Progressing   Problem: Safety: Goal: Ability to remain free from injury will improve Outcome: Progressing   

## 2020-07-17 NOTE — Progress Notes (Signed)
  Speech Language Pathology Treatment: Dysphagia  Patient Details Name: Patricia Villegas MRN: [id number] DOB: 02-27-1935 Today's Date: 07/17/2020 Time: 5456-2563 SLP Time Calculation (min) (ACUTE ONLY): 15 min  Assessment / Plan / Recommendation Clinical Impression  Pt was seen for dysphagia treatment. She reported that she thinks she is doing better with regards to swallowing and that she has been enjoying the ice chips. Pt inconsistently exhibited coughing and throat clearing with thin liquids and to a lesser extent with puree. Four-five multiple swallows were noted with purees and one-three secondary swallows were observed with thin liquids. Cotrak placement was recommended by GI. Considering pt's current presentation and the level of effort required for deglutition of even small boluses, SLP questions adequacy of p.o. intake and is therefore in agreement with this recommendation. Pt was educated regarding her presentation, SLP's concerns, and recommendations; she verbalized understanding and agreement. Dr. Posey Pronto was updated on the pt's performance and plan of care. An NPO status is recommended but pt may have ice chips following oral care. SLP will continue to follow pt.    HPI HPI: Pt is an 85 y.o. female with medical history significant of RA, [name], DM, HTN, [name], and CAD s/p CABG who presented with leg pain.  She had physical therapy sessions and became very weak came back to the hospital was found to have a bleeding ulcer and a DVT. Per neurology's note, pt has had an insidious slow progressive onset of weakness for 3-4 weeks concerning for myopathy. SLP was consulted since dysphagia may be related to myopathy.  In evening of 11/5 Rapid Response was called due to patient c/o SOB and increased WOB but no acute interventions were required as patient's oxygen saturation was 94% on 2L nasal cannula and RR was 18-20. Rapid Response called again on 11/6 due to same reasons and at this time, patient was on 6L  oxygen via nasal cannula which was then increased to 8L. She was transferred to higher level of care on 2C. SLP ordered for repeat BSE due to these changes.      SLP Plan  Continue with current plan of care       Recommendations  Diet recommendations: NPO (Pt may have ice chips following oral care) Medication Administration: Via alternative means Compensations:  (Use cervical collar with p.o. intake) Postural Changes and/or Swallow Maneuvers: Seated upright 90 degrees                Oral Care Recommendations: Oral care BID;Staff/trained caregiver to provide oral care Follow up Recommendations:  (TBD) SLP Visit Diagnosis: Dysphagia, oropharyngeal phase (R13.12) Plan: Continue with current plan of care       Ahja Martello I. Hardin Negus, [place], [place] Office number [phone] Pager [employer] 07/17/2020, 1:05 PM

## 2020-07-18 ENCOUNTER — Inpatient Hospital Stay (HOSPITAL_COMMUNITY): Payer: Medicare Other

## 2020-07-18 DIAGNOSIS — R7989 Other specified abnormal findings of blood chemistry: Secondary | ICD-10-CM | POA: Diagnosis not present

## 2020-07-18 DIAGNOSIS — E440 Moderate protein-calorie malnutrition: Secondary | ICD-10-CM | POA: Diagnosis present

## 2020-07-18 LAB — CBC
HCT: 31.5 % — ABNORMAL LOW (ref 36.0–46.0)
Hemoglobin: 10.1 g/dL — ABNORMAL LOW (ref 12.0–15.0)
MCH: 31.8 pg (ref 26.0–34.0)
MCHC: 32.1 g/dL (ref 30.0–36.0)
MCV: 99.1 fL (ref 80.0–100.0)
Platelets: 130 10*3/uL — ABNORMAL LOW (ref 150–400)
RBC: 3.18 MIL/uL — ABNORMAL LOW (ref 3.87–5.11)
RDW: 23.8 % — ABNORMAL HIGH (ref 11.5–15.5)
WBC: 25.8 10*3/uL — ABNORMAL HIGH (ref 4.0–10.5)
nRBC: 0.3 % — ABNORMAL HIGH (ref 0.0–0.2)

## 2020-07-18 LAB — MYOGLOBIN, SERUM: Myoglobin: 3771 ng/mL — ABNORMAL HIGH (ref 25–58)

## 2020-07-18 LAB — COMPREHENSIVE METABOLIC PANEL
ALT: 264 U/L — ABNORMAL HIGH (ref 0–44)
AST: 58 U/L — ABNORMAL HIGH (ref 15–41)
Albumin: 2.6 g/dL — ABNORMAL LOW (ref 3.5–5.0)
Alkaline Phosphatase: 53 U/L (ref 38–126)
Anion gap: 9 (ref 5–15)
BUN: 47 mg/dL — ABNORMAL HIGH (ref 8–23)
CO2: 30 mmol/L (ref 22–32)
Calcium: 8.2 mg/dL — ABNORMAL LOW (ref 8.9–10.3)
Chloride: 114 mmol/L — ABNORMAL HIGH (ref 98–111)
Creatinine, Ser: 0.63 mg/dL (ref 0.44–1.00)
GFR, Estimated: >60 mL/min (ref >60)
Glucose, Bld: 421 mg/dL — ABNORMAL HIGH (ref 70–99)
Potassium: 4 mmol/L (ref 3.5–5.1)
Sodium: 153 mmol/L — ABNORMAL HIGH (ref 135–145)
Total Bilirubin: 0.8 mg/dL (ref 0.3–1.2)
Total Protein: 4.7 g/dL — ABNORMAL LOW (ref 6.5–8.1)

## 2020-07-18 LAB — BPAM RBC
Blood Product Expiration Date: 202111242359
Blood Product Expiration Date: 202111242359
ISSUE DATE / TIME: 202111072357
ISSUE DATE / TIME: 202111080307
Unit Type and Rh: 6200
Unit Type and Rh: 6200

## 2020-07-18 LAB — GLUCOSE, CAPILLARY
Glucose-Capillary: 328 mg/dL — ABNORMAL HIGH (ref 70–99)
Glucose-Capillary: 343 mg/dL — ABNORMAL HIGH (ref 70–99)
Glucose-Capillary: 352 mg/dL — ABNORMAL HIGH (ref 70–99)
Glucose-Capillary: 353 mg/dL — ABNORMAL HIGH (ref 70–99)
Glucose-Capillary: 361 mg/dL — ABNORMAL HIGH (ref 70–99)
Glucose-Capillary: 372 mg/dL — ABNORMAL HIGH (ref 70–99)
Glucose-Capillary: 380 mg/dL — ABNORMAL HIGH (ref 70–99)

## 2020-07-18 LAB — BASIC METABOLIC PANEL
Anion gap: 11 (ref 5–15)
Anion gap: 12 (ref 5–15)
BUN: 45 mg/dL — ABNORMAL HIGH (ref 8–23)
BUN: 45 mg/dL — ABNORMAL HIGH (ref 8–23)
CO2: 30 mmol/L (ref 22–32)
CO2: 32 mmol/L (ref 22–32)
Calcium: 8.1 mg/dL — ABNORMAL LOW (ref 8.9–10.3)
Calcium: 8 mg/dL — ABNORMAL LOW (ref 8.9–10.3)
Chloride: 113 mmol/L — ABNORMAL HIGH (ref 98–111)
Chloride: 110 mmol/L (ref 98–111)
Creatinine, Ser: 0.61 mg/dL (ref 0.44–1.00)
Creatinine, Ser: 0.55 mg/dL (ref 0.44–1.00)
GFR, Estimated: >60 mL/min (ref >60)
GFR, Estimated: >60 mL/min (ref >60)
Glucose, Bld: 384 mg/dL — ABNORMAL HIGH (ref 70–99)
Glucose, Bld: 368 mg/dL — ABNORMAL HIGH (ref 70–99)
Potassium: 3.7 mmol/L (ref 3.5–5.1)
Potassium: 3.1 mmol/L — ABNORMAL LOW (ref 3.5–5.1)
Sodium: 154 mmol/L — ABNORMAL HIGH (ref 135–145)
Sodium: 154 mmol/L — ABNORMAL HIGH (ref 135–145)

## 2020-07-18 LAB — TYPE AND SCREEN
ABO/RH(D): A POS
Antibody Screen: NEGATIVE
Unit division: 0
Unit division: 0

## 2020-07-18 LAB — RSV(RESPIRATORY SYNCYTIAL VIRUS) AB, BLOOD: RSV Ab: NEGATIVE

## 2020-07-18 LAB — HEPARIN LEVEL (UNFRACTIONATED): Heparin Unfractionated: 0.92 IU/mL — ABNORMAL HIGH (ref 0.30–0.70)

## 2020-07-18 LAB — HSV(HERPES SIMPLEX VRS) I + II AB-IGM: HSVI/II Comb IgM: <0.91 Ratio (ref 0.00–0.90)

## 2020-07-18 LAB — CK: Total CK: 1006 U/L — ABNORMAL HIGH (ref 38–234)

## 2020-07-18 LAB — HAPTOGLOBIN: Haptoglobin: 236 mg/dL (ref 41–333)

## 2020-07-18 LAB — APTT: aPTT: 24 seconds (ref 24–36)

## 2020-07-18 MED ORDER — OSMOLITE 1.2 CAL PO LIQD
1000.0000 mL | ORAL | Status: DC
  Administered 2020-07-18: 1000 mL
  Filled 2020-07-18 (×3): qty 1000

## 2020-07-18 MED ORDER — INSULIN GLARGINE 100 UNIT/ML ~~LOC~~ SOLN
12.0000 [IU] | Freq: Every day | SUBCUTANEOUS | Status: DC
  Administered 2020-07-18: 12 [IU] via SUBCUTANEOUS
  Filled 2020-07-18 (×3): qty 0.12

## 2020-07-18 MED ORDER — GUAIFENESIN 100 MG/5ML PO SOLN
10.0000 mL | Freq: Four times a day (QID) | ORAL | Status: DC
  Administered 2020-07-18 – 2020-07-20 (×8): 200 mg
  Filled 2020-07-18 (×2): qty 10
  Filled 2020-07-18 (×2): qty 5
  Filled 2020-07-18: qty 10
  Filled 2020-07-18: qty 5
  Filled 2020-07-18 (×2): qty 10
  Filled 2020-07-18: qty 5

## 2020-07-18 MED ORDER — FUROSEMIDE 40 MG PO TABS
40.0000 mg | ORAL_TABLET | Freq: Every day | ORAL | Status: DC
  Administered 2020-07-18: 40 mg
  Filled 2020-07-18: qty 1

## 2020-07-18 MED ORDER — GABAPENTIN 300 MG PO CAPS
300.0000 mg | ORAL_CAPSULE | Freq: Two times a day (BID) | ORAL | Status: DC
  Administered 2020-07-18 – 2020-07-20 (×4): 300 mg
  Filled 2020-07-18 (×4): qty 1

## 2020-07-18 MED ORDER — PROSOURCE TF PO LIQD
45.0000 mL | Freq: Two times a day (BID) | ORAL | Status: DC
  Administered 2020-07-18 – 2020-07-20 (×5): 45 mL
  Filled 2020-07-18 (×5): qty 45

## 2020-07-18 MED ORDER — FUROSEMIDE 10 MG/ML IJ SOLN
40.0000 mg | Freq: Every day | INTRAMUSCULAR | Status: DC
  Administered 2020-07-18 – 2020-07-25 (×8): 40 mg via INTRAVENOUS
  Filled 2020-07-18 (×8): qty 4

## 2020-07-18 MED ORDER — FREE WATER
200.0000 mL | Status: DC
  Administered 2020-07-18 – 2020-07-20 (×11): 200 mL

## 2020-07-18 MED ORDER — HEPARIN SODIUM (PORCINE) 5000 UNIT/ML IJ SOLN
5000.0000 [IU] | Freq: Three times a day (TID) | INTRAMUSCULAR | Status: DC
  Administered 2020-07-18 – 2020-07-20 (×6): 5000 [IU] via SUBCUTANEOUS
  Filled 2020-07-18 (×6): qty 1

## 2020-07-18 MED ORDER — INSULIN ASPART 100 UNIT/ML ~~LOC~~ SOLN
0.0000 [IU] | SUBCUTANEOUS | Status: DC
  Administered 2020-07-18: 11 [IU] via SUBCUTANEOUS
  Administered 2020-07-18 (×3): 15 [IU] via SUBCUTANEOUS
  Administered 2020-07-19: 11 [IU] via SUBCUTANEOUS
  Administered 2020-07-19: 8 [IU] via SUBCUTANEOUS
  Administered 2020-07-19 (×2): 11 [IU] via SUBCUTANEOUS
  Administered 2020-07-19: 5 [IU] via SUBCUTANEOUS
  Administered 2020-07-20 (×2): 8 [IU] via SUBCUTANEOUS
  Administered 2020-07-20: 15 [IU] via SUBCUTANEOUS

## 2020-07-18 MED ORDER — SODIUM CHLORIDE 0.9 % IV SOLN
3.0000 g | Freq: Four times a day (QID) | INTRAVENOUS | Status: DC
  Administered 2020-07-18 – 2020-07-20 (×8): 3 g via INTRAVENOUS
  Filled 2020-07-18: qty 8
  Filled 2020-07-18: qty 3
  Filled 2020-07-18: qty 8
  Filled 2020-07-18: qty 3
  Filled 2020-07-18: qty 8
  Filled 2020-07-18 (×4): qty 3
  Filled 2020-07-18: qty 8
  Filled 2020-07-18: qty 3

## 2020-07-18 NOTE — Progress Notes (Signed)
Physical Therapy Treatment Patient Details Name: Patricia Villegas MRN: [id number] DOB: 1935-03-18 Today's Date: 07/18/2020    History of Present Illness Pt is an 85 y.o. female admitted 08/02/2020 with leg pain and worsening weakness. Workup for severe rhabdomyolysis, presumed myositis. PMH includes RA, [name], DM, HTN, CAD s/p CABG, R THA (direct anterior 05/2020). Of note, 2x recent admissions 06/2020 with weakness, DVT, subacute L1 compression fx.    PT Comments    Pt supine in bed on arrival this session.  Pt remains weak and has limited use of extremities.  She would benefit from a kreg tilt bed to maximize verticalization.  Plan for SNF remains appropriate.    Follow Up Recommendations  SNF;Supervision for mobility/OOB     Equipment Recommendations       Recommendations for Other Services       Precautions / Restrictions Precautions Precautions: Fall Precaution Comments: back precautions for pain control due to L1 subacute compression fx; recent R THA 05/2020 (direct anterior, WBAT) Required Braces or Orthoses: Cervical Brace Cervical Brace: Soft collar (when eating to decrease risk for aspiration.) Restrictions Weight Bearing Restrictions: No    Mobility  Bed Mobility Overal bed mobility: Needs Assistance Bed Mobility: Rolling Rolling: Total assist         General bed mobility comments: Performed rolling to R and L side for re-adjustment of pads.  Pt placed in modified chair position post session.  Transfers                    Ambulation/Gait                 Stairs             Wheelchair Mobility    Modified Rankin (Stroke Patients Only)       Balance                                            Cognition Arousal/Alertness: Awake/alert Behavior During Therapy: WFL for tasks assessed/performed Overall Cognitive Status: Impaired/Different from baseline                                 General Comments:  most likely baseline memory impairment; decreased STM during session      Exercises General Exercises - Lower Extremity Ankle Circles/Pumps: AAROM;PROM;Both;10 reps;Supine Quad Sets: AROM;Both;5 reps;Supine;Limitations Quad Sets Limitations: trace muscle contractions.    General Comments        Pertinent Vitals/Pain Pain Assessment: No/denies pain    Home Living                      Prior Function            PT Goals (current goals can now be found in the care plan section) Acute Rehab PT Goals Patient Stated Goal: go to rehab, be able to stand and walk again Potential to Achieve Goals: Fair Progress towards PT goals: Progressing toward goals    Frequency    Min 2X/week      PT Plan Current plan remains appropriate    Co-evaluation              AM-PAC PT "6 Clicks" Mobility   Outcome Measure  Help needed turning from your back to your side while in a flat bed  without using bedrails?: Total Help needed moving from lying on your back to sitting on the side of a flat bed without using bedrails?: Total Help needed moving to and from a bed to a chair (including a wheelchair)?: Total Help needed standing up from a chair using your arms (e.g., wheelchair or bedside chair)?: Total Help needed to walk in hospital room?: Total Help needed climbing 3-5 steps with a railing? : Total 6 Click Score: 6    End of Session   Activity Tolerance: Patient limited by fatigue Patient left: in bed;with call bell/phone within reach;with bed alarm set (in modified chair position.) Nurse Communication: Mobility status;Need for lift equipment PT Visit Diagnosis: Other abnormalities of gait and mobility (R26.89);Muscle weakness (generalized) (M62.81);Pain Pain - Right/Left: Right Pain - part of body: Hip     Time: 8527-7824 PT Time Calculation (min) (ACUTE ONLY): 21 min  Charges:  $Therapeutic Activity: 8-22 mins                     Erasmo Leventhal , PTA Acute  Rehabilitation Services Pager [phone] Office [phone]     Vannia Pola Eli Hose 07/18/2020, 4:24 PM

## 2020-07-18 NOTE — Progress Notes (Signed)
Initial Nutrition Assessment  DOCUMENTATION CODES:   Non-severe (moderate) malnutrition in context of acute illness/injury  INTERVENTION:   Tube feeding via Cortrak tube: - Osmolite 1.2 @ 55 ml/hr (1320 ml/day) - ProSource TF 45 ml BID - Free water flushes per MD, currently 200 ml q 4 hours  Tube feeding regimen provides 1664 kcal, 95 grams of protein, and 1082 ml of H2O.  Total free water with current flushes: 2282 ml  NUTRITION DIAGNOSIS:   Moderate Malnutrition related to acute illness (rhabdomyolysis, polymyositis) as evidenced by mild fat depletion, moderate muscle depletion.  GOAL:   Patient will meet greater than or equal to 90% of their needs  MONITOR:   Diet advancement, Labs, Weight trends, TF tolerance, Skin, I & O's  REASON FOR ASSESSMENT:   Consult Enteral/tube feeding initiation and management, Assessment of nutrition requirement/status  ASSESSMENT:   85 year old female who presented to the ED from SNF on 11/02 with bilateral leg pain. PMH of RA, [name], T2DM, HTN, [name], CAD s/p CABG, anemia, DVT, CHF, gout, CKD stage II. Pt with multiple recent admissions since elective hip surgery in September 2021. Pt admitted with rhabdomyolysis and presumed myositis/myopathy.   11/04 - MBS, diet advanced to dysphagia 2 with thin liquids 11/06 - NPO 11/08 - Cortrak placed (tip gastric per Cortrak team), TF initiated  Pt currently being treated with high-dose steroids for polymyositis. Pt also being treated with antibiotics for aspiration pneumonia.  Spoke with pt and family member at bedside. Family member asking when pt will start feeling more energy after TF started yesterday afternoon. Discussed with pt's family member that pt's feelings of low energy are multifactorial and will likely not improve just with addition of TF.  Pt reports that her UBW is 138 lbs. She last weighed this prior to hip surgery in September. Pt reports that did lose weight after her surgery but is  unsure how much. Current weight is 153 lbs. Suspect weight is falsely elevated related to fluid status. Pt with +1 pitting edema to BUE and non-pitting edema to BLE. Will utilize ~64.5 kg as EDW (weight from encounter on 07/02/20).  Meal completions prior to NPO were 20-25%.  Discussed pt with RN. Pt tolerating current TF without issue. MD adjusting free water flushes due to volume overload.  CBG's are elevated. RD to monitor and adjust TF regimen as appropriate. Would like to continue fiber-free Osmolite 1.2 cal formula at this time given malnutrition and poor PO intake. Can transition pt to Glucerna formula if CBG's remain elevated. Discussed with RN.  Current TF: Osmolite 1.5 @ 50 ml/hr, free water flushes of 200 ml q 4 hours  Medications reviewed and include: lasix, SSI q 4 hours, lantus 8 units daily, magic mouthwash, solu-medrol, protonix, IV abx  Labs reviewed: sodium 153, BUN 47, elevated LFTs CBG's: 219-380 x 24 hours  UOP: 1150 ml x 24 hours I/O's: +1.1 L since admit  NUTRITION - FOCUSED PHYSICAL EXAM:    Most Recent Value  Orbital Region Moderate depletion  Upper Arm Region Mild depletion  Thoracic and Lumbar Region Mild depletion  Buccal Region Mild depletion  Temple Region Mild depletion  Clavicle Bone Region Moderate depletion  Clavicle and Acromion Bone Region Moderate depletion  Scapular Bone Region Mild depletion  Dorsal Hand Mild depletion  Patellar Region Mild depletion  Anterior Thigh Region Moderate depletion  Posterior Calf Region Moderate depletion  Edema (RD Assessment) Mild  [BUE, BLE]  Hair Reviewed  Eyes Reviewed  Mouth Reviewed  Skin  Reviewed  Nails Reviewed       Diet Order:   Diet Order            Diet NPO time specified Except for: Ice Chips  Diet effective now                 EDUCATION NEEDS:   Education needs have been addressed  Skin:  Skin Assessment: Skin Integrity Issues: Stage II: right buttocks  Last BM:   07/17/20  Height:   Ht Readings from Last 1 Encounters:  07/14/2020 5\' 6"  (1.676 m)    Weight:   Wt Readings from Last 1 Encounters:  07/17/20 69.6 kg    Ideal Body Weight:  59.1 kg  BMI:  Body mass index is 24.77 kg/m.  Estimated Nutritional Needs:   Kcal:  1600-1800  Protein:  85-100 grams  Fluid:  1.5 L    Gaynell Face, MS, RD, LDN Inpatient Clinical Dietitian Please see AMiON for contact information.

## 2020-07-18 NOTE — Progress Notes (Signed)
Inpatient Diabetes Program Recommendations  AACE/ADA: New Consensus Statement on Inpatient Glycemic Control (2015)  Target Ranges:  Prepandial:   less than 140 mg/dL      Peak postprandial:   less than 180 mg/dL (1-2 hours)      Critically ill patients:  140 - 180 mg/dL   Lab Results  Component Value Date   GLUCAP 343 (H) 07/18/2020   HGBA1C 6.1 (H) 06/14/2020    Review of Glycemic Control  Results for HUDSYN, BARICH (MRN [id number]) as of 07/18/2020 09:46  Ref. Range 07/16/2020 21:35 07/17/2020 05:54 07/17/2020 11:41 07/17/2020 17:10 07/17/2020 20:45 07/18/2020 00:03 07/18/2020 04:22 07/18/2020 07:58  Glucose-Capillary Latest Ref Range: 70 - 99 mg/dL 208 (H) 228 (H) 219 (H) 229 (H) 337 (H) 352 (H) 380 (H) 343 (H)   Diabetes history: DM 2 Outpatient Diabetes medications: Novolog 0-6 units tid, Lantus 10 units q HS Current orders for Inpatient glycemic control:  Novolog sensitive q 4 hours, Lantus 8 units q HS Currently on steroid taper. Osmolite 50 ml/hr  Inpatient Diabetes Program Recommendations:   Please add Novolog 3 units q 4 hours to cover CHO in tube feeds.   Thanks  Adah Perl, RN, BC-ADM Inpatient Diabetes Coordinator Pager [phone] (8a-5p)

## 2020-07-18 NOTE — Plan of Care (Signed)
  Problem: Health Behavior/Discharge Planning: Goal: Ability to manage health-related needs will improve Outcome: Progressing   Problem: Activity: Goal: Risk for activity intolerance will decrease Outcome: Progressing   Problem: Nutrition: Goal: Adequate nutrition will be maintained Outcome: Progressing   Problem: Coping: Goal: Level of anxiety will decrease Outcome: Progressing   Problem: Elimination: Goal: Will not experience complications related to bowel motility Outcome: Progressing Goal: Will not experience complications related to urinary retention Outcome: Progressing   Problem: Pain Managment: Goal: General experience of comfort will improve Outcome: Progressing   Problem: Safety: Goal: Ability to remain free from injury will improve Outcome: Progressing   

## 2020-07-18 NOTE — Progress Notes (Signed)
[facility] Progress Note  Patient: Patricia Villegas    MOQ:[id number]  DOA: 07/15/2020     Date of Service: the patient was seen and examined on 07/18/2020  Brief hospital course: Past medical history of RA; [name]; DM; HTN; [name]; and CAD s/p CABG presenting with leg pain. Found to have severe rhabdomyolysis along with elevated LFT as well as anemia.  Neurology, GI were consulted. 11/2 admitted for rhabdomyolysis started on IV fluids. 11/4-11/8 IV Solu-Medrol 1 g. 11/6 transferred to progressive care unit due to worsening respiratory distress. 11/7 GI signed off.  No intervention planned.  Continue PPI twice daily. 11/8 cortrack inserted  Currently plan is continue current care.  Assessment and Plan: 1.  Autoimmune poly-myositis with potential toxic myositis Nontraumatic rhabdomyolysis Presented with leg pain and generalized weakness. CK peak at 31,000. No improvement with IV hydration. Improving to 1000 right now with IV Solu-Medrol 1 g. Serum myoglobin also improving. Aldolase 77, LDH mildly elevated, thyroid panel unremarkable, ESR 69, CRP 1.6, Anti-HMGCR Ab pending, MyoMarker 3 Profile pending, acetylcholine receptor antibody negative. Neuro think that there is also potential for toxic myositis from Plaquenil and statin. Currently these medications are on hold. Continue to monitor CK level. Continue PT and OT. Monitor in progressive care unit, NIF -26. Pain control with gabapentin. Initial plan was to start the patient on methotrexate prior dose 25 mg weekly, but currently on hold secondary to concern for pneumonia.  2. Acute on chronic iron deficiency anemia with melena Acute on chronic blood loss anemia. 2 non-bleeding duodenal ulcers seen on EGD done 06/17/2020 by [facility] Recent H. pylori infection-treated. Recently hospitalized for anemia underwent EGD found to have duodenal ulcer. [facility] was consulted.  Recommend conservative measures with no indication for  intervention if hemoglobin remained stable and the patient does not have any acute bleeding. Ferritin level normal, iron level very low 11.  Reticulocyte count elevated.  B12 608, folate 18, fibrinogen elevated, Coombs test negative. Continuingtwice dailyPPI. Discontinue Eliquis for now. Hold anticoagulation Resume DVTpX tomorrow if HB is stable. S/p 2 PRBC on 07/16/2020. Tolerated very well and at present H&H remained stable.  3. Acute on chronicdiastolic CHF Essential hypertension CAD SP CABG Acute hypoxic respiratory failure secondary to CHF volume overloaded. euvolemic with aggressive had IV diuresis but currently volume up again. Treated associated with IV albuminfollowed by IV Lasix. Echocardiogram 10/24 EF 60 to 35%, grade 1 diastolic dysfunction. Last chest x-ray on 11/9 consistent with acute right basilar pneumonia. We will continue IV Lasix for now. Daily weight and strict I's and O's.  4.  Dysphagia. Aspiration pneumonia. Dysphagia secondary to polymyositis and generalized weakness as well as fatigue. Initial speech evaluation with MBS showed that the patient is able to tolerate p.o. diet with soft cervical collar. Currently repeat evaluation shows that the patient is unable to swallow anything safely and therefore currently n.p.o. CT scan showed evidence of right-sided pneumonia. Currently on IV Unasyn and oral azithromycin. Speech therapy following.  5. Recent DVT Diagnosed in October 2021. Started on Eliquis. Recent admission with GI bleed needing EGD and PRBC transfusion. On this admission initially reported melena. GI was consulted. Currently no plan for intervention. Anticoagulation is currently on hold due to concern for acute anemia. Monitor Hb and initiate DVTPx.   6. Urinary retention Monitor for now.Reoccurred.  Due to generalized fatigue. Foley catheter placed. Do no think that the pt has UTI.  7.  Elevated LFT GI felt this could be secondary to  perihepatitis versus autoimmune hepatitis versus part of polymyositis. Currently LFTs are improving significantly. Received vitamin K therapy for elevated INR.  8. Rheumatoid arthritis Holding Plaquenil. Neuro prefers to start methotrexate again.  Currently there was concern about the possibility of infection and therefore initiation of methotrexate is on hold.  9.  Moderate protein calorie malnutrition. Inability to take p.o. Dietary consulted for tube feeding. Corporate consulted. Expected the patient may require core track for a prolonged duration and may even need to consider PEG tube placement. This was discussed with the patient by neurologist as well as myself.  10.  Hyperlipidemia. Statin on hold. Monitor.  11.  Constipation. X-ray abdomen on 07/18/2020 - for any significant stool burden but Continue tube feeding.  12.  Recent hip total arthroplasty on 9/21 Methotrexate dose was reduced at the time of the discharge. At present once patient is able to recover of infection need to resume home dose of methotrexate 25 mg daily instead of 2.5 mg daily.  13.  Type 2 diabetes mellitus, uncontrolled with hyperglycemia. Complications with diabetic neuropathy and hyperlipidemia. Hemoglobin A1c 6.1 on 10/6. Currently blood sugar elevated secondary to steroids. Continue every 4 hours sliding regimen as well as increase the dose of the Lantus.  14.  Hypernatremia. Likely from intravascular dehydration also poor p.o. intake. Continue free water.  Monitor BMP frequently.  15.  Hypokalemia. Treated.   Body mass index is 24.77 kg/m.  Nutrition Problem: Moderate Malnutrition Etiology: acute illness (rhabdomyolysis, polymyositis) Interventions: Interventions: Tube feeding     Diet: On tube feeding DVT Prophylaxis:   heparin injection 5,000 Units Start: 07/18/20 1400 Place and maintain sequential compression device Start: 07/14/20 1122    Advance goals of care discussion:  DNR  Family Communication: family was present at bedside, at the time of interview.  The pt provided permission to discuss medical plan with the family. Opportunity was given to ask question and all questions were answered satisfactorily.   Disposition:  Status is: Inpatient  Remains inpatient appropriate because:Ongoing diagnostic testing needed not appropriate for outpatient work up and Inpatient level of care appropriate due to severity of illness   Dispo: The patient is from: Home              Anticipated d/c is to: SNF              Anticipated d/c date is: > 3 days              Patient currently is not medically stable to d/c.  Subjective: Continues to have difficulty swallowing.  Reports some chest pain and cough.  Reports some fatigue and tiredness. Not passing gas.  No BM.  Physical Exam:  General: Appear in mild distress, no Rash; Oral Mucosa Clear, moist. no Abnormal Neck Mass Or lumps, Conjunctiva normal  Cardiovascular: S1 and S2 Present, no Murmur, Respiratory: increased respiratory effort, Bilateral Air entry present and right more than left crackles, no wheezes Abdomen: Bowel Sound present, Soft and no tenderness Extremities: bilateral  Pedal edema Neurology: alert and oriented to time, place, and person affect appropriate. no new focal deficit generalized weakness, unable to lift extremities against gravity.  Proximal muscle weakness primarily. Gait not checked due to patient safety concerns  Vitals:   07/18/20 0400 07/18/20 0800 07/18/20 1019 07/18/20 1200  BP: 126/72 (!) 126/49 130/85 127/64  Pulse: 69 (!) 124 69 60  Resp: (!) _0 Temp: 98 F (36.7 C) (!) 97.5 F (36.4 C)  97.6 F (36.4 C)  TempSrc: Oral Oral  Oral  SpO2: 100%     Weight:      Height:        Intake/Output Summary (Last 24 hours) at 07/18/2020 1514 Last data filed at 07/18/2020 1021 Gross per 24 hour  Intake 1106.67 ml  Output 825 ml  Net 281.67 ml   Filed Weights   07/16/20  0430 07/16/20 0800 07/17/20 0327  Weight: 70 kg 67 kg 69.6 kg    Data Reviewed: I have personally reviewed and interpreted daily labs, tele strips, imagings as discussed above. I reviewed all nursing notes, pharmacy notes, vitals, pertinent old records I have discussed plan of care as described above with RN and patient/family.  CBC: Recent Labs  Lab 07/16/20 0438 07/16/20 0438 07/16/20 1810 07/16/20 2100 07/17/20 0612 07/17/20 0900 07/18/20 0420  WBC 30.1*  --  22.7*  --  21.8* 21.1* 25.8*  HGB 7.7*   < > 6.7* 6.4* 9.3* 9.5* 10.1*  HCT 24.5*   < > 21.7* 20.8* 29.1* 29.6* 31.5*  MCV 106.1*  --  108.5*  --  97.7 98.3 99.1  PLT 187  --  143*  --  128* 131* 130*   < > = values in this interval not displayed.   Basic Metabolic Panel: Recent Labs  Lab 07/16/20 0438 07/16/20 0902 07/16/20 1810 07/17/20 0900 07/17/20 1815 07/18/20 0420 07/18/20 1220  NA   < >  --  147* 152* 149* 153* 154*  K   < >  --  3.4* 2.6* 3.2* 4.0 3.7  CL   < >  --  107 110 110 114* 113*  CO2   < >  --  _0 GLUCOSE   < >  --  192* 250* 321* 421* 384*  BUN   < >  --  38* 41* 43* 47* 45*  CREATININE   < >  --  0.63 0.64 0.65 0.63 0.61  CALCIUM   < >  --  8.3* 8.5* 8.2* 8.2* 8.1*  MG  --  1.9 2.4  --  2.1  --   --   PHOS  --   --   --  3.9  --   --   --    < > = values in this interval not displayed.    Studies: DG CHEST PORT 1 VIEW  Result Date: 07/18/2020 CLINICAL DATA:  Shortness of breath EXAM: PORTABLE CHEST 1 VIEW COMPARISON:  07/15/2020 FINDINGS: Cardiac shadow is stable. Postsurgical changes are noted. Aortic calcifications are seen. Feeding catheter extends at least into the distal stomach. Significant increased airspace opacity is noted in the right base consistent with acute pneumonia. No sizable effusion is seen. No bony abnormality is noted. IMPRESSION: Findings consistent with acute right basilar pneumonia. Electronically Signed   By: Inez Catalina M.D.   On: 07/18/2020 13:57    DG Abd Portable 1V  Result Date: 07/18/2020 CLINICAL DATA:  Constipation, check feeding catheter placement EXAM: PORTABLE ABDOMEN - 1 VIEW COMPARISON:  07/17/2019 FINDINGS: Feeding catheter is noted within the distal stomach/proximal duodenum. Contrast material is noted throughout the colon similar to that seen on the prior exam. Postsurgical changes are noted. No significant findings of constipation are seen. No free air is noted. IMPRESSION: Feeding catheter as described. No other focal abnormality is seen. Electronically Signed   By: Inez Catalina M.D.   On: 07/18/2020 13:58    Scheduled Meds:  azithromycin  500 mg Per Tube Daily   chlorhexidine  15 mL Mouth Rinse BID   Chlorhexidine Gluconate Cloth  6 each Topical Daily   feeding supplement (PROSource TF)  45 mL Per Tube BID   free water  200 mL Per Tube Q4H   furosemide  40 mg Intravenous Daily   gabapentin  300 mg Per Tube BID   guaiFENesin  10 mL Per Tube QID   heparin injection (subcutaneous)  5,000 Units Subcutaneous Q8H   insulin aspart  0-15 Units Subcutaneous Q4H   insulin glargine  12 Units Subcutaneous QHS   liver oil-zinc oxide  1 application Topical TID   magic mouthwash  5 mL Oral QID   mouth rinse  15 mL Mouth Rinse q12n4p   methylPREDNISolone (SOLU-MEDROL) injection  60 mg Intravenous BID   Followed by   Derrill Memo ON 07/20/2020] methylPREDNISolone (SOLU-MEDROL) injection  60 mg Intravenous Daily   Followed by   Derrill Memo ON 07/23/2020] predniSONE  50 mg Per Tube Q breakfast   Followed by   Derrill Memo ON 07/26/2020] predniSONE  40 mg Per Tube Q breakfast   Followed by   Derrill Memo ON 07/29/2020] predniSONE  30 mg Per Tube Q breakfast   Followed by   Derrill Memo ON 08/01/2020] predniSONE  20 mg Per Tube Q breakfast   metoprolol tartrate  12.5 mg Per Tube BID   pantoprazole sodium  40 mg Per Tube BID   sodium chloride flush  10-40 mL Intracatheter Q12H   Continuous Infusions:  ampicillin-sulbactam (UNASYN)  IV 3 g (07/18/20 1035)   feeding supplement (OSMOLITE 1.2 CAL) 1,000 mL (07/18/20 1329)   PRN Meds: acetaminophen (TYLENOL) oral liquid 160 mg/5 mL, [DISCONTINUED] acetaminophen **OR** acetaminophen, HYDROcodone-acetaminophen, metoprolol tartrate, morphine injection, ondansetron **OR** ondansetron (ZOFRAN) IV, phenol, sodium chloride flush  Time spent: 35 minutes  Author: Berle Mull, MD [facility] 07/18/2020 3:14 PM  To reach On-call, see care teams to locate the attending and reach out via [email or web address]. Between 7PM-7AM, please contact night-coverage If you still have difficulty reaching the attending provider, please page the [facility] (Director on Call) for [facility] on amion for assistance.

## 2020-07-19 ENCOUNTER — Inpatient Hospital Stay (HOSPITAL_COMMUNITY): Payer: Medicare Other

## 2020-07-19 DIAGNOSIS — R29898 Other symptoms and signs involving the musculoskeletal system: Secondary | ICD-10-CM | POA: Diagnosis not present

## 2020-07-19 DIAGNOSIS — I25810 Atherosclerosis of coronary artery bypass graft(s) without angina pectoris: Secondary | ICD-10-CM | POA: Diagnosis not present

## 2020-07-19 DIAGNOSIS — K5901 Slow transit constipation: Secondary | ICD-10-CM | POA: Diagnosis not present

## 2020-07-19 DIAGNOSIS — R52 Pain, unspecified: Secondary | ICD-10-CM | POA: Diagnosis not present

## 2020-07-19 DIAGNOSIS — R531 Weakness: Secondary | ICD-10-CM | POA: Diagnosis not present

## 2020-07-19 DIAGNOSIS — E440 Moderate protein-calorie malnutrition: Secondary | ICD-10-CM

## 2020-07-19 DIAGNOSIS — I5032 Chronic diastolic (congestive) heart failure: Secondary | ICD-10-CM | POA: Diagnosis not present

## 2020-07-19 DIAGNOSIS — E139 Other specified diabetes mellitus without complications: Secondary | ICD-10-CM | POA: Diagnosis not present

## 2020-07-19 LAB — MISC LABCORP TEST (SEND OUT): Labcorp test code: 520057

## 2020-07-19 LAB — COMPREHENSIVE METABOLIC PANEL
ALT: 221 U/L — ABNORMAL HIGH (ref 0–44)
AST: 39 U/L (ref 15–41)
Albumin: 2.3 g/dL — ABNORMAL LOW (ref 3.5–5.0)
Alkaline Phosphatase: 54 U/L (ref 38–126)
Anion gap: 10 (ref 5–15)
BUN: 46 mg/dL — ABNORMAL HIGH (ref 8–23)
CO2: 32 mmol/L (ref 22–32)
Calcium: 8 mg/dL — ABNORMAL LOW (ref 8.9–10.3)
Chloride: 110 mmol/L (ref 98–111)
Creatinine, Ser: 0.46 mg/dL (ref 0.44–1.00)
GFR, Estimated: >60 mL/min (ref >60)
Glucose, Bld: 378 mg/dL — ABNORMAL HIGH (ref 70–99)
Potassium: 3.2 mmol/L — ABNORMAL LOW (ref 3.5–5.1)
Sodium: 152 mmol/L — ABNORMAL HIGH (ref 135–145)
Total Bilirubin: 0.9 mg/dL (ref 0.3–1.2)
Total Protein: 4.5 g/dL — ABNORMAL LOW (ref 6.5–8.1)

## 2020-07-19 LAB — CBC
HCT: 31.2 % — ABNORMAL LOW (ref 36.0–46.0)
Hemoglobin: 9.8 g/dL — ABNORMAL LOW (ref 12.0–15.0)
MCH: 31.6 pg (ref 26.0–34.0)
MCHC: 31.4 g/dL (ref 30.0–36.0)
MCV: 100.6 fL — ABNORMAL HIGH (ref 80.0–100.0)
Platelets: 115 10*3/uL — ABNORMAL LOW (ref 150–400)
RBC: 3.1 MIL/uL — ABNORMAL LOW (ref 3.87–5.11)
RDW: 22.7 % — ABNORMAL HIGH (ref 11.5–15.5)
WBC: 19.5 10*3/uL — ABNORMAL HIGH (ref 4.0–10.5)
nRBC: 0.2 % (ref 0.0–0.2)

## 2020-07-19 LAB — GLUCOSE, CAPILLARY
Glucose-Capillary: 247 mg/dL — ABNORMAL HIGH (ref 70–99)
Glucose-Capillary: 278 mg/dL — ABNORMAL HIGH (ref 70–99)
Glucose-Capillary: 306 mg/dL — ABNORMAL HIGH (ref 70–99)
Glucose-Capillary: 312 mg/dL — ABNORMAL HIGH (ref 70–99)
Glucose-Capillary: 330 mg/dL — ABNORMAL HIGH (ref 70–99)
Glucose-Capillary: 344 mg/dL — ABNORMAL HIGH (ref 70–99)
Glucose-Capillary: 374 mg/dL — ABNORMAL HIGH (ref 70–99)

## 2020-07-19 LAB — MYOGLOBIN, SERUM: Myoglobin: 2121 ng/mL — ABNORMAL HIGH (ref 25–58)

## 2020-07-19 LAB — CK: Total CK: 622 U/L — ABNORMAL HIGH (ref 38–234)

## 2020-07-19 LAB — PHOSPHORUS: Phosphorus: 1.9 mg/dL — ABNORMAL LOW (ref 2.5–4.6)

## 2020-07-19 MED ORDER — SCOPOLAMINE 1 MG/3DAYS TD PT72
1.0000 | MEDICATED_PATCH | TRANSDERMAL | Status: DC
  Administered 2020-07-19 – 2020-07-28 (×4): 1.5 mg via TRANSDERMAL
  Filled 2020-07-19 (×4): qty 1

## 2020-07-19 MED ORDER — ALBUTEROL SULFATE (2.5 MG/3ML) 0.083% IN NEBU
2.5000 mg | INHALATION_SOLUTION | RESPIRATORY_TRACT | Status: DC | PRN
  Administered 2020-07-19 – 2020-07-20 (×3): 2.5 mg via RESPIRATORY_TRACT
  Filled 2020-07-19 (×3): qty 3

## 2020-07-19 MED ORDER — INSULIN ASPART 100 UNIT/ML ~~LOC~~ SOLN
3.0000 [IU] | SUBCUTANEOUS | Status: DC
  Administered 2020-07-19 – 2020-07-20 (×7): 3 [IU] via SUBCUTANEOUS

## 2020-07-19 MED ORDER — INSULIN GLARGINE 100 UNIT/ML ~~LOC~~ SOLN
20.0000 [IU] | Freq: Every day | SUBCUTANEOUS | Status: DC
  Administered 2020-07-20: 20 [IU] via SUBCUTANEOUS
  Filled 2020-07-19 (×3): qty 0.2

## 2020-07-19 MED ORDER — ACETYLCYSTEINE 20 % IN SOLN
4.0000 mL | Freq: Three times a day (TID) | RESPIRATORY_TRACT | Status: DC
  Administered 2020-07-19 – 2020-07-20 (×3): 4 mL via RESPIRATORY_TRACT
  Filled 2020-07-19 (×5): qty 4

## 2020-07-19 MED ORDER — POTASSIUM CHLORIDE 20 MEQ PO PACK
40.0000 meq | PACK | ORAL | Status: AC
  Administered 2020-07-19 (×2): 40 meq via NASOGASTRIC
  Filled 2020-07-19 (×2): qty 2

## 2020-07-19 NOTE — Progress Notes (Signed)
   07/15/20 1300  Assess: MEWS Score  BP (!) 56/35  Pulse Rate 71  ECG Heart Rate 72  Resp (!) 25  SpO2 (!) 89 %  Assess: MEWS Score  MEWS Temp 0  MEWS Systolic 3  MEWS Pulse 0  MEWS RR 1  MEWS LOC 0  MEWS Score 4  MEWS Score Color Red  Assess: if the MEWS score is Yellow or Red  Were vital signs taken at a resting state? Yes  Focused Assessment No change from prior assessment  Early Detection of Sepsis Score *See Row Information* Low  MEWS guidelines implemented *See Row Information* No, vital signs rechecked  Treat  MEWS Interventions Other (Comment) (BP cuff adjusted, SPO2 moved to opposite hand)  Document  Progress note created (see row info) Yes

## 2020-07-19 NOTE — Progress Notes (Signed)
Occupational Therapy Treatment Patient Details Name: Patricia Villegas MRN: [id number] DOB: [birth year] Today's Date: 07/19/2020    History of present illness Pt is an 85 y.o. female admitted 07/30/2020 with leg pain and worsening weakness. Workup for severe rhabdomyolysis, presumed myositis. PMH includes RA, [name], DM, HTN, CAD s/p CABG, R THA (direct anterior 05/2020). Of note, 2x recent admissions 06/2020 with weakness, DVT, subacute L1 compression fx.   OT comments  Pt received awake and alert, during breathing treatment for respiratory with husband at bedside. Pt agreeable to OT session to address PROM and repositioning. RN advised to limit session to bed level activity due to increased edema, congestion, and pneumonia. Pt instructed to look toward L side, however, limited due to complaints of pain and weakness of neck. Rolled blanket behind pillow on R side of neck to present to neutral position. Instructed to perform hand pumps of opposition to improve ROM of digits. Chest press while in chair position with active assist from OT and given visual cue of reaching toward clock to facilitate further reach. Pt observed to had some redness on B heels, however, declines use of prevalon boots for protection. OT will continue to follow address established deficits to increase endurance, strength, and compensatory techs for engagement of ADLs.  DC and Freq remains the same.    Follow Up Recommendations  SNF;Supervision/Assistance - 24 hour    Equipment Recommendations  Wheelchair (measurements OT);Wheelchair cushion (measurements OT);Hospital bed    Recommendations for Other Services      Precautions / Restrictions Precautions Precautions: Fall Precaution Comments: back precautions for pain control due to L1 subacute compression fx; recent R THA 05/2020 (direct anterior, WBAT) Required Braces or Orthoses: Cervical Brace Cervical Brace: Soft collar (when eating to decrease risk for aspiration.)        Mobility Bed Mobility Overal bed mobility: Needs Assistance Bed Mobility: Rolling           General bed mobility comments: No active bed mobility performed, pt presented toward [facility] with assistance from bed pad with total A +2  Transfers Overall transfer level: Needs assistance Equipment used: Rolling walker (2 wheeled);1 person hand held assist             General transfer comment: functional transfer deferred due to due to current limitation reported from RN, refer to general ADL comment.    Balance Overall balance assessment: Needs assistance Sitting-balance support: Bilateral upper extremity supported;Feet supported Sitting balance-Leahy Scale: Zero   Postural control: Posterior lean Standing balance support: Bilateral upper extremity supported Standing balance-Leahy Scale: Zero                             ADL either performed or assessed with clinical judgement   ADL Overall ADL's : Needs assistance/impaired Eating/Feeding: Bed level;Total assistance (nasaltube feeding)                                   Functional mobility during ADLs:  (able to stand with mod A however Max A to take 1 side step) General ADL Comments: Session limited to mostly PROM of BUE and neck due to increased edema, congestion, and fluid per RN. Advised to restrict to mostly bed level engagement and agreeable to chair positioning in bed.      Vision   Vision Assessment?: No apparent visual deficits   Perception  Praxis      Cognition Arousal/Alertness: Awake/alert Behavior During Therapy: WFL for tasks assessed/performed Overall Cognitive Status: Impaired/Different from baseline                                 General Comments: most likely baseline memory impairment; likely delrium during session, pt reports seeing a man in the corner moving around.        Exercises Exercises: General Upper Extremity;General Lower Extremity Total  Joint Exercises Ankle Circles/Pumps: Both;10 reps;Supine;AROM (mostly PROM of R ankle.) Other Exercises Other Exercises: Chest press. active assist from OT with visual and verbal aide to reach toward targeted goal.    Shoulder Instructions       General Comments BUE and BLE pitting edema observed, with redness from pressure of heels, informed RN and advised to don prevalon boot to decreased pressure sore, however, pt declined.    Pertinent Vitals/ Pain       Pain Assessment: Faces Faces Pain Scale: No hurt Pain Location: All over, especially BUEs, BLEs, R hip, back, and neck Pain Descriptors / Indicators: Discomfort;Grimacing;Guarding Pain Intervention(s): Monitored during session;Limited activity within patient's tolerance;Repositioned  Home Living                                          Prior Functioning/Environment              Frequency  Min 2X/week        Progress Toward Goals  OT Goals(current goals can now be found in the care plan section)  Progress towards OT goals: Not progressing toward goals - comment (declined as far as fatigue and weakness)  Acute Rehab OT Goals Patient Stated Goal: go to rehab, be able to stand and walk again OT Goal Formulation: With patient/family Time For Goal Achievement: 2020-08-13 Potential to Achieve Goals: Good ADL Goals Pt Will Perform Eating: with min assist;bed level Pt Will Perform Grooming: with min assist;bed level Pt Will Perform Upper Body Bathing: with mod assist;bed level Pt Will Perform Lower Body Bathing: with min assist;sit to/from stand Pt Will Perform Upper Body Dressing: with mod assist;bed level Pt Will Perform Lower Body Dressing: with min assist;sit to/from stand;with adaptive equipment Pt Will Transfer to Toilet: with min assist;ambulating;bedside commode Pt Will Perform Toileting - Clothing Manipulation and hygiene: sit to/from stand;with min assist Pt Will Perform Tub/Shower Transfer:  with modified independence;ambulating Pt/caregiver will Perform Home Exercise Program: Increased ROM;Increased strength;Both right and left upper extremity;With Supervision;With written HEP provided Additional ADL Goal #1: Pt to demonstrate ability to sit EOB for >3 minutes with no more than Mod A to increase participation in ADL tasks.  Plan Discharge plan remains appropriate    Co-evaluation                 AM-PAC OT "6 Clicks" Daily Activity     Outcome Measure   Help from another person eating meals?: Total Help from another person taking care of personal grooming?: Total Help from another person toileting, which includes using toliet, bedpan, or urinal?: Total Help from another person bathing (including washing, rinsing, drying)?: Total Help from another person to put on and taking off regular upper body clothing?: Total Help from another person to put on and taking off regular lower body clothing?: Total 6 Click Score: 6  End of Session Equipment Utilized During Treatment: Oxygen (3L O2 99% with humidifier)  OT Visit Diagnosis: Unsteadiness on feet (R26.81);Other abnormalities of gait and mobility (R26.89);History of falling (Z91.81);Muscle weakness (generalized) (M62.81);Pain;Dizziness and giddiness (R42) Pain - Right/Left: Right Pain - part of body: Hip   Activity Tolerance Patient limited by fatigue   Patient Left in bed;with call bell/phone within reach;with bed alarm set;with family/visitor present   Nurse Communication Mobility status;Need for lift equipment        Time: 1350-1420 OT Time Calculation (min): 30 min  Charges: OT General Charges $OT Visit: 1 Visit OT Treatments $Therapeutic Exercise: 23-37 mins  Minus Breeding, MSOT, OTR/L  Supplemental Rehabilitation Services  [phone]    Marius Ditch 07/19/2020, 3:37 PM

## 2020-07-19 NOTE — Progress Notes (Signed)
  Speech Language Pathology Treatment: Dysphagia  Patient Details Name: Patricia Villegas MRN: [id number] DOB: Feb 28, 1935 Today's Date: 07/19/2020 Time: 4174-0814 SLP Time Calculation (min) (ACUTE ONLY): 19 min  Assessment / Plan / Recommendation Clinical Impression  Pt was seen for dysphagia treatment. She was alert and cooperative throughout the session with no c/o pain. She reported that she was very tired and initially refused p.o. trials. Oral care was provided by SLP and pt consumed trace thin liquids without overt s/sx of aspiration. No s/sx of aspiration were noted with the initial 3-4 boluses of ice chips but coughing was noted thereafter. She exhibited 5-6 swallows per half-tsp boluses of puree solids, suggesting pharyngeal residue and coughing was consistently observed, indicating possible aspiration of residue. Throat clearing was observed with thin liquids via tsp. Pt's overall swallow function appears worse than when she was last seen by this SLP. SLP is in agreement with discussion with family on goals of care to help guide intervention.    HPI HPI: Pt is an 85 y.o. female with medical history significant of RA, [name], DM, HTN, [name], and CAD s/p CABG who presented with leg pain.  She had physical therapy sessions and became very weak came back to the hospital was found to have a bleeding ulcer and a DVT. Per neurology's note, pt has had an insidious slow progressive onset of weakness for 3-4 weeks concerning for myopathy. SLP was consulted since dysphagia may be related to myopathy.  In evening of 11/5 Rapid Response was called due to patient c/o SOB and increased WOB but no acute interventions were required as patient's oxygen saturation was 94% on 2L nasal cannula and RR was 18-20. Rapid Response called again on 11/6 due to same reasons and at this time, patient was on 6L oxygen via nasal cannula which was then increased to 8L. She was transferred to higher level of care on 2C. SLP ordered for  repeat BSE due to these changes. CXR 11/10: Similar right basilar airspace opacities, compatible with pneumonia and/or aspiration      SLP Plan  Continue with current plan of care       Recommendations  Diet recommendations: NPO (pt may have limited (3-4/hr) ice chips following oral care) Medication Administration: Via alternative means Postural Changes and/or Swallow Maneuvers: Seated upright 90 degrees                Oral Care Recommendations: Oral care BID;Staff/trained caregiver to provide oral care Follow up Recommendations:  (TBD) SLP Visit Diagnosis: Dysphagia, oropharyngeal phase (R13.12) Plan: Continue with current plan of care       Laylani Pudwill I. Hardin Negus, [place], [place] Office number [phone] Pager [place] 07/19/2020, 4:26 PM

## 2020-07-19 NOTE — Plan of Care (Signed)
  Problem: Health Behavior/Discharge Planning: Goal: Ability to manage health-related needs will improve Outcome: Progressing   Problem: Activity: Goal: Risk for activity intolerance will decrease Outcome: Progressing   Problem: Nutrition: Goal: Adequate nutrition will be maintained Outcome: Progressing   Problem: Coping: Goal: Level of anxiety will decrease Outcome: Progressing   Problem: Elimination: Goal: Will not experience complications related to bowel motility Outcome: Progressing Goal: Will not experience complications related to urinary retention Outcome: Progressing   Problem: Pain Managment: Goal: General experience of comfort will improve Outcome: Progressing   Problem: Safety: Goal: Ability to remain free from injury will improve Outcome: Progressing   

## 2020-07-19 NOTE — Progress Notes (Addendum)
PROGRESS NOTE    Patricia Villegas  IWP:[id number] DOB: 13-Feb-1935 DOA: 07/30/2020 PCP: Haywood Pao, MD    Brief Narrative:  Patricia Villegas is a 85 year old female with past medical history remarkable for rheumatoid arthritis, peripheral vascular disease, type 2 diabetes mellitus, essential hypertension, hyperlipidemia, CAD s/p CABG who presented to the ED with leg pain. Patient was noted to have severe rhabdomyolysis, anemia and transaminitis. Neurology and GI were consulted. Hospitalist service consulted for admission.  Significant events: 11/2 admitted for rhabdomyolysis started on IV fluids. 11/4-11/8 IV Solu-Medrol 1 g. 11/6 transferred to progressive care unit due to worsening respiratory distress. 11/7 GI signed off.  No intervention planned.  Continue PPI twice daily. 11/8 cortrack inserted 11/9 CXR with RLL infiltrate consistent with aspiration, started on azithromycin/Unasyn   Assessment & Plan:   Principal Problem:   Rhabdomyolysis Active Problems:   Essential hypertension   Mixed hyperlipidemia   Peripheral vascular disease ([place])   CAD (coronary artery disease) of artery bypass graft   Rheumatoid arthritis (HCC)   Chronic diastolic CHF (congestive heart failure) ([place])   Type 2 diabetes mellitus with diabetic polyneuropathy, with long-term current use of insulin (HCC)   Myositis   Constipation   LFT elevation   Malnutrition of moderate degree  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 CK level peaked at 31,948. ESR 69. Adolase 77 (high), LDH 730. CMV negative. EBV IgG elevated 581 and EBV IgM <36.0. HSV/RSV negative. Ceruloplasm normal.  Alpha-1 antitrypsin normal. ANA negative. Mitochondial ab 24.4; equivocal. AsMA negative.  Acetylcholine receptor antibody negative. Anti-HMGCR Ab negative.  --Continue to hold Plaquenil, statin, Protonix --s/p  Solu-Medrol IV 1 g x 5 days; continue steroid taper; currently on Solumedrol 60IV BID --CK 31,948>>>622 --Myoglobin >30K>>>2121 --Anti-Hmg-CoA antibody, myositis panel/MyoMarker 3 Profile: pending --Pain control gabapentin 351m BID --Continue PT/OT efforts --Continue to hold methotrexate 221mweekly (Sun), given aspiration pneumonia --Palliative care consulted for assistance with goals of care/medical decision making --Continue supportive care  Elevated LFTs: resolved Acute hepatitis panel nonreactive.  GGT 17, within normal limits.  Etiology likely secondary to perihepatitis from autoimmune/inflammatory myositis.  Ultrasound with Doppler unrevealing. --AST 525>>>34 --ALT 252>>>24 --Holding statin --Continue monitor CMP daily  Acute hypoxic respiratory failure Aspiration pneumonia Etiology likely secondary to severe muscle dysfunction from underlying rhabdomyolysis causing severe dysphagia.  Chest x-ray 07/18/2020 with significant infiltrate right lower lobe consistent with aspiration. --WBC 15.5>>37.5>>25.8>19.5 --Continue empiric antibiotics with azithromycin/Unasyn --RT consult, albuterol neb prn, Mucomyst neb --Scopolamine patch --Titrate to maintain SPO2 greater than 92%, currently on 3 L nasal cannula with SPO2 100% --follow CBC daily  Dysphagia EGD 07/18/2020 with no findings for esophageal stricture.  Speech therapy following, currently n.p.o. with core track feeding tube in place. --Continue tube feeds per nutrition --If swallowing does not improve, likely will need PEG --Palliative care for assistance with goals of care  Acute on chronic iron deficiency anemia with melena Recent H. pylori infection, s/p treatment Recent EGD 06/17/2020 by [facility] with 2 nonbleeding duodenal ulcers with biopsy positive for H. pylori.  Completed antibiotic treatment.  Ferritin level normal, iron level 11 (low), elevated reticulocyte count, B12 608, folate 18, Coombs test  negative. --Discontinued home Eliquis --s/p 2u pRBC 07/16/2020 --Hgb 8.8>>6.4>>9.3>9.5>10.1>9.8; now stable --Protonix 4061mID --GI signed off 11/7  Acute on chronic diastolic congestive heart failure Initially treated with IV albumin followed by IV furosemide.  TTE 07/02/2020 with LVEF 60-50-53%rade 1 diastolic dysfunction. --  net positive 971m past 24h and net positive 2.1L since admission --Furosemide 40 mg IV daily --Strict I's and O's and daily weights  Hypernatremia Etiology likely secondary to poor oral intake/dehydration from dysphagia. --Na 154>152 --Continue tube feeds with free water flushes per nutrition --CMP daily  Recent LLE DVT Vascular duplex ultrasound 07/03/2020 with findings consistent with acute deep vein thrombosis involving the left posterior tibial veins, left peroneal veins, and left gastrocnemius veins; repeat vascular ultrasound 07/12/2020 with findings of persistent DVT. --Holding Eliquis for anemia --On heparin for DVT ppx --Follow hemoglobin closely daily; if remains stable may consider restarting Eliquis  Type 2 diabetes mellitus Hemoglobin A1c 6.1, well controlled.  Home regimen includes Lantus 10 units subcutaneously daily, NovoLog sliding scale. --Lantus 20u [place] daily  --Novolog 3u [place] q4h --ISS for further coverage  Hyperlipidemia --Holding home simvastatin 80 mg PO QHS 2/2 myositis as above  Rheumatoid arthritis On prednisone 5 mg p.o. daily, Plaquenil 200 mg p.o. twice daily, methotrexate 238mweekly (Sunday) --On Solu-Medrol as above --Holding Plaquenil and methotrexate secondary to myositis/aspiration pneumonia  CAD s/p CABG Essential Hypertension --Metoprolol tartrate 12.75m67mID  [name] --Holding home simvastatin 80 mg PO QHS 2/2 myositis as above    DVT prophylaxis: Heparin Code Status: DNR Family Communication: Updated patient's husband present at bedside extensively this morning  Disposition Plan:  Status is:  Inpatient  Remains inpatient appropriate because:Persistent severe electrolyte disturbances, Ongoing active pain requiring inpatient pain management, Ongoing diagnostic testing needed not appropriate for outpatient work up, Unsafe d/c plan, IV treatments appropriate due to intensity of illness or inability to take PO and Inpatient level of care appropriate due to severity of illness   Dispo: The patient is from: Home              Anticipated d/c is to: SNF              Anticipated d/c date is: > 3 days              Patient currently is not medically stable to d/c.    Consultants:   [facility] - signed off 11/7  Palliative care  Procedures:   Core track placement  Antimicrobials:   Azithromycin 11/9>>  Unasyn 11/9>>   Subjective: Patient seen and examined bedside, lying in bed.  Continues to complain of diffuse body aches.  Husband present, updated extensively regarding her severe illness.  Discussed with him regarding aspiration pneumonia on the right and continued dysphagia and possible need for PEG placement if continues to pursue aggressive measures, which he was in favor of.  No other questions or concerns at this time.  Patient denies headache, no dizziness, no chest pain, no abdominal pain.  No other acute concerns per nursing staff this morning, other than her coarse breath sounds.  Objective: Vitals:   07/19/20 0741 07/19/20 0900 07/19/20 0911 07/19/20 1156  BP: (!) 146/64   (!) 143/58  Pulse: 70 72  69  Resp: 15   18  Temp: 97.8 F (36.6 C)   97.9 F (36.6 C)  TempSrc: Oral   Oral  SpO2: 98% 100% 100% 100%  Weight:      Height:        Intake/Output Summary (Last 24 hours) at 07/19/2020 1221 Last data filed at 07/19/2020 0815 Gross per 24 hour  Intake 2559.45 ml  Output 2275 ml  Net 284.45 ml   Filed Weights   07/16/20 0800 07/17/20 0327 07/19/20 0334  Weight: 67 kg 69.6  kg 70.9 kg    Examination:  General exam: Appears calm and comfortable;  chronically ill/frail in appearance Respiratory system: Coarse breath sounds bilaterally, noted apneic episodes while sleeping, decreased breath sounds bases with crackles; on 3 L nasal cannula with SPO2 100% (not on home O2) Cardiovascular system: S1 & S2 heard, RRR. No JVD, murmurs, rubs, gallops or clicks. No pedal edema. Gastrointestinal system: Abdomen is nondistended, soft and nontender. No organomegaly or masses felt. Normal bowel sounds heard.  Core track tube noted with tube feeds infusing Central nervous system: Alert, oriented to person ([place]), Place (cone), but not time (1992). No focal neurological deficits. Extremities: Symmetric 5 x 5 power. Skin: No rashes, lesions or ulcers Psychiatry: Judgement and insight appear poor.  Depressed mood & flat affect.     Data Reviewed: I have personally reviewed following labs and imaging studies  CBC: Recent Labs  Lab 07/16/20 1810 07/16/20 1810 07/16/20 2100 07/17/20 0612 07/17/20 0900 07/18/20 0420 07/19/20 0222  WBC 22.7*  --   --  21.8* 21.1* 25.8* 19.5*  HGB 6.7*   < > 6.4* 9.3* 9.5* 10.1* 9.8*  HCT 21.7*   < > 20.8* 29.1* 29.6* 31.5* 31.2*  MCV 108.5*  --   --  97.7 98.3 99.1 100.6*  PLT 143*  --   --  128* 131* 130* 115*   < > = values in this interval not displayed.   Basic Metabolic Panel: Recent Labs  Lab 07/16/20 0438 07/16/20 0902 07/16/20 1810 07/16/20 1810 07/17/20 0900 07/17/20 0900 07/17/20 1815 07/18/20 0420 07/18/20 1220 07/18/20 2105 07/19/20 0222  NA   < >  --  147*   < > 152*   < > 149* 153* 154* 154* 152*  K   < >  --  3.4*   < > 2.6*   < > 3.2* 4.0 3.7 3.1* 3.2*  CL   < >  --  107   < > 110   < > 110 114* 113* 110 110  CO2   < >  --  26   < > 27   < > 26 30 30  32 32  GLUCOSE   < >  --  192*   < > 250*   < > 321* 421* 384* 368* 378*  BUN   < >  --  38*   < > 41*   < > 43* 47* 45* 45* 46*  CREATININE   < >  --  0.63   < > 0.64   < > 0.65 0.63 0.61 0.55 0.46  CALCIUM   < >  --  8.3*   < > 8.5*    < > 8.2* 8.2* 8.1* 8.0* 8.0*  MG  --  1.9 2.4  --   --   --  2.1  --   --   --   --   PHOS  --   --   --   --  3.9  --   --   --   --   --  1.9*   < > = values in this interval not displayed.   GFR: Estimated Creatinine Clearance: 49.9 mL/min (by C-G formula based on SCr of 0.46 mg/dL). Liver Function Tests: Recent Labs  Lab 07/15/20 0529 07/16/20 0438 07/17/20 0900 07/18/20 0420 07/19/20 0222  AST 423* 193* 94* 58* 39  ALT 557* 422* 294* 264* 221*  ALKPHOS 56 54 48 53 54  BILITOT 0.6 1.2 1.9* 0.8 0.9  PROT 4.5* 4.7* 4.7* 4.7* 4.5*  ALBUMIN 1.9* 2.6* 2.8* 2.6* 2.3*   No results for input(s): LIPASE, AMYLASE in the last 168 hours. Recent Labs  Lab 07/13/20 0437  AMMONIA 10   Coagulation Profile: Recent Labs  Lab 07/13/20 0437 07/14/20 0342 07/15/20 0529  INR 1.2 1.4* 1.3*   Cardiac Enzymes: Recent Labs  Lab 07/15/20 1101 07/16/20 0438 07/17/20 0900 07/18/20 0420 07/19/20 0222  CKTOTAL 13,991* 3,921* 2,316* 1,006* 622*   BNP (last 3 results) Recent Labs    08/31/19 1052  PROBNP 980*   HbA1C: No results for input(s): HGBA1C in the last 72 hours. CBG: Recent Labs  Lab 07/18/20 2000 07/18/20 2329 07/19/20 0331 07/19/20 0751 07/19/20 1158  GLUCAP 328* 372* 330* 312* 247*   Lipid Profile: No results for input(s): CHOL, HDL, LDLCALC, TRIG, CHOLHDL, LDLDIRECT in the last 72 hours. Thyroid Function Tests: No results for input(s): TSH, T4TOTAL, FREET4, T3FREE, THYROIDAB in the last 72 hours. Anemia Panel: Recent Labs    07/16/20 2100  VITAMINB12 608  FOLATE 18.2  FERRITIN 262  TIBC 104*  IRON 11*  RETICCTPCT 3.2*   Sepsis Labs: No results for input(s): PROCALCITON, LATICACIDVEN in the last 168 hours.  Recent Results (from the past 240 hour(s))  Respiratory Panel by RT PCR (Flu A&B, Covid) - Nasopharyngeal Swab     Status: None   Collection Time: 07/10/2020  6:30 AM   Specimen: Nasopharyngeal Swab  Result Value Ref Range Status   SARS  Coronavirus 2 by RT PCR NEGATIVE NEGATIVE Final    Comment: (NOTE) SARS-CoV-2 target nucleic acids are NOT DETECTED.  The SARS-CoV-2 RNA is generally detectable in upper respiratoy specimens during the acute phase of infection. The lowest concentration of SARS-CoV-2 viral copies this assay can detect is 131 copies/mL. A negative result does not preclude SARS-Cov-2 infection and should not be used as the sole basis for treatment or other patient management decisions. A negative result may occur with  improper specimen collection/handling, submission of specimen other than nasopharyngeal swab, presence of viral mutation(s) within the areas targeted by this assay, and inadequate number of viral copies (<131 copies/mL). A negative result must be combined with clinical observations, patient history, and epidemiological information. The expected result is Negative.  Fact Sheet for Patients:  PinkCheek.be  Fact Sheet for Healthcare Providers:  GravelBags.it  This test is no t yet approved or cleared by the [place] FDA and  has been authorized for detection and/or diagnosis of SARS-CoV-2 by FDA under an Emergency Use Authorization (EUA). This EUA will remain  in effect (meaning this test can be used) for the duration of the COVID-19 declaration under Section 564(b)(1) of the Act, 21 U.S.C. section 360bbb-3(b)(1), unless the authorization is terminated or revoked sooner.     Influenza A by PCR NEGATIVE NEGATIVE Final   Influenza B by PCR NEGATIVE NEGATIVE Final    Comment: (NOTE) The Xpert Xpress SARS-CoV-2/FLU/RSV assay is intended as an aid in  the diagnosis of influenza from Nasopharyngeal swab specimens and  should not be used as a sole basis for treatment. Nasal washings and  aspirates are unacceptable for Xpert Xpress SARS-CoV-2/FLU/RSV  testing.  Fact Sheet for  Patients: PinkCheek.be  Fact Sheet for Healthcare Providers: GravelBags.it  This test is not yet approved or cleared by the [place] FDA and  has been authorized for detection and/or diagnosis of SARS-CoV-2 by  FDA under an Emergency Use Authorization (EUA). This EUA will remain  in effect (meaning  this test can be used) for the duration of the  Covid-19 declaration under Section 564(b)(1) of the Act, 21  U.S.C. section 360bbb-3(b)(1), unless the authorization is  terminated or revoked. Performed at [facility], [place] [address], [place], [place] [address]   Culture, Urine     Status: Abnormal   Collection Time: 07/16/20  4:29 PM   Specimen: Urine, Random  Result Value Ref Range Status   Specimen Description URINE, RANDOM  Final   Special Requests   Final    NONE Performed at [facility], [place] [address] [place], [place] [address]    Culture MULTIPLE SPECIES PRESENT, SUGGEST RECOLLECTION (A)  Final   Report Status 07/17/2020 FINAL  Final         Radiology Studies: DG CHEST PORT 1 VIEW  Result Date: 07/19/2020 CLINICAL DATA:  Shortness of breath. EXAM: PORTABLE CHEST 1 VIEW COMPARISON:  November 9 21 FINDINGS: Similar cardiomediastinal silhouette. Postsurgical change with median sternotomy. Aortic atherosclerosis. Similar positioning of the right PICC. Enteric tube courses below the diaphragm and outside the field of view. Similar right basilar airspace opacities. No acute osseous abnormality. IMPRESSION: Similar right basilar airspace opacities, compatible with pneumonia and/or aspiration. Electronically Signed   By: Margaretha Sheffield MD   On: 07/19/2020 10:11   DG CHEST PORT 1 VIEW  Result Date: 07/18/2020 CLINICAL DATA:  Shortness of breath EXAM: PORTABLE CHEST 1 VIEW COMPARISON:  07/15/2020 FINDINGS: Cardiac shadow is stable. Postsurgical changes are noted. Aortic calcifications are seen. Feeding  catheter extends at least into the distal stomach. Significant increased airspace opacity is noted in the right base consistent with acute pneumonia. No sizable effusion is seen. No bony abnormality is noted. IMPRESSION: Findings consistent with acute right basilar pneumonia. Electronically Signed   By: Inez Catalina M.D.   On: 07/18/2020 13:57   DG Abd Portable 1V  Result Date: 07/18/2020 CLINICAL DATA:  Constipation, check feeding catheter placement EXAM: PORTABLE ABDOMEN - 1 VIEW COMPARISON:  07/17/2019 FINDINGS: Feeding catheter is noted within the distal stomach/proximal duodenum. Contrast material is noted throughout the colon similar to that seen on the prior exam. Postsurgical changes are noted. No significant findings of constipation are seen. No free air is noted. IMPRESSION: Feeding catheter as described. No other focal abnormality is seen. Electronically Signed   By: Inez Catalina M.D.   On: 07/18/2020 13:58        Scheduled Meds: . acetylcysteine  4 mL Nebulization TID  . azithromycin  500 mg Per Tube Daily  . chlorhexidine  15 mL Mouth Rinse BID  . Chlorhexidine Gluconate Cloth  6 each Topical Daily  . feeding supplement (PROSource TF)  45 mL Per Tube BID  . free water  200 mL Per Tube Q4H  . furosemide  40 mg Intravenous Daily  . gabapentin  300 mg Per Tube BID  . guaiFENesin  10 mL Per Tube QID  . heparin injection (subcutaneous)  5,000 Units Subcutaneous Q8H  . insulin aspart  0-15 Units Subcutaneous Q4H  . insulin aspart  3 Units Subcutaneous Q4H  . insulin glargine  20 Units Subcutaneous QHS  . liver oil-zinc oxide  1 application Topical TID  . magic mouthwash  5 mL Oral QID  . mouth rinse  15 mL Mouth Rinse q12n4p  . methylPREDNISolone (SOLU-MEDROL) injection  60 mg Intravenous BID   Followed by  . [START ON 07/20/2020] methylPREDNISolone (SOLU-MEDROL) injection  60 mg Intravenous Daily   Followed by  . [  START ON 07/23/2020] predniSONE  50 mg Per Tube Q breakfast    Followed by  . [START ON 07/26/2020] predniSONE  40 mg Per Tube Q breakfast   Followed by  . [START ON 07/29/2020] predniSONE  30 mg Per Tube Q breakfast   Followed by  . [START ON 08/01/2020] predniSONE  20 mg Per Tube Q breakfast  . metoprolol tartrate  12.5 mg Per Tube BID  . pantoprazole sodium  40 mg Per Tube BID  . potassium chloride  40 mEq Per NG tube Q4H  . scopolamine  1 patch Transdermal Q72H  . sodium chloride flush  10-40 mL Intracatheter Q12H   Continuous Infusions: . ampicillin-sulbactam (UNASYN) IV 3 g (07/19/20 0602)  . feeding supplement (OSMOLITE 1.2 CAL) 55 mL/hr at 07/19/20 0334     LOS: 8 days    Time spent: 42 minutes spent on chart review, discussion with nursing staff, consultants, updating family and interview/physical exam; more than 50% of that time was spent in counseling and/or coordination of care.    Reginae Wolfrey J [place] DO [facility] Available via Epic secure chat 7am-7pm After these hours, please refer to coverage provider listed on [email or web address] 07/19/2020, 12:21 PM

## 2020-07-19 NOTE — Progress Notes (Signed)
   07/16/20 1600  Assess: MEWS Score  Temp 98.8 F (37.1 C)  BP (!) 90/57  Pulse Rate (!) 139  ECG Heart Rate (!) 141  Resp 16  Level of Consciousness Alert  SpO2 100 %  O2 Device Nasal Cannula  O2 Flow Rate (L/min) 3 L/min  Assess: MEWS Score  MEWS Temp 0  MEWS Systolic 1  MEWS Pulse 3  MEWS RR 0  MEWS LOC 0  MEWS Score 4  MEWS Score Color Red  Assess: if the MEWS score is Yellow or Red  Were vital signs taken at a resting state? Yes  Focused Assessment No change from prior assessment  Early Detection of Sepsis Score *See Row Information* Low  MEWS guidelines implemented *See Row Information* No, previously red, continue vital signs every 4 hours  Treat  Pain Score 1  Take Vital Signs  Increase Vital Sign Frequency  Red: Q 1hr X 4 then Q 4hr X 4, if remains red, continue Q 4hrs  Escalate  MEWS: Escalate Red: discuss with charge nurse/RN and provider, consider discussing with RRT  Notify: Provider  Provider Name/Title Patel  Date Provider Notified 07/16/20  Notification Type Call  Notification Reason Other (Comment) (tachycardia)  Response See new orders  Document  Progress note created (see row info) Yes

## 2020-07-20 DIAGNOSIS — Z7189 Other specified counseling: Secondary | ICD-10-CM | POA: Diagnosis not present

## 2020-07-20 DIAGNOSIS — E139 Other specified diabetes mellitus without complications: Secondary | ICD-10-CM | POA: Diagnosis not present

## 2020-07-20 DIAGNOSIS — Z66 Do not resuscitate: Secondary | ICD-10-CM | POA: Diagnosis not present

## 2020-07-20 DIAGNOSIS — I5032 Chronic diastolic (congestive) heart failure: Secondary | ICD-10-CM | POA: Diagnosis not present

## 2020-07-20 DIAGNOSIS — Z515 Encounter for palliative care: Secondary | ICD-10-CM | POA: Diagnosis not present

## 2020-07-20 DIAGNOSIS — I25810 Atherosclerosis of coronary artery bypass graft(s) without angina pectoris: Secondary | ICD-10-CM | POA: Diagnosis not present

## 2020-07-20 DIAGNOSIS — K5901 Slow transit constipation: Secondary | ICD-10-CM | POA: Diagnosis not present

## 2020-07-20 LAB — CBC
HCT: 30.4 % — ABNORMAL LOW (ref 36.0–46.0)
Hemoglobin: 9.5 g/dL — ABNORMAL LOW (ref 12.0–15.0)
MCH: 31.7 pg (ref 26.0–34.0)
MCHC: 31.3 g/dL (ref 30.0–36.0)
MCV: 101.3 fL — ABNORMAL HIGH (ref 80.0–100.0)
Platelets: 98 10*3/uL — ABNORMAL LOW (ref 150–400)
RBC: 3 MIL/uL — ABNORMAL LOW (ref 3.87–5.11)
RDW: 21.3 % — ABNORMAL HIGH (ref 11.5–15.5)
WBC: 20.9 10*3/uL — ABNORMAL HIGH (ref 4.0–10.5)
nRBC: 0.1 % (ref 0.0–0.2)

## 2020-07-20 LAB — COMPREHENSIVE METABOLIC PANEL
ALT: 175 U/L — ABNORMAL HIGH (ref 0–44)
AST: 36 U/L (ref 15–41)
Albumin: 2.1 g/dL — ABNORMAL LOW (ref 3.5–5.0)
Alkaline Phosphatase: 55 U/L (ref 38–126)
Anion gap: 11 (ref 5–15)
BUN: 45 mg/dL — ABNORMAL HIGH (ref 8–23)
CO2: 31 mmol/L (ref 22–32)
Calcium: 7.9 mg/dL — ABNORMAL LOW (ref 8.9–10.3)
Chloride: 112 mmol/L — ABNORMAL HIGH (ref 98–111)
Creatinine, Ser: 0.54 mg/dL (ref 0.44–1.00)
GFR, Estimated: >60 mL/min (ref >60)
Glucose, Bld: 381 mg/dL — ABNORMAL HIGH (ref 70–99)
Potassium: 4.2 mmol/L (ref 3.5–5.1)
Sodium: 154 mmol/L — ABNORMAL HIGH (ref 135–145)
Total Bilirubin: 0.7 mg/dL (ref 0.3–1.2)
Total Protein: 4.2 g/dL — ABNORMAL LOW (ref 6.5–8.1)

## 2020-07-20 LAB — GLUCOSE, CAPILLARY
Glucose-Capillary: 297 mg/dL — ABNORMAL HIGH (ref 70–99)
Glucose-Capillary: 255 mg/dL — ABNORMAL HIGH (ref 70–99)
Glucose-Capillary: 194 mg/dL — ABNORMAL HIGH (ref 70–99)

## 2020-07-20 LAB — MYOGLOBIN, SERUM: Myoglobin: 1684 ng/mL — ABNORMAL HIGH (ref 25–58)

## 2020-07-20 LAB — CK: Total CK: 424 U/L — ABNORMAL HIGH (ref 38–234)

## 2020-07-20 LAB — PHOSPHORUS: Phosphorus: 1.4 mg/dL — ABNORMAL LOW (ref 2.5–4.6)

## 2020-07-20 LAB — MAGNESIUM: Magnesium: 2.2 mg/dL (ref 1.7–2.4)

## 2020-07-20 MED ORDER — FREE WATER: 250.0000 mL | Status: DC

## 2020-07-20 MED ORDER — MORPHINE SULFATE (PF) 2 MG/ML IV SOLN
2.0000 mg | INTRAVENOUS | Status: DC | PRN
  Administered 2020-07-20 – 2020-07-26 (×19): 2 mg via INTRAVENOUS
  Filled 2020-07-20 (×19): qty 1

## 2020-07-20 MED ORDER — BISACODYL 10 MG RE SUPP: 10.0000 mg | Freq: Every day | RECTAL | Status: DC | PRN

## 2020-07-20 MED ORDER — LORAZEPAM 2 MG/ML IJ SOLN
0.5000 mg | INTRAMUSCULAR | Status: DC | PRN
  Administered 2020-07-20: 0.5 mg via INTRAVENOUS
  Administered 2020-07-21 – 2020-07-23 (×10): 1 mg via INTRAVENOUS
  Filled 2020-07-20 (×11): qty 1

## 2020-07-20 MED ORDER — ACETAMINOPHEN 160 MG/5ML PO SOLN
500.0000 mg | Freq: Three times a day (TID) | ORAL | Status: DC
  Administered 2020-07-20: 500 mg
  Filled 2020-07-20: qty 20.3

## 2020-07-20 NOTE — Progress Notes (Signed)
This chaplain responded to PMT consult for EOL spiritual care.  The chaplain introduced herself to the Pt.  The Pt. husband has stepped away from the bedside.   The Pt. declares, "I want to die."  The chaplain listens as the Pt. describes her faith and wish to be free from pain. The chaplain observed the Pt. find the space to rest and close her eyes.  The chaplain will F/U with the Pt. husband-Billy today.

## 2020-07-20 NOTE — Progress Notes (Signed)
CPT held at this time due to RN placing IV.

## 2020-07-20 NOTE — Progress Notes (Signed)
PROGRESS NOTE    Patricia Villegas  DXI:[id number] DOB: [birth year] DOA: 08/02/2020 PCP: Haywood Pao, MD    Brief Narrative:  Patricia Villegas is a 85 year old female with past medical history remarkable for rheumatoid arthritis, peripheral vascular disease, type 2 diabetes mellitus, essential hypertension, hyperlipidemia, CAD s/p CABG who presented to the ED with leg pain. Patient was noted to have severe rhabdomyolysis, anemia and transaminitis. Neurology and GI were consulted. Hospitalist service consulted for admission.  Significant events: 11/2 admitted for rhabdomyolysis started on IV fluids. 11/4-11/8 IV Solu-Medrol 1 g. 11/6 transferred to progressive care unit due to worsening respiratory distress. 11/7 GI signed off.  No intervention planned.  Continue PPI twice daily. 11/8 cortrack inserted 11/9 CXR with RLL infiltrate consistent with aspiration, started on azithromycin/Unasyn 11/11 Palliative care consulted, transitioned to comfort care   Assessment & Plan:   Principal Problem:   Rhabdomyolysis Active Problems:   Essential hypertension   Mixed hyperlipidemia   Peripheral vascular disease ([place])   CAD (coronary artery disease) of artery bypass graft   Rheumatoid arthritis ([place])   Chronic diastolic CHF (congestive heart failure) ([place])   Type 2 diabetes mellitus with diabetic polyneuropathy, with long-term current use of insulin (H[facility])   Myositis   Constipation   LFT elevation   Malnutrition of moderate degree   Diffuse pain   Severe muscle deconditioning   Goals of care, counseling/discussion   End of life care   DNR (do not resuscitate)   Palliative care by specialist  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 Neurology was consulted and followed during the hospital course. CK level peaked at 31,948. ESR 69. Adolase 77 (high), LDH 730. CMV negative. EBV  IgG elevated 581 and EBV IgM <36.0. HSV/RSV negative. Ceruloplasm normal.  Alpha-1 antitrypsin normal. ANA negative. Mitochondial ab 24.4; equivocal. AsMA negative.  Acetylcholine receptor antibody negative. Anti-HMGCR Ab negative.  Home medications to include Plaquenil, statin, and Protonix were held for concern of toxic myositis.  Patient received 5-day course of IV Solu-Medrol 1 g followed by steroid taper with improvement of CK 31,948 to 622 and Myoglobin >30K to 2121.  --Anti-Hmg-CoA antibody, myositis panel/MyoMarker 3 Profile: pending --Palliative care consulted, now transitioning to comfort measures --Pain control with morphine 2 mg every 1 hours prn --TOC for residential hospice placment --Continue supportive care  Elevated LFTs: resolved AST 525 and ALT 252 on admission. Acute hepatitis panel nonreactive.  GGT 17, within normal limits.  Ultrasound with Doppler unrevealing. Etiology likely secondary to perihepatitis from autoimmune/inflammatory myositis.  Transaminitis resolved with IV steroid treatment.  Acute hypoxic respiratory failure Aspiration pneumonia Etiology likely secondary to severe muscle dysfunction from underlying rhabdomyolysis causing severe dysphagia.  Chest x-ray 07/18/2020 with significant infiltrate right lower lobe consistent with aspiration.  WBC count peaked at 37.5.  Patient was started on azithromycin and Unasyn with Mucomyst, scopolamine patch.  Now transitioning to comfort measures, will discuss continue IV antibiotic treatment.  Dysphagia EGD 07/18/2020 with no findings for esophageal stricture.  Core track initially placed for tube feeds.  Met with palliative care, will now transition to comfort measures and discontinue artificial feeding.  Acute on chronic iron deficiency anemia with melena Recent H. pylori infection, s/p treatment Recent EGD 06/17/2020 by [facility] with 2 nonbleeding duodenal ulcers with biopsy positive for H. pylori.  Completed antibiotic  treatment.  Ferritin level normal, iron level 11 (low), elevated reticulocyte count, B12 608, folate 18, Coombs test  negative. Discontinued home Eliquis.  Received 2 unit PRBC on 07/16/2020.  Evaluated by GI during hospitalization with no further recommendations and signed off.  Now transitioning to comfort measures as above.  Acute on chronic diastolic congestive heart failure Initially treated with IV albumin followed by IV furosemide.  TTE 07/02/2020 with LVEF 17-61%, grade 1 diastolic dysfunction.  Comfort measures as above.  Hypernatremia Etiology likely secondary to poor oral intake/dehydration from dysphagia.  Initially placed on tube feeds with free water supplementation, now transitioned to comfort measures.  Recent LLE DVT Vascular duplex ultrasound 07/03/2020 with findings consistent with acute deep vein thrombosis involving the left posterior tibial veins, left peroneal veins, and left gastrocnemius veins; repeat vascular ultrasound 07/12/2020 with findings of persistent DVT.  Discontinuing anticoagulation as transitioning to comfort measures.  Type 2 diabetes mellitus Hemoglobin A1c 6.1, well controlled.  Discontinuing CBGs and insulin has now transitioned to comfort measures.  Hyperlipidemia Discontinued home simvastatin 80 mg PO QHS 2/2 myositis and transaminitis as above  Rheumatoid arthritis On prednisone 5 mg p.o. daily, Plaquenil 200 mg p.o. twice daily, methotrexate 26m weekly (Sunday).  Discontinue home medications as transition to comfort measures.  CAD s/p CABG Essential Hypertension    DVT prophylaxis: Heparin Code Status: DNR; comfort measures Family Communication: No family present at bedside this morning, updated by palliative care who met with family and transition to comfort measures.  Disposition Plan:  Status is: Inpatient  Remains inpatient appropriate because:Persistent severe electrolyte disturbances, Ongoing active pain requiring inpatient pain  management, Ongoing diagnostic testing needed not appropriate for outpatient work up, Unsafe d/c plan, IV treatments appropriate due to intensity of illness or inability to take PO and Inpatient level of care appropriate due to severity of illness   Dispo: The patient is from: Home              Anticipated d/c is to: Residential hospice              Anticipated d/c date is: > 3 days              Patient currently is medically stable to d/c.  Consultants:   [facility] - signed off 11/7  Palliative care  Procedures:   Core track placement  Antimicrobials:   Azithromycin 11/9 - 11/11  Unasyn 11/9 - 11/11   Subjective: Patient seen and examined bedside, lying in bed.  Complains of diffuse body aches.  Met with palliative care this morning with transition to comfort measures. No other questions or concerns at this time.  Patient denies headache, no dizziness, no chest pain, no abdominal pain.  No other acute concerns per nursing staff this morning, other than her coarse breath sounds.  Objective: Vitals:   07/20/20 0337 07/20/20 0736 07/20/20 0844 07/20/20 1113  BP: 139/70 (!) 139/57  (!) 119/53  Pulse: 73 77  75  Resp: 15 12  18   Temp: 100.1 F (37.8 C) 98.7 F (37.1 C)  98.1 F (36.7 C)  TempSrc: Oral Axillary  Axillary  SpO2: 100% 100% 100% 100%  Weight:      Height:        Intake/Output Summary (Last 24 hours) at 07/20/2020 1207 Last data filed at 07/20/2020 0357 Gross per 24 hour  Intake 10 ml  Output 1650 ml  Net -1640 ml   Filed Weights   07/16/20 0800 07/17/20 0327 07/19/20 0334  Weight: 67 kg 69.6 kg 70.9 kg    Examination:  General exam: Appears calm and comfortable;  chronically ill/frail in appearance Respiratory system: Coarse breath sounds bilaterally, noted apneic episodes while sleeping, decreased breath sounds bases with crackles; on 3 L nasal cannula with SPO2 100% (not on home O2) Cardiovascular system: S1 & S2 heard, RRR. No JVD, murmurs,  rubs, gallops or clicks. No pedal edema. Gastrointestinal system: Abdomen is nondistended, soft and nontender. No organomegaly or masses felt. Normal bowel sounds heard.  Core track tube noted with tube feeds infusing Central nervous system: Alert, oriented to person ([place]), Place (cone), time (2021). No focal neurological deficits. Extremities: Symmetric 5 x 5 power. Skin: No rashes, lesions or ulcers Psychiatry: Judgement and insight appear poor.  Depressed mood & flat affect.     Data Reviewed: I have personally reviewed following labs and imaging studies  CBC: Recent Labs  Lab 07/17/20 0612 07/17/20 0900 07/18/20 0420 07/19/20 0222 07/20/20 0712  WBC 21.8* 21.1* 25.8* 19.5* 20.9*  HGB 9.3* 9.5* 10.1* 9.8* 9.5*  HCT 29.1* 29.6* 31.5* 31.2* 30.4*  MCV 97.7 98.3 99.1 100.6* 101.3*  PLT 128* 131* 130* 115* 98*   Basic Metabolic Panel: Recent Labs  Lab 07/16/20 0438 07/16/20 0902 07/16/20 1810 07/16/20 1810 07/17/20 0900 07/17/20 0900 07/17/20 1815 07/17/20 1815 07/18/20 0420 07/18/20 1220 07/18/20 2105 07/19/20 0222 07/20/20 0533  NA   < >  --  147*   < > 152*   < > 149*   < > 153* 154* 154* 152* 154*  K   < >  --  3.4*   < > 2.6*   < > 3.2*   < > 4.0 3.7 3.1* 3.2* 4.2  CL   < >  --  107   < > 110   < > 110   < > 114* 113* 110 110 112*  CO2   < >  --  26   < > 27   < > 26   < > 30 30 32 32 31  GLUCOSE   < >  --  192*   < > 250*   < > 321*   < > 421* 384* 368* 378* 381*  BUN   < >  --  38*   < > 41*   < > 43*   < > 47* 45* 45* 46* 45*  CREATININE   < >  --  0.63   < > 0.64   < > 0.65   < > 0.63 0.61 0.55 0.46 0.54  CALCIUM   < >  --  8.3*   < > 8.5*   < > 8.2*   < > 8.2* 8.1* 8.0* 8.0* 7.9*  MG  --  1.9 2.4  --   --   --  2.1  --   --   --   --   --  2.2  PHOS  --   --   --   --  3.9  --   --   --   --   --   --  1.9* 1.4*   < > = values in this interval not displayed.   GFR: Estimated Creatinine Clearance: 49.9 mL/min (by C-G formula based on SCr of 0.54  mg/dL). Liver Function Tests: Recent Labs  Lab 07/16/20 0438 07/17/20 0900 07/18/20 0420 07/19/20 0222 07/20/20 0533  AST 193* 94* 58* 39 36  ALT 422* 294* 264* 221* 175*  ALKPHOS 54 48 53 54 55  BILITOT 1.2 1.9* 0.8 0.9 0.7  PROT 4.7* 4.7* 4.7* 4.5* 4.2*  ALBUMIN 2.6* 2.8* 2.6* 2.3* 2.1*   No results for input(s): LIPASE, AMYLASE in the last 168 hours. No results for input(s): AMMONIA in the last 168 hours. Coagulation Profile: Recent Labs  Lab 07/14/20 0342 07/15/20 0529  INR 1.4* 1.3*   Cardiac Enzymes: Recent Labs  Lab 07/16/20 0438 07/17/20 0900 07/18/20 0420 07/19/20 0222 07/20/20 0533  CKTOTAL 3,921* 2,316* 1,006* 622* 424*   BNP (last 3 results) Recent Labs    08/31/19 1052  PROBNP 980*   HbA1C: No results for input(s): HGBA1C in the last 72 hours. CBG: Recent Labs  Lab 07/19/20 2324 07/19/20 2346 07/20/20 0334 07/20/20 0811 07/20/20 1112  GLUCAP 344* 374* 297* 255* 194*   Lipid Profile: No results for input(s): CHOL, HDL, LDLCALC, TRIG, CHOLHDL, LDLDIRECT in the last 72 hours. Thyroid Function Tests: No results for input(s): TSH, T4TOTAL, FREET4, T3FREE, THYROIDAB in the last 72 hours. Anemia Panel: No results for input(s): VITAMINB12, FOLATE, FERRITIN, TIBC, IRON, RETICCTPCT in the last 72 hours. Sepsis Labs: No results for input(s): PROCALCITON, LATICACIDVEN in the last 168 hours.  Recent Results (from the past 240 hour(s))  Respiratory Panel by RT PCR (Flu A&B, Covid) - Nasopharyngeal Swab     Status: None   Collection Time: 08/01/2020  6:30 AM   Specimen: Nasopharyngeal Swab  Result Value Ref Range Status   SARS Coronavirus 2 by RT PCR NEGATIVE NEGATIVE Final    Comment: (NOTE) SARS-CoV-2 target nucleic acids are NOT DETECTED.  The SARS-CoV-2 RNA is generally detectable in upper respiratoy specimens during the acute phase of infection. The lowest concentration of SARS-CoV-2 viral copies this assay can detect is 131 copies/mL. A  negative result does not preclude SARS-Cov-2 infection and should not be used as the sole basis for treatment or other patient management decisions. A negative result may occur with  improper specimen collection/handling, submission of specimen other than nasopharyngeal swab, presence of viral mutation(s) within the areas targeted by this assay, and inadequate number of viral copies (<131 copies/mL). A negative result must be combined with clinical observations, patient history, and epidemiological information. The expected result is Negative.  Fact Sheet for Patients:  PinkCheek.be  Fact Sheet for Healthcare Providers:  GravelBags.it  This test is no t yet approved or cleared by the [place] FDA and  has been authorized for detection and/or diagnosis of SARS-CoV-2 by FDA under an Emergency Use Authorization (EUA). This EUA will remain  in effect (meaning this test can be used) for the duration of the COVID-19 declaration under Section 564(b)(1) of the Act, 21 U.S.C. section 360bbb-3(b)(1), unless the authorization is terminated or revoked sooner.     Influenza A by PCR NEGATIVE NEGATIVE Final   Influenza B by PCR NEGATIVE NEGATIVE Final    Comment: (NOTE) The Xpert Xpress SARS-CoV-2/FLU/RSV assay is intended as an aid in  the diagnosis of influenza from Nasopharyngeal swab specimens and  should not be used as a sole basis for treatment. Nasal washings and  aspirates are unacceptable for Xpert Xpress SARS-CoV-2/FLU/RSV  testing.  Fact Sheet for Patients: PinkCheek.be  Fact Sheet for Healthcare Providers: GravelBags.it  This test is not yet approved or cleared by the [place] FDA and  has been authorized for detection and/or diagnosis of SARS-CoV-2 by  FDA under an Emergency Use Authorization (EUA). This EUA will remain  in effect (meaning this test can  be used) for the duration of the  Covid-19 declaration under Section 564(b)(1) of the Act, 21  U.S.C. section  360bbb-3(b)(1), unless the authorization is  terminated or revoked. Performed at [facility], [place] [address], [place], [place] [address]   Culture, Urine     Status: Abnormal   Collection Time: 07/16/20  4:29 PM   Specimen: Urine, Random  Result Value Ref Range Status   Specimen Description URINE, RANDOM  Final   Special Requests   Final    NONE Performed at [facility], [place] [address], [place], [place] [address]    Culture MULTIPLE SPECIES PRESENT, SUGGEST RECOLLECTION (A)  Final   Report Status 07/17/2020 FINAL  Final         Radiology Studies: DG CHEST PORT 1 VIEW  Result Date: 07/19/2020 CLINICAL DATA:  Shortness of breath. EXAM: PORTABLE CHEST 1 VIEW COMPARISON:  November 9 21 FINDINGS: Similar cardiomediastinal silhouette. Postsurgical change with median sternotomy. Aortic atherosclerosis. Similar positioning of the right PICC. Enteric tube courses below the diaphragm and outside the field of view. Similar right basilar airspace opacities. No acute osseous abnormality. IMPRESSION: Similar right basilar airspace opacities, compatible with pneumonia and/or aspiration. Electronically Signed   By: Margaretha Sheffield MD   On: 07/19/2020 10:11   DG CHEST PORT 1 VIEW  Result Date: 07/18/2020 CLINICAL DATA:  Shortness of breath EXAM: PORTABLE CHEST 1 VIEW COMPARISON:  07/15/2020 FINDINGS: Cardiac shadow is stable. Postsurgical changes are noted. Aortic calcifications are seen. Feeding catheter extends at least into the distal stomach. Significant increased airspace opacity is noted in the right base consistent with acute pneumonia. No sizable effusion is seen. No bony abnormality is noted. IMPRESSION: Findings consistent with acute right basilar pneumonia. Electronically Signed   By: Inez Catalina M.D.   On: 07/18/2020 13:57   DG Abd Portable 1V  Result Date:  07/18/2020 CLINICAL DATA:  Constipation, check feeding catheter placement EXAM: PORTABLE ABDOMEN - 1 VIEW COMPARISON:  07/17/2019 FINDINGS: Feeding catheter is noted within the distal stomach/proximal duodenum. Contrast material is noted throughout the colon similar to that seen on the prior exam. Postsurgical changes are noted. No significant findings of constipation are seen. No free air is noted. IMPRESSION: Feeding catheter as described. No other focal abnormality is seen. Electronically Signed   By: Inez Catalina M.D.   On: 07/18/2020 13:58        Scheduled Meds: . chlorhexidine  15 mL Mouth Rinse BID  . furosemide  40 mg Intravenous Daily  . insulin aspart  3 Units Subcutaneous Q4H  . liver oil-zinc oxide  1 application Topical TID  . magic mouthwash  5 mL Oral QID  . mouth rinse  15 mL Mouth Rinse q12n4p  . scopolamine  1 patch Transdermal Q72H   Continuous Infusions:    LOS: 9 days    Time spent: 38 minutes spent on chart review, discussion with nursing staff, consultants, updating family and interview/physical exam; more than 50% of that time was spent in counseling and/or coordination of care.    Liah Morr J [place] DO [facility] Available via Epic secure chat 7am-7pm After these hours, please refer to coverage provider listed on [email or web address] 07/20/2020, 12:07 PM

## 2020-07-20 NOTE — Progress Notes (Signed)
Nutrition Brief Note  Chart reviewed. Pt now transitioning to comfort care.  No further nutrition interventions warranted at this time.  Please re-consult as needed.    Gaynell Face, MS, RD, LDN Inpatient Clinical Dietitian Please see AMiON for contact information.

## 2020-07-20 NOTE — Progress Notes (Signed)
Brief neuro note: Saw pt yesterday to discuss treatment plan. Between yesterday and this a.m., pt has been transitioned to comfort measures. Palliative consulted and are talking d/c to Hospice home. Discussed with attending TRH. Neuro will sign off. So sorry for this nice lady and her loving husband.  Clance Boll, NP/[facility]

## 2020-07-20 NOTE — Consult Note (Addendum)
Palliative Medicine Inpatient Consult Note  Reason for consult:   "46F admit with Rhabdo/polmyositis, ASp PNA, dysphagia now on cortrack. Please assist with GOC/MDM."  HPI:  Per intake H&P --> Patricia Villegas is a 85 year old female with past medical history remarkable for rheumatoid arthritis, peripheral vascular disease, type 2 diabetes mellitus, essential hypertension, hyperlipidemia, CAD s/p CABG who presented to the ED with leg pain. Patient was noted to have severe rhabdomyolysis, anemia and transaminitis. Neurology and GI were consulted.  Palliative care was asked to get involved to further discuss goals of care in the setting of multiple chronic diseases and now recurrent aspiration pneumonia.   Clinical Assessment/Goals of Care: I have reviewed medical records including EPIC notes, labs and imaging, received report from bedside RN, assessed the patient.    I met with Patricia Villegas and her husband, Patricia Villegas to further discuss diagnosis prognosis, GOC, EOL wishes, disposition and options.   I introduced Palliative Medicine as specialized medical care for Villegas living with serious illness. It focuses on providing relief from the symptoms and stress of a serious illness. The goal is to improve quality of life for both the patient and the family.  I asked Patricia Villegas to tell me about herself. She shares that she lives in [place], [place]. She and her husband, Patricia Villegas have been married for the past 8 years. They share two children together, a son [name] and a daughter, [name]. Karema use to work at [employer]. She shares that she gets joy from being around her family members. She is a faithful woman and practices within the [facility] denomination.  In terms of her overall course. [name] has had trouble since the end of September when she injured her hip. She has since been in and out of the hospital and rehabilitation settings. Overtime [name] has had ongoing issues with mobility and presently is unable  to raise her arms due to profound weakness. [name] shares that she "knows she is dying". We talked about what she would wish for in this situation if we should continue with the present aggressive measures or move our focus towards symptom relief and allowing her to pass as peacefully as possible. She has elected for the later.   A detailed discussion was had today regarding advanced directives, patient does not have any on file at this time.   Concepts specific to code status, artifical feeding and hydration, continued IV antibiotics and rehospitalization was had. Patient is DNAR/DNI - comfort oriented care.  We talked about transition to comfort measures in house and what that would entail inclusive of medications to control pain, dyspnea, agitation, nausea, itching, and hiccups.  We discussed stopping all uneccessary measures such as blood draws, needle sticks, and frequent vital signs.   While speaking to Patricia Villegas, I utilized reflective listening throughout our time together. This was clearly a very touch conversation to be having.  The difference between a aggressive medical intervention path  and a palliative comfort care path for this patient at this time was had. Values and goals of care important to patient and family were attempted to be elicited  Discussed the importance of continued conversation with family and their  medical providers regarding overall plan of care and treatment options, ensuring decisions are within the context of the patients values and GOCs.  Provided "Hard Choices for [employer]" booklet.   Decision Maker: [name] (Spouse) [phone]  SUMMARY OF RECOMMENDATIONS   DNAR/DNI  Comfort focused [place]  Liberalize visitation  restrictions given comfort focus  TOC - Inpatient Hospice  Code Status/Advance Care Planning: DNAR/DNI   Symptom Management:  Dyspnea: Pain:  - Morphine 18m IV Q1H PRN Fever:  - Tylenol 6543mPO/PR Q6H  PRN Agitation: Anxiety:  - Lorazepam 0.5-49m2mV Q1H PRN Nausea:  - Zofran 4mg29m/IV Q6H PRN  Secretions:  - Atropine Patch Dry Eyes:  - Artificial Tears PRN Xerostomia:  - BID oral care Urinary Retention:  - Bladder scan if > 250ml45mce and maintain foley  Constipation:  - Bisacodyl 10mg 83mRN QDay Spiritual:  - Chaplain consult   Palliative Prophylaxis:   Oral Care, Range of Motion  Additional Recommendations (Limitations, Scope, Preferences):  Education on chronic diseases - aspiration   Psycho-social/Spiritual:   Desire for further Chaplaincy support: Yes  Additional Recommendations: Education on end of life care   Prognosis: Poor likely days  Discharge Planning: Discharge to residential hospice is most likely.   Oral Intake %:  25% Bowel Movements:  Last 11/8 Mobility: Limited d/t weakness  Vitals:   07/19/20 2253 07/20/20 0337  BP: (!) 143/74 139/70  Pulse: 82 73  Resp:  15  Temp:  100.1 F (37.8 C)  SpO2:  100%    Intake/Output Summary (Last 24 hours) at 07/20/2020 0640 Last data filed at 07/20/2020 0357 Gross per 24 hour  Intake 10 ml  Output 2300 ml  Net -2290 ml   Last Weight  Most recent update: 07/19/2020  3:35 AM   Weight  70.9 kg (156 lb 4.9 oz)           Gen:  Frail elderly female HEENT: Dry mucous membranes CV: Regular rate and rhythm, no murmurs rubs or gallops PULM: Dimished ABD: soft/nontender/nondistended/normal bowel sounds  EXT: (+) Generalized edema Neuro: Alert and oriented x4  PPS: 20%   This conversation/these recommendations were discussed with patient primary care team, Dr. [place] In: 0830 Time Out: 0950 Total Time: 80 minutes Greater than 50%  of this time was spent counseling and coordinating care related to the above assessment and plan.  [name] Cell Phone: [phone] utilize secure chat with additional questions, if there is no  response within 30 minutes please call the above phone number  Palliative Medicine Team providers are available by phone from 7am to 7pm daily and can be reached through the team cell phone.  Should this patient require assistance outside of these hours, please call the patient's attending physician.

## 2020-07-20 NOTE — Progress Notes (Signed)
PT Cancellation Note  Patient Details Name: Patricia Villegas MRN: [id number] DOB: 27-Jul-1935   Cancelled Treatment:    Reason Eval/Treat Not Completed: (P) Other (comment) (Pt is now comfort care and will defer PT as orders have been cancelled.)   Pamala Hayman Eli Hose 07/20/2020, 2:46 PM  Erasmo Leventhal , PTA Acute Rehabilitation Services Pager [phone] Office [phone]

## 2020-07-20 NOTE — Progress Notes (Signed)
This chaplain was pastorally present for F/U spiritual care with the Pt. and Pt. family.  The Pt. is alert and attentive to the conversation in the room. Often joking as the family will share a funny story.    The family is attentive to her requests to wet her mouth with her choice of Sprite or Coke.  The chaplain understands the love in the room is reflective of God's love and the love the Pt. has shared with others.  The Pt. accepted the chaplain's invitation for prayer and a F/U spiritual care visit.

## 2020-07-21 DIAGNOSIS — Z7189 Other specified counseling: Secondary | ICD-10-CM | POA: Diagnosis not present

## 2020-07-21 DIAGNOSIS — I5032 Chronic diastolic (congestive) heart failure: Secondary | ICD-10-CM | POA: Diagnosis not present

## 2020-07-21 DIAGNOSIS — E139 Other specified diabetes mellitus without complications: Secondary | ICD-10-CM | POA: Diagnosis not present

## 2020-07-21 DIAGNOSIS — I25810 Atherosclerosis of coronary artery bypass graft(s) without angina pectoris: Secondary | ICD-10-CM | POA: Diagnosis not present

## 2020-07-21 DIAGNOSIS — Z66 Do not resuscitate: Secondary | ICD-10-CM | POA: Diagnosis not present

## 2020-07-21 DIAGNOSIS — K5901 Slow transit constipation: Secondary | ICD-10-CM | POA: Diagnosis not present

## 2020-07-21 DIAGNOSIS — Z515 Encounter for palliative care: Secondary | ICD-10-CM | POA: Diagnosis not present

## 2020-07-21 LAB — MYOGLOBIN, SERUM: Myoglobin: 1662 ng/mL — ABNORMAL HIGH (ref 25–58)

## 2020-07-21 MED ORDER — HALOPERIDOL LACTATE 5 MG/ML IJ SOLN
2.0000 mg | Freq: Four times a day (QID) | INTRAMUSCULAR | Status: DC | PRN
  Administered 2020-07-21 – 2020-07-22 (×2): 2 mg via INTRAVENOUS
  Filled 2020-07-21 (×2): qty 1

## 2020-07-21 MED ORDER — GLYCOPYRROLATE 0.2 MG/ML IJ SOLN
0.4000 mg | INTRAMUSCULAR | Status: DC
  Administered 2020-07-21 – 2020-07-27 (×34): 0.4 mg via INTRAVENOUS
  Filled 2020-07-21 (×35): qty 2

## 2020-07-21 NOTE — Progress Notes (Addendum)
PROGRESS NOTE    Patricia Villegas  WUJ:[id number] DOB: December 05, 1934 DOA: 07/31/2020 PCP: Haywood Pao, MD    Brief Narrative:  Patricia Villegas is a 85 year old female with past medical history remarkable for rheumatoid arthritis, peripheral vascular disease, type 2 diabetes mellitus, essential hypertension, hyperlipidemia, CAD s/p CABG who presented to the ED with leg pain. Patient was noted to have severe rhabdomyolysis, anemia and transaminitis. Neurology and GI were consulted. Hospitalist service consulted for admission.  Significant events: 11/2 admitted for rhabdomyolysis started on IV fluids. 11/4-11/8 IV Solu-Medrol 1 g. 11/6 transferred to progressive care unit due to worsening respiratory distress. 11/7 GI signed off.  No intervention planned.  Continue PPI twice daily. 11/8 cortrack inserted 11/9 CXR with RLL infiltrate consistent with aspiration, started on azithromycin/Unasyn 11/11 Palliative care consulted, transitioned to comfort care 11/12 awaiting residential hospice bed   Assessment & Plan:   Principal Problem:   Rhabdomyolysis Active Problems:   Essential hypertension   Mixed hyperlipidemia   Peripheral vascular disease ([place])   CAD (coronary artery disease) of artery bypass graft   Rheumatoid arthritis ([place])   Chronic diastolic CHF (congestive heart failure) ([place])   Type 2 diabetes mellitus with diabetic polyneuropathy, with long-term current use of insulin (H[facility])   Myositis   Constipation   LFT elevation   Malnutrition of moderate degree   Diffuse pain   Severe muscle deconditioning   Goals of care, counseling/discussion   Terminal care   DNR (do not resuscitate)   Palliative care by specialist  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 Neurology was consulted and followed during the hospital course. CK level peaked at 31,948. ESR 69. Adolase  77 (high), LDH 730. CMV negative. EBV IgG elevated 581 and EBV IgM <36.0. HSV/RSV negative. Ceruloplasm normal.  Alpha-1 antitrypsin normal. ANA negative. Mitochondial ab 24.4; equivocal. AsMA negative.  Acetylcholine receptor antibody negative. Anti-HMGCR Ab negative.  Home medications to include Plaquenil, statin, and Protonix were held for concern of toxic myositis.  Patient received 5-day course of IV Solu-Medrol 1 g followed by steroid taper with improvement of CK 31,948 to 622 and Myoglobin >30K to 2121.  --myositis panel/MyoMarker 3 Profile: pending --Palliative care following, transitioned to comfort measures --Pain control with morphine 2 mg every 1 hours prn --Lorazepam 0.5-1 mg IV every 1 hour as needed anxiety --Awaiting residential hospice placement --Continue supportive care  Elevated LFTs: resolved AST 525 and ALT 252 on admission. Acute hepatitis panel nonreactive.  GGT 17, within normal limits.  Ultrasound with Doppler unrevealing. Etiology likely secondary to perihepatitis from autoimmune/inflammatory myositis.  Transaminitis resolved with IV steroid treatment.  Acute hypoxic respiratory failure Aspiration pneumonia Etiology likely secondary to severe muscle dysfunction from underlying rhabdomyolysis causing severe dysphagia.  Chest x-ray 07/18/2020 with significant infiltrate right lower lobe consistent with aspiration.  WBC count peaked at 37.5.  Patient was started on azithromycin and Unasyn with Mucomyst, scopolamine patch.  Now transitioned to comfort measures, discontinued IV antibiotic treatment.  Dysphagia EGD 07/18/2020 with no findings for esophageal stricture.  Core track initially placed for tube feeds.  Met with palliative care, now transitioned to comfort measures and discontinued artificial feeding.  Acute on chronic iron deficiency anemia with melena Recent H. pylori infection, s/p treatment Recent EGD 06/17/2020 by [facility] with 2 nonbleeding duodenal ulcers  with biopsy positive for H. pylori.  Completed antibiotic treatment.  Ferritin level normal, iron level 11 (low), elevated reticulocyte count,  B12 608, folate 18, Coombs test negative. Discontinued home Eliquis.  Received 2 unit PRBC on 07/16/2020.  Evaluated by GI during hospitalization with no further recommendations and signed off.  Now transitionined to comfort measures as above.  Acute on chronic diastolic congestive heart failure Initially treated with IV albumin followed by IV furosemide.  TTE 07/02/2020 with LVEF 75-91%, grade 1 diastolic dysfunction.  Comfort measures as above.  Hypernatremia Etiology likely secondary to poor oral intake/dehydration from dysphagia.  Initially placed on tube feeds with free water supplementation, now transitioned to comfort measures.  Recent LLE DVT Vascular duplex ultrasound 07/03/2020 with findings consistent with acute deep vein thrombosis involving the left posterior tibial veins, left peroneal veins, and left gastrocnemius veins; repeat vascular ultrasound 07/12/2020 with findings of persistent DVT.  Discontinued anticoagulation as transitioned to comfort measures.  Type 2 diabetes mellitus Hemoglobin A1c 6.1, well controlled.  Discontinuing CBGs and insulin has now transitioned to comfort measures.  Hyperlipidemia Discontinued home simvastatin 80 mg PO QHS 2/2 myositis and transaminitis as above  Rheumatoid arthritis On prednisone 5 mg p.o. daily, Plaquenil 200 mg p.o. twice daily, methotrexate 31m weekly (Sunday).  Discontinue home medications as transition to comfort measures.  CAD s/p CABG Essential Hypertension    DVT prophylaxis: Heparin Code Status: DNR; comfort measures Family Communication: No family present at bedside this morning, awaiting residential hospice placement  Disposition Plan:  Status is: Inpatient  Remains inpatient appropriate because:Persistent severe electrolyte disturbances, Ongoing active pain requiring  inpatient pain management, Ongoing diagnostic testing needed not appropriate for outpatient work up, Unsafe d/c plan, IV treatments appropriate due to intensity of illness or inability to take PO and Inpatient level of care appropriate due to severity of illness   Dispo: The patient is from: Home              Anticipated d/c is to: Residential hospice              Anticipated d/c date is: > 3 days              Patient currently is medically stable to d/c.  Consultants:    GI - signed off 11/7  Palliative care  Procedures:   Core track placement  Antimicrobials:   Azithromycin 11/9 - 11/11  Unasyn 11/9 - 11/11   Subjective: Patient seen and examined bedside, lying in bed.  Somnolent, will slightly open eyes to command.  Appears comfortable.  No family present at bedside this morning.  Awaiting residential hospice placement.  No acute concerns per nursing staff..  Objective: Vitals:   07/20/20 0844 07/20/20 1113 07/20/20 1808 07/21/20 0602  BP:  (!) 119/53 (!) 117/50 (!) 150/59  Pulse:  75 67 72  Resp:  _0 Temp:  98.1 F (36.7 C) 98.3 F (36.8 C) 98.5 F (36.9 C)  TempSrc:  Axillary Oral Axillary  SpO2: 100% 100% 96% 93%  Weight:      Height:        Intake/Output Summary (Last 24 hours) at 07/21/2020 1502 Last data filed at 07/20/2020 1800 Gross per 24 hour  Intake 0 ml  Output 1200 ml  Net -1200 ml   Filed Weights   07/16/20 0800 07/17/20 0327 07/19/20 0334  Weight: 67 kg 69.6 kg 70.9 kg    Examination:  General exam: Appears calm and comfortable; chronically ill/frail in appearance, somnolent Respiratory system: Coarse breath sounds bilaterally, noted apneic episodes while sleeping, decreased breath sounds bases with crackles; on room air  Cardiovascular system: S1 & S2 heard, RRR. No JVD, murmurs, rubs, gallops or clicks. No pedal edema. Gastrointestinal system: Abdomen is nondistended, soft and nontender. No organomegaly or masses felt.  Normal bowel sounds heard.   Central nervous system: Somnolent, opens eyes slightly to command Extremities: Symmetric 5 x 5 power. Skin: No rashes, lesions or ulcers Psychiatry: Somnolent    Data Reviewed: I have personally reviewed following labs and imaging studies  CBC: Recent Labs  Lab 07/17/20 0612 07/17/20 0900 07/18/20 0420 07/19/20 0222 07/20/20 0712  WBC 21.8* 21.1* 25.8* 19.5* 20.9*  HGB 9.3* 9.5* 10.1* 9.8* 9.5*  HCT 29.1* 29.6* 31.5* 31.2* 30.4*  MCV 97.7 98.3 99.1 100.6* 101.3*  PLT 128* 131* 130* 115* 98*   Basic Metabolic Panel: Recent Labs  Lab 07/16/20 0438 07/16/20 0902 07/16/20 1810 07/16/20 1810 07/17/20 0900 07/17/20 0900 07/17/20 1815 07/17/20 1815 07/18/20 0420 07/18/20 1220 07/18/20 2105 07/19/20 0222 07/20/20 0533  NA   < >  --  147*   < > 152*   < > 149*   < > 153* 154* 154* 152* 154*  K   < >  --  3.4*   < > 2.6*   < > 3.2*   < > 4.0 3.7 3.1* 3.2* 4.2  CL   < >  --  107   < > 110   < > 110   < > 114* 113* 110 110 112*  CO2   < >  --  26   < > 27   < > 26   < > 30 30 32 32 31  GLUCOSE   < >  --  192*   < > 250*   < > 321*   < > 421* 384* 368* 378* 381*  BUN   < >  --  38*   < > 41*   < > 43*   < > 47* 45* 45* 46* 45*  CREATININE   < >  --  0.63   < > 0.64   < > 0.65   < > 0.63 0.61 0.55 0.46 0.54  CALCIUM   < >  --  8.3*   < > 8.5*   < > 8.2*   < > 8.2* 8.1* 8.0* 8.0* 7.9*  MG  --  1.9 2.4  --   --   --  2.1  --   --   --   --   --  2.2  PHOS  --   --   --   --  3.9  --   --   --   --   --   --  1.9* 1.4*   < > = values in this interval not displayed.   GFR: Estimated Creatinine Clearance: 49.9 mL/min (by C-G formula based on SCr of 0.54 mg/dL). Liver Function Tests: Recent Labs  Lab 07/16/20 0438 07/17/20 0900 07/18/20 0420 07/19/20 0222 07/20/20 0533  AST 193* 94* 58* 39 36  ALT 422* 294* 264* 221* 175*  ALKPHOS 54 48 53 54 55  BILITOT 1.2 1.9* 0.8 0.9 0.7  PROT 4.7* 4.7* 4.7* 4.5* 4.2*  ALBUMIN 2.6* 2.8* 2.6* 2.3* 2.1*    No results for input(s): LIPASE, AMYLASE in the last 168 hours. No results for input(s): AMMONIA in the last 168 hours. Coagulation Profile: Recent Labs  Lab 07/15/20 0529  INR 1.3*   Cardiac Enzymes: Recent Labs  Lab 07/16/20 0438 07/17/20 0900 07/18/20 0420 07/19/20 0222 07/20/20 0533  CKTOTAL 3,921* 2,316* 1,006* 622* 424*   BNP (last 3 results) Recent Labs    08/31/19 1052  PROBNP 980*   HbA1C: No results for input(s): HGBA1C in the last 72 hours. CBG: Recent Labs  Lab 07/19/20 2324 07/19/20 2346 07/20/20 0334 07/20/20 0811 07/20/20 1112  GLUCAP 344* 374* 297* 255* 194*   Lipid Profile: No results for input(s): CHOL, HDL, LDLCALC, TRIG, CHOLHDL, LDLDIRECT in the last 72 hours. Thyroid Function Tests: No results for input(s): TSH, T4TOTAL, FREET4, T3FREE, THYROIDAB in the last 72 hours. Anemia Panel: No results for input(s): VITAMINB12, FOLATE, FERRITIN, TIBC, IRON, RETICCTPCT in the last 72 hours. Sepsis Labs: No results for input(s): PROCALCITON, LATICACIDVEN in the last 168 hours.  Recent Results (from the past 240 hour(s))  Culture, Urine     Status: Abnormal   Collection Time: 07/16/20  4:29 PM   Specimen: Urine, Random  Result Value Ref Range Status   Specimen Description URINE, RANDOM  Final   Special Requests   Final    NONE Performed at [facility], [address], [place], [place] [address]    Culture MULTIPLE SPECIES PRESENT, SUGGEST RECOLLECTION (A)  Final   Report Status 07/17/2020 FINAL  Final         Radiology Studies: No results found.      Scheduled Meds: . chlorhexidine  15 mL Mouth Rinse BID  . furosemide  40 mg Intravenous Daily  . liver oil-zinc oxide  1 application Topical TID  . magic mouthwash  5 mL Oral QID  . mouth rinse  15 mL Mouth Rinse q12n4p  . scopolamine  1 patch Transdermal Q72H   Continuous Infusions:    LOS: 10 days    Time spent: 26 minutes spent on chart review, discussion with  nursing staff, consultants, updating family and interview/physical exam; more than 50% of that time was spent in counseling and/or coordination of care.    Franziska Podgurski J [place] DO [facility] Available via Epic secure chat 7am-7pm After these hours, please refer to coverage provider listed on [email or web address] 07/21/2020, 3:02 PM

## 2020-07-21 NOTE — Progress Notes (Signed)
Palliative Medicine Inpatient Follow Up Note  Reason for consult:   "23F admit with Rhabdo/polmyositis, ASp PNA, dysphagia now on cortrack. Please assist with GOC/MDM."  HPI:  Per intake H&P --> Patricia Villegas a 85 year old female with past medical history remarkable for rheumatoid arthritis, peripheral vascular disease, type 2 diabetes mellitus, essential hypertension, hyperlipidemia, CAD s/p CABG who presented to the ED with leg pain. Patient was noted to have severe rhabdomyolysis, anemia and transaminitis. Neurology and GI were consulted.  Palliative care was asked to get involved to further discuss goals of care in the setting of multiple chronic diseases and now recurrent aspiration pneumonia.   Today's Discussion (07/21/2020): Chart reviewed. I met with Patricia Villegas and her husband, [name] at bedside. Patricia Villegas is somnolent this morning. [name] shares that he is having a tough time digesting her decline and the reality of her end of life. I offered therapeutic listening as a form of emotional support. I shared that we plan to be here to aid him as best we can. He was thankful for this.  I doubt Patricia Villegas will make it to residential hospice at this point as it does appear that she has entered the more active phases of dying as indicated by her mottling on her lower extremities and their coolness to touch.  At this juncture the supplemental O2 does not seem to be serving a great purpose therefore has been removed.  Questions and concerns addressed   Objective Assessment: Vital Signs Vitals:   07/20/20 1808 07/21/20 0602  BP: (!) 117/50 (!) 150/59  Pulse: 67 72  Resp: 17 18  Temp: 98.3 F (36.8 C) 98.5 F (36.9 C)  SpO2: 96% 93%    Intake/Output Summary (Last 24 hours) at 07/21/2020 0748 Last data filed at 07/20/2020 1800 Gross per 24 hour  Intake 0 ml  Output 1200 ml  Net -1200 ml   Last Weight  Most recent update: 07/19/2020  3:35 AM   Weight  70.9 kg (156 lb 4.9 oz)            Gen:  Frail elderly female HEENT: Dry mucous membranes CV: Regular rate and rhythm, no murmurs rubs or gallops PULM: Dimished ABD: soft/nontender/nondistended/normal bowel sounds  EXT: (+) Generalized edema Neuro: Alert and oriented x4  SUMMARY OF RECOMMENDATIONS DNAR/DNI  Comfort focused [place]  Liberalize visitation restrictions given comfort focus  TOC - Inpatient Hospice  Prognosis anticipate days  Code Status/Advance Care Planning: DNAR/DNI  Symptom Management: Dyspnea: Pain:                 - Morphine 37m IV Q1H PRN Fever:                 - Tylenol 6550mPO/PR Q6H PRN Agitation: Anxiety:                 - Lorazepam 0.5-14m59mV Q1H PRN Nausea:                 - Zofran 4mg52m/IV Q6H PRN             Secretions:                 - Atropine Patch Dry Eyes:                 - Artificial Tears PRN Xerostomia:                 - BID oral care Urinary Retention:                 -  Bladder scan if > 247m place and maintain foley  Constipation:                 - Bisacodyl 135mPR PRN QDay Spiritual:                 - Chaplain consult  Time In: 0645 Time Out: 0710 Time Spent: 25 Greater than 50% of the time was spent in counseling and coordination of care ______________________________________________________________________________________ [facility] Team Cell Phone: [phone] utilize secure chat with additional questions, if there is no response within 30 minutes please call the above phone number  Palliative Medicine Team providers are available by phone from 7am to 7pm daily and can be reached through the team cell phone.  Should this patient require assistance outside of these hours, please call the patient's attending physician.

## 2020-07-21 NOTE — TOC Progression Note (Signed)
Transition of Care [facility]) - Progression Note    Patient Details  Name: Patricia Villegas MRN: [id number] Date of Birth: Nov 05, 1934  Transition of Care [facility]) CM/SW [place], [place] Phone Number: 07/21/2020, 9:56 AM  Clinical Narrative:     CSW spoke with pt's son Cathern Tahir and confirmed family wants pt to transition to hospice facility. Preference for [facility]. Referral made to [facility]. No beds available today.   Expected Discharge Plan: [place] Barriers to Discharge: Hospice Bed not available  Expected Discharge Plan and Services Expected Discharge Plan: [place]       Living arrangements for the past 2 months: Single Family Home                                       Social Determinants of Health (SDOH) Interventions    Readmission Risk Interventions Readmission Risk Prevention Plan 07/15/2020  SW Recovery Care/Counseling Consult Complete  Skilled Nursing Facility Complete  Some recent data might be hidden

## 2020-07-21 NOTE — Progress Notes (Signed)
Manufacturing engineer [facility]) Hospital Liaison note.    Received request from [place] for family interest in [facility]. Spoke with son [name] who confirmed interest. Chart and pt information under review by [facility] physician.  Hospice eligibility pending at this time.  [facility] is unable to offer a room today. Hospital Liaison will follow up tomorrow or sooner if a room becomes available.    Please do not hesitate to call with questions.  Thank you for the opportunity to participate in this patient's care.  Chrislyn Edison Pace, BSN, RN [place] (listed on [place] under Hospice/[facility])    [phone]

## 2020-07-21 NOTE — Progress Notes (Signed)
This chaplain is present for F/U spiritual care with the Pt. The chaplain understands the Pt. family just stepped away.  The Pt. appears to have found a place of peace and comfort. The shared "The Lord's Prayer" with the Pt. F/U spiritual care is available as needed.

## 2020-07-22 DIAGNOSIS — K5901 Slow transit constipation: Secondary | ICD-10-CM | POA: Diagnosis not present

## 2020-07-22 DIAGNOSIS — I5032 Chronic diastolic (congestive) heart failure: Secondary | ICD-10-CM | POA: Diagnosis not present

## 2020-07-22 DIAGNOSIS — Z515 Encounter for palliative care: Secondary | ICD-10-CM | POA: Diagnosis not present

## 2020-07-22 DIAGNOSIS — Z7189 Other specified counseling: Secondary | ICD-10-CM | POA: Diagnosis not present

## 2020-07-22 DIAGNOSIS — I25810 Atherosclerosis of coronary artery bypass graft(s) without angina pectoris: Secondary | ICD-10-CM | POA: Diagnosis not present

## 2020-07-22 DIAGNOSIS — E139 Other specified diabetes mellitus without complications: Secondary | ICD-10-CM | POA: Diagnosis not present

## 2020-07-22 MED ORDER — LORAZEPAM 2 MG/ML IJ SOLN
0.5000 mg | INTRAMUSCULAR | 0 refills | Status: AC | PRN
Start: 2020-07-22 — End: ?

## 2020-07-22 MED ORDER — GLYCOPYRROLATE 0.2 MG/ML IJ SOLN: 0.4000 mg | INTRAMUSCULAR | 0 refills | Status: AC

## 2020-07-22 MED ORDER — HALOPERIDOL LACTATE 5 MG/ML IJ SOLN: 2.0000 mg | Freq: Four times a day (QID) | INTRAMUSCULAR | 0 refills | Status: AC | PRN

## 2020-07-22 MED ORDER — MORPHINE SULFATE (PF) 2 MG/ML IV SOLN: 2.0000 mg | INTRAVENOUS | 0 refills | Status: AC | PRN

## 2020-07-22 MED ORDER — ONDANSETRON HCL 4 MG/2ML IJ SOLN: 4.0000 mg | Freq: Four times a day (QID) | INTRAMUSCULAR | 0 refills | Status: AC | PRN

## 2020-07-22 MED ORDER — SCOPOLAMINE 1 MG/3DAYS TD PT72: 1.0000 | MEDICATED_PATCH | TRANSDERMAL | 12 refills | Status: AC

## 2020-07-22 NOTE — Discharge Summary (Signed)
Physician Discharge Summary  [name]:[id number] DOB: [birth year] DOA: 07/13/2020  PCP: Haywood Pao, MD  Admit date: 07/13/2020 Discharge date: 07/22/2020  Admitted From: Home Disposition: [place] residential hosp[facility]  Recommendations for Outpatient Follow-up:  1. Follow-up with provider at residential hospice  Discharge Condition: Hospice/grim prognosis possible CODE STATUS: DNR/comfort measures Diet recommendation: Comfort feeds as tolerates  History of present illness:  Patricia Villegas is a 85 year old female with past medical history remarkable for rheumatoid arthritis, peripheral vascular disease, type 2 diabetes mellitus, essential hypertension, hyperlipidemia, CAD s/p CABG who presented to the ED with leg pain. Patient was noted to have severe rhabdomyolysis, anemia and transaminitis. Neurology and GI were consulted. Hospitalist service consulted for admission.  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 Neurology was consulted and followed during the hospital course. CK level peaked at 31,948. ESR 69. Adolase 77 (high), LDH 730. CMV negative. EBV IgG elevated 581 and EBV IgM <36.0. HSV/RSV negative. Ceruloplasm normal.  Alpha-1 antitrypsin normal. ANA negative. Mitochondial ab 24.4; equivocal. AsMA negative.  Acetylcholine receptor antibody negative. Anti-HMGCR Ab negative.  Home medications to include Plaquenil, statin, and Protonix were held for concern of toxic myositis.  Patient received 5-day course of IV Solu-Medrol 1 g followed by steroid taper with improvement of CK 31,948 to 622 and Myoglobin >30K to 2121.  Palliative care was consulted given her significant continued decline and patient was transitioned to comfort measures.  Continue pain control with morphine, anxiety/agitation with lorazepam/Haldol.  Scopolamine patch for  secretions.  Discharging to [facility].  Elevated LFTs: resolved AST 525 and ALT 252 on admission. Acute hepatitis panel nonreactive.  GGT 17, within normal limits.  Ultrasound with Doppler unrevealing. Etiology likely secondary to perihepatitis from autoimmune/inflammatory myositis.  Transaminitis resolved with IV steroid treatment.  Acute hypoxic respiratory failure Aspiration pneumonia Etiology likely secondary to severe muscle dysfunction from underlying rhabdomyolysis causing severe dysphagia.  Chest x-ray 07/18/2020 with significant infiltrate right lower lobe consistent with aspiration.  WBC count peaked at 37.5.  Patient was started on azithromycin and Unasyn with Mucomyst, scopolamine patch.  Now transitioned to comfort measures, discontinued IV antibiotic treatment.  Dysphagia EGD 07/18/2020 with no findings for esophageal stricture.  Core track initially placed for tube feeds.  Met with palliative care, now transitioned to comfort measures and discontinued artificial feeding.  Acute on chronic iron deficiency anemia with melena Recent H. pylori infection, s/p treatment Recent EGD 06/17/2020 by [facility] with 2 nonbleeding duodenal ulcers with biopsy positive for H. pylori.  Completed antibiotic treatment.  Ferritin level normal, iron level 11 (low), elevated reticulocyte count, B12 608, folate 18, Coombs test negative. Discontinued home Eliquis.  Received 2 unit PRBC on 07/16/2020.  Evaluated by GI during hospitalization with no further recommendations and signed off.  Now transitionined to comfort measures as above.  Acute on chronic diastolic congestive heart failure Initially treated with IV albumin followed by IV furosemide.  TTE 07/02/2020 with LVEF 79-15%, grade 1 diastolic dysfunction.  Comfort measures as above.  Hypernatremia Etiology likely secondary to poor oral intake/dehydration from dysphagia.  Initially placed on tube feeds with free water supplementation, now  transitioned to comfort measures.  Recent LLE DVT Vascular duplex ultrasound 07/03/2020 with findings consistent with acute deep vein thrombosis involving the left posterior tibial veins, left peroneal veins, and left gastrocnemius veins; repeat vascular ultrasound 07/12/2020 with findings of persistent DVT.  Discontinued anticoagulation as transitioned  to comfort measures.  Type 2 diabetes mellitus Hemoglobin A1c 6.1, well controlled.  Discontinuing CBGs and insulin has now transitioned to comfort measures.  Hyperlipidemia Discontinued home simvastatin 80 mg PO QHS 2/2 myositis and transaminitis as above  Rheumatoid arthritis On prednisone 5 mg p.o. daily, Plaquenil 200 mg p.o. twice daily, methotrexate 64m weekly (Sunday).  Discontinue home medications as transition to comfort measures.  CAD s/p CABG Essential Hypertension  Discharge Diagnoses:  Principal Problem:   Rhabdomyolysis Active Problems:   Essential hypertension   Mixed hyperlipidemia   Peripheral vascular disease (HCC)   CAD (coronary artery disease) of artery bypass graft   Rheumatoid arthritis (HCC)   Chronic diastolic CHF (congestive heart failure) (HCC)   Type 2 diabetes mellitus with diabetic polyneuropathy, with long-term current use of insulin (HCC)   Myositis   Constipation   LFT elevation   Malnutrition of moderate degree   Diffuse pain   Severe muscle deconditioning   Goals of care, counseling/discussion   Terminal care   DNR (do not resuscitate)   Palliative care by specialist    Discharge Instructions  Discharge Instructions    Diet - low sodium heart healthy   Complete by: As directed    Increase activity slowly   Complete by: As directed    No wound care   Complete by: As directed      Allergies as of 07/22/2020   No Known Allergies     Medication List    STOP taking these medications   allopurinol 100 MG tablet Commonly known as: ZYLOPRIM   apixaban 5 MG Tabs  tablet Commonly known as: ELIQUIS   DERMACLOUD EX   folic acid 1 MG tablet Commonly known as: FOLVITE   hydroxychloroquine 200 MG tablet Commonly known as: PLAQUENIL   insulin aspart 100 UNIT/ML injection Commonly known as: novoLOG   insulin glargine 100 UNIT/ML injection Commonly known as: LANTUS   melatonin 3 MG Tabs tablet   metoCLOPramide 5 MG tablet Commonly known as: REGLAN   metoprolol tartrate 25 MG tablet Commonly known as: LOPRESSOR   ONE TOUCH ULTRA TEST test strip Generic drug: glucose blood   pantoprazole 40 MG tablet Commonly known as: PROTONIX   predniSONE 5 MG tablet Commonly known as: DELTASONE   senna-docusate 8.6-50 MG tablet Commonly known as: Senokot-S   simvastatin 80 MG tablet Commonly known as: ZOCOR   tiZANidine 2 MG tablet Commonly known as: ZANAFLEX     TAKE these medications   glycopyrrolate 0.2 MG/ML injection Commonly known as: ROBINUL Inject 2 mLs (0.4 mg total) into the vein every 4 (four) hours.   haloperidol lactate 5 MG/ML injection Commonly known as: HALDOL Inject 0.4 mLs (2 mg total) into the vein every 6 (six) hours as needed (aggitation).   LORazepam 2 MG/ML injection Commonly known as: ATIVAN Inject 0.25-0.5 mLs (0.5-1 mg total) into the vein every hour as needed for anxiety, seizure or sedation.   morphine 2 MG/ML injection Inject 1 mL (2 mg total) into the vein every hour as needed for up to 5 days.   ondansetron 4 MG/2ML Soln injection Commonly known as: ZOFRAN Inject 2 mLs (4 mg total) into the vein every 6 (six) hours as needed for nausea.   scopolamine 1 MG/3DAYS Commonly known as: TRANSDERM-SCOP Place 1 patch (1.5 mg total) onto the skin every 3 (three) days.       No Known Allergies  Consultations:  Neurology  Palliative care   Procedures/Studies: DG Chest 1 View  Result Date: 07/02/2020 CLINICAL DATA:  Fall EXAM: CHEST  1 VIEW COMPARISON:  June 14, 2020 FINDINGS: The heart size and  mediastinal contours unchanged with mild cardiomegaly. Aortic knob and mitral valve calcifications are seen. Overlying median sternotomy wires are present. Both lungs are clear. The visualized skeletal structures are unremarkable. IMPRESSION: No active disease. Electronically Signed   By: Prudencio Pair M.D.   On: 07/02/2020 00:17   DG Pelvis 1-2 Views  Result Date: 07/02/2020 CLINICAL DATA:  Fall EXAM: PELVIS - 1-2 VIEW COMPARISON:  None. FINDINGS: The patient is status post bilateral total hip arthroplasty. No periprosthetic lucency or fracture is identified. There are dense vascular calcifications. Diffuse osteopenia seen. Fixation hardware in the lower lumbar spine. IMPRESSION: Status post bilateral total hip arthroplasty without definite acute complication. Electronically Signed   By: Prudencio Pair M.D.   On: 07/02/2020 00:19   CT Lumbar Spine Wo Contrast  Result Date: 07/01/2020 CLINICAL DATA:  Lower back pain, trauma EXAM: CT LUMBAR SPINE WITHOUT CONTRAST TECHNIQUE: Multidetector CT imaging of the lumbar spine was performed without intravenous contrast administration. Multiplanar CT image reconstructions were also generated. COMPARISON:  None. FINDINGS: Segmentation: There are 5 non-rib bearing lumbar type vertebral bodies with the last intervertebral disc space labeled as L5-S1. Alignment: There is a minimal leftward curvature of the lumbar spine. Vertebrae: There is an age indeterminate, however likely subacute compression fracture at the inferior endplate of L1 with less than 25% loss in height. There is mildly displaced fracture seen through the anteroinferior right-sided endplate. There is buckling of the posteroinferior endplate causing mild canal narrowing. There is diffuse osteopenia. Interbody fixation device is seen at L4-L5. Paraspinal and other soft tissues: The paraspinal soft tissues and visualized retroperitoneal structures are unremarkable. The sacroiliac joints are intact. Scattered  dense vascular calcifications are noted. Low-density lesion seen partially exophytic the upper pole scattered colonic diverticula noted Disc levels: Multilevel lumbar spine spondylosis is seen most notable at L5-S1 with facet arthropathy causing severe right and moderate left neural foraminal narrowing IMPRESSION: Age indeterminate, however likely subacute inferior compression fracture of the L1 vertebral body with less than 25% height. There is buckling of the posteroinferior cortex causing mild canal narrowing Electronically Signed   By: Prudencio Pair M.D.   On: 07/01/2020 22:59   CT ABDOMEN PELVIS W CONTRAST  Result Date: 07/16/2020 CLINICAL DATA:  Anemia EXAM: CT ABDOMEN AND PELVIS WITH CONTRAST TECHNIQUE: Multidetector CT imaging of the abdomen and pelvis was performed using the standard protocol following bolus administration of intravenous contrast. CONTRAST:  122m OMNIPAQUE IOHEXOL 300 MG/ML  SOLN COMPARISON:  07/16/2020, 07/12/2020 FINDINGS: Lower chest: There are small bilateral pleural effusions. New right lower lobe airspace disease which could reflect aspiration or pneumonia. Minimal dependent lower lobe atelectasis. Dense calcification of the mitral valve unchanged. Hepatobiliary: Calcified gallstones are again identified without cholecystitis. Liver is unremarkable. Pancreas: Unremarkable. No pancreatic ductal dilatation or surrounding inflammatory changes. Spleen: Normal in size without focal abnormality. Adrenals/Urinary Tract: Stable right renal cyst. Otherwise the kidneys enhance normally. The adrenals and bladder are unremarkable. Stomach/Bowel: Dense oral contrast is seen throughout the colon. No bowel obstruction or ileus. No wall thickening or inflammatory change. Vascular/Lymphatic: Aortic atherosclerosis. No enlarged abdominal or pelvic lymph nodes. Reproductive: Status post hysterectomy. No adnexal masses. Other: No free fluid or free gas.  No abdominal wall hernia. Musculoskeletal:  Chronic compression deformities are seen at T11 and L1. Stable postsurgical changes at the L4/L5 level. Bilateral hip arthroplasties are stable. There  are no acute bony abnormalities. Reconstructed images demonstrate no additional findings. IMPRESSION: 1. Right lower lobe airspace disease which could reflect infection or aspiration. 2. Stable small bilateral pleural effusions. 3. Cholelithiasis without cholecystitis. 4.  Aortic Atherosclerosis (ICD10-I70.0). Electronically Signed   By: Randa Ngo M.D.   On: 07/16/2020 23:42   CT ABDOMEN PELVIS W CONTRAST  Result Date: 07/12/2020 CLINICAL DATA:  Abdominal distention EXAM: CT ABDOMEN AND PELVIS WITH CONTRAST TECHNIQUE: Multidetector CT imaging of the abdomen and pelvis was performed using the standard protocol following bolus administration of intravenous contrast. CONTRAST:  148m OMNIPAQUE IOHEXOL 300 MG/ML  SOLN COMPARISON:  None. FINDINGS: Lower chest: Trace bilateral pleural effusions. Dependent atelectasis. Densely calcified mitral valve. Hepatobiliary: Small gallstones layering within the gallbladder. No focal hepatic abnormality. Pancreas: No focal abnormality or ductal dilatation. Spleen: No focal abnormality.  Normal size. Adrenals/Urinary Tract: Small cyst off the upper pole of the right kidney. No hydronephrosis. No renal or ureteral stones. Urinary bladder and adrenal glands unremarkable. Stomach/Bowel: Few scattered left colonic diverticula. No active diverticulitis. Stomach and small bowel decompressed. No bowel obstruction. Vascular/Lymphatic: Aortic atherosclerosis. No evidence of aneurysm or adenopathy. Reproductive: Prior hysterectomy.  No adnexal masses. Other: No free fluid or free air. Musculoskeletal: Bilateral hip replacements. Postoperative changes in the lower lumbar spine. Mild compression fracture through the inferior endplate of L1 and superior endplate of TH99 IMPRESSION: Trace bilateral pleural effusions.  Bibasilar atelectasis.  Cholelithiasis. Aortic atherosclerosis. No acute findings in the abdomen or pelvis. Electronically Signed   By: KRolm BaptiseM.D.   On: 07/12/2020 02:14   VAS UKoreaIVC/ILIAC (VENOUS ONLY)  Result Date: 07/03/2020 IVC/ILIAC STUDY Limitations: Air/bowel gas.  Comparison Study: 06/09/2020- negative right lower extremity venous duplex Performing Technologist: MMaudry MayhewRDMS, RVT, RDCS  Examination Guidelines: A complete evaluation includes B-mode imaging, spectral Doppler, color Doppler, and power Doppler as needed of all accessible portions of each vessel. Bilateral testing is considered an integral part of a complete examination. Limited examinations for reoccurring indications may be performed as noted.  +----------------+---------+-----------+---------+-----------+--------+       CIV       RT-PatentRT-ThrombusLT-PatentLT-ThrombusComments +----------------+---------+-----------+---------+-----------+--------+ Common Iliac Mid patent              patent                      +----------------+---------+-----------+---------+-----------+--------+  +-----------------------+---------+-----------+---------+-----------+--------+           EIV          RT-PatentRT-ThrombusLT-PatentLT-ThrombusComments +-----------------------+---------+-----------+---------+-----------+--------+ External Iliac Vein Mid patent              patent                      +-----------------------+---------+-----------+---------+-----------+--------+   Summary: IVC/Iliac: No evidence of thrombus in common and external iliac veins. Unable to visualize IVC.  *See table(s) above for measurements and observations.  Electronically signed by CRuta HindsMD on 07/03/2020 at 6:20:18 PM.   Final    DG CHEST PORT 1 VIEW  Result Date: 07/19/2020 CLINICAL DATA:  Shortness of breath. EXAM: PORTABLE CHEST 1 VIEW COMPARISON:  November 9 21 FINDINGS: Similar cardiomediastinal silhouette. Postsurgical change with median  sternotomy. Aortic atherosclerosis. Similar positioning of the right PICC. Enteric tube courses below the diaphragm and outside the field of view. Similar right basilar airspace opacities. No acute osseous abnormality. IMPRESSION: Similar right basilar airspace opacities, compatible with pneumonia and/or aspiration. Electronically Signed   By: FMargaretha SheffieldMD  On: 07/19/2020 10:11   DG CHEST PORT 1 VIEW  Result Date: 07/18/2020 CLINICAL DATA:  Shortness of breath EXAM: PORTABLE CHEST 1 VIEW COMPARISON:  07/15/2020 FINDINGS: Cardiac shadow is stable. Postsurgical changes are noted. Aortic calcifications are seen. Feeding catheter extends at least into the distal stomach. Significant increased airspace opacity is noted in the right base consistent with acute pneumonia. No sizable effusion is seen. No bony abnormality is noted. IMPRESSION: Findings consistent with acute right basilar pneumonia. Electronically Signed   By: Inez Catalina M.D.   On: 07/18/2020 13:57   DG CHEST PORT 1 VIEW  Result Date: 07/15/2020 CLINICAL DATA:  Short of breath. EXAM: PORTABLE CHEST 1 VIEW COMPARISON:  07/13/2020 FINDINGS: Previous median sternotomy and CABG procedure. Aortic atherosclerotic calcifications. Mitral annular calcifications. Bilateral pleural effusions are noted, right greater than left. Pulmonary vascular congestion and trace edema. No airspace densities. IMPRESSION: 1. Mild congestive heart failure. 2.  Aortic Atherosclerosis (ICD10-I70.0). Electronically Signed   By: Kerby Moors M.D.   On: 07/15/2020 11:19   DG CHEST PORT 1 VIEW  Result Date: 07/13/2020 CLINICAL DATA:  Shortness of breath.  Weakness. EXAM: PORTABLE CHEST 1 VIEW COMPARISON:  07/01/2020 FINDINGS: Midline trachea. Mild cardiomegaly. Atherosclerosis in the transverse aorta. Mitral annular calcifications. Median sternotomy for CABG. No pleural fluid. Biapical pleuroparenchymal scarring. No congestive failure. Mild scarring or atelectasis at  the lateral left lung base. IMPRESSION: Cardiomegaly, without congestive failure. Aortic Atherosclerosis (ICD10-I70.0). Electronically Signed   By: Abigail Miyamoto M.D.   On: 07/13/2020 10:41   DG Abd Portable 1V  Result Date: 07/18/2020 CLINICAL DATA:  Constipation, check feeding catheter placement EXAM: PORTABLE ABDOMEN - 1 VIEW COMPARISON:  07/17/2019 FINDINGS: Feeding catheter is noted within the distal stomach/proximal duodenum. Contrast material is noted throughout the colon similar to that seen on the prior exam. Postsurgical changes are noted. No significant findings of constipation are seen. No free air is noted. IMPRESSION: Feeding catheter as described. No other focal abnormality is seen. Electronically Signed   By: Inez Catalina M.D.   On: 07/18/2020 13:58   DG Abd Portable 1V  Result Date: 07/16/2020 CLINICAL DATA:  Constipation EXAM: PORTABLE ABDOMEN - 1 VIEW COMPARISON:  CT dated July 12, 2020 FINDINGS: Oral contrast is noted in the colon. The bowel gas pattern is nonobstructive. There are degenerative changes of the spine. The patient is status post prior total hip arthroplasty. IMPRESSION: Oral contrast is noted in the colon. Nonobstructive bowel gas pattern. Electronically Signed   By: Constance Holster M.D.   On: 07/16/2020 18:57   DG Swallowing Func-Speech Pathology  Result Date: 07/13/2020 Objective Swallowing Evaluation: Type of Study: MBS-Modified Barium Swallow Study  Patient Details Name: ZARYA LASSEIGNE MRN: [id number] Date of Birth: 1935-05-28 Today's Date: 07/13/2020 Time: SLP Start Time (ACUTE ONLY): 1232 -SLP Stop Time (ACUTE ONLY): 1253 SLP Time Calculation (min) (ACUTE ONLY): 21 min Past Medical History: Past Medical History: Diagnosis Date . Anemia  . Anemia 06/14/2020 . Anxiety  . Arthritis  . Blood transfusion  . Chest pain   a. Lexiscan Myoview 12/12:  EF 75%, no ischemia or scar;  b.  Echo 12/12:  EF 60-65%, no sig valvular abnormalities  . Closed left fibular fracture    Casted  . Complication of anesthesia 1998  Unable to arouse fully, respiratory depression, pt. reports that the family was told that she would need a trach., but then she stabilized & didn't need it  . Coronary artery disease   sees  Dr Aundra Dubin every 6 months . Depression  . Diabetes mellitus   Greater than 20 years type 2  . GERD (gastroesophageal reflux disease)  . H/O hiatal hernia  . Hypercholesteremia  . Hypertension   Greater than 20 years . Lumbar disc disease   S/p surgery x4;  Sciatica  . Neuropathy due to secondary diabetes ([place])   Hx: of . Parastomal hernia of ileal conduit 2009 . Pneumonia   s/p CABG . [name] (peripheral vascular disease) (HCC)   Stable claudication. Arteriogram 07/2009 no significant aortoiliac disease, has bilateral SFA occlusions with distal reconstitution  . Rheumatoid arthritis ([place])   Dr Berna Bue @ [facility] . Transfusion reaction 1960  Convulsions (? seizure) . UTI (lower urinary tract infection) 01/03/15 Past Surgical History: Past Surgical History: Procedure Laterality Date . ABDOMINAL HYSTERECTOMY   . BACK SURGERY    4 back operations most recently 1995, 1998 . BIOPSY  06/17/2020  Procedure: BIOPSY;  Surgeon: Yetta Flock, MD;  Location: [facility] ENDOSCOPY;  Service: Gastroenterology;; . CARDIAC CATHETERIZATION    Hx: of 05/14/13 . CATARACT EXTRACTION W/ INTRAOCULAR LENS  IMPLANT, BILATERAL    Hx: of . COLONOSCOPY    Hx; of . COLONOSCOPY WITH PROPOFOL N/A 06/17/2020  Procedure: COLONOSCOPY WITH PROPOFOL;  Surgeon: Yetta Flock, MD;  Location: [facility] ENDOSCOPY;  Service: Gastroenterology;  Laterality: N/A; . CORONARY ARTERY BYPASS GRAFT N/A 05/24/2013  Procedure: CORONARY ARTERY BYPASS GRAFTING (CABG);  Surgeon: Melrose Nakayama, MD;  Location: [place];  Service: Open Heart Surgery;  Laterality: N/A; . DILATION AND CURETTAGE OF UTERUS   . ESOPHAGOGASTRODUODENOSCOPY (EGD) WITH PROPOFOL N/A 06/17/2020  Procedure: ESOPHAGOGASTRODUODENOSCOPY (EGD) WITH PROPOFOL;  Surgeon:  Yetta Flock, MD;  Location: [place];  Service: Gastroenterology;  Laterality: N/A; . HEMOSTASIS CLIP PLACEMENT  06/17/2020  Procedure: HEMOSTASIS CLIP PLACEMENT;  Surgeon: Yetta Flock, MD;  Location: [facility] ENDOSCOPY;  Service: Gastroenterology;; . HERNIA REPAIR  2009 . LEFT HEART CATHETERIZATION WITH CORONARY ANGIOGRAM N/A 05/14/2013  Procedure: LEFT HEART CATHETERIZATION WITH CORONARY ANGIOGRAM;  Surgeon: Larey Dresser, MD;  Location: [facility] CATH LAB;  Service: Cardiovascular;  Laterality: N/A; . POLYPECTOMY  06/17/2020  Procedure: POLYPECTOMY;  Surgeon: Yetta Flock, MD;  Location: [facility] ENDOSCOPY;  Service: Gastroenterology;; . TONSILLECTOMY   . TOTAL HIP ARTHROPLASTY Left 01/10/2015  Procedure: TOTAL HIP ARTHROPLASTY ANTERIOR APPROACH;  Surgeon: Melrose Nakayama, MD;  Location: [place];  Service: Orthopedics;  Laterality: Left; . TOTAL HIP ARTHROPLASTY Right 05/30/2020  Procedure: RIGHT TOTAL HIP ARTHROPLASTY ANTERIOR APPROACH;  Surgeon: Melrose Nakayama, MD;  Location: [facility]S;  Service: Orthopedics;  Laterality: Right; HPI: Pt is an 85 y.o. female with medical history significant of RA, [name], DM, HTN, [name], and CAD s/p CABG who presented with leg pain.  She had physical therapy sessions and became very weak came back to the hospital was found to have a bleeding ulcer and a DVT. Per neurology's note, pt has had an insidious slow progressive onset of weakness for 3-4 weeks concerning for myopathy. SLP was consulted since dysphagia may be related to myopathy.  No data recorded Assessment / Plan / Recommendation CHL IP CLINICAL IMPRESSIONS 07/13/2020 Clinical Impression Pt's positioning was suboptimal due to her inability to hold her head in an upright position d/t reported weakness. Without physical assistance from SLP and radiology tech, pt maintained a chin tuck posture. Pt exhibited difficulty with bolus propulsion, increased pharyngeal residue, and absent epiglottic inversion in this position, all  increasing aspiration risk. Pt presented with  oropharyngeal dysphagia characterized by reduced lingual retraction, reduced anterior laryngeal movement, reduced pharyngeal constriction, and aspiration (PAS 7) of nectar thick liquids secondary to spillover of pyriform sinus residue and delayed swallow initiation. Laryngeal sensation was adequate but coughing was ineffective. She demonstrated moderate vallecular residue, mild posterior pharyngeal wall residue, and mild-moderate pyriform sinus residue. A barium tablet was attempted, but pt refused stating that she needs her pills to be crushed since she is fearful of "choking" on them. Pt stated that she needs her meats to be cut "smaller than a fingernail" for her to be able to masticate them. Pt's diet will be downgraded to dysphagia 2 solids and thin liquids. 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 With support to keep her head in an upright position, her swallow function was improved and risk reduced. The results of the study and the potential use a soft cervical collar to help support pt's neck, and therefore facilitate use of an upright head position, were discussed with Dr. Posey Pronto and he indicated that he will order it. SLP will follow pt. SLP Visit Diagnosis Dysphagia, unspecified (R13.10) Attention and concentration deficit following -- Frontal lobe and executive function deficit following -- Impact on safety and function Mild aspiration risk   CHL IP TREATMENT RECOMMENDATION 07/13/2020 Treatment Recommendations Therapy as outlined in treatment plan below   Prognosis 07/13/2020 Prognosis for Safe Diet Advancement Good Barriers to Reach Goals Severity of deficits Barriers/Prognosis Comment -- CHL IP DIET RECOMMENDATION 07/13/2020 SLP Diet Recommendations Dysphagia 2 (Fine chop) solids;Thin liquid Liquid Administration via Cup;Straw  Medication Administration Crushed with puree Compensations Slow rate;Small sips/bites Postural Changes Seated upright at 90 degrees   CHL IP OTHER RECOMMENDATIONS 07/13/2020 Recommended Consults -- Oral Care Recommendations Oral care BID;Staff/trained caregiver to provide oral care Other Recommendations --   CHL IP FOLLOW UP RECOMMENDATIONS 07/13/2020 Follow up Recommendations (No Data)   CHL IP FREQUENCY AND DURATION 07/13/2020 Speech Therapy Frequency (ACUTE ONLY) min 2x/week Treatment Duration 2 weeks      CHL IP ORAL PHASE 07/13/2020 Oral Phase Impaired Oral - Pudding Teaspoon -- Oral - Pudding Cup -- Oral - Honey Teaspoon -- Oral - Honey Cup Reduced posterior propulsion Oral - Nectar Teaspoon -- Oral - Nectar Cup Reduced posterior propulsion Oral - Nectar Straw Reduced posterior propulsion Oral - Thin Teaspoon -- Oral - Thin Cup -- Oral - Thin Straw Reduced posterior propulsion Oral - Puree Reduced posterior propulsion Oral - Mech Soft Reduced posterior propulsion Oral - Regular Reduced posterior propulsion Oral - Multi-Consistency -- Oral - Pill -- Oral Phase - Comment --  CHL IP PHARYNGEAL PHASE 07/13/2020 Pharyngeal Phase Impaired Pharyngeal- Pudding Teaspoon -- Pharyngeal -- Pharyngeal- Pudding Cup -- Pharyngeal -- Pharyngeal- Honey Teaspoon -- Pharyngeal -- Pharyngeal- Honey Cup Pharyngeal residue - valleculae;Pharyngeal residue - pyriform;Pharyngeal residue - posterior pharnyx;Reduced anterior laryngeal mobility;Reduced epiglottic inversion Pharyngeal -- Pharyngeal- Nectar Teaspoon -- Pharyngeal -- Pharyngeal- Nectar Cup -- Pharyngeal -- Pharyngeal- Nectar Straw Pharyngeal residue - valleculae;Pharyngeal residue - pyriform;Pharyngeal residue - posterior pharnyx;Reduced anterior laryngeal mobility;Reduced epiglottic inversion;Moderate aspiration;Penetration/Aspiration during swallow;Penetration/Apiration after swallow Pharyngeal Material enters airway, passes BELOW cords and not ejected out despite cough  attempt by patient Pharyngeal- Thin Teaspoon -- Pharyngeal -- Pharyngeal- Thin Cup -- Pharyngeal -- Pharyngeal- Thin Straw Pharyngeal residue - valleculae;Pharyngeal residue - pyriform;Pharyngeal residue - posterior pharnyx;Reduced anterior laryngeal mobility;Reduced epiglottic inversion Pharyngeal -- Pharyngeal- Puree Pharyngeal residue -  valleculae;Pharyngeal residue - pyriform;Pharyngeal residue - posterior pharnyx;Reduced anterior laryngeal mobility;Reduced epiglottic inversion Pharyngeal -- Pharyngeal- Mechanical Soft Pharyngeal residue - valleculae;Pharyngeal residue - pyriform;Pharyngeal residue - posterior pharnyx;Reduced anterior laryngeal mobility;Reduced epiglottic inversion Pharyngeal -- Pharyngeal- Regular Pharyngeal residue - valleculae;Pharyngeal residue - pyriform;Pharyngeal residue - posterior pharnyx;Reduced anterior laryngeal mobility;Reduced epiglottic inversion Pharyngeal -- Pharyngeal- Multi-consistency -- Pharyngeal -- Pharyngeal- Pill -- Pharyngeal -- Pharyngeal Comment --  Shanika I. Hardin Negus, [place], [place] Office number [phone] Pager [phone] No flowsheet data found. Horton Marshall 07/13/2020, 3:11 PM              ECHOCARDIOGRAM COMPLETE  Result Date: 07/02/2020    ECHOCARDIOGRAM REPORT   Patient Name:   CALIN ELLERY Date of Exam: 07/02/2020 Medical Rec #:  [id number]      Height:       62.0 in Accession #:    [id number]     Weight:       139.0 lb Date of Birth:  [birth year]      BSA:          1.638 m Patient Age:    39 years       BP:           94/49 mmHg Patient Gender: F              HR:           71 bpm. Exam Location:  Inpatient Procedure: 2D Echo Indications:    abnormal ecg 794.31  History:        Patient has prior history of Echocardiogram examinations, most                 recent 07/26/2019. CAD, Prior CABG, Signs/Symptoms:leg edema;                 Risk Factors:Diabetes and Dyslipidemia.  Sonographer:    Johny Chess Referring  Phys: [id number] [place]  1. Left ventricular ejection fraction, by estimation, is 60 to 65%. The left ventricle has normal function. The left ventricle has no regional wall motion abnormalities. Left ventricular diastolic parameters are consistent with Grade I diastolic dysfunction (impaired relaxation).  2. Right ventricular systolic function is normal. The right ventricular size is normal. There is mildly elevated pulmonary artery systolic pressure.  3. Left atrial size was moderately dilated.  4. The mitral valve is degenerative. Moderate mitral valve regurgitation. Severe mitral annular calcification.  5. Tricuspid valve regurgitation is mild to moderate.  6. The aortic valve is tricuspid. There is mild calcification of the aortic valve. There is mild thickening of the aortic valve. Aortic valve regurgitation is trivial. Mild to moderate aortic valve sclerosis/calcification is present, without any evidence of aortic stenosis.  7. Aortic dilatation noted. There is mild dilatation of the ascending aorta, measuring 40 mm.  8. The inferior vena cava is normal in size with greater than 50% respiratory variability, suggesting right atrial pressure of 3 mmHg. Comparison(s): Changes from prior study are noted. Conclusion(s)/Recommendation(s): Mitral regurgitation now appears more prominent than prior study. FINDINGS  Left Ventricle: Left ventricular ejection fraction, by estimation, is 60 to 65%. The left ventricle has normal function. The left ventricle has no regional wall motion abnormalities. The left ventricular internal cavity size was normal in size. There is  no left ventricular hypertrophy. Left ventricular diastolic parameters are consistent with Grade I diastolic dysfunction (impaired relaxation). Right Ventricle: The right ventricular size is normal. No increase in right ventricular wall thickness. Right  ventricular systolic function is normal. There is mildly elevated pulmonary artery  systolic pressure. The tricuspid regurgitant velocity is 3.15  m/s, and with an assumed right atrial pressure of 3 mmHg, the estimated right ventricular systolic pressure is 16.1 mmHg. Left Atrium: Left atrial size was moderately dilated. Right Atrium: Right atrial size was normal in size. Pericardium: There is no evidence of pericardial effusion. Mitral Valve: The mitral valve is degenerative in appearance. There is mild thickening of the mitral valve leaflet(s). There is mild calcification of the mitral valve leaflet(s). Severe mitral annular calcification. Moderate mitral valve regurgitation. MV peak gradient, 13.4 mmHg. The mean mitral valve gradient is 6.0 mmHg. Tricuspid Valve: The tricuspid valve is normal in structure. Tricuspid valve regurgitation is mild to moderate. Aortic Valve: The aortic valve is tricuspid. There is mild calcification of the aortic valve. There is mild thickening of the aortic valve. Aortic valve regurgitation is trivial. Mild to moderate aortic valve sclerosis/calcification is present, without any evidence of aortic stenosis. Pulmonic Valve: The pulmonic valve was not well visualized. Pulmonic valve regurgitation is trivial. Aorta: Aortic dilatation noted. There is mild dilatation of the ascending aorta, measuring 40 mm. Venous: The inferior vena cava is normal in size with greater than 50% respiratory variability, suggesting right atrial pressure of 3 mmHg. IAS/Shunts: The atrial septum is grossly normal.  LEFT VENTRICLE PLAX 2D LVIDd:         3.60 cm  Diastology LVIDs:         2.60 cm  LV e' medial:    5.87 cm/s LV PW:         1.00 cm  LV E/e' medial:  18.7 LV IVS:        1.00 cm  LV e' lateral:   8.81 cm/s LVOT diam:     2.00 cm  LV E/e' lateral: 12.5 LV SV:         75 LV SV Index:   46 LVOT Area:     3.14 cm  RIGHT VENTRICLE             IVC RV S prime:     12.80 cm/s  IVC diam: 1.40 cm TAPSE (M-mode): 1.8 cm LEFT ATRIUM             Index       RIGHT ATRIUM           Index LA  diam:        3.90 cm 2.38 cm/m  RA Area:     12.40 cm LA Vol (A2C):   61.6 ml 37.61 ml/m RA Volume:   26.50 ml  16.18 ml/m LA Vol (A4C):   76.2 ml 46.53 ml/m LA Biplane Vol: 69.6 ml 42.50 ml/m  AORTIC VALVE LVOT Vmax:   93.80 cm/s LVOT Vmean:  60.000 cm/s LVOT VTI:    0.238 m  AORTA Ao Root diam: 3.10 cm Ao Asc diam:  4.00 cm MITRAL VALVE                TRICUSPID VALVE MV Area (PHT): 1.94 cm     TR Peak grad:   39.7 mmHg MV Peak grad:  13.4 mmHg    TR Vmax:        315.00 cm/s MV Mean grad:  6.0 mmHg MV Vmax:       1.83 m/s     SHUNTS MV Vmean:      113.0 cm/s   Systemic VTI:  0.24 m MV Decel Time: 391 msec  Systemic Diam: 2.00 cm MR Peak grad: 119.7 mmHg MR Mean grad: 78.5 mmHg MR Vmax:      547.00 cm/s MR Vmean:     422.0 cm/s MV E velocity: 110.00 cm/s MV A velocity: 153.00 cm/s MV E/A ratio:  0.72 Buford Dresser MD Electronically signed by Buford Dresser MD Signature Date/Time: 07/02/2020/11:36:02 AM    Final    US LIVER DOPPLER  Result Date: 07/13/2020 CLINICAL DATA:  Elevated LFTs EXAM: DUPLEX ULTRASOUND OF LIVER TECHNIQUE: Color and duplex Doppler ultrasound was performed to evaluate the hepatic in-flow and out-flow vessels. COMPARISON:  07/12/2020 FINDINGS: Liver: Normal parenchymal echogenicity. Normal hepatic contour without nodularity. No focal lesion, mass or intrahepatic biliary ductal dilatation. Main Portal Vein size: 1.0 cm Portal Vein Velocities Main Prox:  54 cm/sec Main Mid: 51 cm/sec Main Dist:  45 cm/sec Right: 34 cm/sec Left: 22 cm/sec Hepatic Vein Velocities Right:  22 cm/sec Middle:  32 cm/sec Left:  40 cm/sec IVC: Present and patent with normal respiratory phasicity. Hepatic Artery Velocity:  57 cm/sec Splenic Vein Velocity:  19 cm/sec Spleen: 2.6 cm x 6.8 cm x 7.4 cm with a total volume of 70 cm^3 (411 cm^3 is upper limit normal) Portal Vein Occlusion/Thrombus: No Splenic Vein Occlusion/Thrombus: No Ascites: None Varices: None Patent portal, hepatic and splenic  veins with directional flow. Negative portal vein occlusion or thrombus. No ascites. Small left effusion noted. IMPRESSION: Normal hepatic venous Doppler. Electronically Signed   By: Jerilynn Mages.  Shick M.D.   On: 07/13/2020 08:24   DG FEMUR, MIN 2 VIEWS RIGHT  Result Date: 07/02/2020 CLINICAL DATA:  Fall EXAM: RIGHT FEMUR 2 VIEWS COMPARISON:  None. FINDINGS: The patient is status post right total hip arthroplasty. No periprosthetic lucency or fracture is seen. Overlying subcutaneous edema noted. Dense vascular calcifications are seen. IMPRESSION: Status post right total hip arthroplasty without acute complication. Electronically Signed   By: Prudencio Pair M.D.   On: 07/02/2020 00:20   VAS [place] LOWER EXTREMITY VENOUS (DVT)  Result Date: 07/12/2020  Lower Venous DVT Study Indications: Follow up dvt.  Comparison Study: 07/04/20 previous Performing Technologist: Abram Sander RVS  Examination Guidelines: A complete evaluation includes B-mode imaging, spectral Doppler, color Doppler, and power Doppler as needed of all accessible portions of each vessel. Bilateral testing is considered an integral part of a complete examination. Limited examinations for reoccurring indications may be performed as noted. The reflux portion of the exam is performed with the patient in reverse Trendelenburg.  +---------+---------------+---------+-----------+----------+--------------+ RIGHT    CompressibilityPhasicitySpontaneityPropertiesThrombus Aging +---------+---------------+---------+-----------+----------+--------------+ CFV      Full           Yes      Yes                                 +---------+---------------+---------+-----------+----------+--------------+ SFJ      Full                                                        +---------+---------------+---------+-----------+----------+--------------+ FV Prox  Full                                                         +---------+---------------+---------+-----------+----------+--------------+  FV Mid   Full                                                        +---------+---------------+---------+-----------+----------+--------------+ FV DistalFull                                                        +---------+---------------+---------+-----------+----------+--------------+ PFV      Full                                                        +---------+---------------+---------+-----------+----------+--------------+ POP      Full           Yes      Yes                                 +---------+---------------+---------+-----------+----------+--------------+ PTV      Full                                                        +---------+---------------+---------+-----------+----------+--------------+ PERO     Full                                                        +---------+---------------+---------+-----------+----------+--------------+   +---------+---------------+---------+-----------+----------+-----------------+ LEFT     CompressibilityPhasicitySpontaneityPropertiesThrombus Aging    +---------+---------------+---------+-----------+----------+-----------------+ CFV      Full           Yes      Yes                                    +---------+---------------+---------+-----------+----------+-----------------+ SFJ      Full                                                           +---------+---------------+---------+-----------+----------+-----------------+ FV Prox  Full                                                           +---------+---------------+---------+-----------+----------+-----------------+ FV Mid   Full                                                           +---------+---------------+---------+-----------+----------+-----------------+  FV DistalFull                                                            +---------+---------------+---------+-----------+----------+-----------------+ PFV      Full                                                           +---------+---------------+---------+-----------+----------+-----------------+ POP      Full           Yes      Yes                                    +---------+---------------+---------+-----------+----------+-----------------+ PTV      None                                         Age Indeterminate +---------+---------------+---------+-----------+----------+-----------------+ PERO     None                                         Age Indeterminate +---------+---------------+---------+-----------+----------+-----------------+ Gastroc  None                                         Age Indeterminate +---------+---------------+---------+-----------+----------+-----------------+     Summary: RIGHT: - There is no evidence of deep vein thrombosis in the lower extremity.  - No cystic structure found in the popliteal fossa.  LEFT: - Findings consistent with age indeterminate deep vein thrombosis involving the left posterior tibial veins, left peroneal veins, and left gastrocnemius veins. - No cystic structure found in the popliteal fossa.  *See table(s) above for measurements and observations. Electronically signed by Curt Jews MD on 07/12/2020 at 3:05:07 PM.    Final    VAS [place] LOWER EXTREMITY VENOUS (DVT)  Result Date: 07/04/2020  Lower Venous DVTStudy Indications: Pain, and Swelling.  Limitations: Poor ultrasound/tissue interface. Comparison Study: 06/09/2020- negative lower extremity venosu duplex Performing Technologist: Maudry Mayhew MHA, RDMS, RVT, RDCS  Examination Guidelines: A complete evaluation includes B-mode imaging, spectral Doppler, color Doppler, and power Doppler as needed of all accessible portions of each vessel. Bilateral testing is considered an integral part of a complete examination. Limited examinations for  reoccurring indications may be performed as noted. The reflux portion of the exam is performed with the patient in reverse Trendelenburg.  +---------+---------------+---------+-----------+----------+--------------+ RIGHT    CompressibilityPhasicitySpontaneityPropertiesThrombus Aging +---------+---------------+---------+-----------+----------+--------------+ CFV      Full           Yes      Yes                                 +---------+---------------+---------+-----------+----------+--------------+ SFJ      Full                                                        +---------+---------------+---------+-----------+----------+--------------+  FV Prox  Full                                                        +---------+---------------+---------+-----------+----------+--------------+ FV Mid   Full                                                        +---------+---------------+---------+-----------+----------+--------------+ FV DistalFull                                                        +---------+---------------+---------+-----------+----------+--------------+ PFV      Full                                                        +---------+---------------+---------+-----------+----------+--------------+ POP      Full           Yes      Yes                                 +---------+---------------+---------+-----------+----------+--------------+ PTV      Full                                                        +---------+---------------+---------+-----------+----------+--------------+   Right Technical Findings: Not visualized segments include peroneal veins.  +---------+---------------+---------+-----------+---------------+--------------+ LEFT     CompressibilityPhasicitySpontaneityProperties     Thrombus Aging +---------+---------------+---------+-----------+---------------+--------------+ CFV      Full           Yes      Yes                                       +---------+---------------+---------+-----------+---------------+--------------+ SFJ      Full                                                             +---------+---------------+---------+-----------+---------------+--------------+ FV Prox  Full                                                             +---------+---------------+---------+-----------+---------------+--------------+ FV Mid   Full                                                             +---------+---------------+---------+-----------+---------------+--------------+  FV Distal                        Yes        Patent by color                                                           Doppler                       +---------+---------------+---------+-----------+---------------+--------------+ PFV      Full                                                             +---------+---------------+---------+-----------+---------------+--------------+ POP      Full           Yes      Yes                                      +---------+---------------+---------+-----------+---------------+--------------+ PTV      None                    No                        Acute          +---------+---------------+---------+-----------+---------------+--------------+ PERO     None                    No                        Acute          +---------+---------------+---------+-----------+---------------+--------------+ Gastroc  None                    No                        Acute          +---------+---------------+---------+-----------+---------------+--------------+     Summary: RIGHT: - There is no evidence of deep vein thrombosis in the lower extremity. However, portions of this examination were limited- see technologist comments above.  - No cystic structure found in the popliteal fossa.  LEFT: - Findings consistent with acute deep vein thrombosis  involving the left posterior tibial veins, left peroneal veins, and left gastrocnemius veins. - No cystic structure found in the popliteal fossa.  *See table(s) above for measurements and observations. Electronically signed by Deitra Mayo MD on 07/04/2020 at 5:41:20 PM.    Final    [place] EKG SITE RITE  Result Date: 07/15/2020 If Site Rite image not attached, placement could not be confirmed due to current cardiac rhythm.     Subjective: Patient seen and examined bedside, resting comfortably.  Somnolent, will open eyes to command.  Discharging to beacon place today.  No family present at bedside this morning.    Discharge Exam: Vitals:   07/21/20 1503 07/22/20 0353  BP: (!) 164/77 (!) 155/81  Pulse: 87 98  Resp: (!) 25 (!) 22  Temp: (!) 97.5 F (36.4 C) 100.2 F (37.9 C)  SpO2: 93% 93%   Vitals:   07/20/20 1808 07/21/20 0602 07/21/20 1503 07/22/20 0353  BP: (!) 117/50 (!) 150/59 (!) 164/77 (!) 155/81  Pulse: 67 72 87 98  Resp: 17 18 (!) 25 (!) 22  Temp: 98.3 F (36.8 C) 98.5 F (36.9 C) (!) 97.5 F (36.4 C) 100.2 F (37.9 C)  TempSrc: Oral Axillary Oral Oral  SpO2: 96% 93% 93% 93%  Weight:      Height:        General: Pt is alert, awake, not in acute distress; somnolent, chronically ill/frail in appearance Cardiovascular: RRR, S1/S2 +, no rubs, no gallops Respiratory: Coarse breath sounds bilaterally, with intermittent apneic episodes noted Abdominal: Soft, NT, ND, bowel sounds + Extremities: no edema, no cyanosis    The results of significant diagnostics from this hospitalization (including imaging, microbiology, ancillary and laboratory) are listed below for reference.     Microbiology: Recent Results (from the past 240 hour(s))  Culture, Urine     Status: Abnormal   Collection Time: 07/16/20  4:29 PM   Specimen: Urine, Random  Result Value Ref Range Status   Specimen Description URINE, RANDOM  Final   Special Requests   Final    NONE Performed at  [facility], [address], [place], [place] [address]    Culture MULTIPLE SPECIES PRESENT, SUGGEST RECOLLECTION (A)  Final   Report Status 07/17/2020 FINAL  Final     Labs: BNP (last 3 results) Recent Labs    06/08/20 1630 06/14/20 1351 07/02/20 0415  BNP 177.6* 171.3* 657.8*   Basic Metabolic Panel: Recent Labs  Lab 07/16/20 0438 07/16/20 0902 07/16/20 1810 07/16/20 1810 07/17/20 0900 07/17/20 0900 07/17/20 1815 07/17/20 1815 07/18/20 0420 07/18/20 1220 07/18/20 2105 07/19/20 0222 07/20/20 0533  NA   < >  --  147*   < > 152*   < > 149*   < > 153* 154* 154* 152* 154*  K   < >  --  3.4*   < > 2.6*   < > 3.2*   < > 4.0 3.7 3.1* 3.2* 4.2  CL   < >  --  107   < > 110   < > 110   < > 114* 113* 110 110 112*  CO2   < >  --  26   < > 27   < > 26   < > 30 30 32 32 31  GLUCOSE   < >  --  192*   < > 250*   < > 321*   < > 421* 384* 368* 378* 381*  BUN   < >  --  38*   < > 41*   < > 43*   < > 47* 45* 45* 46* 45*  CREATININE   < >  --  0.63   < > 0.64   < > 0.65   < > 0.63 0.61 0.55 0.46 0.54  CALCIUM   < >  --  8.3*   < > 8.5*   < > 8.2*   < > 8.2* 8.1* 8.0* 8.0* 7.9*  MG  --  1.9 2.4  --   --   --  2.1  --   --   --   --   --  2.2  PHOS  --   --   --   --  3.9  --   --   --   --   --   --  1.9* 1.4*   < > = values in this interval not displayed.   Liver Function Tests: Recent Labs  Lab 07/16/20 0438 07/17/20 0900 07/18/20 0420 07/19/20 0222 07/20/20 0533  AST 193* 94* 58* 39 36  ALT 422* 294* 264* 221* 175*  ALKPHOS 54 48 53 54 55  BILITOT 1.2 1.9* 0.8 0.9 0.7  PROT 4.7* 4.7* 4.7* 4.5* 4.2*  ALBUMIN 2.6* 2.8* 2.6* 2.3* 2.1*   No results for input(s): LIPASE, AMYLASE in the last 168 hours. No results for input(s): AMMONIA in the last 168 hours. CBC: Recent Labs  Lab 07/17/20 0612 07/17/20 0900 07/18/20 0420 07/19/20 0222 07/20/20 0712  WBC 21.8* 21.1* 25.8* 19.5* 20.9*  HGB 9.3* 9.5* 10.1* 9.8* 9.5*  HCT 29.1* 29.6* 31.5* 31.2* 30.4*  MCV 97.7 98.3  99.1 100.6* 101.3*  PLT 128* 131* 130* 115* 98*   Cardiac Enzymes: Recent Labs  Lab 07/16/20 0438 07/17/20 0900 07/18/20 0420 07/19/20 0222 07/20/20 0533  CKTOTAL 3,921* 2,316* 1,006* 622* 424*   BNP: Invalid input(s): POCBNP CBG: Recent Labs  Lab 07/19/20 2324 07/19/20 2346 07/20/20 0334 07/20/20 0811 07/20/20 1112  GLUCAP 344* 374* 297* 255* 194*   D-Dimer No results for input(s): DDIMER in the last 72 hours. Hgb A1c No results for input(s): HGBA1C in the last 72 hours. Lipid Profile No results for input(s): CHOL, HDL, LDLCALC, TRIG, CHOLHDL, LDLDIRECT in the last 72 hours. Thyroid function studies No results for input(s): TSH, T4TOTAL, T3FREE, THYROIDAB in the last 72 hours.  Invalid input(s): FREET3 Anemia work up No results for input(s): VITAMINB12, FOLATE, FERRITIN, TIBC, IRON, RETICCTPCT in the last 72 hours. Urinalysis    Component Value Date/Time   COLORURINE YELLOW 07/16/2020 0827   APPEARANCEUR HAZY (A) 07/16/2020 0827   LABSPEC 1.008 07/16/2020 0827   PHURINE 5.0 07/16/2020 0827   GLUCOSEU NEGATIVE 07/16/2020 0827   HGBUR LARGE (A) 07/16/2020 0827   BILIRUBINUR NEGATIVE 07/16/2020 0827   KETONESUR NEGATIVE 07/16/2020 0827   PROTEINUR NEGATIVE 07/16/2020 0827   UROBILINOGEN 0.2 12/29/2014 1103   NITRITE NEGATIVE 07/16/2020 0827   LEUKOCYTESUR LARGE (A) 07/16/2020 0827   Sepsis Labs Invalid input(s): PROCALCITONIN,  WBC,  LACTICIDVEN Microbiology Recent Results (from the past 240 hour(s))  Culture, Urine     Status: Abnormal   Collection Time: 07/16/20  4:29 PM   Specimen: Urine, Random  Result Value Ref Range Status   Specimen Description URINE, RANDOM  Final   Special Requests   Final    NONE Performed at [facility], [address] [place], [place] [address]    Culture MULTIPLE SPECIES PRESENT, SUGGEST RECOLLECTION (A)  Final   Report Status 07/17/2020 FINAL  Final     Time coordinating discharge: Over 30  minutes  SIGNED:   Carriann Hesse J [place] DO  [facility] 07/22/2020, 9:51 AM

## 2020-07-22 NOTE — TOC Transition Note (Signed)
Transition of Care [facility]) - CM/SW Discharge Note   Patient Details  Name: Patricia Villegas MRN: [id number] Date of Birth: 02-17-1935  [facility] CM/SW Contact:  Bary Castilla, LCSW Phone Number: [phone] 07/22/2020, 10:51 AM   Clinical Narrative:    Patient will DC to:?[facility] Anticipated DC date:?07/22/20 Family notified:?Harry Transport by: Corey Harold   Per MD patient ready for DC to [facility].  RN, patient, patient's family, and facility notified of DC. Discharge Summary sent to facility. RN given number for report [phone]. DC packet on chart. Ambulance transport requested for patient.   CSW signing off.   [place], [place] [phone]    Final next level of care: [place] Barriers to Discharge: Barriers Resolved   Patient Goals and CMS Choice Patient states their goals for this hospitalization and ongoing recovery are:: Family wants [facility] CMS Medicare.gov Compare Post Acute Care list provided to:: Patient Choice offered to / list presented to : Patient  Discharge Placement              Patient chooses bed at:  [facility]) Patient to be transferred to facility by: [place] Name of family member notified: [name] son Patient and family notified of of transfer: 07/22/20  Discharge Plan and Services                                     Social Determinants of Health (SDOH) Interventions     Readmission Risk Interventions Readmission Risk Prevention Plan 07/15/2020  SW Recovery Care/Counseling Consult Complete  Skilled Nursing Facility Complete  Some recent data might be hidden

## 2020-07-22 NOTE — Progress Notes (Addendum)
Palliative Medicine Inpatient Follow Up Note  Reason for consult:   "59F admit with Rhabdo/polmyositis, ASp PNA, dysphagia now on cortrack. Please assist with GOC/MDM."  HPI:  Per intake H&P --> Patricia Villegas a 85 year old female with past medical history remarkable for rheumatoid arthritis, peripheral vascular disease, type 2 diabetes mellitus, essential hypertension, hyperlipidemia, CAD s/p CABG who presented to the ED with leg pain. Patient was noted to have severe rhabdomyolysis, anemia and transaminitis. Neurology and GI were consulted.  Palliative care was asked to get involved to further discuss goals of care in the setting of multiple chronic diseases and now recurrent aspiration pneumonia.   Today's Discussion (07/22/2020): Chart reviewed. I met with Patricia Villegas at bedside. She was sleeping and did appear to be comfortable and in no distress. The glycopyrrolate ATC helped aid in relief of her rattle and the haldol helped with agitation.   Patricia Villegas will not transition to [facility] and will pass in house.  Questions and concerns addressed   Objective Assessment: Vital Signs Vitals:   07/22/20 0353 07/22/20 1019  BP: (!) 155/81 137/73  Pulse: 98 (!) 115  Resp: (!) 22 (!) 24  Temp: 100.2 F (37.9 C) 98.2 F (36.8 C)  SpO2: 93% (!) 78%    Intake/Output Summary (Last 24 hours) at 07/22/2020 1140 Last data filed at 07/21/2020 2116 Gross per 24 hour  Intake 0 ml  Output 1575 ml  Net -1575 ml   Last Weight  Most recent update: 07/19/2020  3:35 AM   Weight  70.9 kg (156 lb 4.9 oz)           Gen:  Frail elderly female HEENT: Dry mucous membranes CV: Regular rate and rhythm, no murmurs rubs or gallops PULM: Dimished ABD: soft/nontender/nondistended/normal bowel sounds  EXT: (+) Generalized edema Neuro: Alert and oriented x4  SUMMARY OF RECOMMENDATIONS DNAR/DNI  Comfort focused [place]  Liberalize visitation restrictions given comfort  focus  TOC - Inpatient Hospice  Prognosis anticipate days  Code Status/Advance Care Planning: DNAR/DNI  Symptom Management: Dyspnea: Pain:                 - Morphine 58m IV Q1H PRN Fever:                 - Tylenol 6569mPO/PR Q6H PRN Agitation: Anxiety:  -Haldol 30m54mV Q6H                 - Lorazepam 0.5-1mg24m Q1H PRN Nausea:                 - Zofran 4mg 39mIV Q6H PRN             Secretions:                 - Atropine Patch  - Glyco Q4HATC Dry Eyes:                 - Artificial Tears PRN Xerostomia:                 - BID oral care Urinary Retention:                 - Bladder scan if > 250ml 50me and maintain foley  Constipation:                 - Bisacodyl 10mg P35mN QDay Spiritual:                 -  Chaplain consult  Time Spent: 15 Greater than 50% of the time was spent in counseling and coordination of care ______________________________________________________________________________________ [place] Team Team Cell Phone: [phone] Please utilize secure chat with additional questions, if there is no response within 30 minutes please call the above phone number  Palliative Medicine Team providers are available by phone from 7am to 7pm daily and can be reached through the team cell phone.  Should this patient require assistance outside of these hours, please call the patient's attending physician.

## 2020-07-22 NOTE — Progress Notes (Addendum)
Manufacturing engineer [facility]) Hospital Liaison note.   **12:50pm: Addendum:  Based on vitals signs and information from Tacey Ruiz, NP with PMT, pt appears to be too unstable at this time to transfer. Son [name] made aware in person by [facility] SW and is en route to [facility] to be at bedside.**   [facility] is able to offer a bed today to Mrs. Depierro. Family agreeable to transfer today. TOC aware.    [facility] will notify TOC when registration paperwork has been completed to arrange transport.   RN, please call report to [phone].  Please be sure the DNR form transports with the patient.  Thank you for the opportunity to participate in this patient's care.  Domenic Moras, BSN, RN [facility] Liaison (listed on AMION under Hospice/[facility])    [phone]  [phone] (24h on call)

## 2020-07-22 NOTE — Progress Notes (Signed)
PROGRESS NOTE    Patricia Villegas  ZTI:[id number] DOB: Nov 07, 1934 DOA: 07/16/2020 PCP: Haywood Pao, MD    Brief Narrative:  Patricia Villegas is a 85 year old female with past medical history remarkable for rheumatoid arthritis, peripheral vascular disease, type 2 diabetes mellitus, essential hypertension, hyperlipidemia, CAD s/p CABG who presented to the ED with leg pain. Patient was noted to have severe rhabdomyolysis, anemia and transaminitis. Neurology and GI were consulted. Hospitalist service consulted for admission.  Significant events: 11/2 admitted for rhabdomyolysis started on IV fluids. 11/4-11/8 IV Solu-Medrol 1 g. 11/6 transferred to progressive care unit due to worsening respiratory distress. 11/7 GI signed off.  No intervention planned.  Continue PPI twice daily. 11/8 cortrack inserted 11/9 CXR with RLL infiltrate consistent with aspiration, started on azithromycin/Unasyn 11/11 Palliative care consulted, transitioned to comfort care 11/12 awaiting residential hospice bed 11/13 nursing staff feels patient not stable for transport to residential hospice as likely passing soon in house   Assessment & Plan:   Principal Problem:   Rhabdomyolysis Active Problems:   Essential hypertension   Mixed hyperlipidemia   Peripheral vascular disease ([place])   CAD (coronary artery disease) of artery bypass graft   Rheumatoid arthritis ([place])   Chronic diastolic CHF (congestive heart failure) ([place])   Type 2 diabetes mellitus with diabetic polyneuropathy, with long-term current use of insulin ([facility])   Myositis   Constipation   LFT elevation   Malnutrition of moderate degree   Diffuse pain   Severe muscle deconditioning   Goals of care, counseling/discussion   Terminal care   DNR (do not resuscitate)   Palliative care by specialist  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 Neurology was consulted and followed during the hospital course. CK level peaked at 31,948. ESR 69. Adolase 77 (high), LDH 730. CMV negative. EBV IgG elevated 581 and EBV IgM <36.0. HSV/RSV negative. Ceruloplasm normal.  Alpha-1 antitrypsin normal. ANA negative. Mitochondial ab 24.4; equivocal. AsMA negative.  Acetylcholine receptor antibody negative. Anti-HMGCR Ab negative.  Home medications to include Plaquenil, statin, and Protonix were held for concern of toxic myositis.  Patient received 5-day course of IV Solu-Medrol 1 g followed by steroid taper with improvement of CK 31,948 to 622 and Myoglobin >30K to 2121.  --myositis panel/MyoMarker 3 Profile: pending --Palliative care following, transitioned to comfort measures --Pain control with morphine 2 mg every 1 hours prn --Lorazepam 0.5-1 mg IV every 1 hour as needed anxiety --Glycopyrrolate patch --Haldol as needed for agitation --Awaiting residential hospice placement --Continue supportive care  Elevated LFTs: resolved AST 525 and ALT 252 on admission. Acute hepatitis panel nonreactive.  GGT 17, within normal limits.  Ultrasound with Doppler unrevealing. Etiology likely secondary to perihepatitis from autoimmune/inflammatory myositis.  Transaminitis resolved with IV steroid treatment.  Acute hypoxic respiratory failure Aspiration pneumonia Etiology likely secondary to severe muscle dysfunction from underlying rhabdomyolysis causing severe dysphagia.  Chest x-ray 07/18/2020 with significant infiltrate right lower lobe consistent with aspiration.  WBC count peaked at 37.5.  Patient was started on azithromycin and Unasyn with Mucomyst, scopolamine patch.  Now transitioned to comfort measures, discontinued IV antibiotic treatment.  Dysphagia EGD 07/18/2020 with no findings for esophageal stricture.  Core track initially placed for tube feeds.  Met with palliative care, now transitioned to comfort measures and discontinued artificial  feeding.  Acute on chronic iron deficiency anemia with melena Recent H. pylori infection, s/p treatment Recent EGD 06/17/2020 by [facility] with  2 nonbleeding duodenal ulcers with biopsy positive for H. pylori.  Completed antibiotic treatment.  Ferritin level normal, iron level 11 (low), elevated reticulocyte count, B12 608, folate 18, Coombs test negative. Discontinued home Eliquis.  Received 2 unit PRBC on 07/16/2020.  Evaluated by GI during hospitalization with no further recommendations and signed off.  Now transitionined to comfort measures as above.  Acute on chronic diastolic congestive heart failure Initially treated with IV albumin followed by IV furosemide.  TTE 07/02/2020 with LVEF 20-80%, grade 1 diastolic dysfunction.  Comfort measures as above.  Hypernatremia Etiology likely secondary to poor oral intake/dehydration from dysphagia.  Initially placed on tube feeds with free water supplementation, now transitioned to comfort measures.  Recent LLE DVT Vascular duplex ultrasound 07/03/2020 with findings consistent with acute deep vein thrombosis involving the left posterior tibial veins, left peroneal veins, and left gastrocnemius veins; repeat vascular ultrasound 07/12/2020 with findings of persistent DVT.  Discontinued anticoagulation as transitioned to comfort measures.  Type 2 diabetes mellitus Hemoglobin A1c 6.1, well controlled.  Discontinuing CBGs and insulin has now transitioned to comfort measures.  Hyperlipidemia Discontinued home simvastatin 80 mg PO QHS 2/2 myositis and transaminitis as above  Rheumatoid arthritis On prednisone 5 mg p.o. daily, Plaquenil 200 mg p.o. twice daily, methotrexate 40m weekly (Sunday).  Discontinue home medications as transition to comfort measures.  CAD s/p CABG Essential Hypertension    DVT prophylaxis: Heparin Code Status: DNR; comfort measures Family Communication: No family present at bedside this morning, awaiting residential hospice  placement  Disposition Plan:  Status is: Inpatient  Remains inpatient appropriate because:Persistent severe electrolyte disturbances, Ongoing active pain requiring inpatient pain management, Ongoing diagnostic testing needed not appropriate for outpatient work up, Unsafe d/c plan, IV treatments appropriate due to intensity of illness or inability to take PO and Inpatient level of care appropriate due to severity of illness   Dispo: The patient is from: Home              Anticipated d/c is to: Residential hospice              Anticipated d/c date is: > 3 days              Patient currently is medically stable to d/c.  Consultants:   [facility] - signed off 11/7  Palliative care  Procedures:   Core track placement  Antimicrobials:   Azithromycin 11/9 - 11/11  Unasyn 11/9 - 11/11   Subjective: Patient seen and examined bedside, lying in bed.  Somnolent, will slightly open eyes to command.  Appears comfortable.  No family present at bedside this morning.  Bed available at [facility] today, given her significant decline and unstable for transport; will now transition to [facility] and likely pass in-house.    Objective: Vitals:   07/21/20 0602 07/21/20 1503 07/22/20 0353 07/22/20 1019  BP: (!) 150/59 (!) 164/77 (!) 155/81 137/73  Pulse: 72 87 98 (!) 115  Resp: 18 (!) 25 (!) 22 (!) 24  Temp: 98.5 F (36.9 C) (!) 97.5 F (36.4 C) 100.2 F (37.9 C) 98.2 F (36.8 C)  TempSrc: Axillary Oral Oral   SpO2: 93% 93% 93% (!) 78%  Weight:      Height:        Intake/Output Summary (Last 24 hours) at 07/22/2020 1551 Last data filed at 07/21/2020 2116 Gross per 24 hour  Intake 0 ml  Output 1575 ml  Net -1575 ml   Filed Weights   07/16/20  0800 07/17/20 0327 07/19/20 0334  Weight: 67 kg 69.6 kg 70.9 kg    Examination:  General exam: Appears calm and comfortable; chronically ill/frail in appearance, somnolent Respiratory system: Coarse breath sounds bilaterally, noted  apneic episodes while sleeping, decreased breath sounds bases with crackles; on room air Cardiovascular system: S1 & S2 heard, RRR. No JVD, murmurs, rubs, gallops or clicks. No pedal edema. Gastrointestinal system: Abdomen is nondistended, soft and nontender. No organomegaly or masses felt. Normal bowel sounds heard.   Central nervous system: Somnolent, opens eyes slightly to command Extremities: No peripheral edema Skin: No rashes, lesions or ulcers Psychiatry: Somnolent    Data Reviewed: I have personally reviewed following labs and imaging studies  CBC: Recent Labs  Lab 07/17/20 0612 07/17/20 0900 07/18/20 0420 07/19/20 0222 07/20/20 0712  WBC 21.8* 21.1* 25.8* 19.5* 20.9*  HGB 9.3* 9.5* 10.1* 9.8* 9.5*  HCT 29.1* 29.6* 31.5* 31.2* 30.4*  MCV 97.7 98.3 99.1 100.6* 101.3*  PLT 128* 131* 130* 115* 98*   Basic Metabolic Panel: Recent Labs  Lab 07/16/20 0438 07/16/20 0902 07/16/20 1810 07/16/20 1810 07/17/20 0900 07/17/20 0900 07/17/20 1815 07/17/20 1815 07/18/20 0420 07/18/20 1220 07/18/20 2105 07/19/20 0222 07/20/20 0533  NA   < >  --  147*   < > 152*   < > 149*   < > 153* 154* 154* 152* 154*  K   < >  --  3.4*   < > 2.6*   < > 3.2*   < > 4.0 3.7 3.1* 3.2* 4.2  CL   < >  --  107   < > 110   < > 110   < > 114* 113* 110 110 112*  CO2   < >  --  26   < > 27   < > 26   < > 30 30 32 32 31  GLUCOSE   < >  --  192*   < > 250*   < > 321*   < > 421* 384* 368* 378* 381*  BUN   < >  --  38*   < > 41*   < > 43*   < > 47* 45* 45* 46* 45*  CREATININE   < >  --  0.63   < > 0.64   < > 0.65   < > 0.63 0.61 0.55 0.46 0.54  CALCIUM   < >  --  8.3*   < > 8.5*   < > 8.2*   < > 8.2* 8.1* 8.0* 8.0* 7.9*  MG  --  1.9 2.4  --   --   --  2.1  --   --   --   --   --  2.2  PHOS  --   --   --   --  3.9  --   --   --   --   --   --  1.9* 1.4*   < > = values in this interval not displayed.   GFR: Estimated Creatinine Clearance: 49.9 mL/min (by C-G formula based on SCr of 0.54 mg/dL). Liver  Function Tests: Recent Labs  Lab 07/16/20 0438 07/17/20 0900 07/18/20 0420 07/19/20 0222 07/20/20 0533  AST 193* 94* 58* 39 36  ALT 422* 294* 264* 221* 175*  ALKPHOS 54 48 53 54 55  BILITOT 1.2 1.9* 0.8 0.9 0.7  PROT 4.7* 4.7* 4.7* 4.5* 4.2*  ALBUMIN 2.6* 2.8* 2.6* 2.3* 2.1*   No results for  input(s): LIPASE, AMYLASE in the last 168 hours. No results for input(s): AMMONIA in the last 168 hours. Coagulation Profile: No results for input(s): INR, PROTIME in the last 168 hours. Cardiac Enzymes: Recent Labs  Lab 07/16/20 0438 07/17/20 0900 07/18/20 0420 07/19/20 0222 07/20/20 0533  CKTOTAL 3,921* 2,316* 1,006* 622* 424*   BNP (last 3 results) Recent Labs    08/31/19 1052  PROBNP 980*   HbA1C: No results for input(s): HGBA1C in the last 72 hours. CBG: Recent Labs  Lab 07/19/20 2324 07/19/20 2346 07/20/20 0334 07/20/20 0811 07/20/20 1112  GLUCAP 344* 374* 297* 255* 194*   Lipid Profile: No results for input(s): CHOL, HDL, LDLCALC, TRIG, CHOLHDL, LDLDIRECT in the last 72 hours. Thyroid Function Tests: No results for input(s): TSH, T4TOTAL, FREET4, T3FREE, THYROIDAB in the last 72 hours. Anemia Panel: No results for input(s): VITAMINB12, FOLATE, FERRITIN, TIBC, IRON, RETICCTPCT in the last 72 hours. Sepsis Labs: No results for input(s): PROCALCITON, LATICACIDVEN in the last 168 hours.  Recent Results (from the past 240 hour(s))  Culture, Urine     Status: Abnormal   Collection Time: 07/16/20  4:29 PM   Specimen: Urine, Random  Result Value Ref Range Status   Specimen Description URINE, RANDOM  Final   Special Requests   Final    NONE Performed at [facility], [address], [place], [place] [address]    Culture MULTIPLE SPECIES PRESENT, SUGGEST RECOLLECTION (A)  Final   Report Status 07/17/2020 FINAL  Final         Radiology Studies: No results found.      Scheduled Meds: . chlorhexidine  15 mL Mouth Rinse BID  . furosemide  40 mg  Intravenous Daily  . glycopyrrolate  0.4 mg Intravenous Q4H  . liver oil-zinc oxide  1 application Topical TID  . magic mouthwash  5 mL Oral QID  . mouth rinse  15 mL Mouth Rinse q12n4p  . scopolamine  1 patch Transdermal Q72H   Continuous Infusions:    LOS: 11 days    Time spent: 26 minutes spent on chart review, discussion with nursing staff, consultants, updating family and interview/physical exam; more than 50% of that time was spent in counseling and/or coordination of care.    Byrne Capek J [place] DO [facility] Available via Epic secure chat 7am-7pm After these hours, please refer to coverage provider listed on [email or web address] 07/22/2020, 3:51 PM

## 2020-07-23 DIAGNOSIS — Z7189 Other specified counseling: Secondary | ICD-10-CM | POA: Diagnosis not present

## 2020-07-23 DIAGNOSIS — Z515 Encounter for palliative care: Secondary | ICD-10-CM | POA: Diagnosis not present

## 2020-07-23 DIAGNOSIS — Z66 Do not resuscitate: Secondary | ICD-10-CM | POA: Diagnosis not present

## 2020-07-23 DIAGNOSIS — E139 Other specified diabetes mellitus without complications: Secondary | ICD-10-CM | POA: Diagnosis not present

## 2020-07-23 DIAGNOSIS — K5901 Slow transit constipation: Secondary | ICD-10-CM | POA: Diagnosis not present

## 2020-07-23 DIAGNOSIS — I25810 Atherosclerosis of coronary artery bypass graft(s) without angina pectoris: Secondary | ICD-10-CM | POA: Diagnosis not present

## 2020-07-23 DIAGNOSIS — I5032 Chronic diastolic (congestive) heart failure: Secondary | ICD-10-CM | POA: Diagnosis not present

## 2020-07-23 NOTE — Progress Notes (Signed)
Palliative Medicine Inpatient Follow Up Note  Reason for consult:   "31F admit with Rhabdo/polmyositis, ASp PNA, dysphagia now on cortrack. Please assist with GOC/MDM."  HPI:  Per intake H&P --> Patricia Villegas a 85 year old female with past medical history remarkable for rheumatoid arthritis, peripheral vascular disease, type 2 diabetes mellitus, essential hypertension, hyperlipidemia, CAD s/p CABG who presented to the ED with leg pain. Patient was noted to have severe rhabdomyolysis, anemia and transaminitis. Neurology and GI were consulted.  Palliative care was asked to get involved to further discuss goals of care in the setting of multiple chronic diseases and now recurrent aspiration pneumonia.   Today's Discussion (07/23/2020): Chart reviewed.  I met at bedside with Lauriel and her daughter Patricia Villegas at this morning.  Verlon appears quite somnolent and does not arouse any longer.  She has notable mottling and coolness to the touch of her lower extremities.  Her digits and her upper extremities are also noted to be cool.  Her respirations have decreased.  I reviewed with Patricia Villegas all of the physical indicators that she is transitioning into the last phases of life.  Patricia Villegas was reasonably tearful during our time together.  She expressed what a strong and loving woman Cressida was.  She shared that Patricia Villegas always put others above herself and exemplified great compassion while doing so.  We discussed the severity of Patricia Villegas's illnesses and the reasons why comfort focused care had been chosen by her spouse [place].  I shared with Patricia Villegas that Villa herself on Thursday was able to participate in making this very difficult decision.  Patricia Villegas goes on to tell me that Patricia Villegas has suffered for a long time and she is glad that her mother will now be "at peace".  I shared my contact information to offer support during this very difficult time should have been needed.  Objective Assessment: Vital Signs Vitals:    07/22/20 1019 07/23/20 0506  BP: 137/73 129/60  Pulse: (!) 115 (!) 107  Resp: (!) 24 16  Temp: 98.2 F (36.8 C) (!) 101 F (38.3 C)  SpO2: (!) 78% (!) 89%    Intake/Output Summary (Last 24 hours) at 07/23/2020 1200 Last data filed at 07/23/2020 0507 Gross per 24 hour  Intake --  Output 1450 ml  Net -1450 ml   Last Weight  Most recent update: 07/19/2020  3:35 AM   Weight  70.9 kg (156 lb 4.9 oz)           Gen:  Frail elderly female HEENT: Dry mucous membranes CV: Regular rate and rhythm, no murmurs rubs or gallops PULM: Dimished ABD: soft/nontender/nondistended/normal bowel sounds  EXT: (+) Generalized edema Neuro: Somnoleny  SUMMARY OF RECOMMENDATIONS DNAR/DNI  Comfort focused Care -appears to be encroaching upon last phases of life  Spiritual - Baptist  Liberalize visitation restrictions given comfort focus  TOC - Inpatient Hospice  Prognosis anticipate days  Code Status/Advance Care Planning: DNAR/DNI  Symptom Management: Dyspnea: Pain:                 - Morphine 57m IV Q1H PRN Fever:                 - Tylenol 6544mPO/PR Q6H PRN Agitation: Anxiety:  -Haldol 17m9mV Q6H                 - Lorazepam 0.5-1mg35m Q1H PRN Nausea:                 -  Zofran 55m PO/IV Q6H PRN             Secretions:                 - Atropine Patch  - Glyco Q4HATC Dry Eyes:                 - Artificial Tears PRN Xerostomia:                 - BID oral care Urinary Retention:                 - Bladder scan if > 2574mplace and maintain foley  Constipation:                 - Bisacodyl 1079mR PRN QDay Spiritual:                 - Chaplain consult  Time Spent: 40 Greater than 50% of the time was spent in counseling and coordination of care ______________________________________________________________________________________ [facility] Team Cell Phone: [phone] utilize secure chat with additional  questions, if there is no response within 30 minutes please call the above phone number  Palliative Medicine Team providers are available by phone from 7am to 7pm daily and can be reached through the team cell phone.  Should this patient require assistance outside of these hours, please call the patient's attending physician.

## 2020-07-23 NOTE — Progress Notes (Signed)
PROGRESS NOTE    Patricia Villegas  MRN:[id number] DOB: 10/21/1934 DOA: 08/04/2020 PCP: Tisovec, Richard W, MD    Brief Narrative:  Patricia Villegas is a 85-year-old female with past medical history remarkable for rheumatoid arthritis, peripheral vascular disease, type 2 diabetes mellitus, essential hypertension, hyperlipidemia, CAD s/p CABG who presented to the ED with leg pain. Patient was noted to have severe rhabdomyolysis, anemia and transaminitis. Neurology and GI were consulted. Hospitalist service consulted for admission.  Significant events: 11/2 admitted for rhabdomyolysis started on IV fluids. 11/4-11/8 IV Solu-Medrol 1 g. 11/6 transferred to progressive care unit due to worsening respiratory distress. 11/7 GI signed off.  No intervention planned.  Continue PPI twice daily. 11/8 cortrack inserted 11/9 CXR with RLL infiltrate consistent with aspiration, started on azithromycin/Unasyn 11/11 Palliative care consulted, transitioned to comfort care 11/12 awaiting residential hospice bed 11/13 nursing staff feels patient not stable for transport to residential hospice as likely passing soon in house   Assessment & Plan:   Principal Problem:   Rhabdomyolysis Active Problems:   Essential hypertension   Mixed hyperlipidemia   Peripheral vascular disease (HCC)   CAD (coronary artery disease) of artery bypass graft   Rheumatoid arthritis (HCC)   Chronic diastolic CHF (congestive heart failure) (HCC)   Type 2 diabetes mellitus with diabetic polyneuropathy, with long-term current use of insulin (HCC)   Myositis   Constipation   LFT elevation   Malnutrition of moderate degree   Diffuse pain   Severe muscle deconditioning   Goals of care, counseling/discussion   Terminal care   DNR (do not resuscitate)   Palliative care by specialist  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 Neurology was consulted and followed during the hospital course. CK level peaked at 31,948. ESR 69. Adolase 77 (high), LDH 730. CMV negative. EBV IgG elevated 581 and EBV IgM <36.0. HSV/RSV negative. Ceruloplasm normal.  Alpha-1 antitrypsin normal. ANA negative. Mitochondial ab 24.4; equivocal. AsMA negative.  Acetylcholine receptor antibody negative. Anti-HMGCR Ab negative.  Home medications to include Plaquenil, statin, and Protonix were held for concern of toxic myositis.  Patient received 5-day course of IV Solu-Medrol 1 g followed by steroid taper with improvement of CK 31,948 to 622 and Myoglobin >30K to 2121.  --myositis panel/MyoMarker 3 Profile: pending --Palliative care following, transitioned to comfort measures --Pain control with morphine 2 mg every 1 hours prn --Lorazepam 0.5-1 mg IV every 1 hour as needed anxiety --Glycopyrrolate patch --Haldol as needed for agitation --Continue supportive care  Elevated LFTs: resolved AST 525 and ALT 252 on admission. Acute hepatitis panel nonreactive.  GGT 17, within normal limits.  Ultrasound with Doppler unrevealing. Etiology likely secondary to perihepatitis from autoimmune/inflammatory myositis.  Transaminitis resolved with IV steroid treatment.  Acute hypoxic respiratory failure Aspiration pneumonia Etiology likely secondary to severe muscle dysfunction from underlying rhabdomyolysis causing severe dysphagia.  Chest x-ray 07/18/2020 with significant infiltrate right lower lobe consistent with aspiration.  WBC count peaked at 37.5.  Patient was started on azithromycin and Unasyn with Mucomyst, scopolamine patch.  Now transitioned to comfort measures, discontinued IV antibiotic treatment.  Dysphagia EGD 07/18/2020 with no findings for esophageal stricture.  Core track initially placed for tube feeds.  Met with palliative care, now transitioned to comfort measures and discontinued artificial feeding.  Acute on chronic iron deficiency  anemia with melena Recent H. pylori infection, s/p treatment Recent EGD 06/17/2020 by [facility] with 2 nonbleeding duodenal ulcers   with biopsy positive for H. pylori.  Completed antibiotic treatment.  Ferritin level normal, iron level 11 (low), elevated reticulocyte count, B12 608, folate 18, Coombs test negative. Discontinued home Eliquis.  Received 2 unit PRBC on 07/16/2020.  Evaluated by GI during hospitalization with no further recommendations and signed off.  Now transitionined to comfort measures as above.  Acute on chronic diastolic congestive heart failure Initially treated with IV albumin followed by IV furosemide.  TTE 07/02/2020 with LVEF 62-94%, grade 1 diastolic dysfunction.  Comfort measures as above.  Hypernatremia Etiology likely secondary to poor oral intake/dehydration from dysphagia.  Initially placed on tube feeds with free water supplementation, now transitioned to comfort measures.  Recent LLE DVT Vascular duplex ultrasound 07/03/2020 with findings consistent with acute deep vein thrombosis involving the left posterior tibial veins, left peroneal veins, and left gastrocnemius veins; repeat vascular ultrasound 07/12/2020 with findings of persistent DVT.  Discontinued anticoagulation as transitioned to comfort measures.  Type 2 diabetes mellitus Hemoglobin A1c 6.1, well controlled.  Discontinuing CBGs and insulin has now transitioned to comfort measures.  Hyperlipidemia Discontinued home simvastatin 80 mg PO QHS 2/2 myositis and transaminitis as above  Rheumatoid arthritis On prednisone 5 mg p.o. daily, Plaquenil 200 mg p.o. twice daily, methotrexate 80m weekly (Sunday).  Discontinue home medications as transition to comfort measures.  CAD s/p CABG Essential Hypertension    DVT prophylaxis: Heparin Code Status: DNR; comfort measures Family Communication: Updated patient's daughter, SFreda Munrovia bedside this morning.  Disposition Plan:  Status is: Inpatient  Remains  inpatient appropriate because:Persistent severe electrolyte disturbances, Ongoing active pain requiring inpatient pain management, Ongoing diagnostic testing needed not appropriate for outpatient work up, Unsafe d/c plan, IV treatments appropriate due to intensity of illness or inability to take PO and Inpatient level of care appropriate due to severity of illness   Dispo: The patient is from: Home              Anticipated d/c is to: Anticipate in-hospital death              Anticipated d/c date is: > 3 days              Patient currently is medically stable to d/c.  Consultants:   [facility] - signed off 11/7  Palliative care  Procedures:   Core track placement  Antimicrobials:   Azithromycin 11/9 - 11/11  Unasyn 11/9 - 11/11   Subjective: Patient seen and examined bedside, lying in bed.  Somnolent, nonresponsive to command.  Daughter present at bedside.  Appears comfortable.  Thought to be unable to transport to [facility] yesterday, will remain inpatient until passing.  Objective: Vitals:   07/21/20 1503 07/22/20 0353 07/22/20 1019 07/23/20 0506  BP: (!) 164/77 (!) 155/81 137/73 129/60  Pulse: 87 98 (!) 115 (!) 107  Resp: (!) 25 (!) 22 (!) 24 16  Temp: (!) 97.5 F (36.4 C) 100.2 F (37.9 C) 98.2 F (36.8 C) (!) 101 F (38.3 C)  TempSrc: Oral Oral  Oral  SpO2: 93% 93% (!) 78% (!) 89%  Weight:      Height:        Intake/Output Summary (Last 24 hours) at 07/23/2020 1434 Last data filed at 07/23/2020 0507 Gross per 24 hour  Intake --  Output 1450 ml  Net -1450 ml   Filed Weights   07/16/20 0800 07/17/20 0327 07/19/20 0334  Weight: 67 kg 69.6 kg 70.9 kg    Examination:  General exam: Appears calm  and comfortable; chronically ill/frail in appearance, somnolent Respiratory system: Coarse breath sounds bilaterally, noted apneic episodes while sleeping, decreased breath sounds bases with crackles Cardiovascular system: S1 & S2 heard, RRR. No JVD, murmurs,  rubs, gallops or clicks. No pedal edema. Gastrointestinal system: Abdomen is nondistended, soft and nontender. No organomegaly or masses felt. Normal bowel sounds heard.   Central nervous system: Somnolent, unresponsive to command Extremities: No peripheral edema Skin: No rashes, lesions or ulcers Psychiatry: Somnolent    Data Reviewed: I have personally reviewed following labs and imaging studies  CBC: Recent Labs  Lab 07/17/20 0612 07/17/20 0900 07/18/20 0420 07/19/20 0222 07/20/20 0712  WBC 21.8* 21.1* 25.8* 19.5* 20.9*  HGB 9.3* 9.5* 10.1* 9.8* 9.5*  HCT 29.1* 29.6* 31.5* 31.2* 30.4*  MCV 97.7 98.3 99.1 100.6* 101.3*  PLT 128* 131* 130* 115* 98*   Basic Metabolic Panel: Recent Labs  Lab 07/16/20 1810 07/16/20 1810 07/17/20 0900 07/17/20 0900 07/17/20 1815 07/17/20 1815 07/18/20 0420 07/18/20 1220 07/18/20 2105 07/19/20 0222 07/20/20 0533  NA 147*   < > 152*   < > 149*   < > 153* 154* 154* 152* 154*  K 3.4*   < > 2.6*   < > 3.2*   < > 4.0 3.7 3.1* 3.2* 4.2  CL 107   < > 110   < > 110   < > 114* 113* 110 110 112*  CO2 26   < > 27   < > 26   < > 30 30 32 32 31  GLUCOSE 192*   < > 250*   < > 321*   < > 421* 384* 368* 378* 381*  BUN 38*   < > 41*   < > 43*   < > 47* 45* 45* 46* 45*  CREATININE 0.63   < > 0.64   < > 0.65   < > 0.63 0.61 0.55 0.46 0.54  CALCIUM 8.3*   < > 8.5*   < > 8.2*   < > 8.2* 8.1* 8.0* 8.0* 7.9*  MG 2.4  --   --   --  2.1  --   --   --   --   --  2.2  PHOS  --   --  3.9  --   --   --   --   --   --  1.9* 1.4*   < > = values in this interval not displayed.   GFR: Estimated Creatinine Clearance: 49.9 mL/min (by C-G formula based on SCr of 0.54 mg/dL). Liver Function Tests: Recent Labs  Lab 07/17/20 0900 07/18/20 0420 07/19/20 0222 07/20/20 0533  AST 94* 58* 39 36  ALT 294* 264* 221* 175*  ALKPHOS 48 53 54 55  BILITOT 1.9* 0.8 0.9 0.7  PROT 4.7* 4.7* 4.5* 4.2*  ALBUMIN 2.8* 2.6* 2.3* 2.1*   No results for input(s): LIPASE, AMYLASE in  the last 168 hours. No results for input(s): AMMONIA in the last 168 hours. Coagulation Profile: No results for input(s): INR, PROTIME in the last 168 hours. Cardiac Enzymes: Recent Labs  Lab 07/17/20 0900 07/18/20 0420 07/19/20 0222 07/20/20 0533  CKTOTAL 2,316* 1,006* 622* 424*   BNP (last 3 results) Recent Labs    08/31/19 1052  PROBNP 980*   HbA1C: No results for input(s): HGBA1C in the last 72 hours. CBG: Recent Labs  Lab 07/19/20 2324 07/19/20 2346 07/20/20 0334 07/20/20 0811 07/20/20 1112  GLUCAP 344* 374* 297* 255* 194*   Lipid  Profile: No results for input(s): CHOL, HDL, LDLCALC, TRIG, CHOLHDL, LDLDIRECT in the last 72 hours. Thyroid Function Tests: No results for input(s): TSH, T4TOTAL, FREET4, T3FREE, THYROIDAB in the last 72 hours. Anemia Panel: No results for input(s): VITAMINB12, FOLATE, FERRITIN, TIBC, IRON, RETICCTPCT in the last 72 hours. Sepsis Labs: No results for input(s): PROCALCITON, LATICACIDVEN in the last 168 hours.  Recent Results (from the past 240 hour(s))  Culture, Urine     Status: Abnormal   Collection Time: 07/16/20  4:29 PM   Specimen: Urine, Random  Result Value Ref Range Status   Specimen Description URINE, RANDOM  Final   Special Requests   Final    NONE Performed at [facility], [address], [place], [place] [address]    Culture MULTIPLE SPECIES PRESENT, SUGGEST RECOLLECTION (A)  Final   Report Status 07/17/2020 FINAL  Final         Radiology Studies: No results found.      Scheduled Meds: . chlorhexidine  15 mL Mouth Rinse BID  . furosemide  40 mg Intravenous Daily  . glycopyrrolate  0.4 mg Intravenous Q4H  . liver oil-zinc oxide  1 application Topical TID  . magic mouthwash  5 mL Oral QID  . mouth rinse  15 mL Mouth Rinse q12n4p  . scopolamine  1 patch Transdermal Q72H   Continuous Infusions:    LOS: 12 days    Time spent: 26 minutes spent on chart review, discussion with nursing staff,  consultants, updating family and interview/physical exam; more than 50% of that time was spent in counseling and/or coordination of care.    Brigitte Soderberg J [place] DO [facility] Available via Epic secure chat 7am-7pm After these hours, please refer to coverage provider listed on [email or web address] 07/23/2020, 2:34 PM

## 2020-07-24 DIAGNOSIS — I25810 Atherosclerosis of coronary artery bypass graft(s) without angina pectoris: Secondary | ICD-10-CM | POA: Diagnosis not present

## 2020-07-24 DIAGNOSIS — K5901 Slow transit constipation: Secondary | ICD-10-CM | POA: Diagnosis not present

## 2020-07-24 DIAGNOSIS — I5032 Chronic diastolic (congestive) heart failure: Secondary | ICD-10-CM | POA: Diagnosis not present

## 2020-07-24 DIAGNOSIS — E139 Other specified diabetes mellitus without complications: Secondary | ICD-10-CM | POA: Diagnosis not present

## 2020-07-24 NOTE — Progress Notes (Signed)
Palliative Medicine RN Note: Symptom check. Unresponsive. Breathing shallow. Spoke with RN, who reports that family got to visit earlier today. Will f/u tomorrow.  Marjie Skiff Kaitlyn Franko, RN, BSN, [facility] Palliative Medicine Team 07/24/2020 4:17 PM Office [phone]

## 2020-07-24 NOTE — Progress Notes (Signed)
Pt appears comfortable, no grimace or facial expression showing pain.

## 2020-07-24 NOTE — Progress Notes (Signed)
PROGRESS NOTE    Zaynah S Cottier  MRN:[id number] DOB: 03/26/1935 DOA: 07/15/2020 PCP: Tisovec, Richard W, MD    Brief Narrative:  Patricia Villegas is a 85-year-old female with past medical history remarkable for rheumatoid arthritis, peripheral vascular disease, type 2 diabetes mellitus, essential hypertension, hyperlipidemia, CAD s/p CABG who presented to the ED with leg pain. Patient was noted to have severe rhabdomyolysis, anemia and transaminitis. Neurology and GI were consulted. Hospitalist service consulted for admission.  Significant events: 11/2 admitted for rhabdomyolysis started on IV fluids. 11/4-11/8 IV Solu-Medrol 1 g. 11/6 transferred to progressive care unit due to worsening respiratory distress. 11/7 GI signed off.  No intervention planned.  Continue PPI twice daily. 11/8 cortrack inserted 11/9 CXR with RLL infiltrate consistent with aspiration, started on azithromycin/Unasyn 11/11 Palliative care consulted, transitioned to comfort care 11/12 awaiting residential hospice bed 11/13 nursing staff feels patient not stable for transport to residential hospice as likely passing soon in house   Assessment & Plan:   Principal Problem:   Rhabdomyolysis Active Problems:   Essential hypertension   Mixed hyperlipidemia   Peripheral vascular disease (HCC)   CAD (coronary artery disease) of artery bypass graft   Rheumatoid arthritis (HCC)   Chronic diastolic CHF (congestive heart failure) (HCC)   Type 2 diabetes mellitus with diabetic polyneuropathy, with long-term current use of insulin (HCC)   Myositis   Constipation   LFT elevation   Malnutrition of moderate degree   Diffuse pain   Severe muscle deconditioning   Goals of care, counseling/discussion   Terminal care   DNR (do not resuscitate)   Palliative care by specialist  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 Neurology was consulted and followed during the hospital course. CK level peaked at 31,948. ESR 69. Adolase 77 (high), LDH 730. CMV negative. EBV IgG elevated 581 and EBV IgM <36.0. HSV/RSV negative. Ceruloplasm normal.  Alpha-1 antitrypsin normal. ANA negative. Mitochondial ab 24.4; equivocal. AsMA negative.  Acetylcholine receptor antibody negative. Anti-HMGCR Ab negative.  Home medications to include Plaquenil, statin, and Protonix were held for concern of toxic myositis.  Patient received 5-day course of IV Solu-Medrol 1 g followed by steroid taper with improvement of CK 31,948 to 622 and Myoglobin >30K to 2121.  --myositis panel/MyoMarker 3 Profile: pending --Palliative care following, transitioned to comfort measures --Pain control with morphine 2 mg every 1 hours prn --Lorazepam 0.5-1 mg IV every 1 hour as needed anxiety --Glycopyrrolate patch --Haldol as needed for agitation --Continue supportive care  Elevated LFTs: resolved AST 525 and ALT 252 on admission. Acute hepatitis panel nonreactive.  GGT 17, within normal limits.  Ultrasound with Doppler unrevealing. Etiology likely secondary to perihepatitis from autoimmune/inflammatory myositis.  Transaminitis resolved with IV steroid treatment.  Acute hypoxic respiratory failure Aspiration pneumonia Etiology likely secondary to severe muscle dysfunction from underlying rhabdomyolysis causing severe dysphagia.  Chest x-ray 07/18/2020 with significant infiltrate right lower lobe consistent with aspiration.  WBC count peaked at 37.5.  Patient was started on azithromycin and Unasyn with Mucomyst, scopolamine patch.  Now transitioned to comfort measures, discontinued IV antibiotic treatment.  Dysphagia EGD 07/18/2020 with no findings for esophageal stricture.  Core track initially placed for tube feeds.  Met with palliative care, now transitioned to comfort measures and discontinued artificial feeding.  Acute on chronic iron deficiency  anemia with melena Recent H. pylori infection, s/p treatment Recent EGD 06/17/2020 by [facility] with 2 nonbleeding duodenal ulcers   with biopsy positive for H. pylori.  Completed antibiotic treatment.  Ferritin level normal, iron level 11 (low), elevated reticulocyte count, B12 608, folate 18, Coombs test negative. Discontinued home Eliquis.  Received 2 unit PRBC on 07/16/2020.  Evaluated by GI during hospitalization with no further recommendations and signed off.  Now transitionined to comfort measures as above.  Acute on chronic diastolic congestive heart failure Initially treated with IV albumin followed by IV furosemide.  TTE 07/02/2020 with LVEF 56-81%, grade 1 diastolic dysfunction.  Comfort measures as above.  Hypernatremia Etiology likely secondary to poor oral intake/dehydration from dysphagia.  Initially placed on tube feeds with free water supplementation, now transitioned to comfort measures.  Recent LLE DVT Vascular duplex ultrasound 07/03/2020 with findings consistent with acute deep vein thrombosis involving the left posterior tibial veins, left peroneal veins, and left gastrocnemius veins; repeat vascular ultrasound 07/12/2020 with findings of persistent DVT.  Discontinued anticoagulation as transitioned to comfort measures.  Type 2 diabetes mellitus Hemoglobin A1c 6.1, well controlled.  Discontinuing CBGs and insulin has now transitioned to comfort measures.  Hyperlipidemia Discontinued home simvastatin 80 mg PO QHS 2/2 myositis and transaminitis as above  Rheumatoid arthritis On prednisone 5 mg p.o. daily, Plaquenil 200 mg p.o. twice daily, methotrexate 25mg  weekly (Sunday).  Discontinue home medications as transition to comfort measures.  CAD s/p CABG Essential Hypertension    DVT prophylaxis: Heparin Code Status: DNR; comfort measures Family Communication: Updated patient's daughter, [name] via bedside this morning.  Disposition Plan:  Status is: Inpatient  Remains  inpatient appropriate because:Persistent severe electrolyte disturbances, Ongoing active pain requiring inpatient pain management, Ongoing diagnostic testing needed not appropriate for outpatient work up, Unsafe d/c plan, IV treatments appropriate due to intensity of illness or inability to take PO and Inpatient level of care appropriate due to severity of illness   Dispo: The patient is from: Home              Anticipated d/c is to: Anticipate in-hospital death              Anticipated d/c date is: > 3 days              Patient currently is medically stable to d/c.  Consultants:   [facility] - signed off 11/7  Palliative care  Procedures:   Core track placement  Antimicrobials:   Azithromycin 11/9 - 11/11  Unasyn 11/9 - 11/11   Subjective: Patient seen and examined bedside, lying in bed.  Somnolent, nonresponsive to command.  Daughter present at bedside.  Appears comfortable.  Thought to be unable to transport to [facility] yesterday, will remain inpatient until passing.  Objective: Vitals:   07/22/20 0353 07/22/20 1019 07/23/20 0506 07/24/20 0533  BP: (!) 155/81 137/73 129/60 (!) 164/87  Pulse: 98 (!) 115 (!) 107 (!) 107  Resp: (!) 22 (!) 24 16 18   Temp: 100.2 F (37.9 C) 98.2 F (36.8 C) (!) 101 F (38.3 C) 98.3 F (36.8 C)  TempSrc: Oral  Oral Oral  SpO2: 93% (!) 78% (!) 89% (!) 87%  Weight:      Height:        Intake/Output Summary (Last 24 hours) at 07/24/2020 1304 Last data filed at 07/24/2020 1029 Gross per 24 hour  Intake 0 ml  Output 775 ml  Net -775 ml   Filed Weights   07/16/20 0800 07/17/20 0327 07/19/20 0334  Weight: 67 kg 69.6 kg 70.9 kg    Examination:  General exam: Appears  calm and comfortable; chronically ill/frail in appearance, somnolent Respiratory system: Coarse breath sounds bilaterally, decreased breath sounds bases with crackles Cardiovascular system: S1 & S2 heard, RRR. No JVD, murmurs, rubs, gallops or clicks. No pedal  edema. Gastrointestinal system: Abdomen is nondistended, soft and nontender. No organomegaly or masses felt. Normal bowel sounds heard.   Central nervous system: Somnolent, unresponsive to command Extremities: No peripheral edema Skin: No rashes, lesions or ulcers Psychiatry: Somnolent    Data Reviewed: I have personally reviewed following labs and imaging studies  CBC: Recent Labs  Lab 07/18/20 0420 07/19/20 0222 07/20/20 0712  WBC 25.8* 19.5* 20.9*  HGB 10.1* 9.8* 9.5*  HCT 31.5* 31.2* 30.4*  MCV 99.1 100.6* 101.3*  PLT 130* 115* 98*   Basic Metabolic Panel: Recent Labs  Lab 07/17/20 1815 07/17/20 1815 07/18/20 0420 07/18/20 1220 07/18/20 2105 07/19/20 0222 07/20/20 0533  NA 149*   < > 153* 154* 154* 152* 154*  K 3.2*   < > 4.0 3.7 3.1* 3.2* 4.2  CL 110   < > 114* 113* 110 110 112*  CO2 26   < > 30 30 32 32 31  GLUCOSE 321*   < > 421* 384* 368* 378* 381*  BUN 43*   < > 47* 45* 45* 46* 45*  CREATININE 0.65   < > 0.63 0.61 0.55 0.46 0.54  CALCIUM 8.2*   < > 8.2* 8.1* 8.0* 8.0* 7.9*  MG 2.1  --   --   --   --   --  2.2  PHOS  --   --   --   --   --  1.9* 1.4*   < > = values in this interval not displayed.   GFR: Estimated Creatinine Clearance: 49.9 mL/min (by C-G formula based on SCr of 0.54 mg/dL). Liver Function Tests: Recent Labs  Lab 07/18/20 0420 07/19/20 0222 07/20/20 0533  AST 58* 39 36  ALT 264* 221* 175*  ALKPHOS 53 54 55  BILITOT 0.8 0.9 0.7  PROT 4.7* 4.5* 4.2*  ALBUMIN 2.6* 2.3* 2.1*   No results for input(s): LIPASE, AMYLASE in the last 168 hours. No results for input(s): AMMONIA in the last 168 hours. Coagulation Profile: No results for input(s): INR, PROTIME in the last 168 hours. Cardiac Enzymes: Recent Labs  Lab 07/18/20 0420 07/19/20 0222 07/20/20 0533  CKTOTAL 1,006* 622* 424*   BNP (last 3 results) Recent Labs    08/31/19 1052  PROBNP 980*   HbA1C: No results for input(s): HGBA1C in the last 72 hours. CBG: Recent Labs   Lab 07/19/20 2324 07/19/20 2346 07/20/20 0334 07/20/20 0811 07/20/20 1112  GLUCAP 344* 374* 297* 255* 194*   Lipid Profile: No results for input(s): CHOL, HDL, LDLCALC, TRIG, CHOLHDL, LDLDIRECT in the last 72 hours. Thyroid Function Tests: No results for input(s): TSH, T4TOTAL, FREET4, T3FREE, THYROIDAB in the last 72 hours. Anemia Panel: No results for input(s): VITAMINB12, FOLATE, FERRITIN, TIBC, IRON, RETICCTPCT in the last 72 hours. Sepsis Labs: No results for input(s): PROCALCITON, LATICACIDVEN in the last 168 hours.  Recent Results (from the past 240 hour(s))  Culture, Urine     Status: Abnormal   Collection Time: 07/16/20  4:29 PM   Specimen: Urine, Random  Result Value Ref Range Status   Specimen Description URINE, RANDOM  Final   Special Requests   Final    NONE Performed at [facility], [address] [place], [place] [address]    Culture MULTIPLE SPECIES  PRESENT, SUGGEST RECOLLECTION (A)  Final   Report Status 07/17/2020 FINAL  Final         Radiology Studies: No results found.      Scheduled Meds: . chlorhexidine  15 mL Mouth Rinse BID  . furosemide  40 mg Intravenous Daily  . glycopyrrolate  0.4 mg Intravenous Q4H  . liver oil-zinc oxide  1 application Topical TID  . magic mouthwash  5 mL Oral QID  . mouth rinse  15 mL Mouth Rinse q12n4p  . scopolamine  1 patch Transdermal Q72H   Continuous Infusions:    LOS: 13 days    Time spent: 26 minutes spent on chart review, discussion with nursing staff, consultants, updating family and interview/physical exam; more than 50% of that time was spent in counseling and/or coordination of care.    Leaman Abe J [place] DO [facility] Available via Epic secure chat 7am-7pm After these hours, please refer to coverage provider listed on [email or web address] 07/24/2020, 1:04 PM

## 2020-07-24 NOTE — Progress Notes (Signed)
This chaplain is pastorally present for spiritual care. Family members are not bedside. Prayer was shared with the Pt. F/U spiritual care is available as needed.

## 2020-07-25 DIAGNOSIS — Z515 Encounter for palliative care: Secondary | ICD-10-CM | POA: Diagnosis not present

## 2020-07-25 DIAGNOSIS — R0609 Other forms of dyspnea: Secondary | ICD-10-CM | POA: Diagnosis not present

## 2020-07-25 DIAGNOSIS — I5032 Chronic diastolic (congestive) heart failure: Secondary | ICD-10-CM | POA: Diagnosis not present

## 2020-07-25 DIAGNOSIS — I25810 Atherosclerosis of coronary artery bypass graft(s) without angina pectoris: Secondary | ICD-10-CM | POA: Diagnosis not present

## 2020-07-25 DIAGNOSIS — E139 Other specified diabetes mellitus without complications: Secondary | ICD-10-CM | POA: Diagnosis not present

## 2020-07-25 DIAGNOSIS — K5901 Slow transit constipation: Secondary | ICD-10-CM | POA: Diagnosis not present

## 2020-07-25 NOTE — Progress Notes (Signed)
Pt's daughter came in to be with mom. Pt appears very peaceful, repositioned.

## 2020-07-25 NOTE — Progress Notes (Signed)
PROGRESS NOTE    Patricia Villegas  MRN:[id number] DOB: 04/13/1935 DOA: 07/24/2020 PCP: Tisovec, Richard W, MD    Brief Narrative:  Patricia Villegas is a 85-year-old female with past medical history remarkable for rheumatoid arthritis, peripheral vascular disease, type 2 diabetes mellitus, essential hypertension, hyperlipidemia, CAD s/p CABG who presented to the ED with leg pain. Patient was noted to have severe rhabdomyolysis, anemia and transaminitis. Neurology and GI were consulted. Hospitalist service consulted for admission.  Significant events: 11/2 admitted for rhabdomyolysis started on IV fluids. 11/4-11/8 IV Solu-Medrol 1 g. 11/6 transferred to progressive care unit due to worsening respiratory distress. 11/7 GI signed off.  No intervention planned.  Continue PPI twice daily. 11/8 cortrack inserted 11/9 CXR with RLL infiltrate consistent with aspiration, started on azithromycin/Unasyn 11/11 Palliative care consulted, transitioned to comfort care 11/12 awaiting residential hospice bed 11/13 nursing staff feels patient not stable for transport to residential hospice as likely passing soon in house   Assessment & Plan:   Principal Problem:   Rhabdomyolysis Active Problems:   Essential hypertension   Mixed hyperlipidemia   Peripheral vascular disease (HCC)   CAD (coronary artery disease) of artery bypass graft   Rheumatoid arthritis (HCC)   Chronic diastolic CHF (congestive heart failure) (HCC)   Type 2 diabetes mellitus with diabetic polyneuropathy, with long-term current use of insulin (HCC)   Myositis   Constipation   LFT elevation   Malnutrition of moderate degree   Diffuse pain   Severe muscle deconditioning   Goals of care, counseling/discussion   Terminal care   DNR (do not resuscitate)   Palliative care by specialist  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 Neurology was consulted and followed during the hospital course. CK level peaked at 31,948. ESR 69. Adolase 77 (high), LDH 730. CMV negative. EBV IgG elevated 581 and EBV IgM <36.0. HSV/RSV negative. Ceruloplasm normal.  Alpha-1 antitrypsin normal. ANA negative. Mitochondial ab 24.4; equivocal. AsMA negative.  Acetylcholine receptor antibody negative. Anti-HMGCR Ab negative.  Home medications to include Plaquenil, statin, and Protonix were held for concern of toxic myositis.  Patient received 5-day course of IV Solu-Medrol 1 g followed by steroid taper with improvement of CK 31,948 to 622 and Myoglobin >30K to 2121.  --myositis panel/MyoMarker 3 Profile: pending --Palliative care following, transitioned to comfort measures --Pain control with morphine 2 mg every 1 hours prn --Lorazepam 0.5-1 mg IV every 1 hour as needed anxiety --Glycopyrrolate patch --Haldol as needed for agitation --Continue supportive care  Elevated LFTs: resolved AST 525 and ALT 252 on admission. Acute hepatitis panel nonreactive.  GGT 17, within normal limits.  Ultrasound with Doppler unrevealing. Etiology likely secondary to perihepatitis from autoimmune/inflammatory myositis.  Transaminitis resolved with IV steroid treatment.  Acute hypoxic respiratory failure Aspiration pneumonia Etiology likely secondary to severe muscle dysfunction from underlying rhabdomyolysis causing severe dysphagia.  Chest x-ray 07/18/2020 with significant infiltrate right lower lobe consistent with aspiration.  WBC count peaked at 37.5.  Patient was started on azithromycin and Unasyn with Mucomyst, scopolamine patch.  Now transitioned to comfort measures, discontinued IV antibiotic treatment.  Dysphagia EGD 07/18/2020 with no findings for esophageal stricture.  Core track initially placed for tube feeds.  Met with palliative care, now transitioned to comfort measures and discontinued artificial feeding.  Acute on chronic iron deficiency  anemia with melena Recent H. pylori infection, s/p treatment Recent EGD 06/17/2020 by [facility] with 2 nonbleeding duodenal ulcers   with biopsy positive for H. pylori.  Completed antibiotic treatment.  Ferritin level normal, iron level 11 (low), elevated reticulocyte count, B12 608, folate 18, Coombs test negative. Discontinued home Eliquis.  Received 2 unit PRBC on 07/16/2020.  Evaluated by GI during hospitalization with no further recommendations and signed off.  Now transitionined to comfort measures as above.  Acute on chronic diastolic congestive heart failure Initially treated with IV albumin followed by IV furosemide.  TTE 07/02/2020 with LVEF 10-25%, grade 1 diastolic dysfunction.  Comfort measures as above.  Hypernatremia Etiology likely secondary to poor oral intake/dehydration from dysphagia.  Initially placed on tube feeds with free water supplementation, now transitioned to comfort measures.  Recent LLE DVT Vascular duplex ultrasound 07/03/2020 with findings consistent with acute deep vein thrombosis involving the left posterior tibial veins, left peroneal veins, and left gastrocnemius veins; repeat vascular ultrasound 07/12/2020 with findings of persistent DVT.  Discontinued anticoagulation as transitioned to comfort measures.  Type 2 diabetes mellitus Hemoglobin A1c 6.1, well controlled.  Discontinuing CBGs and insulin has now transitioned to comfort measures.  Hyperlipidemia Discontinued home simvastatin 80 mg PO QHS 2/2 myositis and transaminitis as above  Rheumatoid arthritis On prednisone 5 mg p.o. daily, Plaquenil 200 mg p.o. twice daily, methotrexate 18m weekly (Sunday).  Discontinue home medications as transition to comfort measures.  CAD s/p CABG Essential Hypertension    DVT prophylaxis: Heparin Code Status: DNR; comfort measures Family Communication: No family present at bedside this am.  Disposition Plan:  Status is: Inpatient  Remains inpatient appropriate  because:Persistent severe electrolyte disturbances, Ongoing active pain requiring inpatient pain management, Ongoing diagnostic testing needed not appropriate for outpatient work up, Unsafe d/c plan, IV treatments appropriate due to intensity of illness or inability to take PO and Inpatient level of care appropriate due to severity of illness   Dispo: The patient is from: Home              Anticipated d/c is to: Anticipate in-hospital death              Anticipated d/c date is: 1 day              Patient currently is medically stable to d/c.  Consultants:   [facility] - signed off 11/7  Palliative care  Procedures:   Core track placement  Antimicrobials:   Azithromycin 11/9 - 11/11  Unasyn 11/9 - 11/11   Subjective: Patient seen and examined bedside, lying in bed.  Somnolent, nonresponsive to command.  Nursing present. No family present this morning.  Appears comfortable.  Anticipate in hospital death.  Objective: Vitals:   1Dec 08, [id number] 07/24/20 0533 07/24/20 1900 07/25/20 0450  BP: 129/60 (!) 164/87  128/65  Pulse: (!) 107 (!) 107  (!) 105  Resp: _0 Temp: (!) 101 F (38.3 C) 98.3 F (36.8 C) 99.7 F (37.6 C) 99.7 F (37.6 C)  TempSrc: Oral Oral Axillary Axillary  SpO2: (!) 89% (!) 87%  (!) 76%  Weight:      Height:        Intake/Output Summary (Last 24 hours) at 07/25/2020 1239 Last data filed at 07/25/2020 0501 Gross per 24 hour  Intake 0 ml  Output 1100 ml  Net -1100 ml   Filed Weights   07/16/20 0800 07/17/20 0327 07/19/20 0334  Weight: 67 kg 69.6 kg 70.9 kg    Examination:  General exam: Appears calm and comfortable; chronically ill/frail in appearance, somnolent Respiratory system: Coarse breath sounds  bilaterally, decreased breath sounds bilateral bases with crackles Cardiovascular system: S1 & S2 heard, RRR. No JVD, murmurs, rubs, gallops or clicks. No pedal edema. Gastrointestinal system: Abdomen is nondistended, soft and nontender. No  organomegaly or masses felt. Normal bowel sounds heard.   Central nervous system: Somnolent, unresponsive to command Extremities: No peripheral edema Skin: No rashes, lesions or ulcers Psychiatry: Somnolent    Data Reviewed: I have personally reviewed following labs and imaging studies  CBC: Recent Labs  Lab 07/19/20 0222 07/20/20 0712  WBC 19.5* 20.9*  HGB 9.8* 9.5*  HCT 31.2* 30.4*  MCV 100.6* 101.3*  PLT 115* 98*   Basic Metabolic Panel: Recent Labs  Lab 07/18/20 2105 07/19/20 0222 07/20/20 0533  NA 154* 152* 154*  K 3.1* 3.2* 4.2  CL 110 110 112*  CO2 32 32 31  GLUCOSE 368* 378* 381*  BUN 45* 46* 45*  CREATININE 0.55 0.46 0.54  CALCIUM 8.0* 8.0* 7.9*  MG  --   --  2.2  PHOS  --  1.9* 1.4*   GFR: Estimated Creatinine Clearance: 49.9 mL/min (by C-G formula based on SCr of 0.54 mg/dL). Liver Function Tests: Recent Labs  Lab 07/19/20 0222 07/20/20 0533  AST 39 36  ALT 221* 175*  ALKPHOS 54 55  BILITOT 0.9 0.7  PROT 4.5* 4.2*  ALBUMIN 2.3* 2.1*   No results for input(s): LIPASE, AMYLASE in the last 168 hours. No results for input(s): AMMONIA in the last 168 hours. Coagulation Profile: No results for input(s): INR, PROTIME in the last 168 hours. Cardiac Enzymes: Recent Labs  Lab 07/19/20 0222 07/20/20 0533  CKTOTAL 622* 424*   BNP (last 3 results) Recent Labs    08/31/19 1052  PROBNP 980*   HbA1C: No results for input(s): HGBA1C in the last 72 hours. CBG: Recent Labs  Lab 07/19/20 2324 07/19/20 2346 07/20/20 0334 07/20/20 0811 07/20/20 1112  GLUCAP 344* 374* 297* 255* 194*   Lipid Profile: No results for input(s): CHOL, HDL, LDLCALC, TRIG, CHOLHDL, LDLDIRECT in the last 72 hours. Thyroid Function Tests: No results for input(s): TSH, T4TOTAL, FREET4, T3FREE, THYROIDAB in the last 72 hours. Anemia Panel: No results for input(s): VITAMINB12, FOLATE, FERRITIN, TIBC, IRON, RETICCTPCT in the last 72 hours. Sepsis Labs: No results for  input(s): PROCALCITON, LATICACIDVEN in the last 168 hours.  Recent Results (from the past 240 hour(s))  Culture, Urine     Status: Abnormal   Collection Time: 07/16/20  4:29 PM   Specimen: Urine, Random  Result Value Ref Range Status   Specimen Description URINE, RANDOM  Final   Special Requests   Final    NONE Performed at [facility], [address], [place], [place] [address]    Culture MULTIPLE SPECIES PRESENT, SUGGEST RECOLLECTION (A)  Final   Report Status 07/17/2020 FINAL  Final         Radiology Studies: No results found.      Scheduled Meds: . chlorhexidine  15 mL Mouth Rinse BID  . furosemide  40 mg Intravenous Daily  . glycopyrrolate  0.4 mg Intravenous Q4H  . liver oil-zinc oxide  1 application Topical TID  . magic mouthwash  5 mL Oral QID  . mouth rinse  15 mL Mouth Rinse q12n4p  . scopolamine  1 patch Transdermal Q72H   Continuous Infusions:    LOS: 14 days    Time spent: 26 minutes spent on chart review, discussion with nursing staff, consultants, updating family and interview/physical exam; more  than 50% of that time was spent in counseling and/or coordination of care.    Antoneo Ghrist J [place] DO [facility] Available via Epic secure chat 7am-7pm After these hours, please refer to coverage provider listed on [email or web address] 07/25/2020, 12:39 PM

## 2020-07-25 NOTE — Progress Notes (Signed)
Pt having labored, shallow breathing anda furrowed brow.  Applied O2 per [place] for comfort at 2L.

## 2020-07-25 NOTE — Progress Notes (Signed)
Patient ID: Patricia Villegas, female   DOB: 05-21-1935, 85 y.o.   MRN: [id number]  This NP visited patient at the bedside as a follow up for palliative medicine needs and emotional support.   No family at bedside.   Nursing reported labored breathing.   Patient is minimally responsive to gentle touch and verbal stimuli. She is tachypnea.   Education offered to nursing regarding the utilization of as needed medications for end-of-life care. Education offered regarding the utilization of IV morphine, IV Ativan and IV Robinul all to treat symptoms to enhance comfort at end-of-life.    Prognosis is likely days.  PMT will continue to support holistically.  Questions and concerns addressed     Total time spent on the unit was 20 minutes  Greater than 50% of the time was spent in counseling and coordination of care  Wadie Lessen NP  Palliative Medicine Team Team Phone # [phone] Pager [phone]

## 2020-07-26 DIAGNOSIS — E139 Other specified diabetes mellitus without complications: Secondary | ICD-10-CM | POA: Diagnosis not present

## 2020-07-26 DIAGNOSIS — I25810 Atherosclerosis of coronary artery bypass graft(s) without angina pectoris: Secondary | ICD-10-CM | POA: Diagnosis not present

## 2020-07-26 DIAGNOSIS — I5032 Chronic diastolic (congestive) heart failure: Secondary | ICD-10-CM | POA: Diagnosis not present

## 2020-07-26 DIAGNOSIS — K5901 Slow transit constipation: Secondary | ICD-10-CM | POA: Diagnosis not present

## 2020-07-26 NOTE — Progress Notes (Signed)
PROGRESS NOTE    Patricia Villegas  MRN:[id number] DOB: 08/31/1935 DOA: 07/20/2020 PCP: Tisovec, Richard W, MD    Brief Narrative:  Patricia Villegas is a 85-year-old female with past medical history remarkable for rheumatoid arthritis, peripheral vascular disease, type 2 diabetes mellitus, essential hypertension, hyperlipidemia, CAD s/p CABG who presented to the ED with leg pain. Patient was noted to have severe rhabdomyolysis, anemia and transaminitis. Neurology and GI were consulted. Hospitalist service consulted for admission.  Significant events: 11/2 admitted for rhabdomyolysis started on IV fluids. 11/4-11/8 IV Solu-Medrol 1 g. 11/6 transferred to progressive care unit due to worsening respiratory distress. 11/7 GI signed off.  No intervention planned.  Continue PPI twice daily. 11/8 cortrack inserted 11/9 CXR with RLL infiltrate consistent with aspiration, started on azithromycin/Unasyn 11/11 Palliative care consulted, transitioned to comfort care 11/12 awaiting residential hospice bed 11/13 nursing staff feels patient not stable for transport to residential hospice as likely passing soon in house   Assessment & Plan:   Principal Problem:   Rhabdomyolysis Active Problems:   Essential hypertension   Mixed hyperlipidemia   Peripheral vascular disease (HCC)   CAD (coronary artery disease) of artery bypass graft   Rheumatoid arthritis (HCC)   Chronic diastolic CHF (congestive heart failure) (HCC)   Type 2 diabetes mellitus with diabetic polyneuropathy, with long-term current use of insulin (HCC)   Myositis   Constipation   LFT elevation   Malnutrition of moderate degree   Diffuse pain   Severe muscle deconditioning   Goals of care, counseling/discussion   Terminal care   DNR (do not resuscitate)   Palliative care by specialist  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 Neurology was consulted and followed during the hospital course. CK level peaked at 31,948. ESR 69. Adolase 77 (high), LDH 730. CMV negative. EBV IgG elevated 581 and EBV IgM <36.0. HSV/RSV negative. Ceruloplasm normal.  Alpha-1 antitrypsin normal. ANA negative. Mitochondial ab 24.4; equivocal. AsMA negative.  Acetylcholine receptor antibody negative. Anti-HMGCR Ab negative.  Home medications to include Plaquenil, statin, and Protonix were held for concern of toxic myositis.  Patient received 5-day course of IV Solu-Medrol 1 g followed by steroid taper with improvement of CK 31,948 to 622 and Myoglobin >30K to 2121.  --myositis panel/MyoMarker 3 Profile: pending --Palliative care following, transitioned to comfort measures --Pain control with morphine 2 mg every 1 hours prn --Lorazepam 0.5-1 mg IV every 1 hour as needed anxiety --Glycopyrrolate patch --Haldol as needed for agitation --Continue supportive care  Elevated LFTs: resolved AST 525 and ALT 252 on admission. Acute hepatitis panel nonreactive.  GGT 17, within normal limits.  Ultrasound with Doppler unrevealing. Etiology likely secondary to perihepatitis from autoimmune/inflammatory myositis.  Transaminitis resolved with IV steroid treatment.  Acute hypoxic respiratory failure Aspiration pneumonia Etiology likely secondary to severe muscle dysfunction from underlying rhabdomyolysis causing severe dysphagia.  Chest x-ray 07/18/2020 with significant infiltrate right lower lobe consistent with aspiration.  WBC count peaked at 37.5.  Patient was started on azithromycin and Unasyn with Mucomyst, scopolamine patch.  Now transitioned to comfort measures, discontinued IV antibiotic treatment.  Dysphagia EGD 07/18/2020 with no findings for esophageal stricture.  Core track initially placed for tube feeds.  Met with palliative care, now transitioned to comfort measures and discontinued artificial feeding.  Acute on chronic iron deficiency  anemia with melena Recent H. pylori infection, s/p treatment Recent EGD 06/17/2020 by [facility] with 2 nonbleeding duodenal ulcers   with biopsy positive for H. pylori.  Completed antibiotic treatment.  Ferritin level normal, iron level 11 (low), elevated reticulocyte count, B12 608, folate 18, Coombs test negative. Discontinued home Eliquis.  Received 2 unit PRBC on 07/16/2020.  Evaluated by GI during hospitalization with no further recommendations and signed off.  Now transitionined to comfort measures as above.  Acute on chronic diastolic congestive heart failure Initially treated with IV albumin followed by IV furosemide.  TTE 07/02/2020 with LVEF 37-34%, grade 1 diastolic dysfunction.  Comfort measures as above.  Hypernatremia Etiology likely secondary to poor oral intake/dehydration from dysphagia.  Initially placed on tube feeds with free water supplementation, now transitioned to comfort measures.  Recent LLE DVT Vascular duplex ultrasound 07/03/2020 with findings consistent with acute deep vein thrombosis involving the left posterior tibial veins, left peroneal veins, and left gastrocnemius veins; repeat vascular ultrasound 07/12/2020 with findings of persistent DVT.  Discontinued anticoagulation as transitioned to comfort measures.  Type 2 diabetes mellitus Hemoglobin A1c 6.1, well controlled.  Discontinuing CBGs and insulin has now transitioned to comfort measures.  Hyperlipidemia Discontinued home simvastatin 80 mg PO QHS 2/2 myositis and transaminitis as above  Rheumatoid arthritis On prednisone 5 mg p.o. daily, Plaquenil 200 mg p.o. twice daily, methotrexate 3m weekly (Sunday).  Discontinue home medications as transition to comfort measures.  CAD s/p CABG Essential Hypertension    DVT prophylaxis: Heparin Code Status: DNR; comfort measures Family Communication: No family present at bedside this am.  Disposition Plan:  Status is: Inpatient  Remains inpatient appropriate  because:Persistent severe electrolyte disturbances, Ongoing active pain requiring inpatient pain management, Ongoing diagnostic testing needed not appropriate for outpatient work up, Unsafe d/c plan, IV treatments appropriate due to intensity of illness or inability to take PO and Inpatient level of care appropriate due to severity of illness   Dispo: The patient is from: Home              Anticipated d/c is to: Anticipate in-hospital death              Anticipated d/c date is: 1 day              Patient currently is medically stable to d/c.  Consultants:   [facility] - signed off 11/7  Palliative care  Procedures:   Core track placement  Antimicrobials:   Azithromycin 11/9 - 11/11  Unasyn 11/9 - 11/11   Subjective: Patient seen and examined bedside, lying in bed.  Somnolent, nonresponsive to command.   No family present this morning.  Appears comfortable.  Anticipate in hospital death.  Objective: Vitals:   111/24/20211900 07/25/20 0450 07/25/20 1400 07/26/20 0613  BP:  128/65 (!) 142/72 (!) 155/67  Pulse:  (!) 105 94 91  Resp:  15  18  Temp: 99.7 F (37.6 C) 99.7 F (37.6 C) 98.4 F (36.9 C) 98.8 F (37.1 C)  TempSrc: Axillary Axillary Axillary Axillary  SpO2:  (!) 76% 91% (!) 85%  Weight:      Height:        Intake/Output Summary (Last 24 hours) at 07/26/2020 1119 Last data filed at 07/26/2020 1010 Gross per 24 hour  Intake 0 ml  Output 750 ml  Net -750 ml   Filed Weights   07/16/20 0800 07/17/20 0327 07/19/20 0334  Weight: 67 kg 69.6 kg 70.9 kg    Examination:  General exam: Appears calm and comfortable; chronically ill/frail in appearance, somnolent, unresponsive to verbal command Respiratory system: Coarse breath sounds  bilaterally, decreased breath sounds bilateral bases with crackles Cardiovascular system: S1 & S2 heard, RRR. No JVD, murmurs, rubs, gallops or clicks. No pedal edema. Gastrointestinal system: Abdomen is nondistended, soft and  nontender. No organomegaly or masses felt. Normal bowel sounds heard.   Central nervous system: Somnolent, unresponsive to command Extremities: No peripheral edema Skin: No rashes, lesions or ulcers Psychiatry: Somnolent    Data Reviewed: I have personally reviewed following labs and imaging studies  CBC: Recent Labs  Lab 07/20/20 0712  WBC 20.9*  HGB 9.5*  HCT 30.4*  MCV 101.3*  PLT 98*   Basic Metabolic Panel: Recent Labs  Lab 07/20/20 0533  NA 154*  K 4.2  CL 112*  CO2 31  GLUCOSE 381*  BUN 45*  CREATININE 0.54  CALCIUM 7.9*  MG 2.2  PHOS 1.4*   GFR: Estimated Creatinine Clearance: 49.9 mL/min (by C-G formula based on SCr of 0.54 mg/dL). Liver Function Tests: Recent Labs  Lab 07/20/20 0533  AST 36  ALT 175*  ALKPHOS 55  BILITOT 0.7  PROT 4.2*  ALBUMIN 2.1*   No results for input(s): LIPASE, AMYLASE in the last 168 hours. No results for input(s): AMMONIA in the last 168 hours. Coagulation Profile: No results for input(s): INR, PROTIME in the last 168 hours. Cardiac Enzymes: Recent Labs  Lab 07/20/20 0533  CKTOTAL 424*   BNP (last 3 results) Recent Labs    08/31/19 1052  PROBNP 980*   HbA1C: No results for input(s): HGBA1C in the last 72 hours. CBG: Recent Labs  Lab 07/19/20 2324 07/19/20 2346 07/20/20 0334 07/20/20 0811 07/20/20 1112  GLUCAP 344* 374* 297* 255* 194*   Lipid Profile: No results for input(s): CHOL, HDL, LDLCALC, TRIG, CHOLHDL, LDLDIRECT in the last 72 hours. Thyroid Function Tests: No results for input(s): TSH, T4TOTAL, FREET4, T3FREE, THYROIDAB in the last 72 hours. Anemia Panel: No results for input(s): VITAMINB12, FOLATE, FERRITIN, TIBC, IRON, RETICCTPCT in the last 72 hours. Sepsis Labs: No results for input(s): PROCALCITON, LATICACIDVEN in the last 168 hours.  Recent Results (from the past 240 hour(s))  Culture, Urine     Status: Abnormal   Collection Time: 07/16/20  4:29 PM   Specimen: Urine, Random   Result Value Ref Range Status   Specimen Description URINE, RANDOM  Final   Special Requests   Final    NONE Performed at [facility], [address], [place], [place] [address]    Culture MULTIPLE SPECIES PRESENT, SUGGEST RECOLLECTION (A)  Final   Report Status 07/17/2020 FINAL  Final         Radiology Studies: No results found.      Scheduled Meds: . chlorhexidine  15 mL Mouth Rinse BID  . glycopyrrolate  0.4 mg Intravenous Q4H  . liver oil-zinc oxide  1 application Topical TID  . mouth rinse  15 mL Mouth Rinse q12n4p  . scopolamine  1 patch Transdermal Q72H   Continuous Infusions:    LOS: 15 days    Time spent: 25 minutes spent on chart review, discussion with nursing staff, consultants, updating family and interview/physical exam; more than 50% of that time was spent in counseling and/or coordination of care.    Trachelle Low J [place] DO [facility] Available via Epic secure chat 7am-7pm After these hours, please refer to coverage provider listed on [email or web address] 07/26/2020, 11:19 AM

## 2020-07-27 DIAGNOSIS — R0600 Dyspnea, unspecified: Secondary | ICD-10-CM

## 2020-07-27 LAB — MISC LABCORP TEST (SEND OUT): Labcorp test code: 520080

## 2020-07-27 MED ORDER — MORPHINE SULFATE (PF) 2 MG/ML IV SOLN
2.0000 mg | INTRAVENOUS | Status: DC | PRN
  Administered 2020-07-28: 2 mg via INTRAVENOUS
  Filled 2020-07-27: qty 1

## 2020-07-27 MED ORDER — MORPHINE SULFATE (PF) 2 MG/ML IV SOLN
2.0000 mg | Freq: Four times a day (QID) | INTRAVENOUS | Status: DC
  Administered 2020-07-27 – 2020-07-28 (×5): 2 mg via INTRAVENOUS
  Filled 2020-07-27 (×5): qty 1

## 2020-07-27 NOTE — Progress Notes (Signed)
This chaplain is pastorally present bedside with the Pt. and Pt. husband-Billy.  In the peaceful space, the chaplain listened as Patricia Villegas participated in decades of story telling. The chaplain honored a mixture of Billy's grief and the Pt. spirituality in their time together.  The spiritual care visit closed with prayer and an accepted invitation to return.

## 2020-07-27 NOTE — Progress Notes (Signed)
Patient ID: Patricia Villegas, female   DOB: 08/25/1935, 85 y.o.   MRN: [id number]  This NP visited patient at the bedside as a follow up for palliative medicine needs and emotional support.     Patient appears comfortable.     Husband at bedside. He verbalizes "wondering" about timing of death.  It is hard  "waiting", he thought her passing would be sooner.  Education offered on the natural trajectory and expectations at EOL  Education offered to nursing regarding the utilization of as needed medications for end-of-life care. Education offered regarding the utilization of IV morphine, IV Ativan and IV Robinul all to treat symptoms to enhance comfort at end-of-life.    Prognosis is likely hours to days.  PMT will continue to support holistically.  Questions and concerns addressed     Total time spent on the unit was 25 minutes  Greater than 50% of the time was spent in counseling and coordination of care  Patricia Lessen NP  Palliative Medicine Team Team Phone # [phone] Pager [phone]

## 2020-07-27 NOTE — Progress Notes (Signed)
PROGRESS NOTE    Patricia Villegas  QIW:[id number] DOB: November 27, 1934 DOA: 08/04/2020 PCP: Haywood Pao, MD    Brief Narrative:  Patricia Villegas is a 85 year old female with past medical history remarkable for rheumatoid arthritis, peripheral vascular disease, type 2 diabetes mellitus, essential hypertension, hyperlipidemia, CAD s/p CABG who presented to the ED with leg pain. Patient was noted to have severe rhabdomyolysis, anemia and transaminitis. Neurology and GI were consulted. Hospitalist service consulted for admission.  Significant events: 11/2 admitted for rhabdomyolysis started on IV fluids. 11/4-11/8 IV Solu-Medrol 1 g. 11/6 transferred to progressive care unit due to worsening respiratory distress. 11/7 GI signed off.  No intervention planned.  Continue PPI twice daily. 11/8 cortrack inserted 11/9 CXR with RLL infiltrate consistent with aspiration, started on azithromycin/Unasyn 11/11 Palliative care consulted, transitioned to comfort care 11/12 awaiting residential hospice bed 11/13 nursing staff feels patient not stable for transport to residential hospice as likely passing soon in house   Assessment & Plan:   Principal Problem:   Rhabdomyolysis Active Problems:   Essential hypertension   Mixed hyperlipidemia   Peripheral vascular disease ([place])   CAD (coronary artery disease) of artery bypass graft   Rheumatoid arthritis ([place])   Chronic diastolic CHF (congestive heart failure) ([place])   Type 2 diabetes mellitus with diabetic polyneuropathy, with long-term current use of insulin (HCC)   Myositis   Constipation   LFT elevation   Malnutrition of moderate degree   Diffuse pain   Severe muscle deconditioning   Goals of care, counseling/discussion   End of life care   DNR (do not resuscitate)   Palliative care by specialist   Dyspnea  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 Neurology was consulted and followed during the hospital course. CK level peaked at 31,948. ESR 69. Adolase 77 (high), LDH 730. CMV negative. EBV IgG elevated 581 and EBV IgM <36.0. HSV/RSV negative. Ceruloplasm normal.  Alpha-1 antitrypsin normal. ANA negative. Mitochondial ab 24.4; equivocal. AsMA negative.  Acetylcholine receptor antibody negative. Anti-HMGCR Ab negative.  Home medications to include Plaquenil, statin, and Protonix were held for concern of toxic myositis.  Patient received 5-day course of IV Solu-Medrol 1 g followed by steroid taper with improvement of CK 31,948 to 622 and Myoglobin >30K to 2121.  --myositis panel/MyoMarker 3 Profile: pending --Palliative care following, transitioned to comfort measures --Pain control with morphine 2 mg every 1 hours prn --Lorazepam 0.5-1 mg IV every 1 hour as needed anxiety --Glycopyrrolate patch --Haldol as needed for agitation --Continue supportive care  Elevated LFTs: resolved AST 525 and ALT 252 on admission. Acute hepatitis panel nonreactive.  GGT 17, within normal limits.  Ultrasound with Doppler unrevealing. Etiology likely secondary to perihepatitis from autoimmune/inflammatory myositis.  Transaminitis resolved with IV steroid treatment.  Acute hypoxic respiratory failure Aspiration pneumonia Etiology likely secondary to severe muscle dysfunction from underlying rhabdomyolysis causing severe dysphagia.  Chest x-ray 07/18/2020 with significant infiltrate right lower lobe consistent with aspiration.  WBC count peaked at 37.5.  Patient was started on azithromycin and Unasyn with Mucomyst, scopolamine patch.  Now transitioned to comfort measures, discontinued IV antibiotic treatment.  Dysphagia EGD 07/18/2020 with no findings for esophageal stricture.  Core track initially placed for tube feeds.  Met with palliative care, now transitioned to comfort measures and discontinued artificial feeding.  Acute on chronic iron  deficiency anemia with melena Recent H. pylori infection, s/p treatment Recent EGD 06/17/2020 by [facility]  with 2 nonbleeding duodenal ulcers with biopsy positive for H. pylori.  Completed antibiotic treatment.  Ferritin level normal, iron level 11 (low), elevated reticulocyte count, B12 608, folate 18, Coombs test negative. Discontinued home Eliquis.  Received 2 unit PRBC on 07/16/2020.  Evaluated by GI during hospitalization with no further recommendations and signed off.  Now transitionined to comfort measures as above.  Acute on chronic diastolic congestive heart failure Initially treated with IV albumin followed by IV furosemide.  TTE 07/02/2020 with LVEF 57-01%, grade 1 diastolic dysfunction.  Comfort measures as above.  Hypernatremia Etiology likely secondary to poor oral intake/dehydration from dysphagia.  Initially placed on tube feeds with free water supplementation, now transitioned to comfort measures.  Recent LLE DVT Vascular duplex ultrasound 07/03/2020 with findings consistent with acute deep vein thrombosis involving the left posterior tibial veins, left peroneal veins, and left gastrocnemius veins; repeat vascular ultrasound 07/12/2020 with findings of persistent DVT.  Discontinued anticoagulation as transitioned to comfort measures.  Type 2 diabetes mellitus Hemoglobin A1c 6.1, well controlled.  Discontinuing CBGs and insulin has now transitioned to comfort measures.  Hyperlipidemia Discontinued home simvastatin 80 mg PO QHS 2/2 myositis and transaminitis as above  Rheumatoid arthritis On prednisone 5 mg p.o. daily, Plaquenil 200 mg p.o. twice daily, methotrexate 49m weekly (Sunday).  Discontinue home medications as transition to comfort measures.  CAD s/p CABG Essential Hypertension    DVT prophylaxis: Heparin Code Status: DNR; comfort measures Family Communication: No family present at bedside this am.  Disposition Plan:  Status is: Inpatient  Remains inpatient  appropriate because:Persistent severe electrolyte disturbances, Ongoing active pain requiring inpatient pain management, Ongoing diagnostic testing needed not appropriate for outpatient work up, Unsafe d/c plan, IV treatments appropriate due to intensity of illness or inability to take PO and Inpatient level of care appropriate due to severity of illness   Dispo: The patient is from: Home              Anticipated d/c is to: Anticipate in-hospital death              Anticipated d/c date is: 1 day              Patient currently is medically stable to d/c.  Consultants:   [facility] - signed off 11/7  Palliative care  Procedures:   Core track placement  Antimicrobials:   Azithromycin 11/9 - 11/11  Unasyn 11/9 - 11/11   Subjective: Patient seen and examined bedside, lying in bed.  Somnolent, nonresponsive to command.   No family present this morning.  Appears comfortable.  Anticipate in hospital death.  Objective: Vitals:   111/28/20210450 [id number] 07/26/20 0613 07/27/20 0426  BP: 128/65 (!) 142/72 (!) 155/67 (!) 141/64  Pulse: (!) 105 94 91 94  Resp: 15  18 17   Temp: 99.7 F (37.6 C) 98.4 F (36.9 C) 98.8 F (37.1 C) 98.3 F (36.8 C)  TempSrc: Axillary Axillary Axillary Oral  SpO2: (!) 76% 91% (!) 85% (!) 82%  Weight:      Height:        Intake/Output Summary (Last 24 hours) at 07/27/2020 1302 Last data filed at 07/27/2020 0938 Gross per 24 hour  Intake 0 ml  Output 450 ml  Net -450 ml   Filed Weights   07/16/20 0800 07/17/20 0327 07/19/20 0334  Weight: 67 kg 69.6 kg 70.9 kg    Examination:  General exam: Appears calm and comfortable; chronically ill/frail in appearance, somnolent, unresponsive to  verbal command Respiratory system: Coarse breath sounds bilaterally, decreased breath sounds bilateral bases with crackles Cardiovascular system: S1 & S2 heard, RRR. No JVD, murmurs, rubs, gallops or clicks. No pedal edema. Gastrointestinal system: Abdomen is  nondistended, soft and nontender. No organomegaly or masses felt. Normal bowel sounds heard.   Central nervous system: Somnolent, unresponsive to command Extremities: No peripheral edema Skin: No rashes, lesions or ulcers Psychiatry: Somnolent    Data Reviewed: I have personally reviewed following labs and imaging studies  CBC: No results for input(s): WBC, NEUTROABS, HGB, HCT, MCV, PLT in the last 168 hours. Basic Metabolic Panel: No results for input(s): NA, K, CL, CO2, GLUCOSE, BUN, CREATININE, CALCIUM, MG, PHOS in the last 168 hours. GFR: Estimated Creatinine Clearance: 49.9 mL/min (by C-G formula based on SCr of 0.54 mg/dL). Liver Function Tests: No results for input(s): AST, ALT, ALKPHOS, BILITOT, PROT, ALBUMIN in the last 168 hours. No results for input(s): LIPASE, AMYLASE in the last 168 hours. No results for input(s): AMMONIA in the last 168 hours. Coagulation Profile: No results for input(s): INR, PROTIME in the last 168 hours. Cardiac Enzymes: No results for input(s): CKTOTAL, CKMB, CKMBINDEX, TROPONINI in the last 168 hours. BNP (last 3 results) Recent Labs    08/31/19 1052  PROBNP 980*   HbA1C: No results for input(s): HGBA1C in the last 72 hours. CBG: No results for input(s): GLUCAP in the last 168 hours. Lipid Profile: No results for input(s): CHOL, HDL, LDLCALC, TRIG, CHOLHDL, LDLDIRECT in the last 72 hours. Thyroid Function Tests: No results for input(s): TSH, T4TOTAL, FREET4, T3FREE, THYROIDAB in the last 72 hours. Anemia Panel: No results for input(s): VITAMINB12, FOLATE, FERRITIN, TIBC, IRON, RETICCTPCT in the last 72 hours. Sepsis Labs: No results for input(s): PROCALCITON, LATICACIDVEN in the last 168 hours.  No results found for this or any previous visit (from the past 240 hour(s)).       Radiology Studies: No results found.      Scheduled Meds: . chlorhexidine  15 mL Mouth Rinse BID  . glycopyrrolate  0.4 mg Intravenous Q4H  . liver  oil-zinc oxide  1 application Topical TID  . mouth rinse  15 mL Mouth Rinse q12n4p  . scopolamine  1 patch Transdermal Q72H   Continuous Infusions:    LOS: 16 days    Time spent: 25 minutes spent on chart review, discussion with nursing staff, consultants, updating family and interview/physical exam; more than 50% of that time was spent in counseling and/or coordination of care.    Harim Bi J [place] DO [facility] Available via Epic secure chat 7am-7pm After these hours, please refer to coverage provider listed on [email or web address] 07/27/2020, 1:02 PM

## 2020-07-28 DIAGNOSIS — M6282 Rhabdomyolysis: Secondary | ICD-10-CM | POA: Diagnosis not present

## 2020-07-28 DIAGNOSIS — I25810 Atherosclerosis of coronary artery bypass graft(s) without angina pectoris: Secondary | ICD-10-CM | POA: Diagnosis not present

## 2020-07-28 MED ORDER — ALTEPLASE 2 MG IJ SOLR
2.0000 mg | Freq: Once | INTRAMUSCULAR | Status: AC
  Administered 2020-07-28: 2 mg

## 2020-07-28 MED ORDER — ALTEPLASE 2 MG IJ SOLR
2.0000 mg | Freq: Once | INTRAMUSCULAR | Status: AC
  Administered 2020-07-28: 2 mg
  Filled 2020-07-28: qty 2

## 2020-08-09 NOTE — Progress Notes (Signed)
This chaplain is present for prayer with the Pt.

## 2020-08-09 NOTE — Progress Notes (Signed)
Each PICC lumen TPA'd and capped. Joan,RN notified and is aware lines are not usable until TPA is withdrawn

## 2020-08-09 NOTE — Progress Notes (Signed)
Received patient on full comfort care, DNR. Patient was noted with no pulse, no respiration and unresponsive, MD was made aware, patient's son Joyelle Siedlecki was made aware. [facility] was also made aware. Patient's belongings is still at bedside, awaiting for family to come and pick it up.

## 2020-08-09 NOTE — Progress Notes (Signed)
Patient's body transferred to the morgue, patient placement made aware, a crochet made snowman was placed in the labeled belonging bag and endorsed to the secretary.

## 2020-08-09 NOTE — Progress Notes (Signed)
Spoke with Remo Lipps, RN for pt regarding the red port with NBR. Resistance met when flushing-cap changed. Was told the pt is getting comfort care and only receives pain management IVP. Lumen 2 flushes with GBR.

## 2020-08-09 NOTE — Discharge Summary (Signed)
Physician Discharge Summary  Patricia Villegas DJT:[id number] DOB: [birth year] DOA: 07/22/2020  PCP: Haywood Pao, MD  Admit date: 07/26/2020 Discharge date: 04-Aug-2020   Discharge Diagnoses/Plan: Principal Problem:   Rhabdomyolysis Active Problems:   Essential hypertension   Mixed hyperlipidemia   Peripheral vascular disease ([place])   CAD (coronary artery disease) of artery bypass graft   Rheumatoid arthritis ([place])   Chronic diastolic CHF (congestive heart failure) ([place])   Type 2 diabetes mellitus with diabetic polyneuropathy, with long-term current use of insulin (HCC)   Myositis   Constipation   LFT elevation   Malnutrition of moderate degree   Diffuse pain   Severe muscle deconditioning   Goals of care, counseling/discussion   End of life care   DNR (do not resuscitate)   Palliative care by specialist   Dyspnea   Discharge Condition:  Expired  Diet recommendation:  N/A  Filed Weights   07/16/20 0800 07/17/20 0327 07/19/20 0334  Weight: 67 kg 69.6 kg 70.9 kg    Discharge activity: Expired   History of present illness:  Patricia Villegas is a 85 y.o. female with medical history significant of RA; [name]; DM; HTN; [name]; and CAD s/p CABG presenting with leg pain.  She was initially admitted for a R total hip arthroplasty on 9/21; she was discharged to home with [facility] on 9/22.   She returned to the ER with SOB on 9/30; DVT US was negative, Hgb was low but stable at 7.3, and the patient was discharged back to home.  She was readmitted from 10/6-10 for symptomatic anemia thought to be associated with ABLA from duodenal ulcers causing UGI bleeding.  She was again discharged to home.  She returned from 10/23-28 for generalized weakness with AKI and acute left leg DVT; she was started on Protonix and Eliquis and this time was discharged to [place] for rehab. Since d/c, she is unable to function with PT.  Her LE and core basically can't function.  She is having severe pain  with tingling, burning, numbness in her heels.  She is unable to lift her legs.  She was doing well in therapy but got so weak and wasn't able to do anything.  Excruciating pain from her left knee to ankle, which is where she had the clot.  Both arm weakness without pain, left is worse than right.  She is right hand dominant.    ED Course: Several days of leg pains.  Known [name], h/o DVTs on AC.  Withering away since prior hip fracture surgery.  CK elevated.  Myositis vs. rhabdo.  Hospital Course:  Significant events: 11/2 admitted for rhabdomyolysis started on IV fluids. 11/4-11/8IV Solu-Medrol 1 g. 11/6 transferred to progressive care unit due to worsening respiratory distress. 11/7 GI signed off. No intervention planned. Continue PPI twice daily. 11/8 cortrack inserted 11/9 CXR with RLL infiltrate consistent with aspiration, started on azithromycin/Unasyn 11/11 Palliative care consulted, transitioned to comfort care 11/12 awaiting residential hospice bed 11/13 nursing staff feels patient not stable for transport to residential hospice as likely passing soon in house  Procedures:  None   Consultations:  Palliative care.  Discharge Exam: Vitals:   08-04-2020 0530 08-04-20 1011  BP: (!) 93/46 (!) 85/40  Pulse: 90 85  Resp: 13   Temp: 98.2 F (36.8 C)   SpO2: (!) 76% (!) 75%    Physical Exam ON am round pt was breathing and pt expired at 15:35 today.   Discharge Instructions  Discharge Instructions    Diet - low sodium heart healthy   Complete by: As directed    Increase activity slowly   Complete by: As directed    No wound care   Complete by: As directed      Allergies as of 05-Aug-2020   No Known Allergies   Med Rec must be completed prior to using this Point   No Known Allergies  The results of significant diagnostics from this hospitalization (including imaging, microbiology, ancillary and laboratory) are listed below for reference.     Significant Diagnostic Studies: DG Chest 1 View  Result Date: 07/02/2020 CLINICAL DATA:  Fall EXAM: CHEST  1 VIEW COMPARISON:  June 14, 2020 FINDINGS: The heart size and mediastinal contours unchanged with mild cardiomegaly. Aortic knob and mitral valve calcifications are seen. Overlying median sternotomy wires are present. Both lungs are clear. The visualized skeletal structures are unremarkable. IMPRESSION: No active disease. Electronically Signed   By: Prudencio Pair M.D.   On: 07/02/2020 00:17   DG Pelvis 1-2 Views  Result Date: 07/02/2020 CLINICAL DATA:  Fall EXAM: PELVIS - 1-2 VIEW COMPARISON:  None. FINDINGS: The patient is status post bilateral total hip arthroplasty. No periprosthetic lucency or fracture is identified. There are dense vascular calcifications. Diffuse osteopenia seen. Fixation hardware in the lower lumbar spine. IMPRESSION: Status post bilateral total hip arthroplasty without definite acute complication. Electronically Signed   By: Prudencio Pair M.D.   On: 07/02/2020 00:19   CT Lumbar Spine Wo Contrast  Result Date: 07/01/2020 CLINICAL DATA:  Lower back pain, trauma EXAM: CT LUMBAR SPINE WITHOUT CONTRAST TECHNIQUE: Multidetector CT imaging of the lumbar spine was performed without intravenous contrast administration. Multiplanar CT image reconstructions were also generated. COMPARISON:  None. FINDINGS: Segmentation: There are 5 non-rib bearing lumbar type vertebral bodies with the last intervertebral disc space labeled as L5-S1. Alignment: There is a minimal leftward curvature of the lumbar spine. Vertebrae: There is an age indeterminate, however likely subacute compression fracture at the inferior endplate of L1 with less than 25% loss in height. There is mildly displaced fracture seen through the anteroinferior right-sided endplate. There is buckling of the posteroinferior endplate causing mild canal narrowing. There is diffuse osteopenia. Interbody fixation device is seen  at L4-L5. Paraspinal and other soft tissues: The paraspinal soft tissues and visualized retroperitoneal structures are unremarkable. The sacroiliac joints are intact. Scattered dense vascular calcifications are noted. Low-density lesion seen partially exophytic the upper pole scattered colonic diverticula noted Disc levels: Multilevel lumbar spine spondylosis is seen most notable at L5-S1 with facet arthropathy causing severe right and moderate left neural foraminal narrowing IMPRESSION: Age indeterminate, however likely subacute inferior compression fracture of the L1 vertebral body with less than 25% height. There is buckling of the posteroinferior cortex causing mild canal narrowing Electronically Signed   By: Prudencio Pair M.D.   On: 07/01/2020 22:59   CT ABDOMEN PELVIS W CONTRAST  Result Date: 07/16/2020 CLINICAL DATA:  Anemia EXAM: CT ABDOMEN AND PELVIS WITH CONTRAST TECHNIQUE: Multidetector CT imaging of the abdomen and pelvis was performed using the standard protocol following bolus administration of intravenous contrast. CONTRAST:  120mL OMNIPAQUE IOHEXOL 300 MG/ML  SOLN COMPARISON:  07/16/2020, 07/12/2020 FINDINGS: Lower chest: There are small bilateral pleural effusions. New right lower lobe airspace disease which could reflect aspiration or pneumonia. Minimal dependent lower lobe atelectasis. Dense calcification of the mitral valve unchanged. Hepatobiliary: Calcified gallstones are again identified without cholecystitis. Liver is unremarkable. Pancreas: Unremarkable. No pancreatic  ductal dilatation or surrounding inflammatory changes. Spleen: Normal in size without focal abnormality. Adrenals/Urinary Tract: Stable right renal cyst. Otherwise the kidneys enhance normally. The adrenals and bladder are unremarkable. Stomach/Bowel: Dense oral contrast is seen throughout the colon. No bowel obstruction or ileus. No wall thickening or inflammatory change. Vascular/Lymphatic: Aortic atherosclerosis. No  enlarged abdominal or pelvic lymph nodes. Reproductive: Status post hysterectomy. No adnexal masses. Other: No free fluid or free gas.  No abdominal wall hernia. Musculoskeletal: Chronic compression deformities are seen at T11 and L1. Stable postsurgical changes at the L4/L5 level. Bilateral hip arthroplasties are stable. There are no acute bony abnormalities. Reconstructed images demonstrate no additional findings. IMPRESSION: 1. Right lower lobe airspace disease which could reflect infection or aspiration. 2. Stable small bilateral pleural effusions. 3. Cholelithiasis without cholecystitis. 4.  Aortic Atherosclerosis (ICD10-I70.0). Electronically Signed   By: Randa Ngo M.D.   On: 07/16/2020 23:42   CT ABDOMEN PELVIS W CONTRAST  Result Date: 07/12/2020 CLINICAL DATA:  Abdominal distention EXAM: CT ABDOMEN AND PELVIS WITH CONTRAST TECHNIQUE: Multidetector CT imaging of the abdomen and pelvis was performed using the standard protocol following bolus administration of intravenous contrast. CONTRAST:  143mL OMNIPAQUE IOHEXOL 300 MG/ML  SOLN COMPARISON:  None. FINDINGS: Lower chest: Trace bilateral pleural effusions. Dependent atelectasis. Densely calcified mitral valve. Hepatobiliary: Small gallstones layering within the gallbladder. No focal hepatic abnormality. Pancreas: No focal abnormality or ductal dilatation. Spleen: No focal abnormality.  Normal size. Adrenals/Urinary Tract: Small cyst off the upper pole of the right kidney. No hydronephrosis. No renal or ureteral stones. Urinary bladder and adrenal glands unremarkable. Stomach/Bowel: Few scattered left colonic diverticula. No active diverticulitis. Stomach and small bowel decompressed. No bowel obstruction. Vascular/Lymphatic: Aortic atherosclerosis. No evidence of aneurysm or adenopathy. Reproductive: Prior hysterectomy.  No adnexal masses. Other: No free fluid or free air. Musculoskeletal: Bilateral hip replacements. Postoperative changes in the  lower lumbar spine. Mild compression fracture through the inferior endplate of L1 and superior endplate of Y30. IMPRESSION: Trace bilateral pleural effusions.  Bibasilar atelectasis. Cholelithiasis. Aortic atherosclerosis. No acute findings in the abdomen or pelvis. Electronically Signed   By: Rolm Baptise M.D.   On: 07/12/2020 02:14   VAS [place] IVC/ILIAC (VENOUS ONLY)  Result Date: 07/03/2020 IVC/ILIAC STUDY Limitations: Air/bowel gas.  Comparison Study: 06/09/2020- negative right lower extremity venous duplex Performing Technologist: Maudry Mayhew RDMS, RVT, RDCS  Examination Guidelines: A complete evaluation includes B-mode imaging, spectral Doppler, color Doppler, and power Doppler as needed of all accessible portions of each vessel. Bilateral testing is considered an integral part of a complete examination. Limited examinations for reoccurring indications may be performed as noted.  +----------------+---------+-----------+---------+-----------+--------+       CIV       RT-PatentRT-ThrombusLT-PatentLT-ThrombusComments +----------------+---------+-----------+---------+-----------+--------+ Common Iliac Mid patent              patent                      +----------------+---------+-----------+---------+-----------+--------+  +-----------------------+---------+-----------+---------+-----------+--------+           EIV          RT-PatentRT-ThrombusLT-PatentLT-ThrombusComments +-----------------------+---------+-----------+---------+-----------+--------+ External Iliac Vein Mid patent              patent                      +-----------------------+---------+-----------+---------+-----------+--------+   Summary: IVC/Iliac: No evidence of thrombus in common and external iliac veins. Unable to visualize IVC.  *See table(s) above for measurements and  observations.  Electronically signed by Ruta Hinds MD on 07/03/2020 at 6:20:18 PM.   Final    DG CHEST PORT 1 VIEW  Result Date:  07/19/2020 CLINICAL DATA:  Shortness of breath. EXAM: PORTABLE CHEST 1 VIEW COMPARISON:  November 9 21 FINDINGS: Similar cardiomediastinal silhouette. Postsurgical change with median sternotomy. Aortic atherosclerosis. Similar positioning of the right PICC. Enteric tube courses below the diaphragm and outside the field of view. Similar right basilar airspace opacities. No acute osseous abnormality. IMPRESSION: Similar right basilar airspace opacities, compatible with pneumonia and/or aspiration. Electronically Signed   By: Margaretha Sheffield MD   On: 07/19/2020 10:11   DG CHEST PORT 1 VIEW  Result Date: 07/18/2020 CLINICAL DATA:  Shortness of breath EXAM: PORTABLE CHEST 1 VIEW COMPARISON:  07/15/2020 FINDINGS: Cardiac shadow is stable. Postsurgical changes are noted. Aortic calcifications are seen. Feeding catheter extends at least into the distal stomach. Significant increased airspace opacity is noted in the right base consistent with acute pneumonia. No sizable effusion is seen. No bony abnormality is noted. IMPRESSION: Findings consistent with acute right basilar pneumonia. Electronically Signed   By: Inez Catalina M.D.   On: 07/18/2020 13:57   DG CHEST PORT 1 VIEW  Result Date: 07/15/2020 CLINICAL DATA:  Short of breath. EXAM: PORTABLE CHEST 1 VIEW COMPARISON:  07/13/2020 FINDINGS: Previous median sternotomy and CABG procedure. Aortic atherosclerotic calcifications. Mitral annular calcifications. Bilateral pleural effusions are noted, right greater than left. Pulmonary vascular congestion and trace edema. No airspace densities. IMPRESSION: 1. Mild congestive heart failure. 2.  Aortic Atherosclerosis (ICD10-I70.0). Electronically Signed   By: Kerby Moors M.D.   On: 07/15/2020 11:19   DG CHEST PORT 1 VIEW  Result Date: 07/13/2020 CLINICAL DATA:  Shortness of breath.  Weakness. EXAM: PORTABLE CHEST 1 VIEW COMPARISON:  07/01/2020 FINDINGS: Midline trachea. Mild cardiomegaly. Atherosclerosis in the  transverse aorta. Mitral annular calcifications. Median sternotomy for CABG. No pleural fluid. Biapical pleuroparenchymal scarring. No congestive failure. Mild scarring or atelectasis at the lateral left lung base. IMPRESSION: Cardiomegaly, without congestive failure. Aortic Atherosclerosis (ICD10-I70.0). Electronically Signed   By: Abigail Miyamoto M.D.   On: 07/13/2020 10:41   DG Abd Portable 1V  Result Date: 07/18/2020 CLINICAL DATA:  Constipation, check feeding catheter placement EXAM: PORTABLE ABDOMEN - 1 VIEW COMPARISON:  07/17/2019 FINDINGS: Feeding catheter is noted within the distal stomach/proximal duodenum. Contrast material is noted throughout the colon similar to that seen on the prior exam. Postsurgical changes are noted. No significant findings of constipation are seen. No free air is noted. IMPRESSION: Feeding catheter as described. No other focal abnormality is seen. Electronically Signed   By: Inez Catalina M.D.   On: 07/18/2020 13:58   DG Abd Portable 1V  Result Date: 07/16/2020 CLINICAL DATA:  Constipation EXAM: PORTABLE ABDOMEN - 1 VIEW COMPARISON:  CT dated July 12, 2020 FINDINGS: Oral contrast is noted in the colon. The bowel gas pattern is nonobstructive. There are degenerative changes of the spine. The patient is status post prior total hip arthroplasty. IMPRESSION: Oral contrast is noted in the colon. Nonobstructive bowel gas pattern. Electronically Signed   By: Constance Holster M.D.   On: 07/16/2020 18:57   DG Swallowing Func-Speech Pathology  Result Date: 07/13/2020 Objective Swallowing Evaluation: Type of Study: MBS-Modified Barium Swallow Study  Patient Details Name: PATRYCE DEPRIEST MRN: [id number] Date of Birth: 1934/11/14 Today's Date: 07/13/2020 Time: SLP Start Time (ACUTE ONLY): 1232 -SLP Stop Time (ACUTE ONLY): 1253 SLP Time Calculation (min) (ACUTE ONLY): 21  min Past Medical History: Past Medical History: Diagnosis Date . Anemia  . Anemia 06/14/2020 . Anxiety  . Arthritis   . Blood transfusion  . Chest pain   a. Lexiscan Myoview 12/12:  EF 75%, no ischemia or scar;  b.  Echo 12/12:  EF 60-65%, no sig valvular abnormalities  . Closed left fibular fracture   Casted  . Complication of anesthesia 1998  Unable to arouse fully, respiratory depression, pt. reports that the family was told that she would need a trach., but then she stabilized & didn't need it  . Coronary artery disease   sees Dr Aundra Dubin every 6 months . Depression  . Diabetes mellitus   Greater than 20 years type 2  . GERD (gastroesophageal reflux disease)  . H/O hiatal hernia  . Hypercholesteremia  . Hypertension   Greater than 20 years . Lumbar disc disease   S/p surgery x4;  Sciatica  . Neuropathy due to secondary diabetes ([place])   Hx: of . Parastomal hernia of ileal conduit 2009 . Pneumonia   s/p CABG . [name] (peripheral vascular disease) (H[facility])   Stable claudication. Arteriogram 07/2009 no significant aortoiliac disease, has bilateral SFA occlusions with distal reconstitution  . Rheumatoid arthritis ([place])   Dr Berna Bue @ [facility] . Transfusion reaction 1960  Convulsions (? seizure) . UTI (lower urinary tract infection) 01/03/15 Past Surgical History: Past Surgical History: Procedure Laterality Date . ABDOMINAL HYSTERECTOMY   . BACK SURGERY    4 back operations most recently 1995, 1998 . BIOPSY  06/17/2020  Procedure: BIOPSY;  Surgeon: Yetta Flock, MD;  Location: [facility] ENDOSCOPY;  Service: Gastroenterology;; . CARDIAC CATHETERIZATION    Hx: of 05/14/13 . CATARACT EXTRACTION W/ INTRAOCULAR LENS  IMPLANT, BILATERAL    Hx: of . COLONOSCOPY    Hx; of . COLONOSCOPY WITH PROPOFOL N/A 06/17/2020  Procedure: COLONOSCOPY WITH PROPOFOL;  Surgeon: Yetta Flock, MD;  Location: [facility] ENDOSCOPY;  Service: Gastroenterology;  Laterality: N/A; . CORONARY ARTERY BYPASS GRAFT N/A 05/24/2013  Procedure: CORONARY ARTERY BYPASS GRAFTING (CABG);  Surgeon: Melrose Nakayama, MD;  Location: [place];  Service: Open Heart Surgery;   Laterality: N/A; . DILATION AND CURETTAGE OF UTERUS   . ESOPHAGOGASTRODUODENOSCOPY (EGD) WITH PROPOFOL N/A 06/17/2020  Procedure: ESOPHAGOGASTRODUODENOSCOPY (EGD) WITH PROPOFOL;  Surgeon: Yetta Flock, MD;  Location: [place];  Service: Gastroenterology;  Laterality: N/A; . HEMOSTASIS CLIP PLACEMENT  06/17/2020  Procedure: HEMOSTASIS CLIP PLACEMENT;  Surgeon: Yetta Flock, MD;  Location: [facility] ENDOSCOPY;  Service: Gastroenterology;; . HERNIA REPAIR  2009 . LEFT HEART CATHETERIZATION WITH CORONARY ANGIOGRAM N/A 05/14/2013  Procedure: LEFT HEART CATHETERIZATION WITH CORONARY ANGIOGRAM;  Surgeon: Larey Dresser, MD;  [facility] C[facility] LAB;  Service: Cardiovascular;  Laterality: N/A; . POLYPECTOMY  06/17/2020  Procedure: POLYPECTOMY;  Surgeon: Yetta Flock, MD;  Location: [facility] ENDOSCOPY;  Service: Gastroenterology;; . TONSILLECTOMY   . TOTAL HIP ARTHROPLASTY Left 01/10/2015  Procedure: TOTAL HIP ARTHROPLASTY ANTERIOR APPROACH;  Surgeon: Melrose Nakayama, MD;  Location: [place];  Service: Orthopedics;  Laterality: Left; . TOTAL HIP ARTHROPLASTY Right 05/30/2020  Procedure: RIGHT TOTAL HIP ARTHROPLASTY ANTERIOR APPROACH;  Surgeon: Melrose Nakayama, MD;  Location: [facility] ORS;  Service: Orthopedics;  Laterality: Right; HPI: Pt is an 85 y.o. female with medical history significant of RA, [name], DM, HTN, [name], and CAD s/p CABG who presented with leg pain.  She had physical therapy sessions and became very weak came back to the hospital was found to have a bleeding ulcer  and a DVT. Per neurology's note, pt has had an insidious slow progressive onset of weakness for 3-4 weeks concerning for myopathy. SLP was consulted since dysphagia may be related to myopathy.  No data recorded Assessment / Plan / Recommendation CHL IP CLINICAL IMPRESSIONS 07/13/2020 Clinical Impression Pt's positioning was suboptimal due to her inability to hold her head in an upright position d/t reported weakness. Without physical assistance from SLP and  radiology tech, pt maintained a chin tuck posture. Pt exhibited difficulty with bolus propulsion, increased pharyngeal residue, and absent epiglottic inversion in this position, all increasing aspiration risk. Pt presented with oropharyngeal dysphagia characterized by reduced lingual retraction, reduced anterior laryngeal movement, reduced pharyngeal constriction, and aspiration (PAS 7) of nectar thick liquids secondary to spillover of pyriform sinus residue and delayed swallow initiation. Laryngeal sensation was adequate but coughing was ineffective. She demonstrated moderate vallecular residue, mild posterior pharyngeal wall residue, and mild-moderate pyriform sinus residue. A barium tablet was attempted, but pt refused stating that she needs her pills to be crushed since she is fearful of "choking" on them. Pt stated that she needs her meats to be cut "smaller than a fingernail" for her to be able to masticate them. Pt's diet will be downgraded to dysphagia 2 solids and thin liquids. 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 With support to keep her head in an upright position, her swallow function was improved and risk reduced. The results of the study and the potential use a soft cervical collar to help support pt's neck, and therefore facilitate use of an upright head position, were discussed with Dr. Posey Pronto and he indicated that he will order it. SLP will follow pt. SLP Visit Diagnosis Dysphagia, unspecified (R13.10) Attention and concentration deficit following -- Frontal lobe and executive function deficit following -- Impact on safety and function Mild aspiration risk   CHL IP TREATMENT RECOMMENDATION 07/13/2020 Treatment Recommendations Therapy as outlined in treatment plan below   Prognosis 07/13/2020 Prognosis for Safe Diet Advancement Good Barriers to Reach Goals Severity of  deficits Barriers/Prognosis Comment -- CHL IP DIET RECOMMENDATION 07/13/2020 SLP Diet Recommendations Dysphagia 2 (Fine chop) solids;Thin liquid Liquid Administration via Cup;Straw Medication Administration Crushed with puree Compensations Slow rate;Small sips/bites Postural Changes Seated upright at 90 degrees   CHL IP OTHER RECOMMENDATIONS 07/13/2020 Recommended Consults -- Oral Care Recommendations Oral care BID;Staff/trained caregiver to provide oral care Other Recommendations --   CHL IP FOLLOW UP RECOMMENDATIONS 07/13/2020 Follow up Recommendations (No Data)   CHL IP FREQUENCY AND DURATION 07/13/2020 Speech Therapy Frequency (ACUTE ONLY) min 2x/week Treatment Duration 2 weeks      CHL IP ORAL PHASE 07/13/2020 Oral Phase Impaired Oral - Pudding Teaspoon -- Oral - Pudding Cup -- Oral - Honey Teaspoon -- Oral - Honey Cup Reduced posterior propulsion Oral - Nectar Teaspoon -- Oral - Nectar Cup Reduced posterior propulsion Oral - Nectar Straw Reduced posterior propulsion Oral - Thin Teaspoon -- Oral - Thin Cup -- Oral - Thin Straw Reduced posterior propulsion Oral - Puree Reduced posterior propulsion Oral - Mech Soft Reduced posterior propulsion Oral - Regular Reduced posterior propulsion Oral - Multi-Consistency -- Oral - Pill -- Oral Phase - Comment --  CHL IP PHARYNGEAL PHASE 07/13/2020 Pharyngeal Phase Impaired Pharyngeal- Pudding Teaspoon -- Pharyngeal -- Pharyngeal- Pudding Cup -- Pharyngeal -- Pharyngeal- Honey Teaspoon -- Pharyngeal -- Pharyngeal- Honey Cup Pharyngeal residue -  valleculae;Pharyngeal residue - pyriform;Pharyngeal residue - posterior pharnyx;Reduced anterior laryngeal mobility;Reduced epiglottic inversion Pharyngeal -- Pharyngeal- Nectar Teaspoon -- Pharyngeal -- Pharyngeal- Nectar Cup -- Pharyngeal -- Pharyngeal- Nectar Straw Pharyngeal residue - valleculae;Pharyngeal residue - pyriform;Pharyngeal residue - posterior pharnyx;Reduced anterior laryngeal mobility;Reduced epiglottic inversion;Moderate  aspiration;Penetration/Aspiration during swallow;Penetration/Apiration after swallow Pharyngeal Material enters airway, passes BELOW cords and not ejected out despite cough attempt by patient Pharyngeal- Thin Teaspoon -- Pharyngeal -- Pharyngeal- Thin Cup -- Pharyngeal -- Pharyngeal- Thin Straw Pharyngeal residue - valleculae;Pharyngeal residue - pyriform;Pharyngeal residue - posterior pharnyx;Reduced anterior laryngeal mobility;Reduced epiglottic inversion Pharyngeal -- Pharyngeal- Puree Pharyngeal residue - valleculae;Pharyngeal residue - pyriform;Pharyngeal residue - posterior pharnyx;Reduced anterior laryngeal mobility;Reduced epiglottic inversion Pharyngeal -- Pharyngeal- Mechanical Soft Pharyngeal residue - valleculae;Pharyngeal residue - pyriform;Pharyngeal residue - posterior pharnyx;Reduced anterior laryngeal mobility;Reduced epiglottic inversion Pharyngeal -- Pharyngeal- Regular Pharyngeal residue - valleculae;Pharyngeal residue - pyriform;Pharyngeal residue - posterior pharnyx;Reduced anterior laryngeal mobility;Reduced epiglottic inversion Pharyngeal -- Pharyngeal- Multi-consistency -- Pharyngeal -- Pharyngeal- Pill -- Pharyngeal -- Pharyngeal Comment --  Shanika I. Hardin Negus, [place], [place] Office number [phone] Pager [phone] No flowsheet data found. Horton Marshall 07/13/2020, 3:11 PM              ECHOCARDIOGRAM COMPLETE  Result Date: 07/02/2020    ECHOCARDIOGRAM REPORT   Patient Name:   GERRE RANUM Date of Exam: 07/02/2020 Medical Rec #:  [id number]      Height:       62.0 in Accession #:    [id number]     Weight:       139.0 lb Date of Birth:  [birth year]      BSA:          1.638 m Patient Age:    72 years       BP:           94/49 mmHg Patient Gender: F              HR:           71 bpm. Exam Location:  Inpatient Procedure: 2D Echo Indications:    abnormal ecg 794.31  History:        Patient has prior history of Echocardiogram examinations, most                  recent 07/26/2019. CAD, Prior CABG, Signs/Symptoms:leg edema;                 Risk Factors:Diabetes and Dyslipidemia.  Sonographer:    Johny Chess Referring Phys: [id number] [place]  1. Left ventricular ejection fraction, by estimation, is 60 to 65%. The left ventricle has normal function. The left ventricle has no regional wall motion abnormalities. Left ventricular diastolic parameters are consistent with Grade I diastolic dysfunction (impaired relaxation).  2. Right ventricular systolic function is normal. The right ventricular size is normal. There is mildly elevated pulmonary artery systolic pressure.  3. Left atrial size was moderately dilated.  4. The mitral valve is degenerative. Moderate mitral valve regurgitation. Severe mitral annular calcification.  5. Tricuspid valve regurgitation is mild to moderate.  6. The aortic valve is tricuspid. There is mild calcification of the aortic valve. There is mild thickening of the aortic valve. Aortic valve regurgitation is trivial. Mild to moderate aortic valve sclerosis/calcification is present, without any evidence of aortic stenosis.  7. Aortic dilatation noted. There is mild dilatation of the ascending aorta, measuring 40 mm.  8. The inferior vena cava is normal in  size with greater than 50% respiratory variability, suggesting right atrial pressure of 3 mmHg. Comparison(s): Changes from prior study are noted. Conclusion(s)/Recommendation(s): Mitral regurgitation now appears more prominent than prior study. FINDINGS  Left Ventricle: Left ventricular ejection fraction, by estimation, is 60 to 65%. The left ventricle has normal function. The left ventricle has no regional wall motion abnormalities. The left ventricular internal cavity size was normal in size. There is  no left ventricular hypertrophy. Left ventricular diastolic parameters are consistent with Grade I diastolic dysfunction (impaired relaxation). Right Ventricle: The right  ventricular size is normal. No increase in right ventricular wall thickness. Right ventricular systolic function is normal. There is mildly elevated pulmonary artery systolic pressure. The tricuspid regurgitant velocity is 3.15  m/s, and with an assumed right atrial pressure of 3 mmHg, the estimated right ventricular systolic pressure is 24.2 mmHg. Left Atrium: Left atrial size was moderately dilated. Right Atrium: Right atrial size was normal in size. Pericardium: There is no evidence of pericardial effusion. Mitral Valve: The mitral valve is degenerative in appearance. There is mild thickening of the mitral valve leaflet(s). There is mild calcification of the mitral valve leaflet(s). Severe mitral annular calcification. Moderate mitral valve regurgitation. MV peak gradient, 13.4 mmHg. The mean mitral valve gradient is 6.0 mmHg. Tricuspid Valve: The tricuspid valve is normal in structure. Tricuspid valve regurgitation is mild to moderate. Aortic Valve: The aortic valve is tricuspid. There is mild calcification of the aortic valve. There is mild thickening of the aortic valve. Aortic valve regurgitation is trivial. Mild to moderate aortic valve sclerosis/calcification is present, without any evidence of aortic stenosis. Pulmonic Valve: The pulmonic valve was not well visualized. Pulmonic valve regurgitation is trivial. Aorta: Aortic dilatation noted. There is mild dilatation of the ascending aorta, measuring 40 mm. Venous: The inferior vena cava is normal in size with greater than 50% respiratory variability, suggesting right atrial pressure of 3 mmHg. IAS/Shunts: The atrial septum is grossly normal.  LEFT VENTRICLE PLAX 2D LVIDd:         3.60 cm  Diastology LVIDs:         2.60 cm  LV e' medial:    5.87 cm/s LV PW:         1.00 cm  LV E/e' medial:  18.7 LV IVS:        1.00 cm  LV e' lateral:   8.81 cm/s LVOT diam:     2.00 cm  LV E/e' lateral: 12.5 LV SV:         75 LV SV Index:   46 LVOT Area:     3.14 cm  RIGHT  VENTRICLE             IVC RV S prime:     12.80 cm/s  IVC diam: 1.40 cm TAPSE (M-mode): 1.8 cm LEFT ATRIUM             Index       RIGHT ATRIUM           Index LA diam:        3.90 cm 2.38 cm/m  RA Area:     12.40 cm LA Vol (A2C):   61.6 ml 37.61 ml/m RA Volume:   26.50 ml  16.18 ml/m LA Vol (A4C):   76.2 ml 46.53 ml/m LA Biplane Vol: 69.6 ml 42.50 ml/m  AORTIC VALVE LVOT Vmax:   93.80 cm/s LVOT Vmean:  60.000 cm/s LVOT VTI:    0.238 m  AORTA Ao Root diam:  3.10 cm Ao Asc diam:  4.00 cm MITRAL VALVE                TRICUSPID VALVE MV Area (PHT): 1.94 cm     TR Peak grad:   39.7 mmHg MV Peak grad:  13.4 mmHg    TR Vmax:        315.00 cm/s MV Mean grad:  6.0 mmHg MV Vmax:       1.83 m/s     SHUNTS MV Vmean:      113.0 cm/s   Systemic VTI:  0.24 m MV Decel Time: 391 msec     Systemic Diam: 2.00 cm MR Peak grad: 119.7 mmHg MR Mean grad: 78.5 mmHg MR Vmax:      547.00 cm/s MR Vmean:     422.0 cm/s MV E velocity: 110.00 cm/s MV A velocity: 153.00 cm/s MV E/A ratio:  0.72 Buford Dresser MD Electronically signed by Buford Dresser MD Signature Date/Time: 07/02/2020/11:36:02 AM    Final    US LIVER DOPPLER  Result Date: 07/13/2020 CLINICAL DATA:  Elevated LFTs EXAM: DUPLEX ULTRASOUND OF LIVER TECHNIQUE: Color and duplex Doppler ultrasound was performed to evaluate the hepatic in-flow and out-flow vessels. COMPARISON:  07/12/2020 FINDINGS: Liver: Normal parenchymal echogenicity. Normal hepatic contour without nodularity. No focal lesion, mass or intrahepatic biliary ductal dilatation. Main Portal Vein size: 1.0 cm Portal Vein Velocities Main Prox:  54 cm/sec Main Mid: 51 cm/sec Main Dist:  45 cm/sec Right: 34 cm/sec Left: 22 cm/sec Hepatic Vein Velocities Right:  22 cm/sec Middle:  32 cm/sec Left:  40 cm/sec IVC: Present and patent with normal respiratory phasicity. Hepatic Artery Velocity:  57 cm/sec Splenic Vein Velocity:  19 cm/sec Spleen: 2.6 cm x 6.8 cm x 7.4 cm with a total volume of 70 cm^3 (411  cm^3 is upper limit normal) Portal Vein Occlusion/Thrombus: No Splenic Vein Occlusion/Thrombus: No Ascites: None Varices: None Patent portal, hepatic and splenic veins with directional flow. Negative portal vein occlusion or thrombus. No ascites. Small left effusion noted. IMPRESSION: Normal hepatic venous Doppler. Electronically Signed   By: Jerilynn Mages.  Shick M.D.   On: 07/13/2020 08:24   DG FEMUR, MIN 2 VIEWS RIGHT  Result Date: 07/02/2020 CLINICAL DATA:  Fall EXAM: RIGHT FEMUR 2 VIEWS COMPARISON:  None. FINDINGS: The patient is status post right total hip arthroplasty. No periprosthetic lucency or fracture is seen. Overlying subcutaneous edema noted. Dense vascular calcifications are seen. IMPRESSION: Status post right total hip arthroplasty without acute complication. Electronically Signed   By: Prudencio Pair M.D.   On: 07/02/2020 00:20   VAS [place] LOWER EXTREMITY VENOUS (DVT)  Result Date: 07/12/2020  Lower Venous DVT Study Indications: Follow up dvt.  Comparison Study: 07/04/20 previous Performing Technologist: Abram Sander RVS  Examination Guidelines: A complete evaluation includes B-mode imaging, spectral Doppler, color Doppler, and power Doppler as needed of all accessible portions of each vessel. Bilateral testing is considered an integral part of a complete examination. Limited examinations for reoccurring indications may be performed as noted. The reflux portion of the exam is performed with the patient in reverse Trendelenburg.  +---------+---------------+---------+-----------+----------+--------------+ RIGHT    CompressibilityPhasicitySpontaneityPropertiesThrombus Aging +---------+---------------+---------+-----------+----------+--------------+ CFV      Full           Yes      Yes                                 +---------+---------------+---------+-----------+----------+--------------+  SFJ      Full                                                         +---------+---------------+---------+-----------+----------+--------------+ FV Prox  Full                                                        +---------+---------------+---------+-----------+----------+--------------+ FV Mid   Full                                                        +---------+---------------+---------+-----------+----------+--------------+ FV DistalFull                                                        +---------+---------------+---------+-----------+----------+--------------+ PFV      Full                                                        +---------+---------------+---------+-----------+----------+--------------+ POP      Full           Yes      Yes                                 +---------+---------------+---------+-----------+----------+--------------+ PTV      Full                                                        +---------+---------------+---------+-----------+----------+--------------+ PERO     Full                                                        +---------+---------------+---------+-----------+----------+--------------+   +---------+---------------+---------+-----------+----------+-----------------+ LEFT     CompressibilityPhasicitySpontaneityPropertiesThrombus Aging    +---------+---------------+---------+-----------+----------+-----------------+ CFV      Full           Yes      Yes                                    +---------+---------------+---------+-----------+----------+-----------------+ SFJ      Full                                                           +---------+---------------+---------+-----------+----------+-----------------+   FV Prox  Full                                                           +---------+---------------+---------+-----------+----------+-----------------+ FV Mid   Full                                                            +---------+---------------+---------+-----------+----------+-----------------+ FV DistalFull                                                           +---------+---------------+---------+-----------+----------+-----------------+ PFV      Full                                                           +---------+---------------+---------+-----------+----------+-----------------+ POP      Full           Yes      Yes                                    +---------+---------------+---------+-----------+----------+-----------------+ PTV      None                                         Age Indeterminate +---------+---------------+---------+-----------+----------+-----------------+ PERO     None                                         Age Indeterminate +---------+---------------+---------+-----------+----------+-----------------+ Gastroc  None                                         Age Indeterminate +---------+---------------+---------+-----------+----------+-----------------+     Summary: RIGHT: - There is no evidence of deep vein thrombosis in the lower extremity.  - No cystic structure found in the popliteal fossa.  LEFT: - Findings consistent with age indeterminate deep vein thrombosis involving the left posterior tibial veins, left peroneal veins, and left gastrocnemius veins. - No cystic structure found in the popliteal fossa.  *See table(s) above for measurements and observations. Electronically signed by Curt Jews MD on 07/12/2020 at 3:05:07 PM.    Final    VAS [place] LOWER EXTREMITY VENOUS (DVT)  Result Date: 07/04/2020  Lower Venous DVTStudy Indications: Pain, and Swelling.  Limitations: Poor ultrasound/tissue interface. Comparison Study: 06/09/2020- negative lower extremity venosu duplex Performing Technologist: Maudry Mayhew MHA, RDMS, RVT, RDCS  Examination Guidelines: A complete evaluation includes B-mode imaging, spectral Doppler, color Doppler,  and power  Doppler as needed of all accessible portions of each vessel. Bilateral testing is considered an integral part of a complete examination. Limited examinations for reoccurring indications may be performed as noted. The reflux portion of the exam is performed with the patient in reverse Trendelenburg.  +---------+---------------+---------+-----------+----------+--------------+ RIGHT    CompressibilityPhasicitySpontaneityPropertiesThrombus Aging +---------+---------------+---------+-----------+----------+--------------+ CFV      Full           Yes      Yes                                 +---------+---------------+---------+-----------+----------+--------------+ SFJ      Full                                                        +---------+---------------+---------+-----------+----------+--------------+ FV Prox  Full                                                        +---------+---------------+---------+-----------+----------+--------------+ FV Mid   Full                                                        +---------+---------------+---------+-----------+----------+--------------+ FV DistalFull                                                        +---------+---------------+---------+-----------+----------+--------------+ PFV      Full                                                        +---------+---------------+---------+-----------+----------+--------------+ POP      Full           Yes      Yes                                 +---------+---------------+---------+-----------+----------+--------------+ PTV      Full                                                        +---------+---------------+---------+-----------+----------+--------------+   Right Technical Findings: Not visualized segments include peroneal veins.  +---------+---------------+---------+-----------+---------------+--------------+ LEFT      CompressibilityPhasicitySpontaneityProperties     Thrombus Aging +---------+---------------+---------+-----------+---------------+--------------+ CFV      Full           Yes      Yes                                      +---------+---------------+---------+-----------+---------------+--------------+  SFJ      Full                                                             +---------+---------------+---------+-----------+---------------+--------------+ FV Prox  Full                                                             +---------+---------------+---------+-----------+---------------+--------------+ FV Mid   Full                                                             +---------+---------------+---------+-----------+---------------+--------------+ FV Distal                        Yes        Patent by color                                                           Doppler                       +---------+---------------+---------+-----------+---------------+--------------+ PFV      Full                                                             +---------+---------------+---------+-----------+---------------+--------------+ POP      Full           Yes      Yes                                      +---------+---------------+---------+-----------+---------------+--------------+ PTV      None                    No                        Acute          +---------+---------------+---------+-----------+---------------+--------------+ PERO     None                    No                        Acute          +---------+---------------+---------+-----------+---------------+--------------+ Gastroc  None                    No  Acute          +---------+---------------+---------+-----------+---------------+--------------+     Summary: RIGHT: - There is no evidence of deep vein thrombosis in the lower extremity.  However, portions of this examination were limited- see technologist comments above.  - No cystic structure found in the popliteal fossa.  LEFT: - Findings consistent with acute deep vein thrombosis involving the left posterior tibial veins, left peroneal veins, and left gastrocnemius veins. - No cystic structure found in the popliteal fossa.  *See table(s) above for measurements and observations. Electronically signed by Deitra Mayo MD on 07/04/2020 at 5:41:20 PM.    Final    [place] EKG SITE RITE  Result Date: 07/15/2020 If Site Rite image not attached, placement could not be confirmed due to current cardiac rhythm.   Microbiology: No results found for this or any previous visit (from the past 240 hour(s)).   Labs: Basic Metabolic Panel: No results for input(s): NA, K, CL, CO2, GLUCOSE, BUN, CREATININE, CALCIUM, MG, PHOS in the last 168 hours. Liver Function Tests: No results for input(s): AST, ALT, ALKPHOS, BILITOT, PROT, ALBUMIN in the last 168 hours. No results for input(s): LIPASE, AMYLASE in the last 168 hours. No results for input(s): AMMONIA in the last 168 hours. CBC: No results for input(s): WBC, NEUTROABS, HGB, HCT, MCV, PLT in the last 168 hours. Cardiac Enzymes: No results for input(s): CKTOTAL, CKMB, CKMBINDEX, TROPONINI in the last 168 hours. BNP: BNP (last 3 results) Recent Labs    06/08/20 1630 06/14/20 1351 07/02/20 0415  BNP 177.6* 171.3* 297.0*    ProBNP (last 3 results) Recent Labs    08/31/19 1052  PROBNP 980*    CBG: No results for input(s): GLUCAP in the last 168 hours.    Time spent: 20 minutes  Signed:  Para Skeans MD.  [facility] 2020/08/06, 6:42 PM

## 2020-08-09 DEATH — deceased

## 2021-11-03 IMAGING — CR DG CHEST 2V
2 series · 2 of 2 positions shown · non-contrast
Comparison: December 29, 2014

CLINICAL DATA: Chest pain.  Shortness of breath.

EXAM:
CHEST - 2 VIEW

[w chest pa]
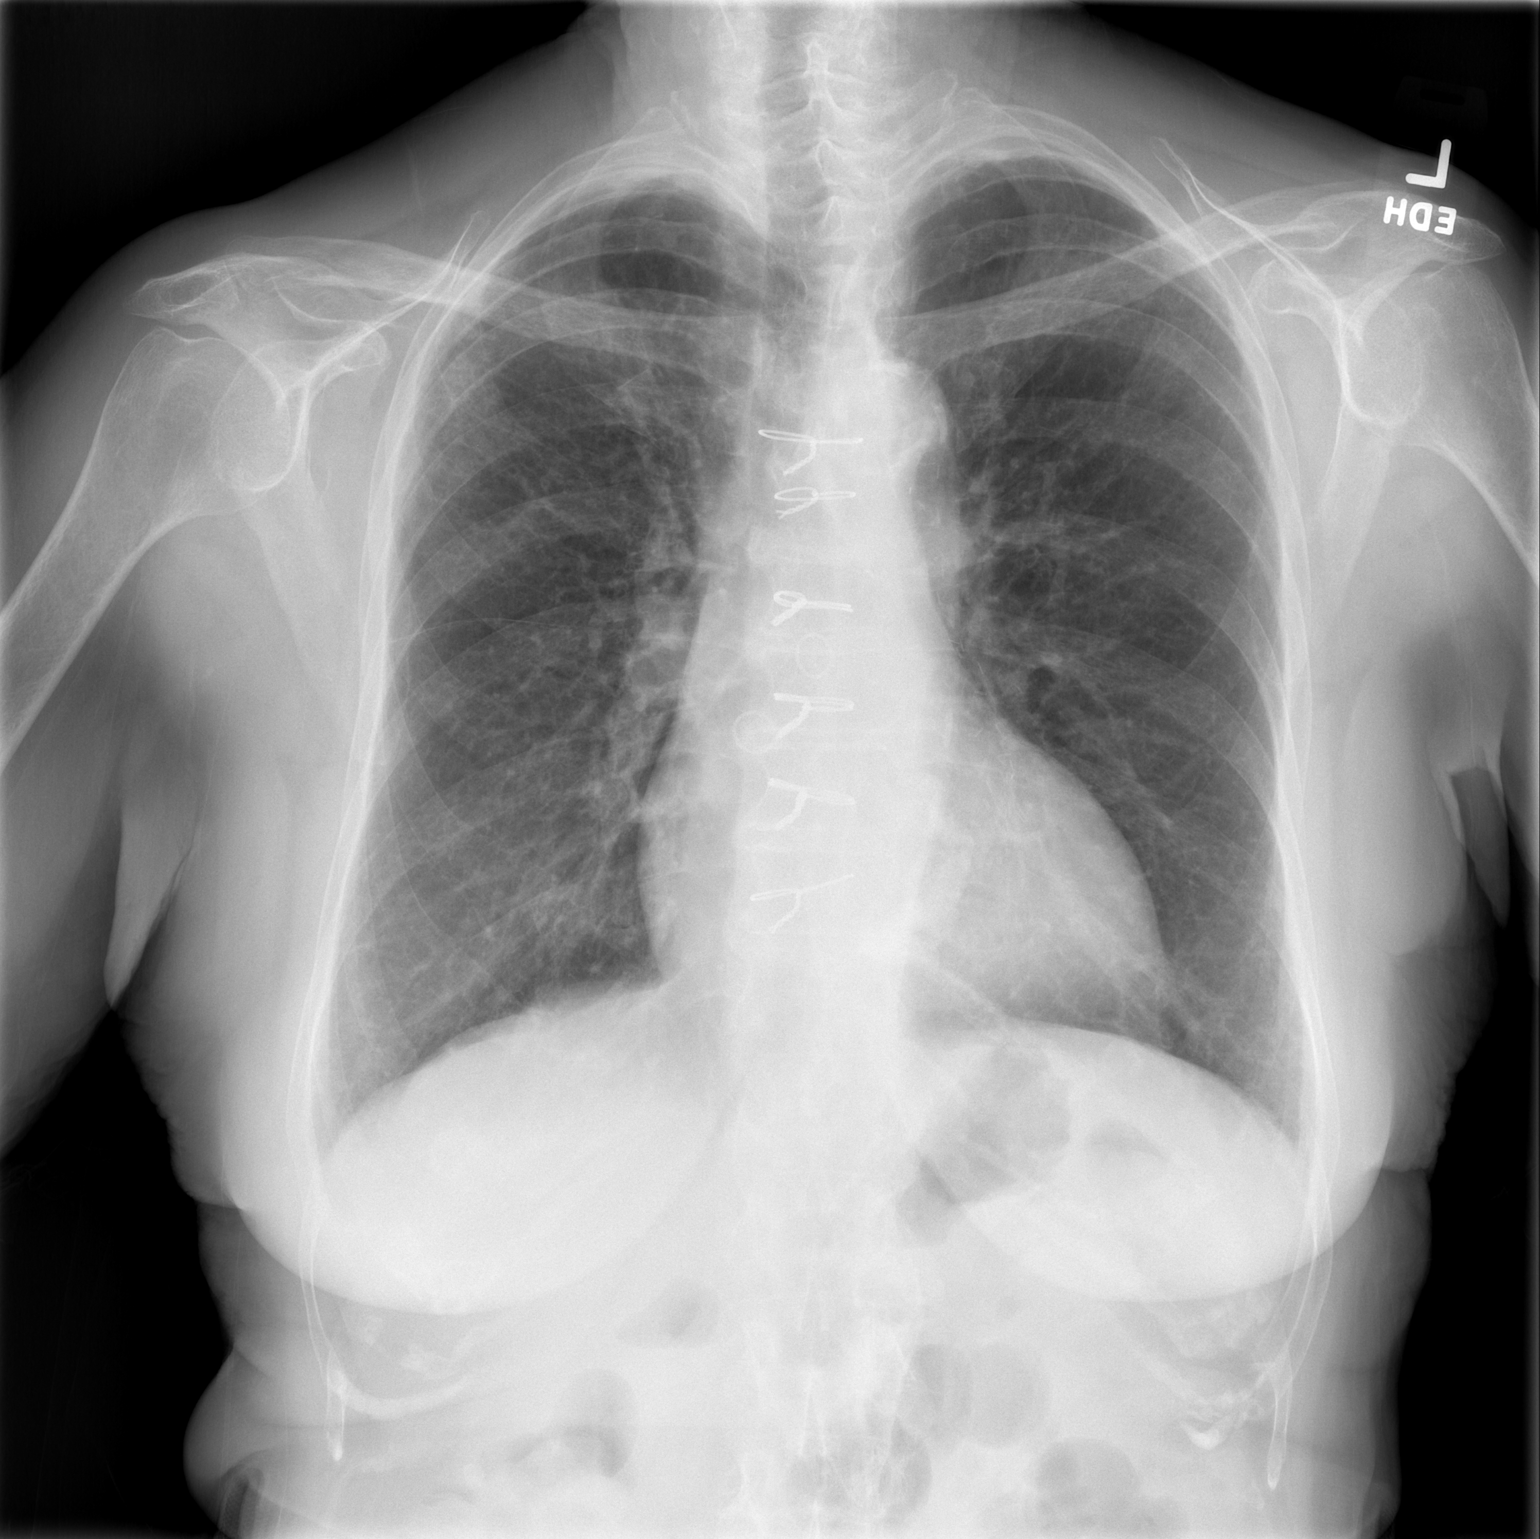

[w chest lat]
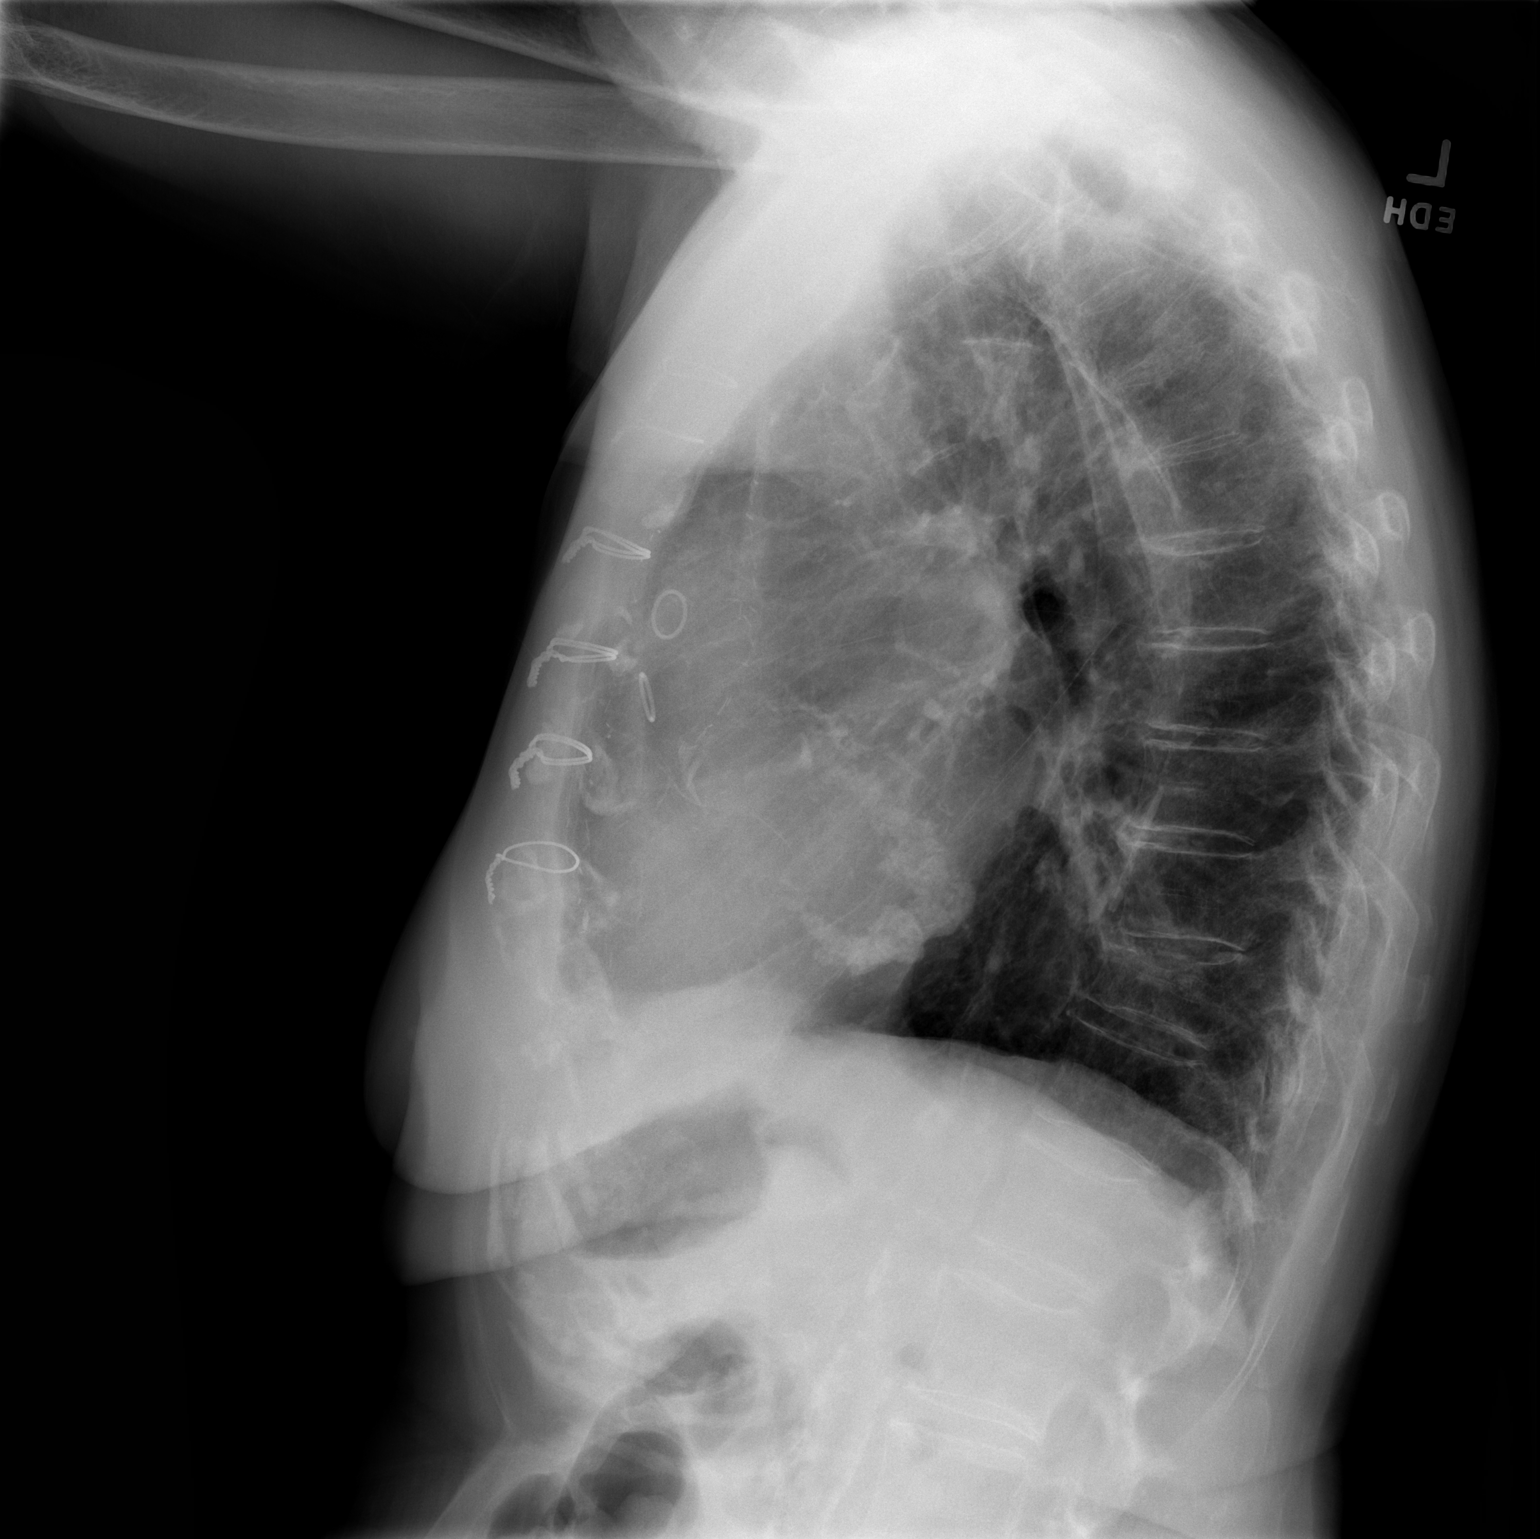

[2 of 2 positions shown; findings below may reference images not displayed]

FINDINGS: The heart size and mediastinal contours are within normal limits.
Both lungs are clear. The visualized skeletal structures are
unremarkable.
IMPRESSION: No active cardiopulmonary disease.

## 2022-09-22 IMAGING — DX DG CHEST 2V
2 series · 2 of 2 positions shown · non-contrast
Comparison: Prior chest radiographs 07/21/2019 and earlier

CLINICAL DATA: Shortness of breath. Additional history provided:
Patient reports central chest pain, weakness and shortness of breath
for 1 day.

EXAM:
CHEST - 2 VIEW

[chest pa]
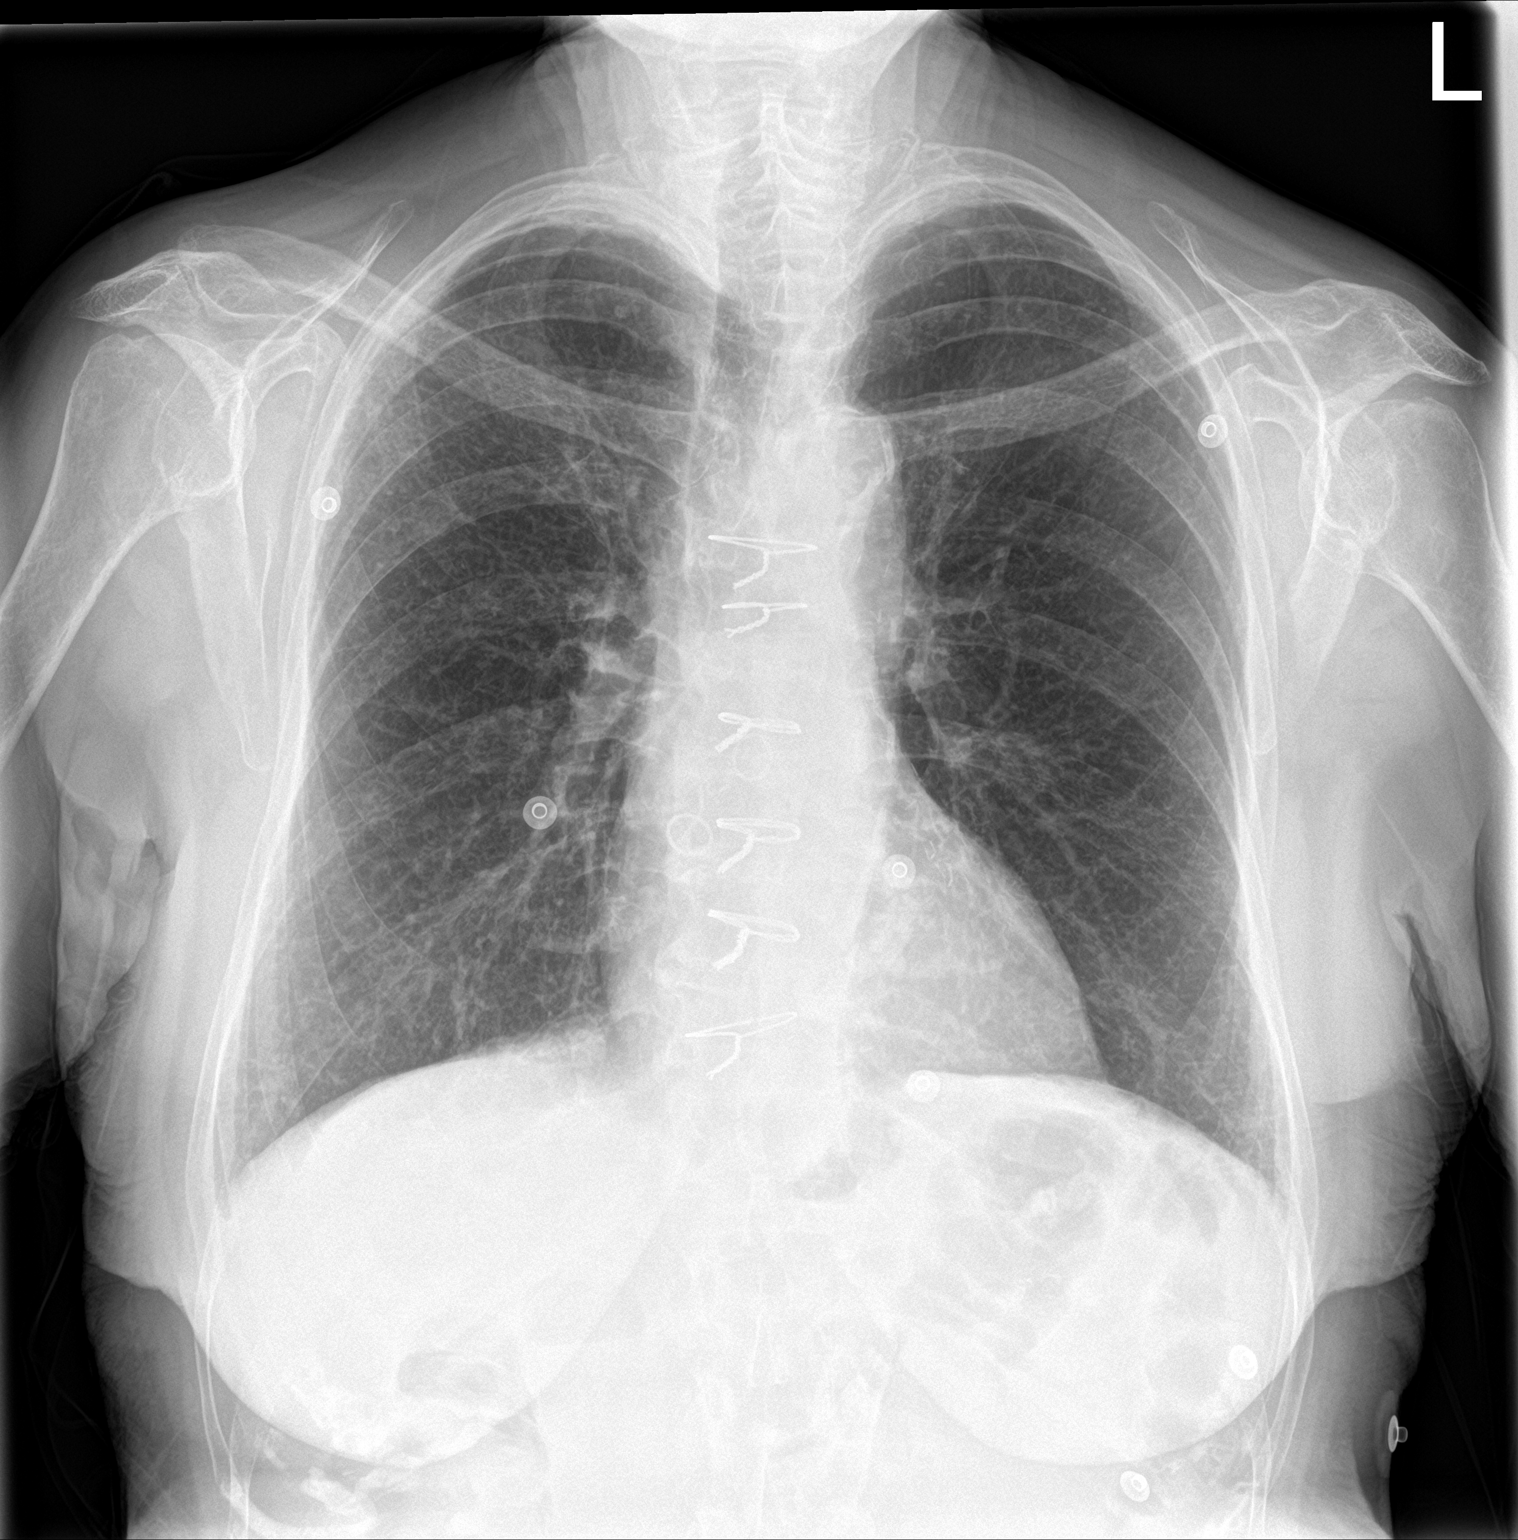

[chest lat]
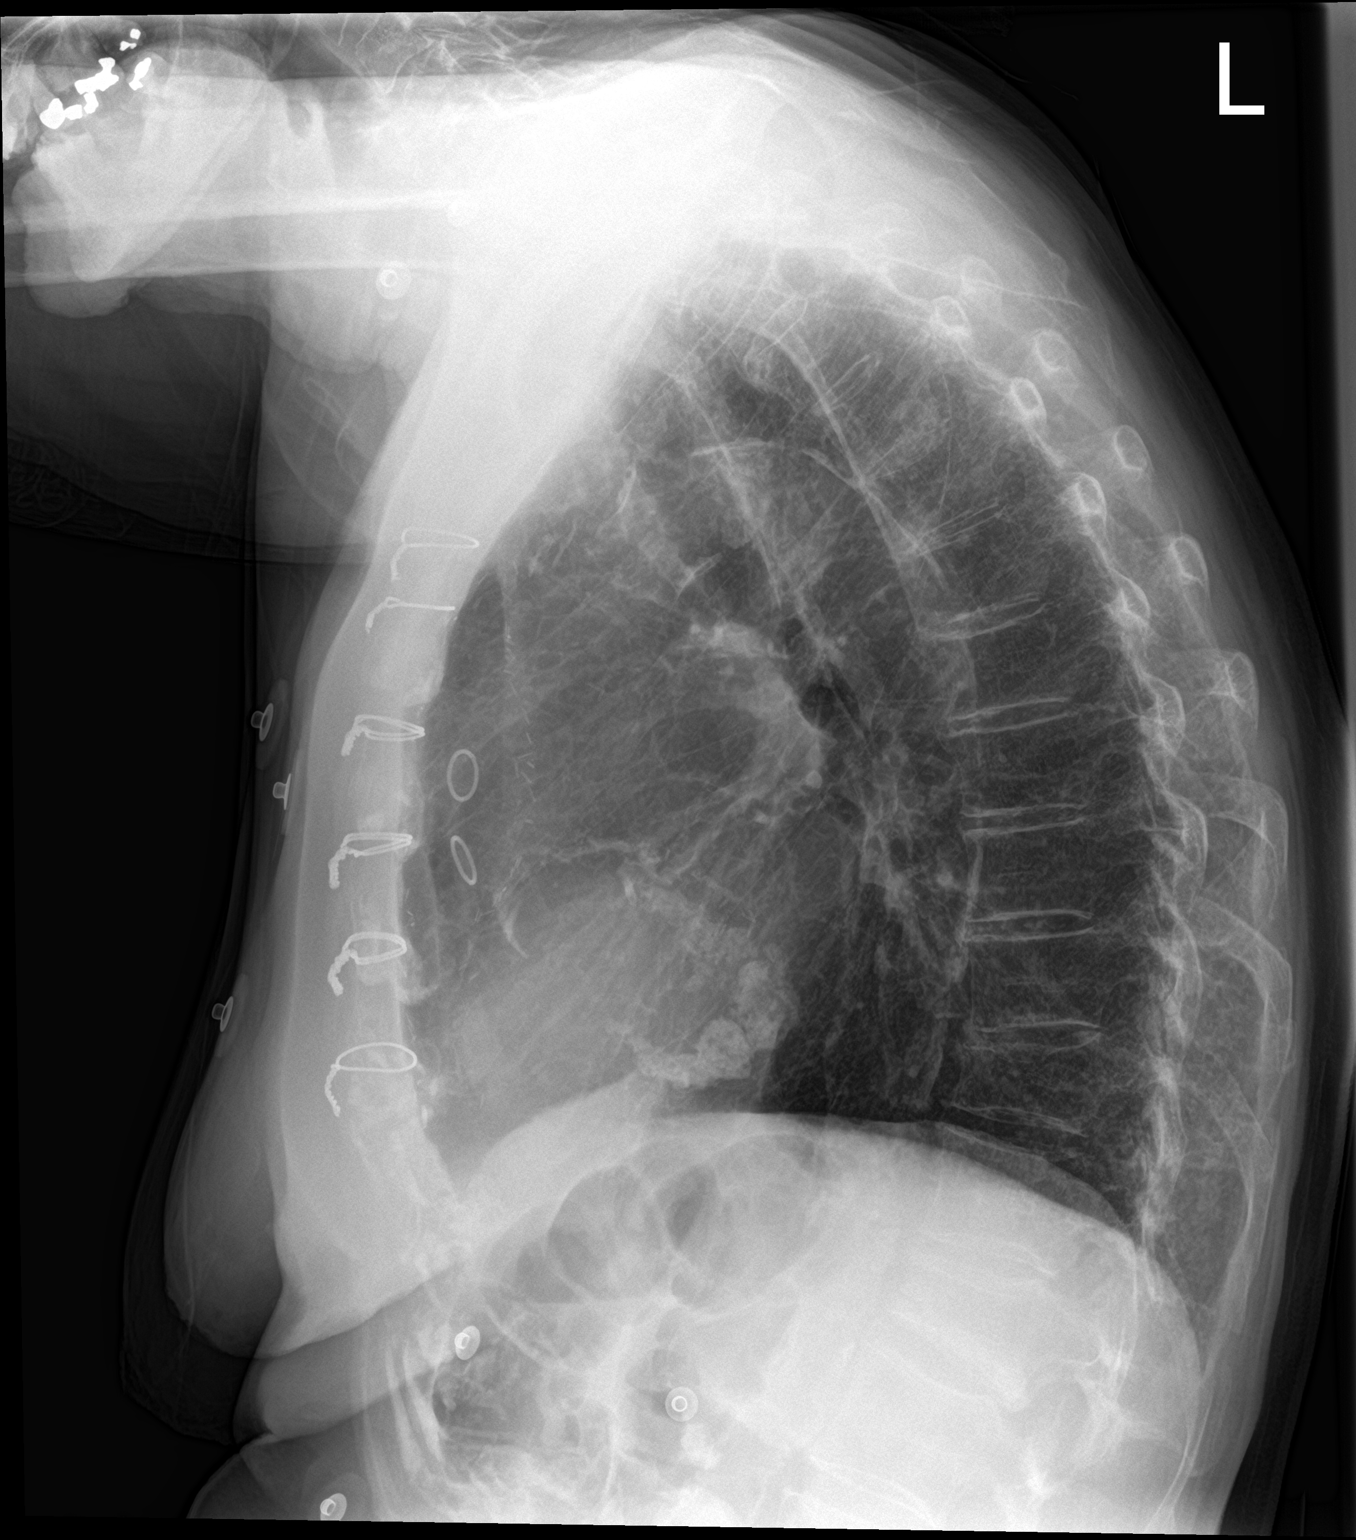

[2 of 2 positions shown; findings below may reference images not displayed]

FINDINGS: Prior median sternotomy and CABG. Heart size within normal limits.
Aortic atherosclerosis. No appreciable airspace consolidation.
Chronic prominence of the interstitial lung markings. No evidence of
pleural effusion or pneumothorax. No acute bony abnormality
identified.
IMPRESSION: No evidence of acute cardiopulmonary abnormality.

Redemonstrated chronic prominence of the interstitial lung markings.

Prior CABG.

Aortic Atherosclerosis ([id number]).
